# Patient Record
Sex: Female | Born: 1935 | Race: White | Hispanic: No | Marital: Married | Smoking: Former smoker
Health system: Southern US, Community
[De-identification: ages and names within clinical notes are randomized; demographics above are authoritative.]

## PROBLEM LIST (undated history)

## (undated) DIAGNOSIS — J189 Pneumonia, unspecified organism: Secondary | ICD-10-CM

## (undated) DIAGNOSIS — R062 Wheezing: Secondary | ICD-10-CM

## (undated) DIAGNOSIS — R609 Edema, unspecified: Secondary | ICD-10-CM

## (undated) DIAGNOSIS — N189 Chronic kidney disease, unspecified: Secondary | ICD-10-CM

## (undated) DIAGNOSIS — I499 Cardiac arrhythmia, unspecified: Secondary | ICD-10-CM

## (undated) DIAGNOSIS — R519 Headache, unspecified: Secondary | ICD-10-CM

## (undated) DIAGNOSIS — M1990 Unspecified osteoarthritis, unspecified site: Secondary | ICD-10-CM

## (undated) DIAGNOSIS — E119 Type 2 diabetes mellitus without complications: Secondary | ICD-10-CM

## (undated) DIAGNOSIS — IMO0001 Reserved for inherently not codable concepts without codable children: Secondary | ICD-10-CM

## (undated) DIAGNOSIS — I509 Heart failure, unspecified: Secondary | ICD-10-CM

## (undated) DIAGNOSIS — I10 Essential (primary) hypertension: Secondary | ICD-10-CM

## (undated) DIAGNOSIS — R51 Headache: Secondary | ICD-10-CM

## (undated) DIAGNOSIS — T4145XA Adverse effect of unspecified anesthetic, initial encounter: Secondary | ICD-10-CM

## (undated) DIAGNOSIS — E7800 Pure hypercholesterolemia, unspecified: Secondary | ICD-10-CM

## (undated) DIAGNOSIS — M539 Dorsopathy, unspecified: Secondary | ICD-10-CM

## (undated) DIAGNOSIS — J449 Chronic obstructive pulmonary disease, unspecified: Secondary | ICD-10-CM

## (undated) DIAGNOSIS — I4891 Unspecified atrial fibrillation: Secondary | ICD-10-CM

## (undated) DIAGNOSIS — T8859XA Other complications of anesthesia, initial encounter: Secondary | ICD-10-CM

## (undated) DIAGNOSIS — J45909 Unspecified asthma, uncomplicated: Secondary | ICD-10-CM

## (undated) HISTORY — PX: TOTAL HIP ARTHROPLASTY: SHX124

## (undated) HISTORY — PX: ABDOMINAL HYSTERECTOMY: SHX81

## (undated) HISTORY — PX: SHOULDER FUSION SURGERY: SHX775

## (undated) HISTORY — DX: Chronic obstructive pulmonary disease, unspecified: J44.9

## (undated) HISTORY — PX: CATARACT EXTRACTION: SUR2

## (undated) HISTORY — PX: BACK SURGERY: SHX140

## (undated) HISTORY — PX: TOTAL KNEE ARTHROPLASTY: SHX125

## (undated) HISTORY — PX: FRACTURE SURGERY: SHX138

## (undated) HISTORY — DX: Unspecified atrial fibrillation: I48.91

## (undated) HISTORY — DX: Type 2 diabetes mellitus without complications: E11.9

## (undated) HISTORY — PX: COLONOSCOPY: SHX174

## (undated) HISTORY — PX: HERNIA REPAIR: SHX51

## (undated) HISTORY — PX: JOINT REPLACEMENT: SHX530

---

## 2004-07-22 ENCOUNTER — Ambulatory Visit: Payer: Self-pay | Admitting: Internal Medicine

## 2005-02-07 ENCOUNTER — Ambulatory Visit: Payer: Self-pay | Admitting: Physician Assistant

## 2005-03-16 ENCOUNTER — Ambulatory Visit: Payer: Self-pay | Admitting: Anesthesiology

## 2005-03-17 ENCOUNTER — Ambulatory Visit: Payer: Self-pay | Admitting: Anesthesiology

## 2005-04-07 ENCOUNTER — Ambulatory Visit: Payer: Self-pay | Admitting: Anesthesiology

## 2005-05-03 ENCOUNTER — Ambulatory Visit: Payer: Self-pay | Admitting: Anesthesiology

## 2005-06-14 ENCOUNTER — Ambulatory Visit: Payer: Self-pay | Admitting: Anesthesiology

## 2005-08-24 ENCOUNTER — Ambulatory Visit: Payer: Self-pay | Admitting: Anesthesiology

## 2005-09-08 ENCOUNTER — Ambulatory Visit: Payer: Self-pay | Admitting: Internal Medicine

## 2006-02-21 ENCOUNTER — Ambulatory Visit: Payer: Self-pay | Admitting: Anesthesiology

## 2006-06-26 ENCOUNTER — Ambulatory Visit: Payer: Self-pay | Admitting: Anesthesiology

## 2006-07-10 ENCOUNTER — Ambulatory Visit: Payer: Self-pay | Admitting: Anesthesiology

## 2006-09-11 ENCOUNTER — Ambulatory Visit: Payer: Self-pay | Admitting: Internal Medicine

## 2006-12-21 ENCOUNTER — Ambulatory Visit: Payer: Self-pay | Admitting: Anesthesiology

## 2007-01-17 ENCOUNTER — Ambulatory Visit: Payer: Self-pay | Admitting: Anesthesiology

## 2007-03-06 ENCOUNTER — Ambulatory Visit: Payer: Self-pay | Admitting: Anesthesiology

## 2007-07-03 ENCOUNTER — Ambulatory Visit: Payer: Self-pay | Admitting: Internal Medicine

## 2007-08-13 ENCOUNTER — Ambulatory Visit: Payer: Self-pay | Admitting: Internal Medicine

## 2007-09-17 ENCOUNTER — Ambulatory Visit: Payer: Self-pay | Admitting: Internal Medicine

## 2007-10-03 ENCOUNTER — Ambulatory Visit: Payer: Self-pay | Admitting: Anesthesiology

## 2008-01-14 ENCOUNTER — Ambulatory Visit: Payer: Self-pay | Admitting: Anesthesiology

## 2008-04-01 ENCOUNTER — Ambulatory Visit: Payer: Self-pay | Admitting: Anesthesiology

## 2008-06-12 ENCOUNTER — Ambulatory Visit: Payer: Self-pay | Admitting: Anesthesiology

## 2008-08-25 ENCOUNTER — Ambulatory Visit: Payer: Self-pay | Admitting: Anesthesiology

## 2008-09-18 ENCOUNTER — Ambulatory Visit: Payer: Self-pay | Admitting: Internal Medicine

## 2008-10-22 ENCOUNTER — Ambulatory Visit: Payer: Self-pay | Admitting: Anesthesiology

## 2009-01-02 ENCOUNTER — Ambulatory Visit: Payer: Self-pay | Admitting: Anesthesiology

## 2009-04-04 ENCOUNTER — Emergency Department: Payer: Self-pay | Admitting: Emergency Medicine

## 2009-05-12 ENCOUNTER — Ambulatory Visit: Payer: Self-pay | Admitting: Anesthesiology

## 2009-07-17 ENCOUNTER — Ambulatory Visit: Payer: Self-pay | Admitting: Anesthesiology

## 2009-10-02 ENCOUNTER — Ambulatory Visit: Payer: Self-pay | Admitting: Anesthesiology

## 2009-10-03 ENCOUNTER — Ambulatory Visit: Payer: Self-pay | Admitting: Internal Medicine

## 2009-10-28 ENCOUNTER — Ambulatory Visit: Payer: Self-pay | Admitting: Internal Medicine

## 2009-11-05 ENCOUNTER — Inpatient Hospital Stay (HOSPITAL_COMMUNITY): Admission: RE | Admit: 2009-11-05 | Discharge: 2009-11-13 | Payer: Self-pay | Admitting: Neurosurgery

## 2009-11-10 ENCOUNTER — Ambulatory Visit: Payer: Self-pay | Admitting: Physical Medicine & Rehabilitation

## 2009-11-13 ENCOUNTER — Inpatient Hospital Stay (HOSPITAL_COMMUNITY)
Admission: RE | Admit: 2009-11-13 | Discharge: 2009-11-24 | Payer: Self-pay | Admitting: Physical Medicine & Rehabilitation

## 2009-11-21 ENCOUNTER — Ambulatory Visit: Payer: Self-pay | Admitting: Physical Medicine & Rehabilitation

## 2010-11-03 LAB — BASIC METABOLIC PANEL
BUN: 13 mg/dL (ref 6–23)
BUN: 25 mg/dL — ABNORMAL HIGH (ref 6–23)
Chloride: 101 mEq/L (ref 96–112)
Chloride: 99 mEq/L (ref 96–112)
Creatinine, Ser: 0.88 mg/dL (ref 0.4–1.2)
GFR calc non Af Amer: >60 mL/min (ref >60)
Glucose, Bld: 93 mg/dL (ref 70–99)

## 2010-11-03 LAB — URINALYSIS, ROUTINE W REFLEX MICROSCOPIC
Bilirubin Urine: NEGATIVE
Glucose, UA: NEGATIVE mg/dL
Hgb urine dipstick: NEGATIVE
Nitrite: NEGATIVE
Specific Gravity, Urine: 1.011 (ref 1.005–1.030)
Urobilinogen, UA: 1 mg/dL (ref 0.0–1.0)

## 2010-11-03 LAB — DIFFERENTIAL
Basophils Absolute: 0 10*3/uL (ref 0.0–0.1)
Basophils Relative: 1 % (ref 0–1)
Eosinophils Absolute: 0.3 10*3/uL (ref 0.0–0.7)
Eosinophils Relative: 5 % (ref 0–5)
Lymphocytes Relative: 20 % (ref 12–46)
Lymphs Abs: 1.4 10*3/uL (ref 0.7–4.0)
Monocytes Absolute: 0.5 10*3/uL (ref 0.1–1.0)
Monocytes Relative: 7 % (ref 3–12)

## 2010-11-03 LAB — URINE CULTURE

## 2010-11-03 LAB — COMPREHENSIVE METABOLIC PANEL
Alkaline Phosphatase: 99 U/L (ref 39–117)
CO2: 25 mEq/L (ref 19–32)
Chloride: 100 mEq/L (ref 96–112)
GFR calc Af Amer: >60 mL/min (ref >60)
GFR calc non Af Amer: >60 mL/min (ref >60)

## 2010-11-03 LAB — CBC: Platelets: 367 10*3/uL (ref 150–400)

## 2010-11-08 LAB — BASIC METABOLIC PANEL
BUN: 14 mg/dL (ref 6–23)
BUN: 14 mg/dL (ref 6–23)
BUN: 15 mg/dL (ref 6–23)
BUN: 28 mg/dL — ABNORMAL HIGH (ref 6–23)
CO2: 25 mEq/L (ref 19–32)
CO2: 26 mEq/L (ref 19–32)
CO2: 26 mEq/L (ref 19–32)
CO2: 28 mEq/L (ref 19–32)
Calcium: 10.1 mg/dL (ref 8.4–10.5)
Calcium: 7.8 mg/dL — ABNORMAL LOW (ref 8.4–10.5)
Calcium: 8.5 mg/dL (ref 8.4–10.5)
Calcium: 8.7 mg/dL (ref 8.4–10.5)
Chloride: 102 mEq/L (ref 96–112)
Chloride: 103 mEq/L (ref 96–112)
Chloride: 104 mEq/L (ref 96–112)
Chloride: 97 mEq/L (ref 96–112)
Creatinine, Ser: 0.74 mg/dL (ref 0.4–1.2)
Creatinine, Ser: 0.8 mg/dL (ref 0.4–1.2)
Creatinine, Ser: 0.89 mg/dL (ref 0.4–1.2)
Creatinine, Ser: 1.1 mg/dL (ref 0.4–1.2)
GFR calc Af Amer: 59 mL/min — ABNORMAL LOW (ref >60)
GFR calc Af Amer: >60 mL/min (ref >60)
GFR calc Af Amer: >60 mL/min (ref >60)
GFR calc Af Amer: >60 mL/min (ref >60)
GFR calc non Af Amer: 49 mL/min — ABNORMAL LOW (ref >60)
GFR calc non Af Amer: >60 mL/min (ref >60)
GFR calc non Af Amer: >60 mL/min (ref >60)
GFR calc non Af Amer: >60 mL/min (ref >60)
Glucose, Bld: 101 mg/dL — ABNORMAL HIGH (ref 70–99)
Glucose, Bld: 108 mg/dL — ABNORMAL HIGH (ref 70–99)
Glucose, Bld: 127 mg/dL — ABNORMAL HIGH (ref 70–99)
Glucose, Bld: 81 mg/dL (ref 70–99)
Potassium: 3.3 mEq/L — ABNORMAL LOW (ref 3.5–5.1)
Potassium: 3.6 mEq/L (ref 3.5–5.1)
Potassium: 4.1 mEq/L (ref 3.5–5.1)
Potassium: 4.3 mEq/L (ref 3.5–5.1)
Sodium: 131 mEq/L — ABNORMAL LOW (ref 135–145)
Sodium: 135 mEq/L (ref 135–145)
Sodium: 135 mEq/L (ref 135–145)
Sodium: 136 mEq/L (ref 135–145)

## 2010-11-08 LAB — URINE CULTURE
Colony Count: NO GROWTH
Culture: NO GROWTH

## 2010-11-08 LAB — CBC
HCT: 21.7 % — ABNORMAL LOW (ref 36.0–46.0)
HCT: 26 % — ABNORMAL LOW (ref 36.0–46.0)
HCT: 26.9 % — ABNORMAL LOW (ref 36.0–46.0)
HCT: 27.8 % — ABNORMAL LOW (ref 36.0–46.0)
HCT: 30.3 % — ABNORMAL LOW (ref 36.0–46.0)
HCT: 36.8 % (ref 36.0–46.0)
Hemoglobin: 10.5 g/dL — ABNORMAL LOW (ref 12.0–15.0)
Hemoglobin: 13 g/dL (ref 12.0–15.0)
Hemoglobin: 9.5 g/dL — ABNORMAL LOW (ref 12.0–15.0)
Hemoglobin: 9.7 g/dL — ABNORMAL LOW (ref 12.0–15.0)
MCHC: 34.7 g/dL (ref 30.0–36.0)
MCHC: 35 g/dL (ref 30.0–36.0)
MCHC: 35.2 g/dL (ref 30.0–36.0)
MCHC: 35.2 g/dL (ref 30.0–36.0)
MCHC: 35.2 g/dL (ref 30.0–36.0)
MCHC: 35.4 g/dL (ref 30.0–36.0)
MCV: 95.3 fL (ref 78.0–100.0)
MCV: 95.5 fL (ref 78.0–100.0)
MCV: 96.6 fL (ref 78.0–100.0)
MCV: 98 fL (ref 78.0–100.0)
MCV: 98.9 fL (ref 78.0–100.0)
MCV: 99.2 fL (ref 78.0–100.0)
Platelets: 162 10*3/uL (ref 150–400)
Platelets: 182 10*3/uL (ref 150–400)
Platelets: 186 10*3/uL (ref 150–400)
Platelets: 208 10*3/uL (ref 150–400)
Platelets: 259 10*3/uL (ref 150–400)
Platelets: 271 10*3/uL (ref 150–400)
RBC: 2.11 MIL/uL — ABNORMAL LOW (ref 3.87–5.11)
RBC: 2.73 MIL/uL — ABNORMAL LOW (ref 3.87–5.11)
RBC: 2.92 MIL/uL — ABNORMAL LOW (ref 3.87–5.11)
RBC: 3.13 MIL/uL — ABNORMAL LOW (ref 3.87–5.11)
RBC: 3.72 MIL/uL — ABNORMAL LOW (ref 3.87–5.11)
RDW: 13.1 % (ref 11.5–15.5)
RDW: 13.4 % (ref 11.5–15.5)
RDW: 13.6 % (ref 11.5–15.5)
RDW: 13.7 % (ref 11.5–15.5)
RDW: 14.7 % (ref 11.5–15.5)
RDW: 14.9 % (ref 11.5–15.5)
RDW: 15.2 % (ref 11.5–15.5)
WBC: 6.3 10*3/uL (ref 4.0–10.5)
WBC: 6.4 10*3/uL (ref 4.0–10.5)
WBC: 6.6 10*3/uL (ref 4.0–10.5)
WBC: 7.9 10*3/uL (ref 4.0–10.5)
WBC: 9.3 10*3/uL (ref 4.0–10.5)
WBC: 9.5 10*3/uL (ref 4.0–10.5)

## 2010-11-08 LAB — DIFFERENTIAL
Basophils Absolute: 0 10*3/uL (ref 0.0–0.1)
Basophils Absolute: 0 10*3/uL (ref 0.0–0.1)
Basophils Absolute: 0 10*3/uL (ref 0.0–0.1)
Basophils Absolute: 0.1 10*3/uL (ref 0.0–0.1)
Basophils Relative: 0 % (ref 0–1)
Basophils Relative: 0 % (ref 0–1)
Basophils Relative: 0 % (ref 0–1)
Basophils Relative: 1 % (ref 0–1)
Basophils Relative: 1 % (ref 0–1)
Eosinophils Absolute: 0.1 10*3/uL (ref 0.0–0.7)
Eosinophils Absolute: 0.2 10*3/uL (ref 0.0–0.7)
Eosinophils Absolute: 0.2 10*3/uL (ref 0.0–0.7)
Eosinophils Absolute: 0.3 10*3/uL (ref 0.0–0.7)
Eosinophils Relative: 2 % (ref 0–5)
Eosinophils Relative: 2 % (ref 0–5)
Eosinophils Relative: 4 % (ref 0–5)
Eosinophils Relative: 5 % (ref 0–5)
Lymphocytes Relative: 14 % (ref 12–46)
Lymphocytes Relative: 9 % — ABNORMAL LOW (ref 12–46)
Lymphocytes Relative: 9 % — ABNORMAL LOW (ref 12–46)
Lymphs Abs: 0.6 10*3/uL — ABNORMAL LOW (ref 0.7–4.0)
Lymphs Abs: 0.6 10*3/uL — ABNORMAL LOW (ref 0.7–4.0)
Lymphs Abs: 0.8 10*3/uL (ref 0.7–4.0)
Lymphs Abs: 0.9 10*3/uL (ref 0.7–4.0)
Monocytes Absolute: 0.4 10*3/uL (ref 0.1–1.0)
Monocytes Absolute: 0.4 10*3/uL (ref 0.1–1.0)
Monocytes Absolute: 0.5 10*3/uL (ref 0.1–1.0)
Monocytes Relative: 6 % (ref 3–12)
Monocytes Relative: 6 % (ref 3–12)
Monocytes Relative: 7 % (ref 3–12)
Neutro Abs: 4.7 10*3/uL (ref 1.7–7.7)
Neutro Abs: 5.4 10*3/uL (ref 1.7–7.7)
Neutro Abs: 7.1 10*3/uL (ref 1.7–7.7)
Neutro Abs: 7.8 10*3/uL — ABNORMAL HIGH (ref 1.7–7.7)
Neutrophils Relative %: 73 % (ref 43–77)
Neutrophils Relative %: 81 % — ABNORMAL HIGH (ref 43–77)
Neutrophils Relative %: 84 % — ABNORMAL HIGH (ref 43–77)
Neutrophils Relative %: 87 % — ABNORMAL HIGH (ref 43–77)
Neutrophils Relative %: 88 % — ABNORMAL HIGH (ref 43–77)

## 2010-11-08 LAB — URINALYSIS, ROUTINE W REFLEX MICROSCOPIC
Bilirubin Urine: NEGATIVE
Glucose, UA: NEGATIVE mg/dL
Ketones, ur: NEGATIVE mg/dL
Nitrite: NEGATIVE
Protein, ur: NEGATIVE mg/dL
Specific Gravity, Urine: 1.01 (ref 1.005–1.030)
Urobilinogen, UA: 0.2 mg/dL (ref 0.0–1.0)
pH: 6 (ref 5.0–8.0)

## 2010-11-08 LAB — URINE MICROSCOPIC-ADD ON

## 2010-11-08 LAB — BRAIN NATRIURETIC PEPTIDE: Pro B Natriuretic peptide (BNP): 223 pg/mL — ABNORMAL HIGH (ref 0.0–100.0)

## 2010-11-08 LAB — MRSA PCR SCREENING: MRSA by PCR: NEGATIVE

## 2010-11-08 LAB — CROSSMATCH: Antibody Screen: NEGATIVE

## 2010-11-08 LAB — GLUCOSE, CAPILLARY
Glucose-Capillary: 110 mg/dL — ABNORMAL HIGH (ref 70–99)
Glucose-Capillary: 515 mg/dL — ABNORMAL HIGH (ref 70–99)

## 2010-11-08 LAB — ABO/RH: ABO/RH(D): A POS

## 2010-11-08 LAB — TYPE AND SCREEN
ABO/RH(D): A POS
Antibody Screen: NEGATIVE

## 2010-11-08 LAB — CULTURE, BLOOD (ROUTINE X 2): Culture: NO GROWTH

## 2010-11-08 LAB — SURGICAL PCR SCREEN
MRSA, PCR: NEGATIVE
Staphylococcus aureus: NEGATIVE

## 2011-07-28 ENCOUNTER — Ambulatory Visit: Payer: Self-pay | Admitting: Internal Medicine

## 2011-12-02 ENCOUNTER — Ambulatory Visit: Payer: Self-pay | Admitting: Gastroenterology

## 2011-12-06 LAB — PATHOLOGY REPORT

## 2011-12-08 ENCOUNTER — Emergency Department: Payer: Self-pay | Admitting: Emergency Medicine

## 2013-02-13 ENCOUNTER — Inpatient Hospital Stay: Payer: Self-pay | Admitting: Internal Medicine

## 2013-02-13 LAB — COMPREHENSIVE METABOLIC PANEL
Albumin: 4.2 g/dL (ref 3.4–5.0)
Alkaline Phosphatase: 86 U/L (ref 50–136)
BUN: 21 mg/dL — ABNORMAL HIGH (ref 7–18)
Creatinine: 1.07 mg/dL (ref 0.60–1.30)
EGFR (Non-African Amer.): 50 — ABNORMAL LOW
Glucose: 133 mg/dL — ABNORMAL HIGH (ref 65–99)
SGOT(AST): 30 U/L (ref 15–37)
SGPT (ALT): 31 U/L (ref 12–78)

## 2013-02-13 LAB — URINALYSIS, COMPLETE
Bacteria: NONE SEEN
Glucose,UR: NEGATIVE mg/dL (ref 0–75)
Ketone: NEGATIVE
Nitrite: NEGATIVE
Specific Gravity: 1.02 (ref 1.003–1.030)
Squamous Epithelial: NONE SEEN
WBC UR: 3 /HPF (ref 0–5)

## 2013-02-13 LAB — CBC
HGB: 16.6 g/dL — ABNORMAL HIGH (ref 12.0–16.0)
MCH: 33.2 pg (ref 26.0–34.0)
MCV: 95 fL (ref 80–100)

## 2013-02-13 LAB — LIPASE, BLOOD: Lipase: 125 U/L (ref 73–393)

## 2013-02-14 LAB — CBC WITH DIFFERENTIAL/PLATELET
Basophil %: 0.2 %
Eosinophil #: 0 10*3/uL (ref 0.0–0.7)
Lymphocyte #: 1 10*3/uL (ref 1.0–3.6)
Lymphocyte %: 11.9 %
Monocyte #: 0.5 x10 3/mm (ref 0.2–0.9)
Neutrophil #: 6.6 10*3/uL — ABNORMAL HIGH (ref 1.4–6.5)

## 2013-02-14 LAB — BASIC METABOLIC PANEL
BUN: 18 mg/dL (ref 7–18)
Chloride: 97 mmol/L — ABNORMAL LOW (ref 98–107)
Co2: 33 mmol/L — ABNORMAL HIGH (ref 21–32)
EGFR (Non-African Amer.): 57 — ABNORMAL LOW
Glucose: 120 mg/dL — ABNORMAL HIGH (ref 65–99)
Osmolality: 277 (ref 275–301)
Potassium: 3.2 mmol/L — ABNORMAL LOW (ref 3.5–5.1)

## 2013-02-15 LAB — BASIC METABOLIC PANEL
BUN: 15 mg/dL (ref 7–18)
EGFR (African American): >60
Glucose: 109 mg/dL — ABNORMAL HIGH (ref 65–99)
Osmolality: 268 (ref 275–301)
Potassium: 3.4 mmol/L — ABNORMAL LOW (ref 3.5–5.1)
Sodium: 133 mmol/L — ABNORMAL LOW (ref 136–145)

## 2013-02-15 LAB — CBC WITH DIFFERENTIAL/PLATELET
Eosinophil #: 0.1 10*3/uL (ref 0.0–0.7)
Eosinophil %: 0.8 %
HCT: 37.6 % (ref 35.0–47.0)
HGB: 13.3 g/dL (ref 12.0–16.0)
Lymphocyte #: 0.8 10*3/uL — ABNORMAL LOW (ref 1.0–3.6)
MCH: 33.9 pg (ref 26.0–34.0)
MCHC: 35.4 g/dL (ref 32.0–36.0)
MCV: 96 fL (ref 80–100)
Monocyte %: 6.4 %
Neutrophil #: 5.5 10*3/uL (ref 1.4–6.5)
Neutrophil %: 80.4 %
RBC: 3.92 10*6/uL (ref 3.80–5.20)
RDW: 13.3 % (ref 11.5–14.5)

## 2013-02-16 LAB — BASIC METABOLIC PANEL
Anion Gap: 6 — ABNORMAL LOW (ref 7–16)
BUN: 14 mg/dL (ref 7–18)
Calcium, Total: 8.2 mg/dL — ABNORMAL LOW (ref 8.5–10.1)
Chloride: 101 mmol/L (ref 98–107)
EGFR (African American): >60
EGFR (Non-African Amer.): >60
Glucose: 118 mg/dL — ABNORMAL HIGH (ref 65–99)
Potassium: 4.7 mmol/L (ref 3.5–5.1)

## 2013-02-16 LAB — CBC WITH DIFFERENTIAL/PLATELET
Basophil #: 0 10*3/uL (ref 0.0–0.1)
HGB: 12.4 g/dL (ref 12.0–16.0)
Lymphocyte %: 6.7 %
Monocyte #: 0.5 x10 3/mm (ref 0.2–0.9)
Monocyte %: 7.4 %
Neutrophil %: 85.7 %
RBC: 3.66 10*6/uL — ABNORMAL LOW (ref 3.80–5.20)
RDW: 13.3 % (ref 11.5–14.5)
WBC: 7.3 10*3/uL (ref 3.6–11.0)

## 2013-02-17 LAB — CBC WITH DIFFERENTIAL/PLATELET
Basophil #: 0 10*3/uL (ref 0.0–0.1)
Basophil %: 0.4 %
HGB: 12.2 g/dL (ref 12.0–16.0)
Lymphocyte #: 0.7 10*3/uL — ABNORMAL LOW (ref 1.0–3.6)
Lymphocyte %: 12.9 %
MCH: 33.5 pg (ref 26.0–34.0)
MCHC: 35.5 g/dL (ref 32.0–36.0)
Monocyte #: 0.5 x10 3/mm (ref 0.2–0.9)
Platelet: 160 10*3/uL (ref 150–440)
RDW: 13.3 % (ref 11.5–14.5)

## 2013-02-18 LAB — CBC WITH DIFFERENTIAL/PLATELET
Basophil #: 0 10*3/uL (ref 0.0–0.1)
Basophil %: 0.3 %
Eosinophil %: 4.7 %
HCT: 32.9 % — ABNORMAL LOW (ref 35.0–47.0)
Lymphocyte #: 0.6 10*3/uL — ABNORMAL LOW (ref 1.0–3.6)
Lymphocyte %: 10.9 %
MCH: 34.2 pg — ABNORMAL HIGH (ref 26.0–34.0)
Monocyte #: 0.5 x10 3/mm (ref 0.2–0.9)
Neutrophil %: 75.6 %
RDW: 13.2 % (ref 11.5–14.5)
WBC: 5.5 10*3/uL (ref 3.6–11.0)

## 2013-02-18 LAB — BASIC METABOLIC PANEL
Calcium, Total: 8.2 mg/dL — ABNORMAL LOW (ref 8.5–10.1)
Creatinine: 0.83 mg/dL (ref 0.60–1.30)
EGFR (Non-African Amer.): >60
Glucose: 92 mg/dL (ref 65–99)
Osmolality: 264 (ref 275–301)
Sodium: 133 mmol/L — ABNORMAL LOW (ref 136–145)

## 2013-09-17 ENCOUNTER — Ambulatory Visit: Payer: Self-pay | Admitting: Cardiology

## 2013-12-31 ENCOUNTER — Ambulatory Visit: Payer: Self-pay | Admitting: Ophthalmology

## 2014-01-17 ENCOUNTER — Ambulatory Visit: Payer: Self-pay | Admitting: Nephrology

## 2014-02-10 ENCOUNTER — Inpatient Hospital Stay: Payer: Self-pay | Admitting: Internal Medicine

## 2014-02-10 LAB — COMPREHENSIVE METABOLIC PANEL
ALK PHOS: 79 U/L
Albumin: 2.8 g/dL — ABNORMAL LOW (ref 3.4–5.0)
Anion Gap: 8 (ref 7–16)
BILIRUBIN TOTAL: 0.7 mg/dL (ref 0.2–1.0)
BUN: 30 mg/dL — ABNORMAL HIGH (ref 7–18)
CALCIUM: 8.4 mg/dL — AB (ref 8.5–10.1)
Chloride: 108 mmol/L — ABNORMAL HIGH (ref 98–107)
Co2: 25 mmol/L (ref 21–32)
Creatinine: 1.32 mg/dL — ABNORMAL HIGH (ref 0.60–1.30)
EGFR (African American): 45 — ABNORMAL LOW
EGFR (Non-African Amer.): 39 — ABNORMAL LOW
GLUCOSE: 223 mg/dL — AB (ref 65–99)
Osmolality: 294 (ref 275–301)
POTASSIUM: 3.6 mmol/L (ref 3.5–5.1)
SGOT(AST): 49 U/L — ABNORMAL HIGH (ref 15–37)
SGPT (ALT): 79 U/L — ABNORMAL HIGH (ref 12–78)
SODIUM: 141 mmol/L (ref 136–145)
Total Protein: 6.3 g/dL — ABNORMAL LOW (ref 6.4–8.2)

## 2014-02-10 LAB — PROTIME-INR
INR: 2.3
PROTHROMBIN TIME: 24.4 s — AB (ref 11.5–14.7)

## 2014-02-10 LAB — TROPONIN I
Troponin-I: 0.03 ng/mL
Troponin-I: 0.04 ng/mL
Troponin-I: 0.02 ng/mL

## 2014-02-10 LAB — CK TOTAL AND CKMB (NOT AT ARMC)
CK, Total: 115 U/L
CK, Total: 99 U/L
CK, Total: 96 U/L
CK-MB: 3.6 ng/mL (ref 0.5–3.6)
CK-MB: 3.8 ng/mL — ABNORMAL HIGH (ref 0.5–3.6)
CK-MB: 3.3 ng/mL (ref 0.5–3.6)

## 2014-02-10 LAB — APTT: Activated PTT: 47.2 secs — ABNORMAL HIGH (ref 23.6–35.9)

## 2014-02-10 LAB — CBC
HCT: 43.7 % (ref 35.0–47.0)
HGB: 14.3 g/dL (ref 12.0–16.0)
MCH: 32.8 pg (ref 26.0–34.0)
MCHC: 32.6 g/dL (ref 32.0–36.0)
MCV: 101 fL — AB (ref 80–100)
Platelet: 187 10*3/uL (ref 150–440)
RBC: 4.35 10*6/uL (ref 3.80–5.20)
RDW: 15.1 % — ABNORMAL HIGH (ref 11.5–14.5)
WBC: 6.3 10*3/uL (ref 3.6–11.0)

## 2014-02-10 LAB — PRO B NATRIURETIC PEPTIDE: B-Type Natriuretic Peptide: 3020 pg/mL — ABNORMAL HIGH (ref 0–450)

## 2014-02-11 LAB — CBC WITH DIFFERENTIAL/PLATELET
Basophil #: 0.1 10*3/uL (ref 0.0–0.1)
Basophil %: 0.8 %
Eosinophil #: 0.2 10*3/uL (ref 0.0–0.7)
Eosinophil %: 2.7 %
HCT: 44.5 % (ref 35.0–47.0)
HGB: 14.6 g/dL (ref 12.0–16.0)
LYMPHS ABS: 1.2 10*3/uL (ref 1.0–3.6)
Lymphocyte %: 18.9 %
MCH: 32.9 pg (ref 26.0–34.0)
MCHC: 32.7 g/dL (ref 32.0–36.0)
MCV: 100 fL (ref 80–100)
Monocyte #: 0.4 x10 3/mm (ref 0.2–0.9)
Monocyte %: 5.9 %
Neutrophil #: 4.5 10*3/uL (ref 1.4–6.5)
Neutrophil %: 71.7 %
Platelet: 188 10*3/uL (ref 150–440)
RBC: 4.43 10*6/uL (ref 3.80–5.20)
RDW: 14.9 % — ABNORMAL HIGH (ref 11.5–14.5)
WBC: 6.3 10*3/uL (ref 3.6–11.0)

## 2014-02-11 LAB — HEPATIC FUNCTION PANEL A (ARMC)
ALT: 94 U/L — AB (ref 12–78)
AST: 55 U/L — AB (ref 15–37)
Albumin: 2.9 g/dL — ABNORMAL LOW (ref 3.4–5.0)
Alkaline Phosphatase: 74 U/L
BILIRUBIN DIRECT: 0.2 mg/dL (ref 0.00–0.20)
BILIRUBIN TOTAL: 0.9 mg/dL (ref 0.2–1.0)
Total Protein: 6.4 g/dL (ref 6.4–8.2)

## 2014-02-11 LAB — BASIC METABOLIC PANEL
Anion Gap: 7 (ref 7–16)
BUN: 35 mg/dL — AB (ref 7–18)
Calcium, Total: 8.8 mg/dL (ref 8.5–10.1)
Chloride: 107 mmol/L (ref 98–107)
Co2: 26 mmol/L (ref 21–32)
Creatinine: 1.23 mg/dL (ref 0.60–1.30)
EGFR (African American): 49 — ABNORMAL LOW
EGFR (Non-African Amer.): 42 — ABNORMAL LOW
Glucose: 193 mg/dL — ABNORMAL HIGH (ref 65–99)
Osmolality: 293 (ref 275–301)
Potassium: 3.5 mmol/L (ref 3.5–5.1)
Sodium: 140 mmol/L (ref 136–145)

## 2014-02-11 LAB — PROTIME-INR
INR: 1.5
Prothrombin Time: 18 secs — ABNORMAL HIGH (ref 11.5–14.7)

## 2014-12-05 NOTE — Consult Note (Signed)
PATIENT NAME:  Allison Shepard, Allison M MR#:  [id number] DATE OF BIRTH:  08-May-1934  DATE OF CONSULTATION:  02/14/2013  REFERRING PHYSICIAN:  Quentin Orealph L. Ely, III, MD  CONSULTING PHYSICIAN:  Eily Louvier S. Sherryll BurgerShah, MD PRIMARY CARE PHYSICIAN: Allison Dearavid N. Harrington Challengerhies, MD    REASON FOR CONSULTATION: Medical management.   HISTORY OF PRESENT ILLNESS: The patient is a 81 year old female with a known history of hypertension, hyperlipidemia, is admitted under the surgical service for partial small bowel obstruction. We are consulted for medical management. The patient has a known history of hypertension for which she is taking atenolol, hydrochlorothiazide and quinapril.  Her blood pressure seems fairly well controlled. She is currently n.p.o. and has an NG tube in for decompression. She denies any symptoms other than minimal pain around her stomach area. She has had vomiting x 1 this morning. She had multiple vomitus yesterday. Her pain seems to be a little better controlled than yesterday, and she was quite sleepy when I evaluated her.   PAST MEDICAL HISTORY: 1.  Hypertension.  2.  Hyperlipidemia.   ALLERGIES: PERCOCET, WHICH CAUSES HER TO BREAK OUT.   SOCIAL HISTORY: She is a former smoker, stopped in 1993. Occasional alcohol.   MEDICATIONS AT HOME: 1.  Aspirin 325 mg p.o. b.i.d.  2.  Atenolol 25 mg p.o. daily.  3.  Calcium carbonate with vitamin D 1 tablet p.o. daily.  4.  Detrol 1 mg p.o. at bedtime.  5.  Fish oil 500 mg 2 capsules p.o. daily.  6.  Hydrochlorothiazide 25 mg p.o. daily. 7.  Quinapril 0.4 mg p.o. daily.  8.  Vitamin D3 2000 international units once daily.  9.  Zocor 40 mg p.o. daily.   FAMILY HISTORY: Father had heart disease.  Mother had pernicious anemia.  REVIEW OF SYSTEMS: CONSTITUTIONAL: No fever. Positive fatigue and weakness.  EYES: No blurred or double vision.  ENT: No tinnitus or ear pain.  RESPIRATORY: No cough, wheezing, hemoptysis.  CARDIOVASCULAR: No chest pain, orthopnea, edema.  She does have history of murmur.  GASTROINTESTINAL: Positive for nausea, vomiting and constipation with now partial small bowel obstruction and abdominal pain.  GENITOURINARY: No dysuria or hematuria.  ENDOCRINE: No polyuria, nocturia, hematology. HEMATOLOGY: No anemia or easy bruising.  SKIN: No rash or lesion.  MUSCULOSKELETAL: No arthritis or muscle cramp.  NEUROLOGIC: Tingling, numbness, weakness.  PSYCHIATRIC: No history of anxiety or depression.   PHYSICAL EXAMINATION: VITAL SIGNS: Temperature 98.2, heart rate 72 per minute.  GENERAL: Respirations 20 per minute, blood pressure 125/68 mmHg. She is saturating 80 to 90% on room air.  GENERAL: The patient is a 81 year old female lying in the bed comfortably without any acute distress.  HEENT: Eyes: Pupils are equal, round, reactive to light and accommodation.  No scleral icterus. Extraocular muscles intact. HENT: Head atraumatic, normocephalic.  Oropharynx and nasopharynx clear. The patient has an NG tube in place on intermittent suction.  NECK: Supple. No jugular venous distention. No thyroid enlargement or tenderness.  LUNGS: Clear to auscultation bilaterally. No wheezing, rales, rhonchi or crepitation.  CARDIOVASCULAR: S1, S2 normal. She has a 2/6 systolic ejection murmur heard at the left sternal border.  ABDOMEN: Soft with mild generalized lower abdominal tenderness. No rebound or guarding. She does have hypoactive bowel sounds. No hernias or masses appreciated.   EXTREMITIES: No pedal edema, cyanosis, clubbing. Normal range of motion of all joints.  PSYCHIATRIC: The patient is alert and oriented x 3. SKIN:  She has minimal facial erythema with some skin changes  but no deformity.  No major ulcers or rash anywhere else on the body.   LABORATORY AND RADIOLOGICAL DATA:  Normal BMP, except potassium of 3.2. Normal magnesium. Normal CBC. Normal liver function tests. Negative urinalysis.  Abdominal flat and erect x-ray on 07/03 showed  unchanged mild small bowel distention, likely persistent small bowel junction.   IMPRESSION AND PLAN: 1.  Hypertension:  Her blood pressure seems to be well controlled at this time.  I do not recommend starting her blood pressure medication.  She is n.p.o., and we will watch her while she is n.p.o.  Can use IV blood pressure medication if required. We will closely monitor and follow throughout her hospital course.  2.  Hyperlipidemia:  We will hold Zocor while she is n.p.o.  3.  Hypokalemia:  She is being given potassium IV.  We will check magnesium.  The result  just came back and seems to be normal.  4.  Minimal hypoxia:  I would just watch her.  This is likely due to her not being able to take deep breath.  If required, we can put her on 1 to 2 liters oxygen.  5.  Partial small bowel obstruction:  Further management per Surgery, for the time being conservative management recommended along with pain management.   CODE STATUS:  FULL CODE.    TOTAL TIME TAKING CARE OF THIS PATIENT: 45 minutes.   ____________________________ Ellamae Sia. Sherryll Burger, MD vss:cb D: 02/14/2013 14:38:07 ET T: 02/14/2013 16:05:00 ET JOB#: [id number]  cc: Jannis Atkins S. Sherryll Burger, MD, <Dictator> Allison Dear. Harrington Challenger, MD Carmie End, MD Ellamae Sia [facility] MD ELECTRONICALLY SIGNED 02/18/2013 9:36

## 2014-12-05 NOTE — H&P (Signed)
PATIENT NAME:  Allison Shepard, Allison Shepard MR#:  [id number] DATE OF BIRTH:  Sep 21, 1933  DATE OF ADMISSION:  02/13/2013  PRIMARY CARE PHYSICIAN:  Dr. Mickey Farber.   CHIEF COMPLAINT:  Abdominal pain, nausea and vomiting.   HISTORY OF PRESENT ILLNESS:  Allison Shepard is a 81 year old woman seen in the Emergency Room on referral from her primary care physician, Dr. Mickey Farber.  She suffered sudden onset of abdominal pain last evening in the periumbilical, lower abdominal area.  The pain was intense and then accompanied by profound nausea and vomiting.  She vomited over the course of the evening, vomited this morning, vomited at the primary care physician's office.  Initial work-up with plain films there suggested a partial bowel obstruction.  The patient is referred to the Emergency Room for further evaluation.  CT scan performed in the Emergency Room demonstrated what appeared to be a high-grade partial small bowel obstruction.  The patient's surgeon Dr. Renda Rolls at [facility] was consulted and he recommended evaluation by the hospital-based surgical group.   She denies any previous similar symptoms.  However, she does have a history of long-standing constipation issues.  She moves her bowels approximately once a week after taking cathartics.  She denies history of hepatitis, yellow jaundice, pancreatitis, peptic ulcer disease, gallbladder disease or diverticulitis.  Only previous intra-abdominal surgery was hysterectomy.  She did have a ventral hernia repair, but the procedure was performed in the preperitoneal space.  There were no peritoneal placement of mesh during that procedure.  She has not had any previous similar symptoms.   She has multiple previous orthopedic procedures including a left shoulder surgery, spinal stenosis surgery, right forearm surgery, right hip replacement and right knee replacement.  She has a long-standing history of chronic back pain and is treated at the pain center for a  number of years.  She denies a history of cardiac disease, diabetes or thyroid disease.  She is hypertensive, currently on medication, followed by Dr. Harrington Challenger in [place] on a regular basis.   CURRENT MEDICATIONS:  Include aspirin 325 mg by mouth twice daily, atenolol 25 mg by mouth once a day, Calcar 600/400 once a day, Detrol 1 mg once a day, fish oil twice a day, hydrochlorothiazide 25 mg once a day, quinapril 40 mg once a day, Zocor 40 mg once a day.   ALLERGIES:  She is allergic to PERCOCET WHICH CAUSES HER TO BREAK OUT.   SOCIAL HABITS:  She is a reformed cigarette smoker and drinks alcohol regularly, but on a very minimal basis.   REVIEW OF SYSTEMS:  Otherwise unremarkable.  Her urinary symptoms that she has complained of for a number of years have been significantly improved with the Detrol.   PHYSICAL EXAMINATION: GENERAL:  She is alert, relatively comfortable woman in no significant distress.  VITAL SIGNS:  Blood pressure 114/50, heart rate 78.  Oxygen saturation is 96%.  She is afebrile.  HEENT:  Some facial erythematous changes with some skin lesions, but no facial deformity.  No pupillary abnormalities.  No scleral icterus.  NECK:  Supple, nontender with a trachea and no adenopathy.  LUNGS:  Clear bilaterally with no adventitious sounds.  She has normal pulmonary excursion.  CARDIAC:  Reveals a 2 over 6 systolic murmur heard best along the left sternal border and she seems to be in normal sinus rhythm.  ABDOMEN:  Soft with some generalized lower abdominal tenderness.  She has no rebound or guarding.  She has hypoactive bowel sounds.  No hernias are identified.  EXTREMITIES:  Reveals full range of motion.  No deformities.  Good distal pulses.  PSYCHIATRIC:  Reveals some moderate dementia as she keeps asking the same questions over and over again.  She otherwise appears well-oriented to the situation.   LABORATORY AND IMAGING STUDIES:  I have independently reviewed her CT scan.  She does  appear to have a significant sized mismatch in her small bowel suggestive of bowel obstruction.  She has a significant amount of stool in the colon consistent with her known constipation.  Laboratory values reveal a BUN of 21, creatinine 1.07, serum sodium 135, potassium 3.7, chloride of 96.  White blood cell count is elevated at 15,100, hemoglobin 16.6, platelet count is 237,000.    Her clinical presentation and x-ray pictures support the diagnosis of partial small bowel obstruction.  She was initially improved with nasogastric decompression.  We will admit her to the hospital for hydration, decompression and re-evaluation.  We will repeat her plain films tomorrow.  We will ask the internal medicine physicians to assist us in her management with her medications while she is nothing by mouth.  This plan has been discussed with the patient and her husband who was present for the interview.    ____________________________ Carmie Endalph L. Ely III, MD rle:ea D: 02/13/2013 18:37:14 ET T: 02/13/2013 19:03:45 ET JOB#: [id number]  cc: Carmie Endalph L. Ely III, MD, <Dictator> Neomia Dearavid N. Harrington Challengerhies, MD Quentin OreALPH L ELY MD ELECTRONICALLY SIGNED 02/15/2013 8:26

## 2014-12-05 NOTE — Consult Note (Signed)
Brief Consult Note: Diagnosis: medical management.   Patient was seen by consultant.   Consult note dictated.   Orders entered.   Comments: 1. hypertension: seems well controlled at this time, don't recommend any changes, close monitoring, if need we can use IV meds if she is NPO.  2. Hyperlipidemia: hold zocor while NPO,   3. hypokalemia: being repleted iv, check mg  4. Partial sbo: management per surgery.  Electronic Signatures: Patricia PesaShah, [employer] S (MD)  (Signed 03-Jul-14 14:23)  Authored: Brief Consult Note   Last Updated: 03-Jul-14 14:23 by Patricia PesaShah, Nurah Petrides S (MD)

## 2014-12-05 NOTE — Op Note (Signed)
PATIENT NAME:  Allison Shepard, [name] MR#:  [id number] DATE OF BIRTH:  [birth year]  DATE OF PROCEDURE:  02/15/2013  PREOPERATIVE DIAGNOSIS:  Small bowel obstruction.   POSTOPERATIVE DIAGNOSIS:  Small bowel obstruction.   OPERATION:  Laparotomy, lysis of adhesions, placement right internal jugular central venous access.   ANESTHESIA:  General.   SURGEON: Quentin Orealph L. Ely, III, MD.   OPERATIVE PROCEDURE:  With the patient in the supine position after induction of appropriate general anesthesia, the patient's abdomen was prepped with ChloraPrep and draped with sterile towels. A periumbilical incision made from just above the umbilicus to just below the umbilicus, carried down through the subcutaneous tissues with Bovie electrocautery. The midline fascia was identified and opened in line with the skin incision. There were several small adhesions to the anterior abdominal wall from the previous surgeries involving mostly the omentum. These adhesions were taken down without difficulty. There was significant distended proximal bowel and obviously markedly decompressed distal bowel. Following the bowel toward the pelvis, there was a closed loop obstruction with an adhesion from one loop of bowel to another. The bowel was dusky but appeared to be viable. The adhesion was lysed. The bowel began to pink up quickly. The bowel was run from ligament of Treitz to the ileocecal valve. No other abnormalities were identified. Some of the bowel contents were milked into the right colon. The nasogastric tube was barely into the stomach but could not be manipulated into the stomach despite multiple attempts. For that reason, it was withdrawn after decompressing the stomach of as much GI material as possible. The abdomen was then irrigated. The bowel contents were returned to their anatomic position. The midline fascia was closed with running #1 Prolene suture from each end, tied in the middle. The skin was clipped. A sterile  dressing was applied. The patient's neck was then appropriately interrogated with the ultrasound and reprepped and draped, and a right internal jugular central line placed under ultrasound guidance. The line was flushed, secured with silk and appropriately dressed. The patient was returned to the Recovery Room having tolerated the procedure well. Sponge, instrument and needle counts were correct x 2 in the Operating Room.    ____________________________ Carmie Endalph L. Ely III, MD rle:jm D: 02/15/2013 14:48:50 ET T: 02/15/2013 15:01:53 ET JOB#: [id number]  cc: Carmie Endalph L. Ely III, MD, <Dictator> Neomia Dearavid N. Harrington Challengerhies, MD Quentin OreALPH L ELY MD ELECTRONICALLY SIGNED 02/16/2013 16:43

## 2014-12-05 NOTE — Discharge Summary (Signed)
PATIENT NAME:  Allison Shepard, [name] MR#:  [id number] DATE OF BIRTH:  12-Apr-1934  DATE OF ADMISSION:  02/13/2013 DATE OF DISCHARGE:  02/20/2013  BRIEF HISTORY: Ms. Janace HoardLeingang is a 81 year old woman admitted through the Emergency Room on July 2, with what appeared to be a small bowel obstruction. She had had symptoms of abdominal pain, nausea, vomiting and decreased her bowel function over the last several days. The pain prior to admission was intense and CT scan was recommended by her primary care physician. CT scan demonstrated what appeared to be a high-grade partial bowel, small bowel obstruction. She was admitted to the hospital placed on nasogastric decompression. She is rehydrated. The internal medicine doctors assisted with her medical management. She did not improve over the next 48 hours. Plain films and clinical examination demonstrated no improvement. We repeated her CT, which did demonstrate no significant change. For that reason, we recommended surgical intervention. She was taken to surgery on the afternoon of 07/04/2014where she underwent  laparotomy and lysis of adhesions. She did have closed loop obstruction. She had very slow return of bowel function, but did improve over the next several days. She was discharged home on July 9 and followup in the office in 7 to 10 days' time. Bathing, activity, and driving instructions were given to the patient.   DISCHARGE MEDICATIONS:  Included: Atenolol 25 mg p.o. daily, hydrochlorothiazide 25 mg p.o. daily, Zocor 40 mg p.o. daily, calcium 600 mg once a day, vitamin D3, 2000 units once a day, Datril  1 mg once a day at bedtime, Percocet 5/325 every 6 hours p.r.n.   FINAL DISCHARGE DIAGNOSIS: Small bowel obstruction with lysis of adhesions.    ____________________________ Quentin Orealph L. Ely III, MD rle:cc D: 02/28/2013 20:31:22 ET T: 02/28/2013 22:25:57 ET JOB#: [id number]  cc: Carmie Endalph L. Ely III, MD, <Dictator> Neomia Dearavid N. Harrington Challengerhies, MD Quentin OreALPH L ELY  MD ELECTRONICALLY SIGNED 03/06/2013 10:14

## 2014-12-06 NOTE — Discharge Summary (Signed)
PATIENT NAME:  Allison Shepard, Avyonna M MR#:  [id number] DATE OF BIRTH:  14-Aug-1934  DATE OF ADMISSION:  02/10/2014 DATE OF DISCHARGE:  02/11/2014  ADMITTING DIAGNOSIS: Atrial fibrillation, rapid ventricular response.   DISCHARGE DIAGNOSES:  1.  Atrial fibrillation, rapid ventricular response.  2.  Acute systolic congestive heart failure.  3.  Dyspnea due to cardiomyopathy as well as congestive heart failure.  4.  Hypertension.  5.  Cardiomyopathy with ejection fraction of 30% to 35% with moderate to severe mitral regurgitation as well as moderate tricuspid regurgitation.  6.  History of chronic obstructive pulmonary disease.  7.  Hyperlipidemia.  8.  Anemia.  9.  Hypertension.  10.  Osteoporosis.  11.  Vitamin D deficiency.  12.  Proteinuria.    DISCHARGE CONDITION: Stable.   DISCHARGE MEDICATIONS: The patient is to resume her outpatient medications, which include 1.  Zocor 40 mg p.o. daily.  2.  Vitamin D3 2000 units daily.  3.  Fish oil 500 mg 2 capsules once daily.  4.  ProAir HFA 2 puffs 4 times daily as needed.  5.  Anoro Ellipta 62.5 mcg/25 mcg 1 puff once daily.  6.  Xarelto 20 mg p.o. daily.  7.  Metoprolol succinate (this is a new medication) 100 mg p.o. daily.  8.  Lisinopril 5 mg p.o. daily (this is a new dose).  9.  Diltiazem CD 180 mg p.o. daily.  10.  Furosemide 20 mg p.o. twice daily.  11.  Senna 1 tablet twice daily as needed.  12.  Docusate sodium 100 mg p.o. twice daily as needed.   The patient is not to take quinapril unless recommended by primary care physician or cardiologist.   HOME OXYGEN: None.   DIET: 2 grams salt, low fat, low cholesterol, regular consistency.   ACTIVITY LIMITATIONS: As tolerated.    FOLLOWUP APPOINTMENT: With Dr. Lady GaryFath in two days after discharge, Dr. Harrington Challengerhies in two days after discharge, and Dr. Wyn Quakerew in two days after discharge. The patient is also going to be scheduled for congestive heart failure clinic follow-up.   CONSULTANTS: Dr.  Gwen PoundsKowalski.   RADIOLOGIC STUDIES: Chest x-ray, portable single view on February 10, 2014, showed cardiomegaly and pulmonary venous hypertension. Echocardiogram done on February 10, 2014, showed a left ventricular ejection fraction by visual estimation of 30% to 35%, moderately to severely decreased global left ventricular systolic function, decreased left ventricular internal cavity size, severely dilated left atrium, moderately dilated right atrium, moderate to severe mitral valve regurgitation, moderate tricuspid regurgitation, mildly increased left ventricular posterior wall thickness.   HISTORY AND HOSPITAL COURSE: The patient is a 81 year old Caucasian female with history of chronic atrial fibrillation on chronic anticoagulation with Xarelto, who presents to the hospital with complaints of atrial fibrillation, rapid ventricular response. Please refer to Dr. Teena IraniElgergawy's admission note on February 10, 2014. Upon presentation, the patient's heart rate was in the 160s. She was given atenolol as well as metoprolol IV, with improvement of her heart rate to around 105 to 110s. She was admitted to the hospital for further evaluation.   On arrival to the hospital, temperature was 97.4, pulse was 106, as mentioned above. It decreased to 110s. Respiration rate was 20, blood pressure 145/70, saturation was 94% on 2 liters of oxygen through nasal cannula. Physical exam was remarkable for bilateral lower extremity edema to 1 to 2+. EKG showed atrial fibrillation with RVR at rate of 148 beats per minute. The patient's lab data done on arrival to the Emergency Room  showed beta-type natriuretic peptide of 3020, glucose 223, BUN and creatinine were 30 and 1.32; otherwise BMP was unremarkable. The patient's albumin level was 2.8, AST as well as ALT were 49 and 79, respectively. Cardiac enzymes x3 were within normal limits, with no troponin elevation. White blood cell count was normal at 6.3, hemoglobin was 14.8, platelet count 187, with  high MCV at 101. Coagulation panel showed pro time of 24.4, INR was 2.3. Activated PTT was 47.2. pH was 7.36, pCO2 was 45.   The patient was admitted to the hospital for further evaluation. Her cardiac enzymes were cycled x3, and she was initiated on high doses of metoprolol. The patient was also added Cardizem CD as time progressed. With this, her condition improved. Her heart rate improved to 80s to 90s. On ambulation, however, it would increase to 110s to 120s. It is recommended to advance the patient's metoprolol as well as Cardizem CD to levels so her heart rate is controlled whenever she is exerting herself. She is to follow up with a cardiologist for further recommendations. She is to continue Xarelto for her chronic atrial fibrillation.   In regard to her dyspnea, it was felt that the patient's dyspnea was likely due to cardiomyopathy as well as congestive heart failure. The patient was advised to continue a low dose of lisinopril, and diuretic Lasix was initiated upon discharge. She is to follow up with her cardiologist and make decisions about advancement of ACE inhibitor, depending on her ability to tolerate it. She is also to have her diuretics advanced or discontinued, depending on her needs. She is to follow up with congestive heart failure clinic as well for management of her dyspnea. She was ambulated on the day of discharge, and will her oxygen saturation was 96% on room air on exertion.   In regard to hypertension, the patient is to continue her outpatient medications with high doses of metoprolol as well as Cardizem and diuretic, furosemide, as well as low dose of lisinopril. Her medications should be advanced to control her heart rate as well as blood pressure, and this was communicated to the patient.   In regard to cardiomyopathy, as mentioned above, the patient is to follow up with congestive heart failure clinic as well as cardiologist for further recommendations. She is to continue  diuretics, ACE inhibitor, as well as metoprolol for heart rate control. On the day of discharge, the patient's vital signs were stable, with temperature of 97.5, pulse was ranging from 80 at rest as well as to 110s to 120s on exertion. Respiratory rate was 18, blood pressure 129/90, saturation was 95% on room air at rest as well as on exertion.   TIME SPENT: 40 minutes.   ____________________________ Katharina Caper, MD rv:cg D: 02/11/2014 16:15:29 ET T: 02/12/2014 00:03:53 ET JOB#: [id number]  cc: Katharina Caper, MD, <Dictator> Darlin Priestly. Lady Gary, MD Neomia Dear. Harrington Challenger, MD Annice Needy, MD RIMA Winona Legato MD ELECTRONICALLY SIGNED 03/05/2014 5:46

## 2014-12-06 NOTE — H&P (Signed)
PATIENT NAME:  YURITZA, PAULHUS MR#:  [id number] DATE OF BIRTH:  19-Jun-1934  DATE OF ADMISSION:  02/10/2014  REFERRING PHYSICIAN: Dr. Daryel November  PRIMARY CARE PHYSICIAN: Dr. Mickey Farber  PRIMARY CARDIOLOGY: Dr. Lady Gary.  CHIEF COMPLAINT: Found on a sleep study to have atrial fibrillation with RVR.   HISTORY OF PRESENT ILLNESS: This is a 81 year old female with known history for atrial fibrillation, hypertension, hyperlipidemia, COPD who presents with atrial fibrillation with RVR. The patient was having a sleep study being done as an outpatient where she was noticed by the technician at the sleep lab to have tachycardia, atrial fibrillation with RVR with heart rate in the 160s. Upon presentation, the patient has a pulse in the 160s. She was given atenolol and IV metoprolol x 2.  currently heart rate much improved around 105 to 110. The patient denies any palpitation, but she did report shortness of breath and worsening lower extremity edema. She denied any sense of palpitation, chest pain. The patient's chest x-ray does show cardiomegaly with vascular congestion picture. The patient's most recent echo was done last year which does show an EF of 50% with left ventricular hypertrophy, but she does not have official diagnosis of CHF. The patient's troponin was negative. Blood work did not show any significant abnormalities. Hospitalist service requested to admit the patient for further treatment of her atrial fibrillation and CHF.  PAST MEDICAL HISTORY:  1. COPD.  2. Hyperlipidemia.  3. Anemia.  4. Hypertension.  5. Osteoarthrosis.  6. Vitamin D deficiency.  7. Proteinuria.  8. Frequency.  9. Atrial fibrillation.  PAST SURGERY: Hip surgery, knee replacement, rotator cuff surgery, spinal surgery for back pain, hernia repair, hysterectomy, multiple skin cancer resections.   ALLERGIES: IBUPROFEN AND PERCOCET.   HOME MEDICATIONS:  1. Xarelto 20 mg oral daily.  2. Metoprolol 25 mg p.o.  b.i.d.  3. Quinapril 40 mg oral daily.  4. Zocor 40 mg daily.  5. Anoro Ellipta 62.5/25 mcg inhalational at 1 puff daily.  6. Albuterol as needed.  7. Fish oil 500 mg oral daily.  8. Vitamin D3 at 2000 international units daily.   SOCIAL HISTORY: The patient denies any alcohol, any smoking, has remote history of smoking.   FAMILY HISTORY: Significant for coronary artery disease.   REVIEW OF SYSTEMS:   GENERAL: Denies fever, chills, complaints of fatigue, weakness.  EYES: Denies blurry vision, double vision, inflammation. EARS, NOSE, AND THROAT: Denies double vision, hearing loss.  RESPIRATORY: Denies cough, wheezing, hemoptysis. Reports history of COPD. As well complains of shortness of breath.  CARDIOVASCULAR: Denies chest pain, palpitation, syncope. Reports history of atrial fibrillation.  GASTROINTESTINAL: Denies nausea, vomiting, diarrhea, abdominal pain, hematemesis.  GENITOURINARY: Denies dysuria, hematuria, renal colic.  ENDOCRINE: Denies polyuria, polydipsia, heat or cold intolerance.  HEMATOLOGY: Denies anemia, easy bruising, bleeding diathesis.  INTEGUMENT: Denies acne, rash, or skin lesion.  MUSCULOSKELETAL: Denies any gout. Reports history of arthritis.  NEUROLOGIC: Denies any focal deficits, any history of CVA, TIA. Any tremors.  PSYCHIATRIC: Denies anxiety, insomnia, or depression.   PHYSICAL EXAMINATION:  VITAL SIGNS: Temperature 97.4, pulse 106, respiratory rate 20, blood pressure 145/70, saturating 94% on 2 liters nasal cannula.  GENERAL: A well-nourished female, looks comfortable in bed, in no apparent distress.  HEENT: Head atraumatic, normocephalic. Pupils are equal and reactive to light. Pink conjunctivae. Anicteric sclerae. Moist oral mucosa.  NECK: Supple. No thyromegaly. No JVD.  CHEST: Good air entry bilaterally. No wheezing, has bibasilar crackles.  CARDIOVASCULAR: S1, S2 heard.  Irregular irregular tachycardic.  ABDOMEN: Soft, nontender, nondistended. Bowel  sounds present.  EXTREMITIES: +1 edema bilaterally. No clubbing. No cyanosis. Pedal and radial pulses felt bilaterally.  PSYCHIATRIC: Appropriate affect. Awake, alert x 3. Intact judgment and insight.  NEUROLOGIC: Cranial nerves grossly intact. Motor 5/5. No focal deficits.  MUSCULOSKELETAL: No joint effusion or erythema or tenderness to palpation.   PERTINENT LABORATORY DATA: Glucose 223, BNP 3020, BUN 30, creatinine 1.32, sodium 141, potassium 3.68, chloride 108, CO2 of 25. Troponin 0.03, ALT 79, AST 49, white blood cells 6.3, hemoglobin 14.3, hematocrit 43.7, platelets 187,000. INR 2.3.   EKG showing atrial fibrillation at 148 beats per minute.   ASSESSMENT AND PLAN:  1. Atrial fibrillation with rapid ventricular response. Currently, it is much better controlled. Will resume her back on her metoprolol and Xarelto for anticoagulation, and if needed we will manage with p.r.n. IV Cardizem pushes and if remains uncontrolled, we will increase her metoprolol 25 mg p.o. q. 6 hours.  2. Congestive heart failure, most recent echocardiogram last year showing hypertrophy and systolic ejection fraction of 69%50%. At this point it most likely diastolic in origin but we will repeat echocardiogram to evaluate the systolic function as well. We will start on IV Lasix. We will check echocardiogram. We will check daily weights and consult cardiology. Will cycle cardiac enzymes.  3. Hypertension. Blood pressure acceptable. Continue with home medication. We will hold benazepril temporarily due to slight bump in her creatinine.  4. Hyperlipidemia. Continue with fish oil and statin.  5. Weakness. The patient reports generalized weakness. We will check urinalysis. We will consult physical therapy.  6. Chronic obstructive pulmonary disease. The patient has no active wheezing. We will continue her on p.r.n. albuterol and on Anoro Ellipta. 7. Deep venous thrombosis prophylaxis. The patient will be on SCDs and TED hose. She is  already on Xarelto for atrial fibrillation.   CODE STATUS: Full code.   TOTAL TIME SPENT ON ADMISSION AND PATIENT CARE: 55 minutes.    ____________________________ Starleen Armsawood S. Elgergawy, MD dse:lt D: 02/10/2014 05:35:00 ET T: 02/10/2014 05:50:51 ET JOB#: [id number]  cc: Starleen Armsawood S. Elgergawy, MD, <Dictator> DAWOOD Teena IraniS ELGERGAWY MD ELECTRONICALLY SIGNED 02/21/2014 0:48

## 2014-12-06 NOTE — Consult Note (Signed)
PATIENT NAME:  Allison Shepard, Allison Shepard MR#:  [id number] DATE OF BIRTH:  Mar 17, 1934  DATE OF CONSULTATION:  02/10/2014  REFERRING PHYSICIAN:  Susa GriffinsPadmaja Vasireddy, MD CONSULTING PHYSICIAN:  Lamar BlinksBruce J. Kowalski, MD  REASON FOR CONSULTATION: Atrial fibrillation with rapid ventricular rate, hypertension and diastolic heart failure.   CHIEF COMPLAINT: "I have shortness of breath."   HISTORY OF PRESENT ILLNESS: This is a 81 year old female with severe acute shortness of breath over the last 4 to 5 weeks with significant shortness of breath with any physical activity, relieved by rest, and some mild chest pressure. The patient also has tachycardia with concerns of atrial fibrillation seen by sleep study with rapid ventricular rate and was admitted to the hospital with rapid ventricular rate and now slightly improved with a heart rate of 110 beats per minute on beta blocker. The patient has had hypertension, reasonably well-controlled, hyperlipidemia, on simvastatin, and COPD with no evidence of significant exacerbation at this time. Recent echocardiogram has shown normal LV systolic function with left ventricular hypertrophy and diastolic dysfunction and was placed on metoprolol and Xarelto for further risk reduction of cardiovascular event with atrial fibrillation. The patient now has relatively control. Troponin is normal without evidence of myocardial infarction.  REVIEW OF SYSTEMS: The remainder is negative for vision change, ringing in the ears, hearing loss, cough, congestion, heartburn, nausea, vomiting, diarrhea, bloody stools, stomach pain, extremity pain, leg weakness, cramping of the buttocks, known blood clots, headaches, blackouts, dizzy spells, nosebleeds, congestion, trouble swallowing, frequent urination, urination at night, muscle weakness, numbness, anxiety, depression, skin lesions or skin rashes.   PAST MEDICAL HISTORY: 1.  Atrial fibrillation.  2.  Hypertension.  3.  Hyperlipidemia.  4.   COPD. 5.  Diastolic dysfunction.   FAMILY HISTORY: No family members with early onset of cardiovascular disease or hypertension.   SOCIAL HISTORY: Currently denies alcohol or tobacco use.   ALLERGIES: As listed.   MEDICATIONS: As listed.   PHYSICAL EXAMINATION: VITAL SIGNS: Blood pressure is 122/68 bilaterally and heart rate is 110 and irregular.  GENERAL: She is a well-appearing female in no acute distress.  HEENT: No icterus, thyromegaly, ulcers, hemorrhage or xanthelasma.  CARDIOVASCULAR: Irregularly irregular. Normal S1 and S2. No apparent murmur, gallop, or rub. PMI is diffuse. Carotid upstroke is normal without bruit. Jugular venous pressure is normal.  LUNGS: Have diffuse wheezes.  ABDOMEN: Soft, nontender. Cannot assess hepatosplenomegaly or masses due to increased abdominal girth.  EXTREMITIES: 2+ radial, femoral, and dorsal pedal pulses with trace lower extremity edema. No cyanosis, clubbing or ulcers.  NEUROLOGIC: She is oriented to time, place, and person with normal mood and affect.   ASSESSMENT: A 81 year old female with atrial fibrillation with rapid ventricular rate, hypertension, hyperlipidemia, chronic obstructive pulmonary disease and diastolic dysfunction needing further treatment options.   RECOMMENDATIONS: 1.  Increase metoprolol to 50 mg twice per day for better heart rate control of atrial fibrillation and possible spontaneous conversion to normal rhythm.  2.  Anticoagulation with Xarelto for further risk reduction in stroke. The patient understands all risks and benefits of anticoagulation.  3.  No change in hypertension control with current medical regimen. 4.  Continue treatment of diastolic dysfunction and congestive heart failure with diuretics as needed, including Lasix.  5.  Begin ambulation and adjustments of medication management. ____________________________ Lamar BlinksBruce J. Kowalski, MD bjk:sb D: 02/10/2014 08:27:11 ET T: 02/10/2014 08:55:26  ET JOB#: [id number]  cc: Lamar BlinksBruce J. Kowalski, MD, <Dictator> Lamar BlinksBRUCE J KOWALSKI MD ELECTRONICALLY SIGNED 02/17/2014 13:21

## 2015-03-11 ENCOUNTER — Encounter: Payer: Self-pay | Admitting: Anesthesiology

## 2015-03-11 ENCOUNTER — Ambulatory Visit: Payer: Medicare Other | Attending: Anesthesiology | Admitting: Anesthesiology

## 2015-03-11 VITALS — BP 120/77 | HR 92 | Temp 97.6°F | Resp 20 | Ht >= 80 in | Wt 200.0 lb

## 2015-03-11 DIAGNOSIS — Z9889 Other specified postprocedural states: Secondary | ICD-10-CM

## 2015-03-11 DIAGNOSIS — M25551 Pain in right hip: Secondary | ICD-10-CM

## 2015-03-11 DIAGNOSIS — M7070 Other bursitis of hip, unspecified hip: Secondary | ICD-10-CM

## 2015-03-11 DIAGNOSIS — M25552 Pain in left hip: Principal | ICD-10-CM

## 2015-03-11 DIAGNOSIS — I482 Chronic atrial fibrillation, unspecified: Secondary | ICD-10-CM

## 2015-03-11 MED ORDER — MELOXICAM 7.5 MG PO TABS: 7.5000 mg | ORAL_TABLET | Freq: Two times a day (BID) | ORAL | Status: DC

## 2015-03-11 NOTE — Progress Notes (Signed)
Subjective:    Patient ID: Allison Shepard, female    DOB: 1934/01/27, 81 y.o.   MRN: [id number] This patient is a 81 year old lady who presents with bilateral hip. The  patient indicates that she had chronic low back pain for a long time and recently had an 8 hour spinal surgery with hardware implantation she indicates that her back pain is completely resolved but now she has alternating bilateral hip pain. She was also diagnosed as having bursitis of both hips Currently she has no pain but when the pain is present her subjective pain intensity rating is 100%  Pain medications She does not take any pain medications at this time  Other medications Other medications include diltiazem lisinopril metoprolol Lasix Xarelto simvastatin Lantus vitamin D and fish oil  Allergies She says that she is allergic to Percocet and ibuprofen but on describing her symptoms it appears to be a adverse reaction but not classic anaphylactic reaction  Past medical history As medical history is positive for hypertension and atrial fibrillation pulmonary atrial ectasis cardiomegaly and chest congestion. She also has been diagnosed with having diabetes mellitus and also has a long history of chronic low back pain.  Past surgical history Past surgical history is positive for 8 hours spinal surgery with hardware implantation. She's also had a total abdominal hysterectomy with bilateral salpingo-oophorectomy for uterine hemorrhage. She's also had right hip surgery right knee surgery and right shoulder surgery  Social and economic history She used to smoke half pack of cigarettes a day but she stopped smoking 35 years ago She does not use any illicit She uses alcohol socially and that she drinks a glass of wine with her meals  Family history  Her mother is deceased from a motor vehicular accident at 60 She did not know how far She has one brother who is alive at age 49 and has complications of osteoarthritis  of both knees and alcoholism She does not have any sisters She has been married for 15 years and has 2 daughters age 3 and 46.   Leg Pain  The incident occurred more than 1 week ago. Incident location: Bilateral hip pain occurred following  her spinal surgery. There was no injury mechanism. The pain is present in the right hip and left hip. The quality of the pain is described as stabbing and burning. The pain is at a severity of 10/10 (The patient indicates that she has no pain at the present time but when the pain occurs it is usually with her subjective pain intensity rating of 100%). The pain is severe. The pain has been intermittent since onset. Foreign body present: The patient had an 8 hour long spinal surgery with hardware implantation around the vertebral bodies and there could be cause effect relationship. The symptoms are aggravated by weight bearing and movement. She has tried nothing for the symptoms. The treatment provided no relief.      Review of Systems  Constitutional: Negative.  Negative for fever, chills, diaphoresis, activity change, appetite change, fatigue and unexpected weight change.       This pleasant delightful lady is a little bit overweight and that she weighs 200 pounds and is not tall lady. She appears much more overweight and she really  Eyes: Negative.   Respiratory: Positive for shortness of breath and wheezing. Negative for apnea, cough, choking, chest tightness and stridor.        She has exertional dyspnea associated with wheezing and she also has atrial  fibrillation associated with pulmonary atrial ectasis cardiomegaly and chest: Congestion  Cardiovascular:       She has a history of chronic atrial fibrillation and also has hypertension. She takes Xarelto for her atrial fibrillation  Endocrine: Negative for cold intolerance, heat intolerance, polydipsia, polyphagia and polyuria.       She's been recently diagnosed with type 2 diabetes mellitus   Genitourinary:       Genitourinary examination was deferred  Allergic/Immunologic: Negative.   Neurological: Negative.   Hematological: Negative.   Psychiatric/Behavioral: Negative.        Objective:   Physical Exam  Constitutional: She appears well-developed and well-nourished. No distress.  HENT:  Head: Normocephalic and atraumatic.  Right Ear: External ear normal.  Left Ear: External ear normal.  Nose: Nose normal.  Mouth/Throat: Oropharynx is clear and moist.  Eyes: Conjunctivae and EOM are normal. Pupils are equal, round, and reactive to light. Right eye exhibits no discharge. Left eye exhibits no discharge. No scleral icterus.  Neck: Normal range of motion. Neck supple. No JVD present. No tracheal deviation present. No thyromegaly present.  Cardiovascular: Normal rate, normal heart sounds and intact distal pulses.  Exam reveals no gallop and no friction rub.   No murmur heard. This patient has chronic atrial fibrillation  Pulmonary/Chest: Effort normal. She has wheezes.  Chest auscultation revealed multiple scattered rhonchi  Abdominal: Soft. Bowel sounds are normal. She exhibits no distension and no mass. There is no tenderness. There is no rebound and no guarding.  Genitourinary:  Genitourinary examination was deferred  Musculoskeletal: Normal range of motion. She exhibits no edema or tenderness.  Lymphadenopathy:    She has no cervical adenopathy.  Neurological: She is alert. She has normal reflexes. No cranial nerve deficit. Coordination normal.  Skin: Skin is warm. No rash noted. She is not diaphoretic. No erythema. No pallor.  Psychiatric: She has a normal mood and affect. Her behavior is normal. Judgment and thought content normal.   Vital signs were stable Blood pressure was 120/77 Her pulse was 92 bpm Respirations were 20 breaths per minute  Her SPO2 was 96% There was tenderness over the anterior superior iliac spine and in the tissue just lateral of that  structure Straight-leg raising test on both legs were normal There were no neurological signs following pinprick and touch examination There were no other positive findings         Assessment & Plan:  1 chronic bilateral hip pain 2 status post majors spinal surgery with hardware implantation 3 bursitis of both 4 mild obesity 5 atrial fibrillation with cardiomegaly  Plan of management  1 Will plan a caudal epidural steroid injection 2 Will plan to inject the sites of pain close to the anterior superior iliac spine into the caudal epidural steroid injection is not effective 3 Will begin her on Aloxi comes 7.5 mg twice a day after meals and give HER-2 weeks of with 2 refills 4 I will not stop the Xarelto since her atrial fibrillation since and the shortness of breath along with the wheezes suggests that there is significant cardio respiratory issues present. Since I'm not going close to the spinal canal it is preferable not to stop the Xarelto and proceed with the procedure well without discontinuing.  Lance Bosch MD

## 2015-03-11 NOTE — Patient Instructions (Signed)
A script for Meloxicam was sent to your pharmacy. Epidural Steroid Injection Patient Information  Description: The epidural space surrounds the nerves as they exit the spinal cord.  In some patients, the nerves can be compressed and inflamed by a bulging disc or a tight spinal canal (spinal stenosis).  By injecting steroids into the epidural space, we can bring irritated nerves into direct contact with a potentially helpful medication.  These steroids act directly on the irritated nerves and can reduce swelling and inflammation which often leads to decreased pain.  Epidural steroids may be injected anywhere along the spine and from the neck to the low back depending upon the location of your pain.   After numbing the skin with local anesthetic (like Novocaine), a small needle is passed into the epidural space slowly.  You may experience a sensation of pressure while this is being done.  The entire block usually last less than 10 minutes.  Conditions which may be treated by epidural steroids:   Low back and leg pain  Neck and arm pain  Spinal stenosis  Post-laminectomy syndrome  Herpes zoster (shingles) pain  Pain from compression fractures  Preparation for the injection:  1. Do not eat any solid food or dairy products within 6 hours of your appointment.  2. You may drink clear liquids up to 2 hours before appointment.  Clear liquids include water, black coffee, juice or soda.  No milk or cream please. 3. You may take your regular medication, including pain medications, with a sip of water before your appointment  Diabetics should hold regular insulin (if taken separately) and take 1/2 normal NPH dos the morning of the procedure.  Carry some sugar containing items with you to your appointment. 4. A driver must accompany you and be prepared to drive you home after your procedure.  5. Bring all your current medications with your. 6. An IV may be inserted and sedation may be given at the  discretion of the physician.   7. A blood pressure cuff, EKG and other monitors will often be applied during the procedure.  Some patients may need to have extra oxygen administered for a short period. 8. You will be asked to provide medical information, including your allergies, prior to the procedure.  We must know immediately if you are taking blood thinners (like Coumadin/Warfarin)  Or if you are allergic to IV iodine contrast (dye). We must know if you could possible be pregnant.  Possible side-effects:  Bleeding from needle site  Infection (rare, may require surgery)  Nerve injury (rare)  Numbness & tingling (temporary)  Difficulty urinating (rare, temporary)  Spinal headache ( a headache worse with upright posture)  Light -headedness (temporary)  Pain at injection site (several days)  Decreased blood pressure (temporary)  Weakness in arm/leg (temporary)  Pressure sensation in back/neck (temporary)  Call if you experience:  Fever/chills associated with headache or increased back/neck pain.  Headache worsened by an upright position.  New onset weakness or numbness of an extremity below the injection site  Hives or difficulty breathing (go to the emergency room)  Inflammation or drainage at the infection site  Severe back/neck pain  Any new symptoms which are concerning to you  Please note:  Although the local anesthetic injected can often make your back or neck feel good for several hours after the injection, the pain will likely return.  It takes 3-7 days for steroids to work in the epidural space.  You may not notice any   pain relief for at least that one week.  If effective, we will often do a series of three injections spaced 3-6 weeks apart to maximally decrease your pain.  After the initial series, we generally will wait several months before considering a repeat injection of the same type.  If you have any questions, please call [phone] [place]  [facility] 

## 2015-03-11 NOTE — Progress Notes (Signed)
Safety precautions to be maintained throughout the outpatient stay will include: orient to surroundings, keep bed in low position, maintain call bell within reach at all times, provide assistance with transfer out of bed and ambulation.  

## 2015-03-13 ENCOUNTER — Encounter: Payer: Self-pay | Admitting: Anesthesiology

## 2015-03-13 ENCOUNTER — Ambulatory Visit: Payer: Medicare Other | Admitting: Anesthesiology

## 2015-03-13 VITALS — BP 112/66 | HR 93 | Temp 98.3°F | Resp 20 | Wt 200.0 lb

## 2015-03-13 DIAGNOSIS — M7070 Other bursitis of hip, unspecified hip: Secondary | ICD-10-CM

## 2015-03-13 DIAGNOSIS — I4891 Unspecified atrial fibrillation: Secondary | ICD-10-CM | POA: Insufficient documentation

## 2015-03-13 DIAGNOSIS — Z79899 Other long term (current) drug therapy: Secondary | ICD-10-CM | POA: Insufficient documentation

## 2015-03-13 DIAGNOSIS — Z9889 Other specified postprocedural states: Secondary | ICD-10-CM

## 2015-03-13 DIAGNOSIS — M7071 Other bursitis of hip, right hip: Secondary | ICD-10-CM | POA: Insufficient documentation

## 2015-03-13 DIAGNOSIS — G8929 Other chronic pain: Secondary | ICD-10-CM | POA: Insufficient documentation

## 2015-03-13 DIAGNOSIS — M25552 Pain in left hip: Secondary | ICD-10-CM

## 2015-03-13 DIAGNOSIS — E669 Obesity, unspecified: Secondary | ICD-10-CM | POA: Insufficient documentation

## 2015-03-13 DIAGNOSIS — I482 Chronic atrial fibrillation, unspecified: Secondary | ICD-10-CM

## 2015-03-13 DIAGNOSIS — M7072 Other bursitis of hip, left hip: Secondary | ICD-10-CM | POA: Insufficient documentation

## 2015-03-13 DIAGNOSIS — Z96649 Presence of unspecified artificial hip joint: Principal | ICD-10-CM

## 2015-03-13 DIAGNOSIS — M25511 Pain in right shoulder: Secondary | ICD-10-CM

## 2015-03-13 DIAGNOSIS — J441 Chronic obstructive pulmonary disease with (acute) exacerbation: Secondary | ICD-10-CM | POA: Diagnosis not present

## 2015-03-13 DIAGNOSIS — M25551 Pain in right hip: Secondary | ICD-10-CM

## 2015-03-13 DIAGNOSIS — T8484XS Pain due to internal orthopedic prosthetic devices, implants and grafts, sequela: Secondary | ICD-10-CM

## 2015-03-13 MED ORDER — BUPIVACAINE HCL (PF) 0.25 % IJ SOLN
INTRAMUSCULAR | Status: AC
  Administered 2015-03-13: 09:00:00
  Filled 2015-03-13: qty 30

## 2015-03-13 MED ORDER — IOHEXOL 180 MG/ML  SOLN
INTRAMUSCULAR | Status: AC
  Administered 2015-03-13: 09:00:00
  Filled 2015-03-13: qty 20

## 2015-03-13 MED ORDER — TRIAMCINOLONE ACETONIDE 40 MG/ML IJ SUSP
INTRAMUSCULAR | Status: AC
  Administered 2015-03-13: 09:00:00
  Filled 2015-03-13: qty 2

## 2015-03-13 MED ORDER — LIDOCAINE HCL (PF) 1 % IJ SOLN
INTRAMUSCULAR | Status: AC
  Filled 2015-03-13: qty 5

## 2015-03-13 MED ORDER — FENTANYL CITRATE (PF) 100 MCG/2ML IJ SOLN
INTRAMUSCULAR | Status: AC
  Filled 2015-03-13: qty 2

## 2015-03-13 MED ORDER — MIDAZOLAM HCL 5 MG/5ML IJ SOLN
INTRAMUSCULAR | Status: AC
  Administered 2015-03-13: 2 mg via INTRAVENOUS
  Filled 2015-03-13: qty 5

## 2015-03-13 NOTE — Patient Instructions (Signed)

## 2015-03-13 NOTE — Progress Notes (Signed)
Dr. Starling Manns checked pt's injection site caudal area and replaced gauge .Ok for pt to gpohome. He was informed of pt's irregular heart rate. No new orders received. [email or web address] rn.

## 2015-03-13 NOTE — Progress Notes (Signed)
   Subjective:    PatienHerminio Heads Shepard, female    DOB: Sep 26, 1933, 81 y.o.   MRN: [id number]  HPI    Review of Systems     Objective:   Physical Exam  Procedure note Caudal epidural steroid injection  Date of procedure  03/13/2015  Preoperative diagnoses 1 chronic bilateral hip pain 2 status post spinal surgery with hardware implantation 3 bursitis of both hips 4 status post atrial fibrillation with the patient on Xarelto 5 obesity  Postoperative diagnosis Same  Surgeon Tod Persia M.D  Anesthesia Mac anesthesia administered under my direction  Informed consent was obtained and the patient appeared to accept and understand the benefits and risks of this procedure. Particular attention was paid to the fact that the patient was on Xarelto for atrial fibrillation. I decided that the potential risks of cardiac complications outweighed the potential risks of bleeding while doing the procedure with the the patient on Xarelto. Both she and her husband agreed with my decision. Timeout was taken in coordination with the nursing staff  Description of procedure The patient was taken to the operating room and intravenous sedation and Mac anesthesia was provided under my direction. After appropriate sedation the sacrococcygeal area was prepped with Betadine. After adequate drip and the area between the sacral cornua was palpated and infiltrated with 5 cc of 1% lidocaine  And AP fluoroscopic view of the sacrum was visualized and a 17-gauge Tuohy needle was inserted in the midline at an angle of 45 through the sacrococcygeal membrane. After making contact with the bone the needle was withdrawn and readvanced in a horizontal position into the caudal epidural space Aspirations were negative for blood and CSF and other body fluids  Epidurogram study 1 cc of Omnipaque 180 was injected through the needle and an epidurogram was visualized in both the lateral and AP views.  The distribution of contrast was satisfactory and the nerve roots at L4 and L5 were clearly visualized  Caudal epidural steroid injection Then 10 cc of 0.25% bupivacaine and 80 mg of Kenalog were injected into the caudal epidural space. The needle was removed and adequate hemostasis was obtained Pressure was applied at the point of the needle injection for about 10 minutes and adequate hemostasis was obtained and there was no evidence of any bleeding. Patient tolerated procedure quite well vital signs were stable there were no adverse effects and no complications  Patient was taken to the recovery room in satisfactory condition where she was observed for approximately 45 minutes. The sacrococcygeal were area was observed for any evidence of bleeding and there was none. She was advised to look for evidence of bleeding and she was discharged home in satisfactory condition and I will follow-up with her in 2 weeks.  Tod Persia M.D.     Assessment & Plan:

## 2015-03-13 NOTE — Progress Notes (Signed)
   Subjective:    Patient ID: Allison Shepard, female    DOB: 12-01-1933, 81 y.o.   MRN: [id number]  HPI    Review of Systems     Objective:   Physical Exam        Assessment & Plan:

## 2015-03-16 ENCOUNTER — Emergency Department: Payer: Medicare Other

## 2015-03-16 ENCOUNTER — Telehealth: Payer: Self-pay

## 2015-03-16 ENCOUNTER — Emergency Department
Admission: EM | Admit: 2015-03-16 | Discharge: 2015-03-16 | Disposition: A | Discharge status: Home / Self Care | Payer: Medicare Other | Source: Home / Self Care | Attending: Emergency Medicine | Admitting: Emergency Medicine

## 2015-03-16 ENCOUNTER — Other Ambulatory Visit: Payer: Self-pay

## 2015-03-16 ENCOUNTER — Encounter: Payer: Self-pay | Admitting: Emergency Medicine

## 2015-03-16 ENCOUNTER — Inpatient Hospital Stay
Admission: EM | Admit: 2015-03-16 | Discharge: 2015-03-20 | DRG: 190 | Disposition: A | Discharge status: Home / Self Care | Payer: Medicare Other | Attending: Internal Medicine | Admitting: Internal Medicine

## 2015-03-16 DIAGNOSIS — M1990 Unspecified osteoarthritis, unspecified site: Secondary | ICD-10-CM | POA: Diagnosis present

## 2015-03-16 DIAGNOSIS — I4891 Unspecified atrial fibrillation: Secondary | ICD-10-CM

## 2015-03-16 DIAGNOSIS — Z791 Long term (current) use of non-steroidal anti-inflammatories (NSAID): Secondary | ICD-10-CM | POA: Insufficient documentation

## 2015-03-16 DIAGNOSIS — E780 Pure hypercholesterolemia: Secondary | ICD-10-CM | POA: Diagnosis present

## 2015-03-16 DIAGNOSIS — N183 Chronic kidney disease, stage 3 (moderate): Secondary | ICD-10-CM | POA: Diagnosis present

## 2015-03-16 DIAGNOSIS — Z885 Allergy status to narcotic agent status: Secondary | ICD-10-CM

## 2015-03-16 DIAGNOSIS — J441 Chronic obstructive pulmonary disease with (acute) exacerbation: Principal | ICD-10-CM | POA: Diagnosis present

## 2015-03-16 DIAGNOSIS — I129 Hypertensive chronic kidney disease with stage 1 through stage 4 chronic kidney disease, or unspecified chronic kidney disease: Secondary | ICD-10-CM | POA: Diagnosis present

## 2015-03-16 DIAGNOSIS — I509 Heart failure, unspecified: Secondary | ICD-10-CM | POA: Diagnosis present

## 2015-03-16 DIAGNOSIS — I10 Essential (primary) hypertension: Secondary | ICD-10-CM

## 2015-03-16 DIAGNOSIS — Z7902 Long term (current) use of antithrombotics/antiplatelets: Secondary | ICD-10-CM

## 2015-03-16 DIAGNOSIS — G8929 Other chronic pain: Secondary | ICD-10-CM | POA: Diagnosis present

## 2015-03-16 DIAGNOSIS — Z79899 Other long term (current) drug therapy: Secondary | ICD-10-CM | POA: Insufficient documentation

## 2015-03-16 DIAGNOSIS — Z87891 Personal history of nicotine dependence: Secondary | ICD-10-CM

## 2015-03-16 DIAGNOSIS — Z7901 Long term (current) use of anticoagulants: Secondary | ICD-10-CM | POA: Insufficient documentation

## 2015-03-16 DIAGNOSIS — E669 Obesity, unspecified: Secondary | ICD-10-CM | POA: Diagnosis present

## 2015-03-16 DIAGNOSIS — N179 Acute kidney failure, unspecified: Secondary | ICD-10-CM | POA: Diagnosis present

## 2015-03-16 DIAGNOSIS — I482 Chronic atrial fibrillation: Secondary | ICD-10-CM | POA: Diagnosis present

## 2015-03-16 DIAGNOSIS — E119 Type 2 diabetes mellitus without complications: Secondary | ICD-10-CM

## 2015-03-16 DIAGNOSIS — Z886 Allergy status to analgesic agent status: Secondary | ICD-10-CM

## 2015-03-16 DIAGNOSIS — Z794 Long term (current) use of insulin: Secondary | ICD-10-CM | POA: Diagnosis not present

## 2015-03-16 DIAGNOSIS — R0603 Acute respiratory distress: Secondary | ICD-10-CM | POA: Diagnosis present

## 2015-03-16 DIAGNOSIS — R0600 Dyspnea, unspecified: Secondary | ICD-10-CM

## 2015-03-16 DIAGNOSIS — J9600 Acute respiratory failure, unspecified whether with hypoxia or hypercapnia: Secondary | ICD-10-CM | POA: Diagnosis present

## 2015-03-16 DIAGNOSIS — M545 Low back pain: Secondary | ICD-10-CM | POA: Diagnosis present

## 2015-03-16 DIAGNOSIS — E1121 Type 2 diabetes mellitus with diabetic nephropathy: Secondary | ICD-10-CM | POA: Diagnosis present

## 2015-03-16 DIAGNOSIS — Z6841 Body Mass Index (BMI) 40.0 and over, adult: Secondary | ICD-10-CM

## 2015-03-16 HISTORY — DX: Pure hypercholesterolemia, unspecified: E78.00

## 2015-03-16 HISTORY — DX: Essential (primary) hypertension: I10

## 2015-03-16 LAB — BASIC METABOLIC PANEL
Anion gap: 10 (ref 5–15)
BUN: 41 mg/dL — ABNORMAL HIGH (ref 6–20)
CALCIUM: 8.4 mg/dL — AB (ref 8.9–10.3)
CO2: 19 mmol/L — ABNORMAL LOW (ref 22–32)
Chloride: 105 mmol/L (ref 101–111)
Creatinine, Ser: 1.25 mg/dL — ABNORMAL HIGH (ref 0.44–1.00)
GFR calc Af Amer: 46 mL/min — ABNORMAL LOW (ref >60)
GFR, EST NON AFRICAN AMERICAN: 40 mL/min — AB (ref >60)
Glucose, Bld: 142 mg/dL — ABNORMAL HIGH (ref 65–99)
POTASSIUM: 4.8 mmol/L (ref 3.5–5.1)
SODIUM: 134 mmol/L — AB (ref 135–145)

## 2015-03-16 LAB — COMPREHENSIVE METABOLIC PANEL
ALT: 106 U/L — AB (ref 14–54)
ANION GAP: 8 (ref 5–15)
AST: 92 U/L — AB (ref 15–41)
Albumin: 3.5 g/dL (ref 3.5–5.0)
Alkaline Phosphatase: 72 U/L (ref 38–126)
BUN: 43 mg/dL — ABNORMAL HIGH (ref 6–20)
CALCIUM: 8.6 mg/dL — AB (ref 8.9–10.3)
CO2: 23 mmol/L (ref 22–32)
Chloride: 110 mmol/L (ref 101–111)
Creatinine, Ser: 1.42 mg/dL — ABNORMAL HIGH (ref 0.44–1.00)
GFR, EST AFRICAN AMERICAN: 40 mL/min — AB (ref >60)
GFR, EST NON AFRICAN AMERICAN: 34 mL/min — AB (ref >60)
Glucose, Bld: 142 mg/dL — ABNORMAL HIGH (ref 65–99)
Potassium: 5.1 mmol/L (ref 3.5–5.1)
Sodium: 141 mmol/L (ref 135–145)
Total Bilirubin: 0.6 mg/dL (ref 0.3–1.2)
Total Protein: 6.7 g/dL (ref 6.5–8.1)

## 2015-03-16 LAB — CBC
HCT: 40 % (ref 35.0–47.0)
Hemoglobin: 13.5 g/dL (ref 12.0–16.0)
MCH: 33.8 pg (ref 26.0–34.0)
MCHC: 33.7 g/dL (ref 32.0–36.0)
MCV: 100.3 fL — AB (ref 80.0–100.0)
Platelets: 181 10*3/uL (ref 150–440)
RBC: 3.99 MIL/uL (ref 3.80–5.20)
RDW: 14.5 % (ref 11.5–14.5)
WBC: 10.1 10*3/uL (ref 3.6–11.0)

## 2015-03-16 LAB — CBC WITH DIFFERENTIAL/PLATELET
Basophils Absolute: 0.1 10*3/uL (ref 0–0.1)
Basophils Relative: 1 %
Eosinophils Absolute: 0.1 10*3/uL (ref 0–0.7)
Eosinophils Relative: 1 %
HCT: 43.3 % (ref 35.0–47.0)
Hemoglobin: 14.5 g/dL (ref 12.0–16.0)
Lymphocytes Relative: 7 %
Lymphs Abs: 0.7 10*3/uL — ABNORMAL LOW (ref 1.0–3.6)
MCH: 33.8 pg (ref 26.0–34.0)
MCHC: 33.5 g/dL (ref 32.0–36.0)
MCV: 101 fL — ABNORMAL HIGH (ref 80.0–100.0)
MONO ABS: 0.7 10*3/uL (ref 0.2–0.9)
MONOS PCT: 7 %
Neutro Abs: 9.1 10*3/uL — ABNORMAL HIGH (ref 1.4–6.5)
Neutrophils Relative %: 84 %
PLATELETS: 195 10*3/uL (ref 150–440)
RBC: 4.29 MIL/uL (ref 3.80–5.20)
RDW: 14.6 % — AB (ref 11.5–14.5)
WBC: 10.7 10*3/uL (ref 3.6–11.0)

## 2015-03-16 LAB — TROPONIN I: Troponin I: <0.03 ng/mL (ref <0.031)

## 2015-03-16 LAB — PROTIME-INR
INR: 2.34
Prothrombin Time: 25.8 seconds — ABNORMAL HIGH (ref 11.4–15.0)

## 2015-03-16 LAB — GLUCOSE, CAPILLARY
GLUCOSE-CAPILLARY: 120 mg/dL — AB (ref 65–99)
Glucose-Capillary: 117 mg/dL — ABNORMAL HIGH (ref 65–99)

## 2015-03-16 MED ORDER — INSULIN ASPART 100 UNIT/ML ~~LOC~~ SOLN
0.0000 [IU] | Freq: Three times a day (TID) | SUBCUTANEOUS | Status: DC
  Administered 2015-03-17 (×2): 3 [IU] via SUBCUTANEOUS
  Administered 2015-03-17 – 2015-03-19 (×5): 2 [IU] via SUBCUTANEOUS
  Administered 2015-03-19: 1 [IU] via SUBCUTANEOUS
  Administered 2015-03-20: 2 [IU] via SUBCUTANEOUS
  Administered 2015-03-20: 1 [IU] via SUBCUTANEOUS
  Filled 2015-03-16: qty 1
  Filled 2015-03-16 (×3): qty 2
  Filled 2015-03-16: qty 1
  Filled 2015-03-16: qty 3
  Filled 2015-03-16 (×2): qty 2
  Filled 2015-03-16: qty 3
  Filled 2015-03-16: qty 2

## 2015-03-16 MED ORDER — ONDANSETRON HCL 4 MG/2ML IJ SOLN
4.0000 mg | Freq: Once | INTRAMUSCULAR | Status: AC
  Administered 2015-03-16: 4 mg via INTRAVENOUS
  Filled 2015-03-16: qty 2

## 2015-03-16 MED ORDER — LEVOFLOXACIN IN D5W 750 MG/150ML IV SOLN: 750.0000 mg | INTRAVENOUS | Status: DC

## 2015-03-16 MED ORDER — SODIUM CHLORIDE 0.9 % IV SOLN: 250.0000 mL | INTRAVENOUS | Status: DC | PRN

## 2015-03-16 MED ORDER — LORAZEPAM 2 MG/ML IJ SOLN
1.0000 mg | Freq: Once | INTRAMUSCULAR | Status: AC
  Administered 2015-03-16: 1 mg via INTRAVENOUS
  Filled 2015-03-16: qty 1

## 2015-03-16 MED ORDER — IPRATROPIUM-ALBUTEROL 0.5-2.5 (3) MG/3ML IN SOLN
3.0000 mL | Freq: Four times a day (QID) | RESPIRATORY_TRACT | Status: DC
  Administered 2015-03-17 – 2015-03-20 (×12): 3 mL via RESPIRATORY_TRACT
  Filled 2015-03-16 (×13): qty 3

## 2015-03-16 MED ORDER — INSULIN ASPART 100 UNIT/ML ~~LOC~~ SOLN
25.0000 [IU] | Freq: Three times a day (TID) | SUBCUTANEOUS | Status: DC
  Administered 2015-03-17 – 2015-03-20 (×11): 25 [IU] via SUBCUTANEOUS
  Filled 2015-03-16 (×11): qty 25

## 2015-03-16 MED ORDER — ACETAMINOPHEN 650 MG RE SUPP: 650.0000 mg | Freq: Four times a day (QID) | RECTAL | Status: DC | PRN

## 2015-03-16 MED ORDER — ONDANSETRON HCL 4 MG/2ML IJ SOLN: 4.0000 mg | Freq: Four times a day (QID) | INTRAMUSCULAR | Status: DC | PRN

## 2015-03-16 MED ORDER — MELOXICAM 7.5 MG PO TABS
7.5000 mg | ORAL_TABLET | Freq: Two times a day (BID) | ORAL | Status: DC
  Administered 2015-03-17 – 2015-03-20 (×7): 7.5 mg via ORAL
  Filled 2015-03-16 (×9): qty 1

## 2015-03-16 MED ORDER — LEVOFLOXACIN IN D5W 500 MG/100ML IV SOLN
500.0000 mg | Freq: Once | INTRAVENOUS | Status: AC
  Administered 2015-03-17: 500 mg via INTRAVENOUS
  Filled 2015-03-16: qty 100

## 2015-03-16 MED ORDER — LEVOFLOXACIN IN D5W 250 MG/50ML IV SOLN
250.0000 mg | INTRAVENOUS | Status: DC
Start: 2015-03-17 — End: 2015-03-20
  Administered 2015-03-17 – 2015-03-19 (×3): 250 mg via INTRAVENOUS
  Filled 2015-03-16 (×4): qty 50

## 2015-03-16 MED ORDER — METHYLPREDNISOLONE SODIUM SUCC 125 MG IJ SOLR
60.0000 mg | Freq: Four times a day (QID) | INTRAMUSCULAR | Status: DC
  Administered 2015-03-17 (×2): 60 mg via INTRAVENOUS
  Filled 2015-03-16 (×3): qty 2

## 2015-03-16 MED ORDER — ACETAMINOPHEN 325 MG PO TABS
650.0000 mg | ORAL_TABLET | Freq: Four times a day (QID) | ORAL | Status: DC | PRN
  Filled 2015-03-16: qty 2

## 2015-03-16 MED ORDER — METOPROLOL SUCCINATE ER 100 MG PO TB24
100.0000 mg | ORAL_TABLET | Freq: Every day | ORAL | Status: DC
Start: 2015-03-17 — End: 2015-03-20
  Administered 2015-03-17 – 2015-03-20 (×4): 100 mg via ORAL
  Filled 2015-03-16 (×4): qty 1

## 2015-03-16 MED ORDER — FUROSEMIDE 10 MG/ML IJ SOLN
40.0000 mg | Freq: Every day | INTRAMUSCULAR | Status: DC
  Administered 2015-03-17: 40 mg via INTRAVENOUS
  Filled 2015-03-16: qty 4

## 2015-03-16 MED ORDER — SENNOSIDES-DOCUSATE SODIUM 8.6-50 MG PO TABS: 1.0000 | ORAL_TABLET | Freq: Every evening | ORAL | Status: DC | PRN

## 2015-03-16 MED ORDER — SODIUM CHLORIDE 0.9 % IJ SOLN
3.0000 mL | Freq: Two times a day (BID) | INTRAMUSCULAR | Status: DC
  Administered 2015-03-17: 3 mL via INTRAVENOUS

## 2015-03-16 MED ORDER — ONDANSETRON HCL 4 MG PO TABS: 4.0000 mg | ORAL_TABLET | Freq: Four times a day (QID) | ORAL | Status: DC | PRN

## 2015-03-16 MED ORDER — SIMVASTATIN 10 MG PO TABS
10.0000 mg | ORAL_TABLET | Freq: Every day | ORAL | Status: DC
  Administered 2015-03-17 – 2015-03-20 (×4): 10 mg via ORAL
  Filled 2015-03-16 (×4): qty 1

## 2015-03-16 MED ORDER — SODIUM CHLORIDE 0.9 % IJ SOLN
3.0000 mL | INTRAMUSCULAR | Status: DC | PRN
Start: 2015-03-16 — End: 2015-03-20

## 2015-03-16 MED ORDER — MAGNESIUM SULFATE 2 GM/50ML IV SOLN
2.0000 g | Freq: Once | INTRAVENOUS | Status: AC
Start: 2015-03-16 — End: 2015-03-16
  Administered 2015-03-16: 2 g via INTRAVENOUS
  Filled 2015-03-16: qty 50

## 2015-03-16 MED ORDER — SODIUM CHLORIDE 0.9 % IJ SOLN
3.0000 mL | Freq: Two times a day (BID) | INTRAMUSCULAR | Status: DC
  Administered 2015-03-17 – 2015-03-20 (×8): 3 mL via INTRAVENOUS

## 2015-03-16 MED ORDER — IPRATROPIUM-ALBUTEROL 0.5-2.5 (3) MG/3ML IN SOLN
RESPIRATORY_TRACT | Status: AC
  Administered 2015-03-16: 20:00:00
  Filled 2015-03-16: qty 3

## 2015-03-16 MED ORDER — METHYLPREDNISOLONE SODIUM SUCC 125 MG IJ SOLR
125.0000 mg | Freq: Once | INTRAMUSCULAR | Status: AC
  Administered 2015-03-16: 125 mg via INTRAVENOUS
  Filled 2015-03-16: qty 2

## 2015-03-16 MED ORDER — ALBUTEROL SULFATE (2.5 MG/3ML) 0.083% IN NEBU: 2.5000 mg | INHALATION_SOLUTION | Freq: Four times a day (QID) | RESPIRATORY_TRACT | Status: DC | PRN

## 2015-03-16 MED ORDER — DILTIAZEM HCL ER 180 MG PO CP24
180.0000 mg | ORAL_CAPSULE | Freq: Every day | ORAL | Status: DC
  Administered 2015-03-17 – 2015-03-20 (×4): 180 mg via ORAL
  Filled 2015-03-16 (×8): qty 1

## 2015-03-16 MED ORDER — LISINOPRIL 5 MG PO TABS
5.0000 mg | ORAL_TABLET | Freq: Every day | ORAL | Status: DC
  Administered 2015-03-17 – 2015-03-20 (×4): 5 mg via ORAL
  Filled 2015-03-16 (×4): qty 1

## 2015-03-16 MED ORDER — IPRATROPIUM-ALBUTEROL 0.5-2.5 (3) MG/3ML IN SOLN
9.0000 mL | Freq: Once | RESPIRATORY_TRACT | Status: AC
  Administered 2015-03-16: 9 mL via RESPIRATORY_TRACT

## 2015-03-16 MED ORDER — ENOXAPARIN SODIUM 30 MG/0.3ML ~~LOC~~ SOLN: 30.0000 mg | SUBCUTANEOUS | Status: DC

## 2015-03-16 MED ORDER — RIVAROXABAN 20 MG PO TABS
20.0000 mg | ORAL_TABLET | Freq: Every day | ORAL | Status: DC
  Administered 2015-03-17 – 2015-03-20 (×4): 20 mg via ORAL
  Filled 2015-03-16 (×4): qty 1

## 2015-03-16 MED ORDER — FUROSEMIDE 10 MG/ML IJ SOLN
20.0000 mg | Freq: Once | INTRAMUSCULAR | Status: AC
  Administered 2015-03-16: 20 mg via INTRAVENOUS
  Filled 2015-03-16: qty 4

## 2015-03-16 MED ORDER — ALBUTEROL SULFATE (2.5 MG/3ML) 0.083% IN NEBU: 2.5000 mg | INHALATION_SOLUTION | RESPIRATORY_TRACT | Status: DC | PRN

## 2015-03-16 MED ORDER — IPRATROPIUM-ALBUTEROL 0.5-2.5 (3) MG/3ML IN SOLN
RESPIRATORY_TRACT | Status: AC
  Filled 2015-03-16: qty 6

## 2015-03-16 MED ORDER — INSULIN GLARGINE 100 UNIT/ML ~~LOC~~ SOLN
25.0000 [IU] | Freq: Two times a day (BID) | SUBCUTANEOUS | Status: DC
  Administered 2015-03-17 – 2015-03-20 (×8): 25 [IU] via SUBCUTANEOUS
  Filled 2015-03-16 (×11): qty 0.25

## 2015-03-16 NOTE — H&P (Signed)
[place] [facility] [place] at [facility]   PATIENT NAME: Allison Shepard    MR#:  [id number]  DATE OF BIRTH:  December 05, 1933  DATE OF ADMISSION:  03/16/2015  PRIMARY CARE PHYSICIAN: Mickey Farber, MD   REQUESTING/REFERRING PHYSICIAN: Pershing Proud, MD  CHIEF COMPLAINT:  Shortness of breath  HISTORY OF PRESENT ILLNESS:  Allison Shepard  is a 81 y.o. female with a known history of COPD and chronic shortness of breath is brought into the ED with worsening of shortness of breath and cough from this afternoon. Denies any fever. Patient was nauseous and vomited couple of times at home today. No history of congestive heart failure but recently seen by Dr.  Candelaria Stagers echocardiogram done last Wednesday, but family members do not know the results of it. Patient was short of breath, hypoxic and working hard in the ED, was given Solu-Medrol 125 mg IV and was placed on BiPAP. As patient was anxious to wear BiPAP mask she was given 1 dose of Ativan following which patient was very lethargic. I wasn't able to get any history from the patient as she was sedated from Ativan  PAST MEDICAL HISTORY:   Past Medical History  Diagnosis Date  . COPD (chronic obstructive pulmonary disease)   . Diabetes mellitus without complication   . Atrial fibrillation   . Hypertension   . Hypercholesteremia     PAST SURGICAL HISTOIRY:   Past Surgical History  Procedure Laterality Date  . Abdominal hysterectomy    . Shoulder fusion surgery Right   . Knee surgery Right   . Hip surgery Right   . Back surgery      SOCIAL HISTORY:   History  Substance Use Topics  . Smoking status: Never Smoker   . Smokeless tobacco: Not on file  . Alcohol Use: 0.6 oz/week    0 Standard drinks or equivalent, 1 Glasses of wine per week     Comment: daily    FAMILY HISTORY:   Diabetes mellitus and hypertension runs in her family DRUG ALLERGIES:   Allergies  Allergen Reactions  . Hydromorphone Hcl Nausea And  Vomiting  . Ibuprofen Other (See Comments)    Pt was told by her MD not to take this medication.    Marland Kitchen Percocet [Oxycodone-Acetaminophen] Hives    REVIEW OF SYSTEMS:  Review of systems is unobtainable as the patient is sedated  MEDICATIONS AT HOME:   Prior to Admission medications   Medication Sig Start Date End Date Taking? Authorizing Provider  albuterol (PROVENTIL HFA;VENTOLIN HFA) 108 (90 BASE) MCG/ACT inhaler Inhale 2 puffs into the lungs every 6 (six) hours as needed for wheezing or shortness of breath.   Yes Historical Provider, MD  albuterol (PROVENTIL) (2.5 MG/3ML) 0.083% nebulizer solution Take 2.5 mg by nebulization every 6 (six) hours as needed for wheezing or shortness of breath.   Yes Historical Provider, MD  Cholecalciferol (VITAMIN D-3) 1000 UNITS CAPS Take 1,000 Units by mouth daily.   Yes Historical Provider, MD  diltiazem (TIAZAC) 180 MG 24 hr capsule Take 180 mg by mouth daily.   Yes Historical Provider, MD  furosemide (LASIX) 40 MG tablet Take 40 mg by mouth daily.   Yes Historical Provider, MD  insulin aspart (NOVOLOG) 100 UNIT/ML injection Inject 25 Units into the skin 3 (three) times daily.    Yes Historical Provider, MD  insulin glargine (LANTUS) 100 UNIT/ML injection Inject 25 Units into the skin 2 (two) times daily.    Yes Historical Provider, MD  lisinopril (PRINIVIL,ZESTRIL) 5 MG tablet Take 5 mg by mouth daily.   Yes Historical Provider, MD  meloxicam (MOBIC) 7.5 MG tablet Take 1 tablet (7.5 mg total) by mouth 2 (two) times daily after a meal. 03/11/15  Yes Tod Persia, MD  metoprolol succinate (TOPROL-XL) 100 MG 24 hr tablet Take 100 mg by mouth daily. -   Yes Historical Provider, MD  Multiple Vitamins-Minerals (AIRBORNE PO) Take 1 tablet by mouth daily.    Yes Historical Provider, MD  rivaroxaban (XARELTO) 20 MG TABS tablet Take 20 mg by mouth daily.   Yes Historical Provider, MD  simvastatin (ZOCOR) 10 MG tablet Take 10 mg by mouth daily.   Yes Historical  Provider, MD  Umeclidinium-Vilanterol (ANORO ELLIPTA) 62.5-25 MCG/INH AEPB Inhale 1 puff into the lungs daily.    Yes Historical Provider, MD      VITAL SIGNS:  Blood pressure 125/78, pulse 90, temperature 97.8 F (36.6 C), temperature source Oral, resp. rate 20, height  (1.651 m), weight 108.863 kg (240 lb), SpO2 94 %.  PHYSICAL EXAMINATION:  GENERAL:  81 y.o.-year-old patient lying in the bed with no acute distress. On BiPAP EYES: Pupils equal, round, reactive to light and accommodation. No scleral icterus. HEENT: Head atraumatic, normocephalic. Oropharynx and nasopharynx clear.  NECK:  Supple, no jugular venous distention. No thyroid enlargement, no tenderness.  LUNGS: Coarse breath sounds with moderate air entry bilaterally, end expiratory  wheezing, few rales and,rhonchi at bases , no crepitation. Using accessory muscles of respiration.  CADIOVASCULAR: S1, S2 normal. No murmurs, rubs, or gallops.  ABDOMEN: Soft, nontender, nondistended. Bowel sounds present. No organomegaly or mass.  EXTREMITIES: No pedal edema, cyanosis, or clubbing.  NEUROLOGIC: Patient is lethargic. Gait not checked.  PSYCHIATRIC: Patient is lethargic SKIN: No obvious rash, lesion, or ulcer.   LABORATORY PANEL:   CBC  Recent Labs Lab 03/16/15 1913  WBC 10.7  HGB 14.5  HCT 43.3  PLT 195   ------------------------------------------------------------------------------------------------------------------  Chemistries   Recent Labs Lab 03/16/15 0759 03/16/15 1913  NA 141 134*  K 5.1 4.8  CL 110 105  CO2 23 19*  GLUCOSE 142* 142*  BUN 43* 41*  CREATININE 1.42* 1.25*  CALCIUM 8.6* 8.4*  AST 92*  --   ALT 106*  --   ALKPHOS 72  --   BILITOT 0.6  --    ------------------------------------------------------------------------------------------------------------------  Cardiac Enzymes  Recent Labs Lab 03/16/15 1913  TROPONINI <0.03    ------------------------------------------------------------------------------------------------------------------  RADIOLOGY:  Dg Chest 1 View  03/16/2015   CLINICAL DATA:  Subsequent encounter for worsening shortness of Breath after emergency room visit earlier today.  EXAM: CHEST  1 VIEW  COMPARISON:  Earlier the same day  FINDINGS: 1941 hours. Low lung volumes. Assessment a Lung bases limited by patient body habitus and portable technique. There is mild vascular congestion without overt airspace pulmonary edema. Probable atelectasis in the lung bases. The cardio pericardial silhouette is enlarged. Bones are diffusely demineralized. Telemetry leads overlie the chest.  IMPRESSION: Limited study secondary to body habitus and portable technique.  Low volume film with enlargement of the cardiopericardial silhouette and vascular congestion.   Electronically Signed   By: Kennith Center M.D.   On: 03/16/2015 19:58   Dg Chest 2 View  03/16/2015   CLINICAL DATA:  Progressive shortness of breath for the past month  EXAM: CHEST  2 VIEW  COMPARISON:  February 10, 2014  FINDINGS: Cardiomegaly is stable. Pulmonary vascularity is normal. There is patchy opacity in  the left lower lobe. The lungs elsewhere clear. No adenopathy. There is degenerative change in the thoracic spine.  IMPRESSION: Patchy opacity in the left lower lobe, likely atelectasis. Lungs elsewhere clear. Heart enlarged with pulmonary vascularity within normal limits.   Electronically Signed   By: Bretta Bang III M.D.   On: 03/16/2015 08:04    EKG:   Orders placed or performed in visit on 03/16/15  . EKG 12-Lead    IMPRESSION AND PLAN:   Allison Shepard  is a 81 y.o. female with a known history of COPD and chronic shortness of breath is brought into the ED with worsening of shortness of breath and cough from this afternoon. Denies any fever. Patient was nauseous and vomited couple of times at home today. No history of congestive heart  failure but recently seen by Dr.  Candelaria Stagers echocardiogram done last Wednesday, but family members do not know the results of it. Patient was short of breath, hypoxic and working hard in the ED, was given Solu-Medrol 125 mg IV and was placed on BiPAP. As patient was anxious to wear BiPAP mask she was given 1 dose of Ativan following which patient was very lethargic  1. Acute respiratory distress secondary to acute exacerbation of COPD and  vascular congestion  Solu-Medrol 125 mg IV was given in the ED. Will continue Solu-Medrol 60 mg IV every 6 hours We will get sputum culture and sensitivity DUO. nebs every 6 hours Albuterol nebulizer treatments every 4 hours as needed for shortness of breath Levofloxacin IV once daily Currently on BiPAP 10 over 5  2. Pulmonary vascular congestion with no history of congestive heart failure Will get echocardiogram report from Dr. America Brown office  In a.m. Continue Lasix 40 mg IV daily with close monitoring of the renal function  3. Chronic history of atrial fibrillation Currently rate controlled Continue Xarelto  4. Insulin requiring diabetes mellitus Continue home dose Lantus 25 units subcutaneously twice a day Sliding scale insulin Diabetic diet  5. Chronic history of hypertension Continue home medications  DVT prophylaxis with Xarelto  All the records are reviewed and case discussed with ED provider. Management plans discussed with the patient, family and they are in agreement.  CODE STATUS: Full code, husband is the healthcare power of attorney  TOTAL CRITICAL CARE TIME TAKING CARE OF THIS PATIENT: Reviewing medical records, history and physical, admission orders and coronation of care -50 minutes.    Ramonita Lab M.D on 03/16/2015 at 8:45 PM  Between 7am to 6pm - Pager - [phone]  After 6pm go to [email or web address] - password [email or web address] [facility]  [facility]  Office  [phone]  CC: Primary care physician; Mickey Farber,  MD

## 2015-03-16 NOTE — ED Provider Notes (Signed)
[facility] Emergency Department Provider Note  ____________________________________________  Time seen: Seen upon arrival to the emergency department.   I have reviewed the triage vital signs and the nursing notes.   HISTORY  Chief Complaint Shortness of Breath   HPI Allison Shepard is a 81 y.o. female with a history of COPD and A. fib who presents today with worsening shortness of breath. Per family, she has been having worsening shortness of breath with a cough over the past several months. The patient denies any pain. However, she says that she does feel nauseous and vomited at home today. She denies any history of congestive heart failure. Says that she used to smoke but no longer does. Denies any fever home. Was in the hospital this morning for age of fibrillation with rapid ventricular response which has resolved.   Past Medical History  Diagnosis Date  . COPD (chronic obstructive pulmonary disease)   . Diabetes mellitus without complication   . Atrial fibrillation   . Hypertension   . Hypercholesteremia     There are no active problems to display for this patient.   Past Surgical History  Procedure Laterality Date  . Abdominal hysterectomy    . Shoulder fusion surgery Right   . Knee surgery Right   . Hip surgery Right   . Back surgery      Current Outpatient Rx  Name  Route  Sig  Dispense  Refill  . albuterol (PROVENTIL HFA;VENTOLIN HFA) 108 (90 BASE) MCG/ACT inhaler   Inhalation   Inhale 2 puffs into the lungs every 6 (six) hours as needed for wheezing or shortness of breath.         Marland Kitchen albuterol (PROVENTIL) (2.5 MG/3ML) 0.083% nebulizer solution   Nebulization   Take 2.5 mg by nebulization every 6 (six) hours as needed for wheezing or shortness of breath.         . Cholecalciferol (VITAMIN D-3) 1000 UNITS CAPS   Oral   Take 1,000 Units by mouth daily.         Marland Kitchen diltiazem (TIAZAC) 180 MG 24 hr capsule   Oral   Take 180  mg by mouth daily.         . furosemide (LASIX) 40 MG tablet   Oral   Take 40 mg by mouth daily.         . insulin aspart (NOVOLOG) 100 UNIT/ML injection   Subcutaneous   Inject 25-30 Units into the skin 3 (three) times daily before meals. Mr. Pickron states that they have been giving 25 units before each meal, every day.         . insulin glargine (LANTUS) 100 UNIT/ML injection   Subcutaneous   Inject 30 Units into the skin 2 (two) times daily. Mr. Kunin states that they have been giving 25 units twice daily; in the morning and in the evening.         Marland Kitchen lisinopril (PRINIVIL,ZESTRIL) 5 MG tablet   Oral   Take 5 mg by mouth daily.         . meloxicam (MOBIC) 7.5 MG tablet   Oral   Take 1 tablet (7.5 mg total) by mouth 2 (two) times daily after a meal.   28 tablet   2   . metoprolol succinate (TOPROL-XL) 100 MG 24 hr tablet   Oral   Take 100 mg by mouth daily. Take with or immediately following a meal.         .  Multiple Vitamins-Minerals (AIRBORNE PO)   Oral   Take 1 tablet by mouth as needed.         . rivaroxaban (XARELTO) 20 MG TABS tablet   Oral   Take 20 mg by mouth daily.         . simvastatin (ZOCOR) 10 MG tablet   Oral   Take 10 mg by mouth daily.         Marland Kitchen Umeclidinium-Vilanterol (ANORO ELLIPTA) 62.5-25 MCG/INH AEPB   Inhalation   Inhale 1 Inhaler into the lungs daily.           Allergies Hydromorphone hcl; Ibuprofen; and Percocet  No family history on file.  Social History History  Substance Use Topics  . Smoking status: Never Smoker   . Smokeless tobacco: Not on file  . Alcohol Use: 0.6 oz/week    0 Standard drinks or equivalent, 1 Glasses of wine per week     Comment: daily    Review of Systems Constitutional: No fever/chills Eyes: No visual changes. ENT: No sore throat. Cardiovascular: Denies chest pain. Respiratory:As above Gastrointestinal: No abdominal pain.  No nausea, no vomiting.  No diarrhea.  No  constipation. Genitourinary: Negative for dysuria. Musculoskeletal: Negative for back pain. Skin: Negative for rash. Neurological: Negative for headaches, focal weakness or numbness.  10-point ROS otherwise negative.  ____________________________________________   PHYSICAL EXAM:  VITAL SIGNS: ED Triage Vitals  Enc Vitals Group     BP 03/16/15 1924 154/99 mmHg     Pulse Rate 03/16/15 1924 89     Resp --      Temp 03/16/15 1924 97.8 F (36.6 C)     Temp Source 03/16/15 1924 Oral     SpO2 03/16/15 1924 97 %     Weight 03/16/15 1924 240 lb (108.863 kg)     Height 03/16/15 1924 5\' 5"  (1.651 m)     Head Cir --      Peak Flow --      Pain Score --      Pain Loc --      Pain Edu? --      Excl. in GC? --     Constitutional: Alert and orienMild to moderate distress EYES:  Conjunctivae are normal. PERRL. EOMI. Head: Atraumatic. Nose: No congestion/rhinnorhea. Mouth/Throat: Mucous membranes are moist.  Oropharynx non-erythematous. Neck: No stridor.   Cardiovascular: Normal rate, irregularly irregular rhythm. Grossly normal heart sounds.  Good peripheral circulation. Respirincreased respiratory effort with use of accessory muscles. Patient is tachypnea. Decreased air movement throughout.  Gastrointestinal: Soft and nontender. No distention. No abdominal bruits. No CVA tenderness. Musculoskeletal: Mild lower extremity edema bilaterally. .  No joint effusions. Neurologic:  Normal speech and language. No gross focal neurologic deficits are appreciated. No gait instability. Skin:  Skin is warm, dry and intact. No rash noted. Psychiatric: Mood and affect are normal. Speech and behavior are normal.  ____________________________________________   LABS (all labs ordered are listed, but only abnormal results are displayed)  Labs Reviewed  CBC WITH DIFFERENTIAL/PLATELET - Abnormal; Notable for the following:    MCV 101.0 (*)    RDW 14.6 (*)    Neutro Abs 9.1 (*)    Lymphs Abs 0.7  (*)    All other components within normal limits  BASIC METABOLIC PANEL - Abnormal; Notable for the following:    Sodium 134 (*)    CO2 19 (*)    Glucose, Bld 142 (*)    BUN 41 (*)    Creatinine,  Ser 1.25 (*)    Calcium 8.4 (*)    GFR calc non Af Amer 40 (*)    GFR calc Af Amer 46 (*)    All other components within normal limits  TROPONIN I   ____________________________________________  EKG  ED ECG REPORT I, Elan Brainerd,  Teena Irani, the attending physician, personally viewed and interpreted this ECG.   Date: 03/16/2015  EKG Time: 1908  Rate: 108  Rhythm: atrial fibrillation, rate 108  Axis: Normal axis  Intervals:none  ST&T Change:  No ST elevations or depressions. No abnormal T-wave inversions.  ____________________________________________  RADILimited study secondary to body habitus. Low-volume film with large cardiac silhouette and vascular congestion.___________   PROCEDURES  CRITICAL CARE Performed by: Arelia Longest   Total critical care time: 35 minutes  Critical care time was exclusive of separately billable procedures and treating other patients.  Critical care was necessary to treat or prevent imminent or life-threatening deterioration.  Critical care was time spent personally by me on the following activities: development of treatment plan with patient and/or surrogate as well as nursing, discussions with consultants, evaluation of patient's response to treatment, examination of patient, obtaining history from patient or surrogate, ordering and performing treatments and interventions, ordering and review of laboratory studies, ordering and review of radiographic studies, pulse oximetry and re-evaluation of patient's condition.   ____________________________________________   INITIAL IMPRESSION / ASSESSMENT AND PLAN / ED COURSE  Pertinent labs & imaging results that were available during my care of the patient were reviewed by me and considered in my  medical decision making (see chart for details).  ----------------------------------------- 7:40 PM on 03/16/2015 -----------------------------------------  Patient given 1 mg of Ativan because of anxious and claustrophobic with BiPAP.   ----------------------------------------- 8:11 PM on 03/16/2015 -----------------------------------------  Patient tolerating BiPAP well after Ativan. We'll give 20 mg IV Lasix secondary to pulmonary vascular congestion.we'll admit. Signed out to Dr. Amado Coe.  ____________________________________________   FINAL CLINICAL IMPRESSION(S) / ED DIAGNOSES  Acute COPD exacerbation.   Myrna Blazer, MD 03/16/15 2012

## 2015-03-16 NOTE — Discharge Instructions (Signed)
You have been seen in the emergency department today for shortness of breath. As we discussed your workup today as shown largely normal results. If shortness of breath is likely due to her atrial fibrillation running at a high rate. Please follow-up with cardiology on 03/18/15 at 2:45 PM. Please call today or tomorrow to confirm your appointment time and location. Return to the emergency department for any difficulty breathing, chest pain, or any other symptom personally concerning to yourself. Please also follow-up with your primary care doctor soon as possible. Of note your INR is 2.3 today, which is fairly unchanged from your labs on 02/11/15, however you do not appear to be on any anticoagulation that should affect your INR level thus it is important to follow-up with your primary care doctor regarding this.     Atrial Fibrillation Atrial fibrillation is a type of irregular heart rhythm (arrhythmia). During atrial fibrillation, the upper chambers of the heart (atria) quiver continuously in a chaotic pattern. This causes an irregular and often rapid heart rate.  Atrial fibrillation is the result of the heart becoming overloaded with disorganized signals that tell it to beat. These signals are normally released one at a time by a part of the right atrium called the sinoatrial node. They then travel from the atria to the lower chambers of the heart (ventricles), causing the atria and ventricles to contract and pump blood as they pass. In atrial fibrillation, parts of the atria outside of the sinoatrial node also release these signals. This results in two problems. First, the atria receive so many signals that they do not have time to fully contract. Second, the ventricles, which can only receive one signal at a time, beat irregularly and out of rhythm with the atria.  There are three types of atrial fibrillation:   Paroxysmal. Paroxysmal atrial fibrillation starts suddenly and stops on its own within a  week.  Persistent. Persistent atrial fibrillation lasts for more than a week. It may stop on its own or with treatment.  Permanent. Permanent atrial fibrillation does not go away. Episodes of atrial fibrillation may lead to permanent atrial fibrillation. Atrial fibrillation can prevent your heart from pumping blood normally. It increases your risk of stroke and can lead to heart failure.  CAUSES   Heart conditions, including a heart attack, heart failure, coronary artery disease, and heart valve conditions.   Inflammation of the sac that surrounds the heart (pericarditis).  Blockage of an artery in the lungs (pulmonary embolism).  Pneumonia or other infections.  Chronic lung disease.  Thyroid problems, especially if the thyroid is overactive (hyperthyroidism).  Caffeine, excessive alcohol use, and use of some illegal drugs.   Use of some medicines, including certain decongestants and diet pills.  Heart surgery.   Birth defects.  Sometimes, no cause can be found. When this happens, the atrial fibrillation is called lone atrial fibrillation. The risk of complications from atrial fibrillation increases if you have lone atrial fibrillation and you are age 7 years or older. RISK FACTORS  Heart failure.  Coronary artery disease.  Diabetes mellitus.   High blood pressure (hypertension).   Obesity.   Other arrhythmias.   Increased age. SIGNS AND SYMPTOMS   A feeling that your heart is beating rapidly or irregularly.   A feeling of discomfort or pain in your chest.   Shortness of breath.   Sudden light-headedness or weakness.   Getting tired easily when exercising.   Urinating more often than normal (mainly when atrial fibrillation  first begins).  In paroxysmal atrial fibrillation, symptoms may start and suddenly stop. DIAGNOSIS  Your health care provider may be able to detect atrial fibrillation when taking your pulse. Your health care provider may  have you take a test called an ambulatory electrocardiogram (ECG). An ECG records your heartbeat patterns over a 24-hour period. You may also have other tests, such as:  Transthoracic echocardiogram (TTE). During echocardiography, sound waves are used to evaluate how blood flows through your heart.  Transesophageal echocardiogram (TEE).  Stress test. There is more than one type of stress test. If a stress test is needed, ask your health care provider about which type is best for you.  Chest X-ray exam.  Blood tests.  Computed tomography (CT). TREATMENT  Treatment may include:  Treating any underlying conditions. For example, if you have an overactive thyroid, treating the condition may correct atrial fibrillation.  Taking medicine. Medicines may be given to control a rapid heart rate or to prevent blood clots, heart failure, or a stroke.  Having a procedure to correct the rhythm of the heart:  Electrical cardioversion. During electrical cardioversion, a controlled, low-energy shock is delivered to the heart through your skin. If you have chest pain, very low blood pressure, or sudden heart failure, this procedure may need to be done as an emergency.  Catheter ablation. During this procedure, heart tissues that send the signals that cause atrial fibrillation are destroyed.  Surgical ablation. During this surgery, thin lines of heart tissue that carry the abnormal signals are destroyed. This procedure can either be an open-heart surgery or a minimally invasive surgery. With the minimally invasive surgery, small cuts are made to access the heart instead of a large opening.  Pulmonary venous isolation. During this surgery, tissue around the veins that carry blood from the lungs (pulmonary veins) is destroyed. This tissue is thought to carry the abnormal signals. HOME CARE INSTRUCTIONS   Take medicines only as directed by your health care provider. Some medicines can make atrial  fibrillation worse or recur.  If blood thinners were prescribed by your health care provider, take them exactly as directed. Too much blood-thinning medicine can cause bleeding. If you take too little, you will not have the needed protection against stroke and other problems.  Perform blood tests at home if directed by your health care provider. Perform blood tests exactly as directed.  Quit smoking if you smoke.  Do not drink alcohol.  Do not drink caffeinated beverages such as coffee, soda, and some teas. You may drink decaffeinated coffee, soda, or tea.   Maintain a healthy weight.Do not use diet pills unless your health care provider approves. They may make heart problems worse.   Follow diet instructions as directed by your health care provider.  Exercise regularly as directed by your health care provider.  Keep all follow-up visits as directed by your health care provider. This is important. PREVENTION  The following substances can cause atrial fibrillation to recur:   Caffeinated beverages.  Alcohol.  Certain medicines, especially those used for breathing problems.  Certain herbs and herbal medicines, such as those containing ephedra or ginseng.  Illegal drugs, such as cocaine and amphetamines. Sometimes medicines are given to prevent atrial fibrillation from recurring. Proper treatment of any underlying condition is also important in helping prevent recurrence.  SEEK MEDICAL CARE IF:  You notice a change in the rate, rhythm, or strength of your heartbeat.  You suddenly begin urinating more frequently.  You tire more easily  when exerting yourself or exercising. SEEK IMMEDIATE MEDICAL CARE IF:   You have chest pain, abdominal pain, sweating, or weakness.  You feel nauseous.  You have shortness of breath.  You suddenly have swollen feet and ankles.  You feel dizzy.  Your face or limbs feel numb or weak.  You have a change in your vision or speech. MAKE  SURE YOU:   Understand these instructions.  Will watch your condition.  Will get help right away if you are not doing well or get worse. Document Released: 08/01/2005 Document Revised: 12/16/2013 Document Reviewed: 09/11/2012 [facility] Patient Information 2015 [place], [place]. This information is not intended to replace advice given to you by your health care provider. Make sure you discuss any questions you have with your health care provider.  Shortness of Breath Shortness of breath means you have trouble breathing. Shortness of breath needs medical care right away. HOME CARE   Do not smoke.  Avoid being around chemicals or things (paint fumes, dust) that may bother your breathing.  Rest as needed. Slowly begin your normal activities.  Only take medicines as told by your doctor.  Keep all doctor visits as told. GET HELP RIGHT AWAY IF:   Your shortness of breath gets worse.  You feel lightheaded, pass out (faint), or have a cough that is not helped by medicine.  You cough up blood.  You have pain with breathing.  You have pain in your chest, arms, shoulders, or belly (abdomen).  You have a fever.  You cannot walk up stairs or exercise the way you normally do.  You do not get better in the time expected.  You have a hard time doing normal activities even with rest.  You have problems with your medicines.  You have any new symptoms. MAKE SURE YOU:  Understand these instructions.  Will watch your condition.  Will get help right away if you are not doing well or get worse. Document Released: 01/18/2008 Document Revised: 08/06/2013 Document Reviewed: 10/17/2011 [facility] Patient Information 2015 [place], [place]. This information is not intended to replace advice given to you by your health care provider. Make sure you discuss any questions you have with your health care provider.

## 2015-03-16 NOTE — Progress Notes (Signed)
ANTIBIOTIC CONSULT NOTE - INITIAL  Pharmacy Consult for Levaquin Indication: AECOPD  Allergies  Allergen Reactions  . Hydromorphone Hcl Nausea And Vomiting  . Ibuprofen Other (See Comments)    Pt was told by her MD not to take this medication.    Marland Kitchen Percocet [Oxycodone-Acetaminophen] Hives    Patient Measurements: Height:  (165.1 cm) Weight: 236 lb 8.9 oz (107.3 kg) IBW/kg (Calculated) : 57   Vital Signs: Temp: 99.3 F (37.4 C) (08/01 2341) Temp Source: Oral (08/01 2341) BP: 136/95 mmHg (08/01 2341) Pulse Rate: 117 (08/01 2341) Intake/Output from previous day:   Intake/Output from this shift:    Labs:  Recent Labs  03/16/15 0759 03/16/15 1913  WBC 10.1 10.7  HGB 13.5 14.5  PLT 181 195  CREATININE 1.42* 1.25*   Estimated Creatinine Clearance: 45.1 mL/min (by C-G formula based on Cr of 1.25). No results for input(s): VANCOTROUGH, VANCOPEAK, VANCORANDOM, GENTTROUGH, GENTPEAK, GENTRANDOM, TOBRATROUGH, TOBRAPEAK, TOBRARND, AMIKACINPEAK, AMIKACINTROU, AMIKACIN in the last 72 hours.   Microbiology: No results found for this or any previous visit (from the past 720 hour(s)).  Medical History: Past Medical History  Diagnosis Date  . COPD (chronic obstructive pulmonary disease)   . Diabetes mellitus without complication   . Atrial fibrillation   . Hypertension   . Hypercholesteremia     Medications:  Scheduled:  . [START ON 03/17/2015] diltiazem  180 mg Oral Daily  . [START ON 03/17/2015] furosemide  40 mg Intravenous Daily  . [START ON 03/17/2015] insulin aspart  0-9 Units Subcutaneous TID WC  . [START ON 03/17/2015] insulin aspart  25 Units Subcutaneous TID WC  . insulin glargine  25 Units Subcutaneous BID  . [START ON 03/17/2015] ipratropium-albuterol  3 mL Nebulization Q6H  . [START ON 03/17/2015] levofloxacin (LEVAQUIN) IV  250 mg Intravenous Q24H  . [START ON 03/17/2015] levofloxacin (LEVAQUIN) IV  500 mg Intravenous Once  . [START ON 03/17/2015] lisinopril  5 mg  Oral Daily  . [START ON 03/17/2015] meloxicam  7.5 mg Oral BID PC  . methylPREDNISolone (SOLU-MEDROL) injection  60 mg Intravenous Q6H  . [START ON 03/17/2015] metoprolol succinate  100 mg Oral Daily  . [START ON 03/17/2015] rivaroxaban  20 mg Oral Daily  . [START ON 03/17/2015] simvastatin  10 mg Oral Daily  . sodium chloride  3 mL Intravenous Q12H  . sodium chloride  3 mL Intravenous Q12H   Infusions:   PRN: sodium chloride, acetaminophen **OR** acetaminophen, albuterol, ondansetron **OR** ondansetron (ZOFRAN) IV, senna-docusate, sodium chloride  Assessment: 81 y/o F admitted with AECOPD.   Plan:  Levaquin 500 mg iv once then 250 mg iv q 24 h. Will f/u culture results and renal function.   [name] D 03/16/2015,11:57 PM

## 2015-03-16 NOTE — Telephone Encounter (Signed)
Patient stated that she was all right with no problems.

## 2015-03-16 NOTE — ED Notes (Signed)
Patient presents to Emergency Department via EMS with complaints of difficulty breathing since last night.  Hx of the same,  EMS reports afib with RVR 170's rate, after  cardizem Pt to 70-90 HR, insulin dependent DM DID NOT take insulin this am.

## 2015-03-16 NOTE — ED Notes (Signed)
Pt to triage this afternoon c/o increasing SOB, inability to walk or due anything since discharged this am

## 2015-03-16 NOTE — Addendum Note (Signed)
Addended by: Valerie Salts on: 03/16/2015 12:05 PM   Modules accepted: Level of Service

## 2015-03-16 NOTE — ED Provider Notes (Signed)
[facility] Emergency Department Provider Note  Time seen: 7:38 AM  I have reviewed the triage vital signs and the nursing notes.   HISTORY  Chief Complaint Respiratory Distress    HPI Allison Shepard is a 81 y.o. female with a past medical history of COPD, diabetes, hypertension, hyperlipidemia, atrial fibrillation on Xarelto and diltiazem who presents the emergency department for shortness of breath. According to the patient she was healing very short of breath this morning, so they called EMS. EMS arrived stating the patient was in atrial fibrillation with a heart rate of 140-170. She was given diltiazem the IV, upon arrival to the hospital the patient's heart rates currently 90-100 bpm in atrial fibrillation. Patient has a long history of atrial fibrillation. She states for the past 2-3 months she has been having intermittent shortness of breath similar to this morning, but this morning was more severe. Patient denies any chest pain. Denies any leg pain or increased swelling. Patient sees Dr. Lady Gary, and had an echocardiogram done last week. Patient states currently she feels well, with no complaints at this time.     Past Medical History  Diagnosis Date  . COPD (chronic obstructive pulmonary disease)   . Diabetes mellitus without complication   . Atrial fibrillation   . Hypertension   . Hypercholesteremia     There are no active problems to display for this patient.   Past Surgical History  Procedure Laterality Date  . Abdominal hysterectomy    . Shoulder fusion surgery Right   . Knee surgery Right   . Hip surgery Right   . Back surgery      Current Outpatient Rx  Name  Route  Sig  Dispense  Refill  . albuterol (PROVENTIL HFA;VENTOLIN HFA) 108 (90 BASE) MCG/ACT inhaler   Inhalation   Inhale 2 puffs into the lungs every 6 (six) hours as needed for wheezing or shortness of breath.         Marland Kitchen albuterol (PROVENTIL) (2.5 MG/3ML) 0.083%  nebulizer solution   Nebulization   Take 2.5 mg by nebulization every 6 (six) hours as needed for wheezing or shortness of breath.         . Cholecalciferol (VITAMIN D-3) 1000 UNITS CAPS   Oral   Take 1,000 Units by mouth daily.         Marland Kitchen diltiazem (TIAZAC) 180 MG 24 hr capsule   Oral   Take 180 mg by mouth daily.         . furosemide (LASIX) 40 MG tablet   Oral   Take 40 mg by mouth daily.         Marland Kitchen lisinopril (PRINIVIL,ZESTRIL) 5 MG tablet   Oral   Take 5 mg by mouth daily.         . meloxicam (MOBIC) 7.5 MG tablet   Oral   Take 1 tablet (7.5 mg total) by mouth 2 (two) times daily after a meal.   28 tablet   2   . metoprolol succinate (TOPROL-XL) 100 MG 24 hr tablet   Oral   Take 100 mg by mouth daily. Take with or immediately following a meal.         . Multiple Vitamins-Minerals (AIRBORNE PO)   Oral   Take 1 tablet by mouth as needed.         . rivaroxaban (XARELTO) 20 MG TABS tablet   Oral   Take 20 mg by mouth daily.         Marland Kitchen  simvastatin (ZOCOR) 10 MG tablet   Oral   Take 10 mg by mouth daily.         Marland Kitchen Umeclidinium-Vilanterol (ANORO ELLIPTA) 62.5-25 MCG/INH AEPB   Inhalation   Inhale 1 Inhaler into the lungs daily.         . insulin glargine (LANTUS) 100 UNIT/ML injection   Subcutaneous   Inject 40 Units into the skin at bedtime.           Allergies Hydromorphone hcl; Ibuprofen; and Percocet  History reviewed. No pertinent family history.  Social History History  Substance Use Topics  . Smoking status: Never Smoker   . Smokeless tobacco: Not on file  . Alcohol Use: 0.6 oz/week    0 Standard drinks or equivalent, 1 Glasses of wine per week     Comment: daily    Review of Systems Constitutional: Negative for fever. Cardiovascular: Chest tightness, now resolved. Respiratory: Shortness of breath, now resolved. Gastrointestinal: Negative for abdominal pain Neurological: Negative for headache 10-point ROS otherwise  negative.  ____________________________________________   PHYSICAL EXAM:  VITAL SIGNS: ED Triage Vitals  Enc Vitals Group     BP 03/16/15 0658 141/91 mmHg     Pulse --      Resp 03/16/15 0658 34     Temp 03/16/15 0658 97.7 F (36.5 C)     Temp Source 03/16/15 0658 Oral     SpO2 03/16/15 0647 94 %     Weight 03/16/15 0658 200 lb (90.719 kg)     Height 03/16/15 0658  (1.651 m)     Head Cir --      Peak Flow --      Pain Score 03/16/15 0700 10     Pain Loc --      Pain Edu? --      Excl. in GC? --     Constitutional: Alert and oriented. Well appearing and in no distress. Eyes: Normal exam ENT   Mouth/Throat: Mucous membranes are moist. Cardiovascular: Irregular rhythm, around 100 bpm. No murmur. Respiratory: Normal respiratory effort without tachypnea nor retractions. Breath sounds are clear and equal bilaterally. No wheezes/rales/rhonchi. Gastrointestinal: Soft and nontender. No distention.   Musculoskeletal: Nontender with normal range of motion in all extremities. No lower extremity tenderness  Neurologic:  Normal speech and language. No gross focal neurologic deficits Skin:  Skin is warm, dry and intact.  Psychiatric: Mood and affect are normal. Speech and behavior are normal.  ____________________________________________    EKG  EKG reviewed and interpreted by myself shows atrial fibrillation with rapid ventricular response around 103 bpm, narrow QRS, normal axis, normal QTC, nonspecific but no concerning ST changes noted.  ____________________________________________    RADIOLOGY  Chest x-ray shows likely atelectasis  ____________________________________________   INITIAL IMPRESSION / ASSESSMENT AND PLAN / ED COURSE  Pertinent labs & imaging results that were available during my care of the patient were reviewed by me and considered in my medical decision making (see chart for details).  Patient with shortness of breath this morning, in atrial  fibrillation with rapid ventricular response, now resolved after diltiazem by EMS. Patient now denies any symptoms. Her dyspnea was likely result from her atrial fibrillation with rapid ventricular response. We will check labs, chest x-ray, closely monitor in the emergency department.  Likely atelectasis on chest x-ray, labs are within normal limits. I discussed with the patient the need to follow-up with her primary care doctor. I discussed the patient with Dr. Juliann Pares who is on-call for  Dr. Lady Gary.  We have arranged for the patient had a follow-up appointment with cardiology on 03/18/15 at 2:45 PM. Patient remains symptom free in the emergency department. Her heart rate remains controlled around 95-100 bpm. The patient has not yet taken her diltiazem. We will dose her normal daily diltiazem, and discharge the patient home with cardiology follow-up in 2 days and primary care follow-up as soon as possible. Patient is agreeable to plan. ____________________________________________   FINAL CLINICAL IMPRESSION(S) / ED DIAGNOSES  Dyspnea Atrial fibrillation with rapid ventricular response   Minna Antis, MD 03/16/15 [phone]

## 2015-03-17 ENCOUNTER — Inpatient Hospital Stay: Admit: 2015-03-17 | Payer: Medicare Other

## 2015-03-17 LAB — BASIC METABOLIC PANEL
ANION GAP: 9 (ref 5–15)
BUN: 44 mg/dL — ABNORMAL HIGH (ref 6–20)
CO2: 21 mmol/L — AB (ref 22–32)
Calcium: 8.3 mg/dL — ABNORMAL LOW (ref 8.9–10.3)
Chloride: 109 mmol/L (ref 101–111)
Creatinine, Ser: 1.29 mg/dL — ABNORMAL HIGH (ref 0.44–1.00)
GFR calc Af Amer: 45 mL/min — ABNORMAL LOW (ref >60)
GFR, EST NON AFRICAN AMERICAN: 39 mL/min — AB (ref >60)
GLUCOSE: 198 mg/dL — AB (ref 65–99)
POTASSIUM: 5.4 mmol/L — AB (ref 3.5–5.1)
SODIUM: 139 mmol/L (ref 135–145)

## 2015-03-17 LAB — HEMOGLOBIN A1C: HEMOGLOBIN A1C: 7.2 % — AB (ref 4.0–6.0)

## 2015-03-17 LAB — MRSA PCR SCREENING: MRSA by PCR: NEGATIVE

## 2015-03-17 LAB — GLUCOSE, CAPILLARY
GLUCOSE-CAPILLARY: 243 mg/dL — AB (ref 65–99)
Glucose-Capillary: 176 mg/dL — ABNORMAL HIGH (ref 65–99)
Glucose-Capillary: 217 mg/dL — ABNORMAL HIGH (ref 65–99)
Glucose-Capillary: 212 mg/dL — ABNORMAL HIGH (ref 65–99)

## 2015-03-17 LAB — CBC
HCT: 39.1 % (ref 35.0–47.0)
HEMOGLOBIN: 13.2 g/dL (ref 12.0–16.0)
MCH: 34.2 pg — ABNORMAL HIGH (ref 26.0–34.0)
MCHC: 33.7 g/dL (ref 32.0–36.0)
MCV: 101.5 fL — ABNORMAL HIGH (ref 80.0–100.0)
Platelets: 162 10*3/uL (ref 150–440)
RBC: 3.85 MIL/uL (ref 3.80–5.20)
RDW: 14.5 % (ref 11.5–14.5)
WBC: 6.9 10*3/uL (ref 3.6–11.0)

## 2015-03-17 MED ORDER — DIPHENHYDRAMINE HCL 25 MG PO CAPS
25.0000 mg | ORAL_CAPSULE | Freq: Four times a day (QID) | ORAL | Status: DC | PRN
  Administered 2015-03-17 – 2015-03-19 (×3): 25 mg via ORAL
  Filled 2015-03-17 (×3): qty 1

## 2015-03-17 MED ORDER — ALPRAZOLAM 0.5 MG PO TABS
0.5000 mg | ORAL_TABLET | Freq: Once | ORAL | Status: AC
  Administered 2015-03-17: 0.5 mg via ORAL
  Filled 2015-03-17: qty 1

## 2015-03-17 MED ORDER — FLUTICASONE PROPIONATE 50 MCG/ACT NA SUSP
1.0000 | Freq: Every day | NASAL | Status: DC
  Administered 2015-03-17 – 2015-03-20 (×4): 1 via NASAL
  Filled 2015-03-17: qty 16

## 2015-03-17 MED ORDER — GUAIFENESIN ER 600 MG PO TB12
600.0000 mg | ORAL_TABLET | Freq: Two times a day (BID) | ORAL | Status: DC
  Administered 2015-03-17 – 2015-03-20 (×4): 600 mg via ORAL
  Filled 2015-03-17 (×5): qty 1

## 2015-03-17 MED ORDER — CHLORHEXIDINE GLUCONATE 0.12 % MT SOLN
15.0000 mL | Freq: Two times a day (BID) | OROMUCOSAL | Status: DC
  Administered 2015-03-17: 15 mL via OROMUCOSAL

## 2015-03-17 MED ORDER — METHYLPREDNISOLONE SODIUM SUCC 125 MG IJ SOLR
60.0000 mg | INTRAMUSCULAR | Status: DC
  Administered 2015-03-17 – 2015-03-20 (×4): 60 mg via INTRAVENOUS
  Filled 2015-03-17 (×3): qty 2

## 2015-03-17 MED ORDER — CETYLPYRIDINIUM CHLORIDE 0.05 % MT LIQD
7.0000 mL | Freq: Two times a day (BID) | OROMUCOSAL | Status: DC
Start: 2015-03-17 — End: 2015-03-17

## 2015-03-17 MED ORDER — FUROSEMIDE 10 MG/ML IJ SOLN
40.0000 mg | Freq: Two times a day (BID) | INTRAMUSCULAR | Status: DC
  Administered 2015-03-17 – 2015-03-18 (×2): 40 mg via INTRAVENOUS
  Filled 2015-03-17 (×2): qty 4

## 2015-03-17 MED ORDER — CETYLPYRIDINIUM CHLORIDE 0.05 % MT LIQD
7.0000 mL | Freq: Two times a day (BID) | OROMUCOSAL | Status: DC
  Administered 2015-03-17 – 2015-03-20 (×4): 7 mL via OROMUCOSAL

## 2015-03-17 NOTE — Progress Notes (Signed)
Pt states that her congestion is worse & that it's draining into her throat & she can't breathe, MD paged, MD to put in orders to help her congestion. Will continue to monitor. Shirley Friar, RN

## 2015-03-17 NOTE — Progress Notes (Addendum)
Patient transferred from CCU, oriented to room, fall, and safety contract reviewed. Focused assessment as charted. No complaints at this time. Skin verified with [name], RN. Telemetry box verified. Allison Shepard

## 2015-03-17 NOTE — Progress Notes (Signed)
Pt requesting something for sleep & something for congestion, MD paged, Dr. Betti Cruz, orders put in for xanax & mucinex. Will continue to monitor. Shirley Friar, RN

## 2015-03-17 NOTE — Progress Notes (Signed)
Echo was ordered for pt. Pt recently had appt with Dr. Lady Gary of [facility] on 7/27, echo was preformed at that time. Called [place] [facility] to get results of echo faxed over but the echo has not been read yet. Dr should read this afternoon or in the morning. [facility] will fax results once complete.

## 2015-03-17 NOTE — Progress Notes (Signed)
RN took patient off BIPAP and placed on 3 lpm nasal cannula. Patient doing well at this time with no distress.

## 2015-03-17 NOTE — Progress Notes (Addendum)
[facility] - [place] at [facility]                                                                                                                                                                                            Patient Demographics   Allison Shepard, is a 81 y.o. female, DOB - [birth year], ZOX:[id number]  Admit date - 03/16/2015   Admitting Physician Ramonita Lab, MD  Outpatient Primary MD for the patient is THIES, DAVID, MD   LOS - 1  Subjective: Patient continues to be short of breath, heart rate is labile but is much improved. Denies any chest pains     Review of Systems:   CONSTITUTIONAL: No documented fever. No fatigue, weakness. No weight gain, no weight loss.  EYES: No blurry or double vision.  ENT: No tinnitus. No postnasal drip. No redness of the oropharynx.  RESPIRATORY: No cough, no wheeze, no hemoptysis. Positive dyspnea.  CARDIOVASCULAR: No chest pain. No orthopnea. No palpitations. No syncope.  GASTROINTESTINAL: No nausea, no vomiting or diarrhea. No abdominal pain. No melena or hematochezia.  GENITOURINARY: No dysuria or hematuria.  ENDOCRINE: No polyuria or nocturia. No heat or cold intolerance.  HEMATOLOGY: No anemia. No bruising. No bleeding.  INTEGUMENTARY: No rashes. No lesions.  MUSCULOSKELETAL: No arthritis. No swelling. No gout.  NEUROLOGIC: No numbness, tingling, or ataxia. No seizure-type activity.  PSYCHIATRIC: No anxiety. No insomnia. No ADD.    Vitals:   Filed Vitals:   03/17/15 0945 03/17/15 1000 03/17/15 1053 03/17/15 1100  BP: 128/93 128/88 128/88 125/82  Pulse:  106  111  Temp:      TempSrc:      Resp:  13  18  Height:      Weight:      SpO2:  89%  94%    Wt Readings from Last 3 Encounters:  03/16/15 107.3 kg (236 lb 8.9 oz)  03/16/15 90.719 kg (200 lb)  03/13/15 90.719 kg (200 lb)     Intake/Output Summary (Last 24 hours) at 03/17/15 1309 Last data filed at 03/17/15 1000  Gross per 24 hour   Intake    589 ml  Output    450 ml  Net    139 ml    Physical Exam:   GENERAL: Pleasant-appearing in no apparent distress.  HEAD, EYES, EARS, NOSE AND THROAT: Atraumatic, normocephalic. Extraocular muscles are intact. Pupils equal and reactive to light. Sclerae anicteric. No conjunctival injection. No oro-pharyngeal erythema.  NECK: Supple. There is no jugular venous distention. No bruits, no lymphadenopathy, no thyromegaly.  HEART: Irregularly irregular rate and rhythm, tachycardic. No murmurs, no  rubs, no clicks.  LUNGS: Bilateral wheezing and crackles.  ABDOMEN: Soft, flat, nontender, nondistended. Has good bowel sounds. No hepatosplenomegaly appreciated.  EXTREMITIES: No evidence of any cyanosis, clubbing, or peripheral edema.  +2 pedal and radial pulses bilaterally.  NEUROLOGIC: The patient is alert, awake, and oriented x3 with no focal motor or sensory deficits appreciated bilaterally.  SKIN: Moist and warm with no rashes appreciated.  Psych: Not anxious, depressed LN: No inguinal LN enlargement    Antibiotics   Anti-infectives    Start     Dose/Rate Route Frequency Ordered Stop   03/17/15 1800  Levofloxacin (LEVAQUIN) IVPB 250 mg     250 mg 50 mL/hr over 60 Minutes Intravenous Every 24 hours 03/16/15 2356     03/17/15 0000  levofloxacin (LEVAQUIN) IVPB 500 mg     500 mg 100 mL/hr over 60 Minutes Intravenous  Once 03/16/15 2356 03/17/15 0150   03/16/15 2345  levofloxacin (LEVAQUIN) IVPB 750 mg  Status:  Discontinued     750 mg 100 mL/hr over 90 Minutes Intravenous Every 24 hours 03/16/15 2341 03/16/15 2356      Medications   Scheduled Meds: . antiseptic oral rinse  7 mL Mouth Rinse BID  . diltiazem  180 mg Oral Daily  . furosemide  40 mg Intravenous Q12H  . insulin aspart  0-9 Units Subcutaneous TID WC  . insulin aspart  25 Units Subcutaneous TID WC  . insulin glargine  25 Units Subcutaneous BID  . ipratropium-albuterol  3 mL Nebulization Q6H  . levofloxacin  (LEVAQUIN) IV  250 mg Intravenous Q24H  . lisinopril  5 mg Oral Daily  . meloxicam  7.5 mg Oral BID PC  . methylPREDNISolone (SOLU-MEDROL) injection  60 mg Intravenous Q24H  . metoprolol succinate  100 mg Oral Daily  . rivaroxaban  20 mg Oral Daily  . simvastatin  10 mg Oral Daily  . sodium chloride  3 mL Intravenous Q12H   Continuous Infusions:  PRN Meds:.sodium chloride, acetaminophen **OR** acetaminophen, albuterol, ondansetron **OR** ondansetron (ZOFRAN) IV, senna-docusate, sodium chloride   Data Review:   Micro Results Recent Results (from the past 240 hour(s))  MRSA PCR Screening     Status: None   Collection Time: 03/16/15 11:46 PM  Result Value Ref Range Status   MRSA by PCR NEGATIVE NEGATIVE Final    Comment:        The GeneXpert MRSA Assay (FDA approved for NASAL specimens only), is one component of a comprehensive MRSA colonization surveillance program. It is not intended to diagnose MRSA infection nor to guide or monitor treatment for MRSA infections.     Radiology Reports Dg Chest 1 View  03/16/2015   CLINICAL DATA:  Subsequent encounter for worsening shortness of Breath after emergency room visit earlier today.  EXAM: CHEST  1 VIEW  COMPARISON:  Earlier the same day  FINDINGS: 1941 hours. Low lung volumes. Assessment a Lung bases limited by patient body habitus and portable technique. There is mild vascular congestion without overt airspace pulmonary edema. Probable atelectasis in the lung bases. The cardio pericardial silhouette is enlarged. Bones are diffusely demineralized. Telemetry leads overlie the chest.  IMPRESSION: Limited study secondary to body habitus and portable technique.  Low volume film with enlargement of the cardiopericardial silhouette and vascular congestion.   Electronically Signed   By: Kennith Center M.D.   On: 03/16/2015 19:58   Dg Chest 2 View  03/16/2015   CLINICAL DATA:  Progressive shortness of breath for the past  month  EXAM: CHEST  2  VIEW  COMPARISON:  February 10, 2014  FINDINGS: Cardiomegaly is stable. Pulmonary vascularity is normal. There is patchy opacity in the left lower lobe. The lungs elsewhere clear. No adenopathy. There is degenerative change in the thoracic spine.  IMPRESSION: Patchy opacity in the left lower lobe, likely atelectasis. Lungs elsewhere clear. Heart enlarged with pulmonary vascularity within normal limits.   Electronically Signed   By: Bretta Bang III M.D.   On: 03/16/2015 08:04     CBC  Recent Labs Lab 03/16/15 0759 03/16/15 1913 03/17/15 0530  WBC 10.1 10.7 6.9  HGB 13.5 14.5 13.2  HCT 40.0 43.3 39.1  PLT 181 195 162  MCV 100.3* 101.0* 101.5*  MCH 33.8 33.8 34.2*  MCHC 33.7 33.5 33.7  RDW 14.5 14.6* 14.5  LYMPHSABS  --  0.7*  --   MONOABS  --  0.7  --   EOSABS  --  0.1  --   BASOSABS  --  0.1  --     Chemistries   Recent Labs Lab 03/16/15 0759 03/16/15 1913 03/17/15 0530  NA 141 134* 139  K 5.1 4.8 5.4*  CL 110 105 109  CO2 23 19* 21*  GLUCOSE 142* 142* 198*  BUN 43* 41* 44*  CREATININE 1.42* 1.25* 1.29*  CALCIUM 8.6* 8.4* 8.3*  AST 92*  --   --   ALT 106*  --   --   ALKPHOS 72  --   --   BILITOT 0.6  --   --    ------------------------------------------------------------------------------------------------------------------ estimated creatinine clearance is 43.7 mL/min (by C-G formula based on Cr of 1.29). ------------------------------------------------------------------------------------------------------------------ No results for input(s): HGBA1C in the last 72 hours. ------------------------------------------------------------------------------------------------------------------ No results for input(s): CHOL, HDL, LDLCALC, TRIG, CHOLHDL, LDLDIRECT in the last 72 hours. ------------------------------------------------------------------------------------------------------------------ No results for input(s): TSH, T4TOTAL, T3FREE, THYROIDAB in the last 72  hours.  Invalid input(s): FREET3 ------------------------------------------------------------------------------------------------------------------ No results for input(s): VITAMINB12, FOLATE, FERRITIN, TIBC, IRON, RETICCTPCT in the last 72 hours.  Coagulation profile  Recent Labs Lab 03/16/15 0759  INR 2.34    No results for input(s): DDIMER in the last 72 hours.  Cardiac Enzymes  Recent Labs Lab 03/16/15 0759 03/16/15 1913  TROPONINI <0.03 <0.03   ------------------------------------------------------------------------------------------------------------------ Invalid input(s): POCBNP    Assessment & Plan  Allyse Fregeau is a 81 y.o. female with a known history of COPD and chronic shortness of breath is brought into the ED with worsening of shortness of breath and cough   1. Acute respiratory distress secondary to acute exacerbation of COPD and vascular congestion Continue Solu-Medrol IV daily  Albuterol nebulizer treatments every 4 hours as needed for shortness of breath Levofloxacin IV once daily   2. Acute CHF type unknown, echo pending, change Lasix to IV twice a day  3. Diabetes continue NovoLog pre-meal and Lantus continue on sliding scale insulin   4. Chronic Atrial fibrillation with RVR add Cardizem heart rate still poorly controlled continue Xarelto   5. Essential hypertension continue lisinopril and Toprol   6. Miscellaneous continue Xarelto      Code Status Orders        Start     Ordered   03/16/15 2341  Full code   Continuous     03/16/15 2341           Consultcardiology  DVT Prophylaxis  Xarelto  Lab Results  Component Value Date   PLT 162 03/17/2015      Time Spent in  minutes    35 minutes   Auburn Bilberry M.D on 03/17/2015 at 1:09 PM  Between 7am to 6pm - Pager - [phone]  After 6pm go to [email or web address] - password [email or web address] [facility]  [facility] [place] [facility]   Office  [phone]

## 2015-03-17 NOTE — Care Management Note (Addendum)
Case Management Note  Patient Details  Name: Allison Shepard MRN: [id number] Date of Birth: 03/15/1934  Subjective/Objective:                  Met with patient and her husband to discuss discharge planning. Patient is new to O2 and must qualify for home O2 including a diagnosis of chronic lung disease or CHF. Patient states she is usually independent with ambulation. She has a rollator, front-wheeled walker, and bedside commode in the home. She states she has never needed home health and declines at this time. She is chronically on Xarelto which she states costs around ~$30. She uses [facility] for Rx.    Action/Plan: RNCM will continue to follow.   Expected Discharge Date:                  Expected Discharge Plan:     In-House Referral:     Discharge planning Services  CM Consult  Post Acute Care Choice:  Durable Medical Equipment, Home Health Choice offered to:  Patient  DME Arranged:    DME Agency:     HH Arranged:    [place] Agency:     Status of Service:  In process, will continue to follow  Medicare Important Message Given:    Date Medicare IM Given:    Medicare IM give by:    Date Additional Medicare IM Given:    Additional Medicare Important Message give by:     If discussed at [place] of Stay Meetings, dates discussed:    Additional Comments:  Marshell Garfinkel, RN 03/17/2015, 11:24 AM

## 2015-03-17 NOTE — Progress Notes (Signed)
ANTIBIOTIC CONSULT NOTE - INITIAL  Pharmacy Consult for Levaquin Indication: AECOPD  Allergies  Allergen Reactions  . Hydromorphone Hcl Nausea And Vomiting  . Ibuprofen Other (See Comments)    Pt was told by her MD not to take this medication.    Marland Kitchen Percocet [Oxycodone-Acetaminophen] Hives    Patient Measurements: Height:  (165.1 cm) Weight: 236 lb 8.9 oz (107.3 kg) IBW/kg (Calculated) : 57   Vital Signs: Temp: 97.7 F (36.5 C) (08/02 0730) Temp Source: Oral (08/02 0730) BP: 125/82 mmHg (08/02 1100) Pulse Rate: 111 (08/02 1100) Intake/Output from previous day: 08/01 0701 - 08/02 0700 In: 106 [I.V.:6; IV Piggyback:100] Out: 450 [Urine:450] Intake/Output from this shift: Total I/O In: 483 [P.O.:480; I.V.:3] Out: -   Labs:  Recent Labs  03/16/15 0759 03/16/15 1913 03/17/15 0530  WBC 10.1 10.7 6.9  HGB 13.5 14.5 13.2  PLT 181 195 162  CREATININE 1.42* 1.25* 1.29*   Estimated Creatinine Clearance: 43.7 mL/min (by C-G formula based on Cr of 1.29). No results for input(s): VANCOTROUGH, VANCOPEAK, VANCORANDOM, GENTTROUGH, GENTPEAK, GENTRANDOM, TOBRATROUGH, TOBRAPEAK, TOBRARND, AMIKACINPEAK, AMIKACINTROU, AMIKACIN in the last 72 hours.   Microbiology: Recent Results (from the past 720 hour(s))  MRSA PCR Screening     Status: None   Collection Time: 03/16/15 11:46 PM  Result Value Ref Range Status   MRSA by PCR NEGATIVE NEGATIVE Final    Comment:        The GeneXpert MRSA Assay (FDA approved for NASAL specimens only), is one component of a comprehensive MRSA colonization surveillance program. It is not intended to diagnose MRSA infection nor to guide or monitor treatment for MRSA infections.     Medical History: Past Medical History  Diagnosis Date  . COPD (chronic obstructive pulmonary disease)   . Diabetes mellitus without complication   . Atrial fibrillation   . Hypertension   . Hypercholesteremia     Medications:  Scheduled:  . antiseptic  oral rinse  7 mL Mouth Rinse BID  . diltiazem  180 mg Oral Daily  . furosemide  40 mg Intravenous Daily  . insulin aspart  0-9 Units Subcutaneous TID WC  . insulin aspart  25 Units Subcutaneous TID WC  . insulin glargine  25 Units Subcutaneous BID  . ipratropium-albuterol  3 mL Nebulization Q6H  . levofloxacin (LEVAQUIN) IV  250 mg Intravenous Q24H  . lisinopril  5 mg Oral Daily  . meloxicam  7.5 mg Oral BID PC  . methylPREDNISolone (SOLU-MEDROL) injection  60 mg Intravenous Q6H  . metoprolol succinate  100 mg Oral Daily  . rivaroxaban  20 mg Oral Daily  . simvastatin  10 mg Oral Daily  . sodium chloride  3 mL Intravenous Q12H   Infusions:   PRN: sodium chloride, acetaminophen **OR** acetaminophen, albuterol, ondansetron **OR** ondansetron (ZOFRAN) IV, senna-docusate, sodium chloride  Assessment: 81 y/o F admitted with AECOPD.   Plan:  Continue Levaquin 250 mg iv q 24 h. Will f/u culture results and renal function.   Neddie Steedman G 03/17/2015,11:45 AM

## 2015-03-17 NOTE — Progress Notes (Signed)
   03/17/15 0900  Clinical Encounter Type  Visited With Patient;Health care provider  Visit Type Initial  Referral From Nurse  Consult/Referral To Chaplain  Spiritual Encounters  Spiritual Needs Literature;Brochure  Stress Factors  Patient Stress Factors None identified  Chaplain rounded in unit and offered a compassionate presence and an information packet for a HCPOA/LW. The patient seemed to be interested as well as potentially her husband. I will follow up after her breakfast. Chaplain Zailee Vallely A. Caldwell Kronenberger Ext. [phone]

## 2015-03-18 LAB — CBC
HEMATOCRIT: 39.7 % (ref 35.0–47.0)
Hemoglobin: 13.3 g/dL (ref 12.0–16.0)
MCH: 34.2 pg — AB (ref 26.0–34.0)
MCHC: 33.6 g/dL (ref 32.0–36.0)
MCV: 101.5 fL — ABNORMAL HIGH (ref 80.0–100.0)
Platelets: 171 10*3/uL (ref 150–440)
RBC: 3.91 MIL/uL (ref 3.80–5.20)
RDW: 14.5 % (ref 11.5–14.5)
WBC: 10.4 10*3/uL (ref 3.6–11.0)

## 2015-03-18 LAB — BASIC METABOLIC PANEL
Anion gap: 10 (ref 5–15)
BUN: 64 mg/dL — ABNORMAL HIGH (ref 6–20)
CO2: 23 mmol/L (ref 22–32)
Calcium: 8.6 mg/dL — ABNORMAL LOW (ref 8.9–10.3)
Chloride: 103 mmol/L (ref 101–111)
Creatinine, Ser: 1.76 mg/dL — ABNORMAL HIGH (ref 0.44–1.00)
GFR calc Af Amer: 31 mL/min — ABNORMAL LOW (ref >60)
GFR, EST NON AFRICAN AMERICAN: 27 mL/min — AB (ref >60)
Glucose, Bld: 182 mg/dL — ABNORMAL HIGH (ref 65–99)
Potassium: 5 mmol/L (ref 3.5–5.1)
Sodium: 136 mmol/L (ref 135–145)

## 2015-03-18 LAB — GLUCOSE, CAPILLARY
Glucose-Capillary: 185 mg/dL — ABNORMAL HIGH (ref 65–99)
Glucose-Capillary: 189 mg/dL — ABNORMAL HIGH (ref 65–99)
Glucose-Capillary: 118 mg/dL — ABNORMAL HIGH (ref 65–99)
Glucose-Capillary: 206 mg/dL — ABNORMAL HIGH (ref 65–99)

## 2015-03-18 MED ORDER — SENNA 8.6 MG PO TABS
1.0000 | ORAL_TABLET | Freq: Every day | ORAL | Status: DC
  Administered 2015-03-18 – 2015-03-20 (×3): 8.6 mg via ORAL
  Filled 2015-03-18 (×3): qty 1

## 2015-03-18 MED ORDER — TRAZODONE HCL 50 MG PO TABS
25.0000 mg | ORAL_TABLET | Freq: Every evening | ORAL | Status: DC | PRN
  Administered 2015-03-19 (×2): 25 mg via ORAL
  Filled 2015-03-18 (×2): qty 1

## 2015-03-18 MED ORDER — ACETYLCYSTEINE 20 % IN SOLN
4.0000 mL | Freq: Two times a day (BID) | RESPIRATORY_TRACT | Status: DC
  Administered 2015-03-18 – 2015-03-19 (×2): 4 mL via RESPIRATORY_TRACT
  Filled 2015-03-18 (×2): qty 4

## 2015-03-18 MED ORDER — DOCUSATE SODIUM 100 MG PO CAPS
200.0000 mg | ORAL_CAPSULE | Freq: Two times a day (BID) | ORAL | Status: DC
  Administered 2015-03-18 – 2015-03-20 (×4): 200 mg via ORAL
  Filled 2015-03-18 (×4): qty 2

## 2015-03-18 NOTE — Progress Notes (Signed)
Alert and oriented. Afib on telemetry. Rested quietly most of the day. No complaints. Family at bedside part of today. Full assessment as charted. Possible discharge tomorrow.  Allison Shepard

## 2015-03-18 NOTE — Care Management Important Message (Signed)
Important Message  Patient Details  Name: IASIA FORCIER MRN: [id number] Date of Birth: 09/15/1933   Medicare Important Message Given:  Yes-second notification given    [name] 03/18/2015, 10:35 AM

## 2015-03-18 NOTE — Progress Notes (Signed)
Dr. America Brown office notified by Dois Davenport (unit clerk) to request outpatient Echo results per Dr. Eliane Decree order. Dr. America Brown office will send the results after he reads the Echo.  Allison Shepard

## 2015-03-18 NOTE — Clinical Documentation Improvement (Signed)
Would you please help clarify medical condition related to clinical findings?   Acute kidney failure with cortical necrosis  Acute kidney failure with medullary (papillary) necrosis  Acute kidney failure with tubular necrosis  Or acute on chronic CKD and would need stage?  _______CKD Stage I - GFR > OR = 90 _______CKD Stage II - GFR 60-80 _______CKD Stage III - GFR 30-59 _______CKD Stage IV - GFR 15-29 _______CKD Stage V - GFR < 15 _______ESRD (End Stage Renal Disease) _______Other condition_____________ _______Cannot Clinically determine   Clinical information:  Pt admitted with BUN of 43, 41, 44,  and today = 64.  Creatinine - 1.42, 1.25, 1.29, and today at 64.  GFR = started at 40 and now is 31.   Thank You, Harrie Jeans ,RN Clinical Documentation Specialist:    [facility]- Health Information Management [phone] Cell - [phone]

## 2015-03-18 NOTE — Progress Notes (Signed)
Pt rang out to request "another sleeping pill"  Dr. Anne Hahn on call made aware.

## 2015-03-18 NOTE — Progress Notes (Signed)
[facility] - [place] at [facility]                                                                                                                                                                                            Patient Demographics   Allison Shepard, is a 81 y.o. female, DOB - 12-17-1933, ZOX:[id number]  Admit date - 03/16/2015   Admitting Physician Ramonita Lab, MD  Outpatient Primary MD for the patient is THIES, DAVID, MD   LOS - 2  Subjective: Still short of breath states that unable to break up or cough. Her creatinine has increased.     Review of Systems:   CONSTITUTIONAL: No documented fever. No fatigue, weakness. No weight gain, no weight loss.  EYES: No blurry or double vision.  ENT: No tinnitus. No postnasal drip. No redness of the oropharynx.  RESPIRATORY: Positive cough, positive wheeze, no hemoptysis. Positive dyspnea.  CARDIOVASCULAR: No chest pain. No orthopnea. No palpitations. No syncope.  GASTROINTESTINAL: No nausea, no vomiting or diarrhea. No abdominal pain. No melena or hematochezia.  GENITOURINARY: No dysuria or hematuria.  ENDOCRINE: No polyuria or nocturia. No heat or cold intolerance.  HEMATOLOGY: No anemia. No bruising. No bleeding.  INTEGUMENTARY: No rashes. No lesions.  MUSCULOSKELETAL: No arthritis. No swelling. No gout.  NEUROLOGIC: No numbness, tingling, or ataxia. No seizure-type activity.  PSYCHIATRIC: No anxiety. No insomnia. No ADD.    Vitals:   Filed Vitals:   03/18/15 0432 03/18/15 0823 03/18/15 0943 03/18/15 1134  BP: 155/91 115/83 100/66 121/85  Pulse: 86 89 74 86  Temp: 97.9 F (36.6 C)   97.7 F (36.5 C)  TempSrc: Oral   Oral  Resp: 25   18  Height:      Weight:      SpO2: 95%   97%    Wt Readings from Last 3 Encounters:  03/17/15 113.172 kg (249 lb 8 oz)  03/16/15 90.719 kg (200 lb)  03/13/15 90.719 kg (200 lb)     Intake/Output Summary (Last 24 hours) at 03/18/15 1311 Last data  filed at 03/17/15 1741  Gross per 24 hour  Intake     50 ml  Output      0 ml  Net     50 ml    Physical Exam:   GENERAL: Pleasant-appearing in no apparent distress.  HEAD, EYES, EARS, NOSE AND THROAT: Atraumatic, normocephalic. Extraocular muscles are intact. Pupils equal and reactive to light. Sclerae anicteric. No conjunctival injection. No oro-pharyngeal erythema.  NECK: Supple. There is no jugular venous distention. No bruits, no lymphadenopathy, no thyromegaly.  HEART: Irregularly irregular  rate and rhythm, tachycardic. No murmurs, no rubs, no clicks.  LUNGS: Bilateral wheezing and crackles.  ABDOMEN: Soft, flat, nontender, nondistended. Has good bowel sounds. No hepatosplenomegaly appreciated.  EXTREMITIES: No evidence of any cyanosis, clubbing, or peripheral edema.  +2 pedal and radial pulses bilaterally.  NEUROLOGIC: The patient is alert, awake, and oriented x3 with no focal motor or sensory deficits appreciated bilaterally.  SKIN: Moist and warm with no rashes appreciated.  Psych: Not anxious, depressed LN: No inguinal LN enlargement    Antibiotics   Anti-infectives    Start     Dose/Rate Route Frequency Ordered Stop   03/17/15 1800  Levofloxacin (LEVAQUIN) IVPB 250 mg     250 mg 50 mL/hr over 60 Minutes Intravenous Every 24 hours 03/16/15 2356     03/17/15 0000  levofloxacin (LEVAQUIN) IVPB 500 mg     500 mg 100 mL/hr over 60 Minutes Intravenous  Once 03/16/15 2356 03/17/15 0150   03/16/15 2345  levofloxacin (LEVAQUIN) IVPB 750 mg  Status:  Discontinued     750 mg 100 mL/hr over 90 Minutes Intravenous Every 24 hours 03/16/15 2341 03/16/15 2356      Medications   Scheduled Meds: . acetylcysteine  4 mL Nebulization BID  . antiseptic oral rinse  7 mL Mouth Rinse BID  . diltiazem  180 mg Oral Daily  . docusate sodium  200 mg Oral BID  . fluticasone  1 spray Each Nare Daily  . guaiFENesin  600 mg Oral BID  . insulin aspart  0-9 Units Subcutaneous TID WC  .  insulin aspart  25 Units Subcutaneous TID WC  . insulin glargine  25 Units Subcutaneous BID  . ipratropium-albuterol  3 mL Nebulization Q6H  . levofloxacin (LEVAQUIN) IV  250 mg Intravenous Q24H  . lisinopril  5 mg Oral Daily  . meloxicam  7.5 mg Oral BID PC  . methylPREDNISolone (SOLU-MEDROL) injection  60 mg Intravenous Q24H  . metoprolol succinate  100 mg Oral Daily  . rivaroxaban  20 mg Oral Daily  . senna  1 tablet Oral Daily  . simvastatin  10 mg Oral Daily  . sodium chloride  3 mL Intravenous Q12H   Continuous Infusions:  PRN Meds:.sodium chloride, acetaminophen **OR** acetaminophen, albuterol, diphenhydrAMINE, ondansetron **OR** ondansetron (ZOFRAN) IV, senna-docusate, sodium chloride   Data Review:   Micro Results Recent Results (from the past 240 hour(s))  MRSA PCR Screening     Status: None   Collection Time: 03/16/15 11:46 PM  Result Value Ref Range Status   MRSA by PCR NEGATIVE NEGATIVE Final    Comment:        The GeneXpert MRSA Assay (FDA approved for NASAL specimens only), is one component of a comprehensive MRSA colonization surveillance program. It is not intended to diagnose MRSA infection nor to guide or monitor treatment for MRSA infections.     Radiology Reports Dg Chest 1 View  03/16/2015   CLINICAL DATA:  Subsequent encounter for worsening shortness of Breath after emergency room visit earlier today.  EXAM: CHEST  1 VIEW  COMPARISON:  Earlier the same day  FINDINGS: 1941 hours. Low lung volumes. Assessment a Lung bases limited by patient body habitus and portable technique. There is mild vascular congestion without overt airspace pulmonary edema. Probable atelectasis in the lung bases. The cardio pericardial silhouette is enlarged. Bones are diffusely demineralized. Telemetry leads overlie the chest.  IMPRESSION: Limited study secondary to body habitus and portable technique.  Low volume film with enlargement of  the cardiopericardial silhouette and  vascular congestion.   Electronically Signed   By: Kennith Center M.D.   On: 03/16/2015 19:58   Dg Chest 2 View  03/16/2015   CLINICAL DATA:  Progressive shortness of breath for the past month  EXAM: CHEST  2 VIEW  COMPARISON:  February 10, 2014  FINDINGS: Cardiomegaly is stable. Pulmonary vascularity is normal. There is patchy opacity in the left lower lobe. The lungs elsewhere clear. No adenopathy. There is degenerative change in the thoracic spine.  IMPRESSION: Patchy opacity in the left lower lobe, likely atelectasis. Lungs elsewhere clear. Heart enlarged with pulmonary vascularity within normal limits.   Electronically Signed   By: Bretta Bang III M.D.   On: 03/16/2015 08:04     CBC  Recent Labs Lab 03/16/15 0759 03/16/15 1913 03/17/15 0530 03/18/15 0443  WBC 10.1 10.7 6.9 10.4  HGB 13.5 14.5 13.2 13.3  HCT 40.0 43.3 39.1 39.7  PLT 181 195 162 171  MCV 100.3* 101.0* 101.5* 101.5*  MCH 33.8 33.8 34.2* 34.2*  MCHC 33.7 33.5 33.7 33.6  RDW 14.5 14.6* 14.5 14.5  LYMPHSABS  --  0.7*  --   --   MONOABS  --  0.7  --   --   EOSABS  --  0.1  --   --   BASOSABS  --  0.1  --   --     Chemistries   Recent Labs Lab 03/16/15 0759 03/16/15 1913 03/17/15 0530 03/18/15 0443  NA 141 134* 139 136  K 5.1 4.8 5.4* 5.0  CL 110 105 109 103  CO2 23 19* 21* 23  GLUCOSE 142* 142* 198* 182*  BUN 43* 41* 44* 64*  CREATININE 1.42* 1.25* 1.29* 1.76*  CALCIUM 8.6* 8.4* 8.3* 8.6*  AST 92*  --   --   --   ALT 106*  --   --   --   ALKPHOS 72  --   --   --   BILITOT 0.6  --   --   --    ------------------------------------------------------------------------------------------------------------------ estimated creatinine clearance is 33.1 mL/min (by C-G formula based on Cr of 1.76). ------------------------------------------------------------------------------------------------------------------  Recent Labs  03/17/15 0530  HGBA1C 7.2*    ------------------------------------------------------------------------------------------------------------------ No results for input(s): CHOL, HDL, LDLCALC, TRIG, CHOLHDL, LDLDIRECT in the last 72 hours. ------------------------------------------------------------------------------------------------------------------ No results for input(s): TSH, T4TOTAL, T3FREE, THYROIDAB in the last 72 hours.  Invalid input(s): FREET3 ------------------------------------------------------------------------------------------------------------------ No results for input(s): VITAMINB12, FOLATE, FERRITIN, TIBC, IRON, RETICCTPCT in the last 72 hours.  Coagulation profile  Recent Labs Lab 03/16/15 0759  INR 2.34    No results for input(s): DDIMER in the last 72 hours.  Cardiac Enzymes  Recent Labs Lab 03/16/15 0759 03/16/15 1913  TROPONINI <0.03 <0.03   ------------------------------------------------------------------------------------------------------------------ Invalid input(s): POCBNP    Assessment & Plan  Allison Shepard is a 81 y.o. female with a known history of COPD and chronic shortness of breath is brought into the ED with worsening of shortness of breath and cough   1. Acute respiratory distress secondary to acute exacerbation of COPD and vascular congestion Continue Solu-Medrol IV daily  Albuterol nebulizer treatments every 4 hours as needed for shortness of breath Levofloxacin IV once daily   2. Acute CHF type unknown, echo done and can oral clinic 1 week ago but has not been read yet we'll try to obtain the results, due to creatinine increasing we'll stop the IV Lasix  3. Diabetes continue NovoLog pre-meal and Lantus continue on  sliding scale insulin   4. Chronic Atrial fibrillation with RVR continue Xarelto and Cardizem heart rate stable   5. Essential hypertension continue lisinopril and Toprol   6. Miscellaneous continue Xarelto for DVT prophylaxis       Code Status Orders        Start     Ordered   03/16/15 2341  Full code   Continuous     03/16/15 2341           Consultcardiology  DVT Prophylaxis  Xarelto  Lab Results  Component Value Date   PLT 171 03/18/2015      Time Spent in minutes      Auburn Bilberry M.D on 03/18/2015 at 1:11 PM  Between 7am to 6pm - Pager - [phone]  After 6pm go to [email or web address] - password [email or web address] [facility]  [facility]   Office  [phone]

## 2015-03-18 NOTE — Clinical Documentation Improvement (Signed)
Would you please specify the atrial fib. Noted on EKG?  Please clarify the type of atrial fibrillation: ? Chronic ? Paroxysmal ? Permanent ? Persistent ? Other (please specify type)________________________ ? Unable to determine ? Unknown  ?    Thank You, Harrie Jeans ,RN Clinical Documentation Specialist:    [facility]- Health Information Management [phone] Cell - [phone]

## 2015-03-18 NOTE — Progress Notes (Signed)
RN made aware by tele clerk that patient had 2 second pause earlier today. Prime MD paged.

## 2015-03-18 NOTE — Progress Notes (Signed)
Dr. Elisabeth Pigeon made aware of pause on telemetry. No interventions at this time. Continue to monitor.

## 2015-03-19 LAB — BASIC METABOLIC PANEL
Anion gap: 5 (ref 5–15)
BUN: 70 mg/dL — ABNORMAL HIGH (ref 6–20)
CO2: 24 mmol/L (ref 22–32)
Calcium: 8.4 mg/dL — ABNORMAL LOW (ref 8.9–10.3)
Chloride: 106 mmol/L (ref 101–111)
Creatinine, Ser: 1.63 mg/dL — ABNORMAL HIGH (ref 0.44–1.00)
GFR calc Af Amer: 34 mL/min — ABNORMAL LOW (ref >60)
GFR calc non Af Amer: 29 mL/min — ABNORMAL LOW (ref >60)
Glucose, Bld: 138 mg/dL — ABNORMAL HIGH (ref 65–99)
Potassium: 5 mmol/L (ref 3.5–5.1)
SODIUM: 135 mmol/L (ref 135–145)

## 2015-03-19 LAB — CBC
HEMATOCRIT: 38.8 % (ref 35.0–47.0)
HEMOGLOBIN: 13.4 g/dL (ref 12.0–16.0)
MCH: 34.9 pg — ABNORMAL HIGH (ref 26.0–34.0)
MCHC: 34.6 g/dL (ref 32.0–36.0)
MCV: 100.7 fL — ABNORMAL HIGH (ref 80.0–100.0)
Platelets: 177 10*3/uL (ref 150–440)
RBC: 3.85 MIL/uL (ref 3.80–5.20)
RDW: 14.9 % — ABNORMAL HIGH (ref 11.5–14.5)
WBC: 9.8 10*3/uL (ref 3.6–11.0)

## 2015-03-19 LAB — GLUCOSE, CAPILLARY
Glucose-Capillary: 133 mg/dL — ABNORMAL HIGH (ref 65–99)
Glucose-Capillary: 160 mg/dL — ABNORMAL HIGH (ref 65–99)
Glucose-Capillary: 190 mg/dL — ABNORMAL HIGH (ref 65–99)
Glucose-Capillary: 209 mg/dL — ABNORMAL HIGH (ref 65–99)

## 2015-03-19 NOTE — Evaluation (Signed)
Physical Therapy Evaluation Patient Details Name: Allison Shepard MRN: [id number] DOB: 11-Aug-1934 Today's Date: 03/19/2015   History of Present Illness  81 yo female with respriatory distress, from COPD and vascular congestion  Clinical Impression  SATURATION QUALIFICATIONS: (This note is used to comply with regulatory documentation for home oxygen)  Patient Saturations on Room Air at Rest = 91%  Patient Saturations on 2L O2 at  Rest = 97%  Patient Saturations on Room Air while Ambulating = 83%  Patient Saturations on 2L O2 while ambulating = 95%  Pulses on 2L O2 at rest = 97 Pulses  On room air after gait: 115-139  Please briefly explain why patient needs home oxygen:  Both pulses and O2 sat are uncontrolled and critically out of range off O2.    Follow Up Recommendations SNF    Equipment Recommendations  None recommended by PT    Recommendations for Other Services       Precautions / Restrictions Precautions Precautions: Fall Precaution Comments: pulses off O2 are 115 to 139 after walk Restrictions Weight Bearing Restrictions: No      Mobility  Bed Mobility Overal bed mobility: Needs Assistance Bed Mobility: Supine to Sit;Sit to Supine     Supine to sit: Min assist Sit to supine: Supervision   General bed mobility comments: HOB elevated to get out and helped with trunk control  Transfers Overall transfer level: Needs assistance Equipment used: 1 person hand held assist Transfers: Sit to/from Stand;Stand Pivot Transfers Sit to Stand: Min assist Stand pivot transfers: Min guard          Ambulation/Gait Ambulation/Gait assistance: Min guard Ambulation Distance (Feet): 125 Feet Assistive device: 1 person hand held assist (rolling cart with pulse ox to ck vitals with gait) Gait Pattern/deviations: Step-through pattern;Shuffle;Wide base of support;Trunk flexed Gait velocity: reduced Gait velocity interpretation: Below normal speed for age/gender     Stairs            Wheelchair Mobility    Modified Rankin (Stroke Patients Only)       Balance Overall balance assessment: Needs assistance Sitting-balance support: Feet supported Sitting balance-Leahy Scale: Good   Postural control: Posterior lean Standing balance support: Bilateral upper extremity supported Standing balance-Leahy Scale: Fair Standing balance comment: fair- dynamic balance                             Pertinent Vitals/Pain Pain Assessment: No/denies pain    Home Living Family/patient expects to be discharged to:: Private residence Living Arrangements: Spouse/significant other Available Help at Discharge: Family;Available 24 hours/day Type of Home: House Home Access: Stairs to enter Entrance Stairs-Rails: Left Entrance Stairs-Number of Steps: 2 Home Layout: One level Home Equipment: Walker - 2 wheels;Cane - single point;Bedside commode;Shower seat      Prior Function Level of Independence: Independent               Hand Dominance        Extremity/Trunk Assessment   Upper Extremity Assessment: Overall WFL for tasks assessed           Lower Extremity Assessment: Generalized weakness      Cervical / Trunk Assessment: Normal  Communication   Communication: No difficulties  Cognition Arousal/Alertness: Awake/alert Behavior During Therapy: WFL for tasks assessed/performed Overall Cognitive Status: Within Functional Limits for tasks assessed                      General  Comments General comments (skin integrity, edema, etc.): Pt is having some struggle with gait and managing her pulse and O2 sats without O2.  Had drop in sat to 83% with walk and pulses up to 115 with height at 139 at rest afterward    Exercises        Assessment/Plan    PT Assessment Patient needs continued PT services  PT Diagnosis Difficulty walking   PT Problem List Decreased strength;Decreased range of motion;Decreased activity  tolerance;Decreased balance;Decreased mobility;Decreased coordination;Decreased knowledge of use of DME;Cardiopulmonary status limiting activity;Decreased skin integrity  PT Treatment Interventions DME instruction;Gait training;Functional mobility training;Therapeutic activities;Therapeutic exercise;Balance training;Neuromuscular re-education;Patient/family education;Stair training   PT Goals (Current goals can be found in the Care Plan section) Acute Rehab PT Goals Patient Stated Goal: to go home for open house  with husband to assist her PT Goal Formulation: With patient Time For Goal Achievement: 04/02/15 Potential to Achieve Goals: Good    Frequency Min 2X/week   Barriers to discharge Inaccessible home environment      Co-evaluation               End of Session Equipment Utilized During Treatment: Gait belt Activity Tolerance: Patient limited by lethargy;Patient limited by fatigue;Treatment limited secondary to medical complications (Comment);Other (comment) (drops in sat and elevation of pulse wtih dizziness) Patient left: in bed;with call bell/phone within reach;with bed alarm set Nurse Communication: Mobility status;Other (comment) (vital fluctuations)         Time: 9604-5409 PT Time Calculation (min) (ACUTE ONLY): 24 min   Charges:   PT Evaluation $Initial PT Evaluation Tier I: 1 Procedure PT Treatments $Gait Training: 8-22 mins   PT G Codes:        Ivar Drape 2015-04-01, 2:10 PM   Samul Dada, PT MS Acute Rehab Dept. Number: [id number] and [facility] [phone]

## 2015-03-19 NOTE — Progress Notes (Signed)
[facility] - [place] at [facility]                                                                                                                                                                                            Patient Demographics   Allison Shepard, is a 81 y.o. female, DOB - [birth year], WUJ:[id number]  Admit date - 03/16/2015   Admitting Physician Ramonita Lab, MD  Outpatient Primary MD for the patient is THIES, DAVID, MD   LOS - 3  Subjective: Very anxious to go home. I ambulated the patient without oxygen sats did drop into the 70s. She denies any chest pain but does report that breathing is improved  Review of Systems:   CONSTITUTIONAL: No documented fever. No fatigue, weakness. No weight gain, no weight loss.  EYES: No blurry or double vision.  ENT: No tinnitus. No postnasal drip. No redness of the oropharynx.  RESPIRATORY: Positive cough, positive wheeze, no hemoptysis. Positive dyspnea.  CARDIOVASCULAR: No chest pain. No orthopnea. No palpitations. No syncope.  GASTROINTESTINAL: No nausea, no vomiting or diarrhea. No abdominal pain. No melena or hematochezia.  GENITOURINARY: No dysuria or hematuria.  ENDOCRINE: No polyuria or nocturia. No heat or cold intolerance.  HEMATOLOGY: No anemia. No bruising. No bleeding.  INTEGUMENTARY: No rashes. No lesions.  MUSCULOSKELETAL: No arthritis. No swelling. No gout.  NEUROLOGIC: No numbness, tingling, or ataxia. No seizure-type activity.  PSYCHIATRIC: No anxiety. No insomnia. No ADD.    Vitals:   Filed Vitals:   03/19/15 0213 03/19/15 0340 03/19/15 0519 03/19/15 1152  BP:  105/57  119/93  Pulse:  87 97   Temp:    97.8 F (36.6 C)  TempSrc:      Resp:  20  17  Height:      Weight:      SpO2: 96% 99% 94% 90%    Wt Readings from Last 3 Encounters:  03/17/15 113.172 kg (249 lb 8 oz)  03/16/15 90.719 kg (200 lb)  03/13/15 90.719 kg (200 lb)     Intake/Output Summary (Last 24 hours) at  03/19/15 1158 Last data filed at 03/19/15 0938  Gross per 24 hour  Intake    290 ml  Output    200 ml  Net     90 ml    Physical Exam:   GENERAL: Pleasant-appearing in no apparent distress.  HEAD, EYES, EARS, NOSE AND THROAT: Atraumatic, normocephalic. Extraocular muscles are intact. Pupils equal and reactive to light. Sclerae anicteric. No conjunctival injection. No oro-pharyngeal erythema.  NECK: Supple. There is no jugular venous distention. No bruits, no lymphadenopathy, no thyromegaly.  HEART: Irregularly irregular rate and rhythm, tachycardic. No murmurs, no rubs, no clicks.  LUNGS: Bilateral wheezing and crackles.  ABDOMEN: Soft, flat, nontender, nondistended. Has good bowel sounds. No hepatosplenomegaly appreciated.  EXTREMITIES: No evidence of any cyanosis, clubbing, or peripheral edema.  +2 pedal and radial pulses bilaterally.  NEUROLOGIC: The patient is alert, awake, and oriented x3 with no focal motor or sensory deficits appreciated bilaterally.  SKIN: Moist and warm with no rashes appreciated.  Psych: Not anxious, depressed LN: No inguinal LN enlargement    Antibiotics   Anti-infectives    Start     Dose/Rate Route Frequency Ordered Stop   03/17/15 1800  Levofloxacin (LEVAQUIN) IVPB 250 mg     250 mg 50 mL/hr over 60 Minutes Intravenous Every 24 hours 03/16/15 2356     03/17/15 0000  levofloxacin (LEVAQUIN) IVPB 500 mg     500 mg 100 mL/hr over 60 Minutes Intravenous  Once 03/16/15 2356 03/17/15 0150   03/16/15 2345  levofloxacin (LEVAQUIN) IVPB 750 mg  Status:  Discontinued     750 mg 100 mL/hr over 90 Minutes Intravenous Every 24 hours 03/16/15 2341 03/16/15 2356      Medications   Scheduled Meds: . acetylcysteine  4 mL Nebulization BID  . antiseptic oral rinse  7 mL Mouth Rinse BID  . diltiazem  180 mg Oral Daily  . docusate sodium  200 mg Oral BID  . fluticasone  1 spray Each Nare Daily  . guaiFENesin  600 mg Oral BID  . insulin aspart  0-9 Units  Subcutaneous TID WC  . insulin aspart  25 Units Subcutaneous TID WC  . insulin glargine  25 Units Subcutaneous BID  . ipratropium-albuterol  3 mL Nebulization Q6H  . levofloxacin (LEVAQUIN) IV  250 mg Intravenous Q24H  . lisinopril  5 mg Oral Daily  . meloxicam  7.5 mg Oral BID PC  . methylPREDNISolone (SOLU-MEDROL) injection  60 mg Intravenous Q24H  . metoprolol succinate  100 mg Oral Daily  . rivaroxaban  20 mg Oral Daily  . senna  1 tablet Oral Daily  . simvastatin  10 mg Oral Daily  . sodium chloride  3 mL Intravenous Q12H   Continuous Infusions:  PRN Meds:.sodium chloride, acetaminophen **OR** acetaminophen, albuterol, diphenhydrAMINE, ondansetron **OR** ondansetron (ZOFRAN) IV, senna-docusate, sodium chloride, traZODone   Data Review:   Micro Results Recent Results (from the past 240 hour(s))  MRSA PCR Screening     Status: None   Collection Time: 03/16/15 11:46 PM  Result Value Ref Range Status   MRSA by PCR NEGATIVE NEGATIVE Final    Comment:        The GeneXpert MRSA Assay (FDA approved for NASAL specimens only), is one component of a comprehensive MRSA colonization surveillance program. It is not intended to diagnose MRSA infection nor to guide or monitor treatment for MRSA infections.     Radiology Reports Dg Chest 1 View  03/16/2015   CLINICAL DATA:  Subsequent encounter for worsening shortness of Breath after emergency room visit earlier today.  EXAM: CHEST  1 VIEW  COMPARISON:  Earlier the same day  FINDINGS: 1941 hours. Low lung volumes. Assessment a Lung bases limited by patient body habitus and portable technique. There is mild vascular congestion without overt airspace pulmonary edema. Probable atelectasis in the lung bases. The cardio pericardial silhouette is enlarged. Bones are diffusely demineralized. Telemetry leads overlie the chest.  IMPRESSION: Limited study secondary to body habitus and portable technique.  Low volume  film with enlargement of the  cardiopericardial silhouette and vascular congestion.   Electronically Signed   By: Kennith Center M.D.   On: 03/16/2015 19:58   Dg Chest 2 View  03/16/2015   CLINICAL DATA:  Progressive shortness of breath for the past month  EXAM: CHEST  2 VIEW  COMPARISON:  February 10, 2014  FINDINGS: Cardiomegaly is stable. Pulmonary vascularity is normal. There is patchy opacity in the left lower lobe. The lungs elsewhere clear. No adenopathy. There is degenerative change in the thoracic spine.  IMPRESSION: Patchy opacity in the left lower lobe, likely atelectasis. Lungs elsewhere clear. Heart enlarged with pulmonary vascularity within normal limits.   Electronically Signed   By: Bretta Bang III M.D.   On: 03/16/2015 08:04     CBC  Recent Labs Lab 03/16/15 0759 03/16/15 1913 03/17/15 0530 03/18/15 0443 03/19/15 0447  WBC 10.1 10.7 6.9 10.4 9.8  HGB 13.5 14.5 13.2 13.3 13.4  HCT 40.0 43.3 39.1 39.7 38.8  PLT 181 195 162 171 177  MCV 100.3* 101.0* 101.5* 101.5* 100.7*  MCH 33.8 33.8 34.2* 34.2* 34.9*  MCHC 33.7 33.5 33.7 33.6 34.6  RDW 14.5 14.6* 14.5 14.5 14.9*  LYMPHSABS  --  0.7*  --   --   --   MONOABS  --  0.7  --   --   --   EOSABS  --  0.1  --   --   --   BASOSABS  --  0.1  --   --   --     Chemistries   Recent Labs Lab 03/16/15 0759 03/16/15 1913 03/17/15 0530 03/18/15 0443 03/19/15 0447  NA 141 134* 139 136 135  K 5.1 4.8 5.4* 5.0 5.0  CL 110 105 109 103 106  CO2 23 19* 21* 23 24  GLUCOSE 142* 142* 198* 182* 138*  BUN 43* 41* 44* 64* 70*  CREATININE 1.42* 1.25* 1.29* 1.76* 1.63*  CALCIUM 8.6* 8.4* 8.3* 8.6* 8.4*  AST 92*  --   --   --   --   ALT 106*  --   --   --   --   ALKPHOS 72  --   --   --   --   BILITOT 0.6  --   --   --   --    ------------------------------------------------------------------------------------------------------------------ estimated creatinine clearance is 35.7 mL/min (by C-G formula based on Cr of  1.63). ------------------------------------------------------------------------------------------------------------------  Recent Labs  03/17/15 0530  HGBA1C 7.2*   ------------------------------------------------------------------------------------------------------------------ No results for input(s): CHOL, HDL, LDLCALC, TRIG, CHOLHDL, LDLDIRECT in the last 72 hours. ------------------------------------------------------------------------------------------------------------------ No results for input(s): TSH, T4TOTAL, T3FREE, THYROIDAB in the last 72 hours.  Invalid input(s): FREET3 ------------------------------------------------------------------------------------------------------------------ No results for input(s): VITAMINB12, FOLATE, FERRITIN, TIBC, IRON, RETICCTPCT in the last 72 hours.  Coagulation profile  Recent Labs Lab 03/16/15 0759  INR 2.34    No results for input(s): DDIMER in the last 72 hours.  Cardiac Enzymes  Recent Labs Lab 03/16/15 0759 03/16/15 1913  TROPONINI <0.03 <0.03   ------------------------------------------------------------------------------------------------------------------ Invalid input(s): POCBNP    Assessment & Plan  Allison Shepard is a 81 y.o. female with a known history of COPD and chronic shortness of breath is brought into the ED with worsening of shortness of breath and cough   1. Acute respiratory distress secondary to acute exacerbation of COPD and vascular congestion Continue Solu-Medrol IV daily  Albuterol nebulizer treatments every 4 hours as needed for shortness of breath Levofloxacin IV once  daily Anticipated likely being discharged tomorrow   2. Acute CHF type unknown, echo done and  [facility] 1 week ago but has not been read yet we'll try to obtain the results,   Po lasix  3. Diabetes continue NovoLog pre-meal and Lantus continue on sliding scale insulin   4. Chronic Atrial fibrillation with RVR  continue Xarelto and Cardizem heart rate stable   5. Essential hypertension continue lisinopril and Toprol   6. Chronic renal failure monitor renal function      Code Status Orders        Start     Ordered   03/16/15 2341  Full code   Continuous     03/16/15 2341             DVT Prophylaxis  Xarelto  Lab Results  Component Value Date   PLT 177 03/19/2015      Time Spent in minutes      Auburn Bilberry M.D on 03/19/2015 at 11:58 AM  Between 7am to 6pm - Pager - [phone]  After 6pm go to [email or web address] - password [email or web address] [facility]  [facility]   Office  [phone]

## 2015-03-19 NOTE — Care Management (Signed)
Patient at present has qualified for home 02.  Physical therapy has recommended directly to patient that would benefit from skilled nursing.  Patient does not want to pursue this recommendation.  She wants to discharge home.

## 2015-03-19 NOTE — Clinical Documentation Improvement (Signed)
Would you please indicate stage of CKD?  Documented in record to have chronic CKD and to watch renal function.  _______CKD Stage I - GFR > OR = 90 _______CKD Stage II - GFR 60-80 _______CKD Stage III - GFR 30-59 _______CKD Stage IV - GFR 15-29 _______CKD Stage V - GFR < 15 _______ESRD (End Stage Renal Disease) _______Other condition_____________ _______Cannot Clinically determine    Thank You, Harrie Jeans ,RN Clinical Documentation Specialist:    [facility]- Health Information Management [phone] Cell - [phone]

## 2015-03-20 DIAGNOSIS — I482 Chronic atrial fibrillation, unspecified: Secondary | ICD-10-CM | POA: Insufficient documentation

## 2015-03-20 LAB — BASIC METABOLIC PANEL
ANION GAP: 6 (ref 5–15)
BUN: 67 mg/dL — AB (ref 6–20)
CHLORIDE: 108 mmol/L (ref 101–111)
CO2: 23 mmol/L (ref 22–32)
Calcium: 8.5 mg/dL — ABNORMAL LOW (ref 8.9–10.3)
Creatinine, Ser: 1.45 mg/dL — ABNORMAL HIGH (ref 0.44–1.00)
GFR calc Af Amer: 39 mL/min — ABNORMAL LOW (ref >60)
GFR, EST NON AFRICAN AMERICAN: 34 mL/min — AB (ref >60)
Glucose, Bld: 159 mg/dL — ABNORMAL HIGH (ref 65–99)
POTASSIUM: 5.2 mmol/L — AB (ref 3.5–5.1)
Sodium: 137 mmol/L (ref 135–145)

## 2015-03-20 LAB — GLUCOSE, CAPILLARY
GLUCOSE-CAPILLARY: 125 mg/dL — AB (ref 65–99)
Glucose-Capillary: 197 mg/dL — ABNORMAL HIGH (ref 65–99)

## 2015-03-20 MED ORDER — GUAIFENESIN ER 600 MG PO TB12: 600.0000 mg | ORAL_TABLET | Freq: Two times a day (BID) | ORAL | Status: DC

## 2015-03-20 MED ORDER — LEVOFLOXACIN 250 MG PO TABS: 250.0000 mg | ORAL_TABLET | Freq: Every day | ORAL | Status: AC

## 2015-03-20 MED ORDER — FLUTICASONE PROPIONATE 50 MCG/ACT NA SUSP: 1.0000 | Freq: Every day | NASAL | Status: DC

## 2015-03-20 MED ORDER — FUROSEMIDE 20 MG PO TABS: 20.0000 mg | ORAL_TABLET | Freq: Every day | ORAL | Status: DC

## 2015-03-20 MED ORDER — PREDNISONE 10 MG (21) PO TBPK: 10.0000 mg | ORAL_TABLET | Freq: Every day | ORAL | Status: DC

## 2015-03-20 MED ORDER — LEVOFLOXACIN 250 MG PO TABS: 250.0000 mg | ORAL_TABLET | Freq: Every day | ORAL | Status: DC

## 2015-03-20 NOTE — Care Management (Signed)
Patient is firmly insistent on discharging home today because she is selling her house and there is an Open House scheduled for Sunday.   She is agreeable to home health and agency choice is [facility].  Received order and face to face for  New home oxygen, SN PT OT  and Aide.  [facility] has delivered portable 02 to the room.  Asked agency to visit patient within 24 hours of discharge and agency will be able to meet this request.

## 2015-03-20 NOTE — Progress Notes (Signed)
Patient has a 2.66 sec. Pause DR Juliann Pares and DR Allena Katz was made aware no new order , will continue to monitor

## 2015-03-20 NOTE — Plan of Care (Signed)
Problem: Problem: Respiratory Progression Goal: ABLE TO WEAN TO ROOM AIR Outcome: Not Met (add Reason) Desats with activity on room air Goal: ABLE TO AMBULATE ON ROOM AIR Outcome: Not Met (add Reason) Desats with activity on room air

## 2015-03-20 NOTE — Progress Notes (Addendum)
Patient continue to have pauses last one 3.31 sec DR Allena Katz was informed and stated he will discontinue metoprolol tab and pt's can still go home, will continue to monitor

## 2015-03-20 NOTE — Care Management Important Message (Signed)
Important Message  Patient Details  Name: Allison Shepard MRN: [id number] Date of Birth: Oct 12, 1933   Medicare Important Message Given:  Yes-third notification given    [name] 03/20/2015, 9:50 AM

## 2015-03-20 NOTE — Progress Notes (Signed)
Patient is discharge in a fair condition, in no acute distress at time of discharge, summary and f/u care given to both pt and husband , verbalized understanding , left with husband

## 2015-03-20 NOTE — Discharge Summary (Signed)
Allison Shepard [place], 81 y.o., DOB 11/29/1933, MRN [id number]. Admission date: 03/16/2015 Discharge Date 03/20/2015 Primary MD Mickey Farber, MD Admitting Physician Ramonita Lab, MD  Admission Diagnosis  COPD exacerbation [J44.1]  Discharge Diagnosis    Acute respiratory failure Acute on chronic COPD exasperation Suspected CHF unclear type echo currently pending Sinus pauses patient's metoprolol discontinued asymptomatic Diabetes Chronic atrial fibrillation Essential hypertension Chronic renal failure         Hospital Course Allison Shepard is a 81 y.o. female with a known history of COPD and chronic shortness of breath is brought into the ED with worsening of shortness of breath and cough. Patient was noted to have acute on chronic COPD as well as possible congestive heart failure. She was admitted initially and placed on BiPAP. She was treated with nebulizers steroids anabiotic's. She was slow to improve. Currently she is on 2 L of oxygen. She is very anxious to go home. She also was treated with iV Lasix for possible congestive heart failure she had a recent echocardiogram of the heart done in [place] by her cardiologist however this has not been read and we asked him to read this. She was seen by Dr. Juliann Pares of cardiology who concurred with the current plan. Patient was recommended to go to skilled nursing facility but she refused. She also desats without oxygen I strongly recommended she go home on oxygen. She is agreeable to that. She is also arranged for home health PT. [facility] outpatient Follow-up with her cardiologist as well            Consults  cardiology  Significant Tests:  See full reports for all details    Dg Chest 1 View  03/16/2015   CLINICAL DATA:  Subsequent encounter for worsening shortness of Breath after emergency room visit earlier today.  EXAM: CHEST  1 VIEW  COMPARISON:  Earlier the same day  FINDINGS: 1941 hours. Low lung volumes. Assessment a  Lung bases limited by patient body habitus and portable technique. There is mild vascular congestion without overt airspace pulmonary edema. Probable atelectasis in the lung bases. The cardio pericardial silhouette is enlarged. Bones are diffusely demineralized. Telemetry leads overlie the chest.  IMPRESSION: Limited study secondary to body habitus and portable technique.  Low volume film with enlargement of the cardiopericardial silhouette and vascular congestion.   Electronically Signed   By: Kennith Center M.D.   On: 03/16/2015 19:58   Dg Chest 2 View  03/16/2015   CLINICAL DATA:  Progressive shortness of breath for the past month  EXAM: CHEST  2 VIEW  COMPARISON:  February 10, 2014  FINDINGS: Cardiomegaly is stable. Pulmonary vascularity is normal. There is patchy opacity in the left lower lobe. The lungs elsewhere clear. No adenopathy. There is degenerative change in the thoracic spine.  IMPRESSION: Patchy opacity in the left lower lobe, likely atelectasis. Lungs elsewhere clear. Heart enlarged with pulmonary vascularity within normal limits.   Electronically Signed   By: Bretta Bang III M.D.   On: 03/16/2015 08:04       Today   Subjective:   Allison Shepard feels better shortness of breath improved anxious to go home  Objective:   Blood pressure 131/102, pulse 56, temperature 97.9 F (36.6 C), temperature source Oral, resp. rate 18, height 5\' 5"  (1.651 m), weight 113.172 kg (249 lb 8 oz), SpO2 89 %.  .  Intake/Output Summary (Last 24 hours) at 03/20/15 1244 Last data filed at 03/20/15 0830  Gross per 24 hour  Intake    720 ml  Output      0 ml  Net    720 ml    Exam VITAL SIGNS: Blood pressure 131/102, pulse 56, temperature 97.9 F (36.6 C), temperature source Oral, resp. rate 18, height $Remo (1.651 m), weight 113.172 kg (249 lb 8 oz), SpO2 89 %.  GENERAL:  81 y.o.-year-old patient lying in the bed with no acute distress.  EYES: Pupils equal, round, reactive to light and  accommodation. No scleral icterus. Extraocular muscles intact.  HEENT: Head atraumatic, normocephalic. Oropharynx and nasopharynx clear.  NECK:  Supple, no jugular venous distention. No thyroid enlargement, no tenderness.  LUNGS: Normal breath sounds bilaterally, no wheezing, rales,rhonchi or crepitation. No use of accessory muscles of respiration.  CARDIOVASCULAR: S1, S2 normal. No murmurs, rubs, or gallops.  ABDOMEN: Soft, nontender, nondistended. Bowel sounds present. No organomegaly or mass.  EXTREMITIES: No pedal edema, cyanosis, or clubbing.  NEUROLOGIC: Cranial nerves II through XII are intact. Muscle strength 5/5 in all extremities. Sensation intact. Gait not checked.  PSYCHIATRIC: The patient is alert and oriented x 3.  SKIN: No obvious rash, lesion, or ulcer.   Data Review     CBC w Diff: Lab Results  Component Value Date   WBC 9.8 03/19/2015   WBC 6.3 02/11/2014   HGB 13.4 03/19/2015   HGB 14.6 02/11/2014   HCT 38.8 03/19/2015   HCT 44.5 02/11/2014   PLT 177 03/19/2015   PLT 188 02/11/2014   LYMPHOPCT 7 03/16/2015   LYMPHOPCT 18.9 02/11/2014   MONOPCT 7 03/16/2015   MONOPCT 5.9 02/11/2014   EOSPCT 1 03/16/2015   EOSPCT 2.7 02/11/2014   BASOPCT 1 03/16/2015   BASOPCT 0.8 02/11/2014   CMP: Lab Results  Component Value Date   NA 137 03/20/2015   NA 140 02/11/2014   K 5.2* 03/20/2015   K 3.5 02/11/2014   CL 108 03/20/2015   CL 107 02/11/2014   CO2 23 03/20/2015   CO2 26 02/11/2014   BUN 67* 03/20/2015   BUN 35* 02/11/2014   CREATININE 1.45* 03/20/2015   CREATININE 1.23 02/11/2014   PROT 6.7 03/16/2015   PROT 6.4 02/11/2014   ALBUMIN 3.5 03/16/2015   ALBUMIN 2.9* 02/11/2014   BILITOT 0.6 03/16/2015   BILITOT 0.9 02/11/2014   ALKPHOS 72 03/16/2015   ALKPHOS 74 02/11/2014   AST 92* 03/16/2015   AST 55* 02/11/2014   ALT 106* 03/16/2015   ALT 94* 02/11/2014  .  Micro Results Recent Results (from the past 240 hour(s))  MRSA PCR Screening     Status:  None   Collection Time: 03/16/15 11:46 PM  Result Value Ref Range Status   MRSA by PCR NEGATIVE NEGATIVE Final    Comment:        The GeneXpert MRSA Assay (FDA approved for NASAL specimens only), is one component of a comprehensive MRSA colonization surveillance program. It is not intended to diagnose MRSA infection nor to guide or monitor treatment for MRSA infections.         Code Status Orders        Start     Ordered   03/16/15 2341  Full code   Continuous     03/16/15 2341          Follow-up Information    Follow up with THIES, DAVID, MD In 7 days.   Specialty:  Internal Medicine   Why:  Friday, August 12th at 11am, [employer] information:  [address] [place] [address] [phone]       Follow up with Dalia Heading., MD In 7 days.   Specialty:  Cardiology   Why:  Tuesday, August 16th at 915am, [facility]   Contact information:   [address] [place] [address] [phone]       Follow up with Lamont Dowdy, MD In 3 weeks.   Specialty:  Internal Medicine   Why:  Tuesday, August 23rd at 220pm, [facility]   Contact information:   [address] [place] [address] [phone]       Discharge Medications     Medication List    STOP taking these medications        metoprolol succinate 100 MG 24 hr tablet  Commonly known as:  TOPROL-XL      TAKE these medications        AIRBORNE PO  Take 1 tablet by mouth daily.     albuterol (2.5 MG/3ML) 0.083% nebulizer solution  Commonly known as:  PROVENTIL  Take 2.5 mg by nebulization every 6 (six) hours as needed for wheezing or shortness of breath.     albuterol 108 (90 BASE) MCG/ACT inhaler  Commonly known as:  PROVENTIL HFA;VENTOLIN HFA  Inhale 2 puffs into the lungs every 6 (six) hours as needed for wheezing or shortness of breath.     ANORO ELLIPTA 62.5-25 MCG/INH Aepb  Generic drug:  Umeclidinium-Vilanterol  Inhale 1 puff into the lungs daily.      diltiazem 180 MG 24 hr capsule  Commonly known as:  TIAZAC  Take 180 mg by mouth daily.     fluticasone 50 MCG/ACT nasal spray  Commonly known as:  FLONASE  Place 1 spray into both nostrils daily.     furosemide 40 MG tablet  Commonly known as:  LASIX  Take 40 mg by mouth daily.     guaiFENesin 600 MG 12 hr tablet  Commonly known as:  MUCINEX  Take 1 tablet (600 mg total) by mouth 2 (two) times daily.     insulin aspart 100 UNIT/ML injection  Commonly known as:  novoLOG  Inject 25 Units into the skin 3 (three) times daily.     insulin glargine 100 UNIT/ML injection  Commonly known as:  LANTUS  Inject 25 Units into the skin 2 (two) times daily.     levofloxacin 250 MG tablet  Commonly known as:  LEVAQUIN  Take 1 tablet (250 mg total) by mouth daily.     lisinopril 5 MG tablet  Commonly known as:  PRINIVIL,ZESTRIL  Take 5 mg by mouth daily.     meloxicam 7.5 MG tablet  Commonly known as:  MOBIC  Take 1 tablet (7.5 mg total) by mouth 2 (two) times daily after a meal.     predniSONE 10 MG (21) Tbpk tablet  Commonly known as:  STERAPRED UNI-PAK 21 TAB  Take 1 tablet (10 mg total) by mouth daily. Label  & dispense according to the schedule below.  6 tablets day one, then 5 table day 2, then 4 tablets day 3, then 3 tablets day 4, 2 tablets day 5, then 1 tablet day 6, then stop     rivaroxaban 20 MG Tabs tablet  Commonly known as:  XARELTO  Take 20 mg by mouth daily.     simvastatin 10 MG tablet  Commonly known as:  ZOCOR  Take 10 mg by mouth daily.     Vitamin D-3 1000  UNITS Caps  Take 1,000 Units by mouth daily.           Total Time in preparing paper work, data evaluation and todays exam - 35 minutes  Auburn Bilberry M.D on 03/20/2015 at 12:44 PM  [facility]   Office  [phone]

## 2015-03-20 NOTE — Progress Notes (Signed)
ANTIBIOTIC CONSULT NOTE - FOLLOW UP   Pharmacy Consult for Levaquin Indication: AECOPD  Allergies  Allergen Reactions  . Hydromorphone Hcl Nausea And Vomiting  . Ibuprofen Other (See Comments)    Pt was told by her MD not to take this medication.    Marland Kitchen Percocet [Oxycodone-Acetaminophen] Hives    Patient Measurements: Height:  (165.1 cm) Weight: 249 lb 8 oz (113.172 kg) IBW/kg (Calculated) : 57   Vital Signs: Temp: 97.9 F (36.6 C) (08/05 0757) Temp Source: Oral (08/05 0757) BP: 129/75 mmHg (08/05 0757) Pulse Rate: 73 (08/05 0757) Intake/Output from previous day: 08/04 0701 - 08/05 0700 In: 720 [P.O.:720] Out: 200 [Urine:200] Intake/Output from this shift:    Labs:  Recent Labs  03/18/15 0443 03/19/15 0447 03/20/15 0351  WBC 10.4 9.8  --   HGB 13.3 13.4  --   PLT 171 177  --   CREATININE 1.76* 1.63* 1.45*   Estimated Creatinine Clearance: 40.1 mL/min (by C-G formula based on Cr of 1.45). No results for input(s): VANCOTROUGH, VANCOPEAK, VANCORANDOM, GENTTROUGH, GENTPEAK, GENTRANDOM, TOBRATROUGH, TOBRAPEAK, TOBRARND, AMIKACINPEAK, AMIKACINTROU, AMIKACIN in the last 72 hours.   Microbiology: Recent Results (from the past 720 hour(s))  MRSA PCR Screening     Status: None   Collection Time: 03/16/15 11:46 PM  Result Value Ref Range Status   MRSA by PCR NEGATIVE NEGATIVE Final    Comment:        The GeneXpert MRSA Assay (FDA approved for NASAL specimens only), is one component of a comprehensive MRSA colonization surveillance program. It is not intended to diagnose MRSA infection nor to guide or monitor treatment for MRSA infections.     Medical History: Past Medical History  Diagnosis Date  . COPD (chronic obstructive pulmonary disease)   . Diabetes mellitus without complication   . Atrial fibrillation   . Hypertension   . Hypercholesteremia     Medications:  Scheduled:  . acetylcysteine  4 mL Nebulization BID  . antiseptic oral rinse  7 mL  Mouth Rinse BID  . diltiazem  180 mg Oral Daily  . docusate sodium  200 mg Oral BID  . fluticasone  1 spray Each Nare Daily  . guaiFENesin  600 mg Oral BID  . insulin aspart  0-9 Units Subcutaneous TID WC  . insulin aspart  25 Units Subcutaneous TID WC  . insulin glargine  25 Units Subcutaneous BID  . ipratropium-albuterol  3 mL Nebulization Q6H  . levofloxacin  250 mg Oral Daily  . lisinopril  5 mg Oral Daily  . meloxicam  7.5 mg Oral BID PC  . methylPREDNISolone (SOLU-MEDROL) injection  60 mg Intravenous Q24H  . metoprolol succinate  100 mg Oral Daily  . rivaroxaban  20 mg Oral Daily  . senna  1 tablet Oral Daily  . simvastatin  10 mg Oral Daily  . sodium chloride  3 mL Intravenous Q12H   Infusions:   PRN: sodium chloride, acetaminophen **OR** acetaminophen, albuterol, diphenhydrAMINE, ondansetron **OR** ondansetron (ZOFRAN) IV, senna-docusate, sodium chloride, traZODone  Assessment: 81 y/o F admitted with AECOPD.   Plan:  Patient qualifies for IV to PO transition. Will change to PO levofloxacin 250 mg PO q24 hours. Will f/u culture results and renal function.   Shasha Buchbinder D 03/20/2015,10:11 AM

## 2015-03-20 NOTE — Consult Note (Signed)
Reason for Consult:Shortness of breath ,congestion ,atrial fibrillation Referring Physician:  [facility] hospitalist Cardiologists Dr. Einar Pheasant Allison Shepard is an 81 y.o. female.  HPI:  81 year old white female with a history of COPD atrial fibrillation diabetes complains of cough congestion dyspnea shortness of breath so she came to the emergency room for evaluation was found have rapid atrial fibrillation as well as COPD  Exacerbation. Patient has a previous history of smoking but quit several years ago. Patient was treated emergency room with inhalers steroids and seems without improvement she has been using the oxygen denies any chest pain no recent palpitations no leg edema feels much better today than when she presented. Patient denies fever chills or sweats no significant sputum production.  Past Medical History  Diagnosis Date  . COPD (chronic obstructive pulmonary disease)   . Diabetes mellitus without complication   . Atrial fibrillation   . Hypertension   . Hypercholesteremia     Past Surgical History  Procedure Laterality Date  . Abdominal hysterectomy    . Shoulder fusion surgery Right   . Knee surgery Right   . Hip surgery Right   . Back surgery      No family history on file.  Social History:  reports that she has never smoked. She does not have any smokeless tobacco history on file. She reports that she drinks about 0.6 oz of alcohol per week. She reports that she does not use illicit drugs.  Allergies:  Allergies  Allergen Reactions  . Hydromorphone Hcl Nausea And Vomiting  . Ibuprofen Other (See Comments)    Pt was told by her MD not to take this medication.    Marland Kitchen Percocet [Oxycodone-Acetaminophen] Hives    Medications: I have reviewed the patient's current medications.  Results for orders placed or performed during the hospital encounter of 03/16/15 (from the past 48 hour(s))  Glucose, capillary     Status: Abnormal   Collection Time: 03/18/15 11:42 AM   Result Value Ref Range   Glucose-Capillary 189 (H) 65 - 99 mg/dL   Comment 1 Notify RN   Glucose, capillary     Status: Abnormal   Collection Time: 03/18/15  5:01 PM  Result Value Ref Range   Glucose-Capillary 118 (H) 65 - 99 mg/dL   Comment 1 Notify RN   Glucose, capillary     Status: Abnormal   Collection Time: 03/18/15  8:49 PM  Result Value Ref Range   Glucose-Capillary 206 (H) 65 - 99 mg/dL  Basic metabolic panel     Status: Abnormal   Collection Time: 03/19/15  4:47 AM  Result Value Ref Range   Sodium 135 135 - 145 mmol/L   Potassium 5.0 3.5 - 5.1 mmol/L   Chloride 106 101 - 111 mmol/L   CO2 24 22 - 32 mmol/L   Glucose, Bld 138 (H) 65 - 99 mg/dL   BUN 70 (H) 6 - 20 mg/dL   Creatinine, Ser 1.63 (H) 0.44 - 1.00 mg/dL   Calcium 8.4 (L) 8.9 - 10.3 mg/dL   GFR calc non Af Amer 29 (L) >60 mL/min   GFR calc Af Amer 34 (L) >60 mL/min    Comment: (NOTE) The eGFR has been calculated using the CKD EPI equation. This calculation has not been validated in all clinical situations. eGFR's persistently <60 mL/min signify possible Chronic Kidney Disease.    Anion gap 5 5 - 15  CBC     Status: Abnormal   Collection Time: 03/19/15  4:47  AM  Result Value Ref Range   WBC 9.8 3.6 - 11.0 K/uL   RBC 3.85 3.80 - 5.20 MIL/uL   Hemoglobin 13.4 12.0 - 16.0 g/dL   HCT 38.8 35.0 - 47.0 %   MCV 100.7 (H) 80.0 - 100.0 fL   MCH 34.9 (H) 26.0 - 34.0 pg   MCHC 34.6 32.0 - 36.0 g/dL   RDW 14.9 (H) 11.5 - 14.5 %   Platelets 177 150 - 440 K/uL  Glucose, capillary     Status: Abnormal   Collection Time: 03/19/15  7:31 AM  Result Value Ref Range   Glucose-Capillary 133 (H) 65 - 99 mg/dL   Comment 1 Notify RN   Glucose, capillary     Status: Abnormal   Collection Time: 03/19/15 11:47 AM  Result Value Ref Range   Glucose-Capillary 160 (H) 65 - 99 mg/dL   Comment 1 Notify RN   Glucose, capillary     Status: Abnormal   Collection Time: 03/19/15  4:40 PM  Result Value Ref Range    Glucose-Capillary 190 (H) 65 - 99 mg/dL   Comment 1 Notify RN   Glucose, capillary     Status: Abnormal   Collection Time: 03/19/15  7:55 PM  Result Value Ref Range   Glucose-Capillary 209 (H) 65 - 99 mg/dL  Basic metabolic panel     Status: Abnormal   Collection Time: 03/20/15  3:51 AM  Result Value Ref Range   Sodium 137 135 - 145 mmol/L   Potassium 5.2 (H) 3.5 - 5.1 mmol/L   Chloride 108 101 - 111 mmol/L   CO2 23 22 - 32 mmol/L   Glucose, Bld 159 (H) 65 - 99 mg/dL   BUN 67 (H) 6 - 20 mg/dL   Creatinine, Ser 1.45 (H) 0.44 - 1.00 mg/dL   Calcium 8.5 (L) 8.9 - 10.3 mg/dL   GFR calc non Af Amer 34 (L) >60 mL/min   GFR calc Af Amer 39 (L) >60 mL/min    Comment: (NOTE) The eGFR has been calculated using the CKD EPI equation. This calculation has not been validated in all clinical situations. eGFR's persistently <60 mL/min signify possible Chronic Kidney Disease.    Anion gap 6 5 - 15  Glucose, capillary     Status: Abnormal   Collection Time: 03/20/15  7:31 AM  Result Value Ref Range   Glucose-Capillary 125 (H) 65 - 99 mg/dL   Comment 1 Notify RN     No results found.  Review of Systems  HENT: Positive for congestion.   Eyes: Negative.   Respiratory: Positive for cough, shortness of breath and wheezing.   Cardiovascular: Positive for orthopnea.  Gastrointestinal: Negative.   Genitourinary: Negative.   Musculoskeletal: Negative.   Skin: Negative.   Neurological: Positive for weakness.  Endo/Heme/Allergies: Negative.   Psychiatric/Behavioral: Negative.    Blood pressure 129/75, pulse 73, temperature 97.9 F (36.6 C), temperature source Oral, resp. rate 20, height _0  (1.651 m), weight 113.172 kg (249 lb 8 oz), SpO2 97 %. Physical Exam  Nursing note and vitals reviewed. Constitutional: She appears well-developed and well-nourished.  HENT:  Head: Normocephalic and atraumatic.  Right Ear: External ear normal.  Eyes: Conjunctivae and EOM are normal. Pupils are equal,  round, and reactive to light.  Neck: Normal range of motion. Neck supple.  Cardiovascular: S1 normal, S2 normal, normal heart sounds and intact distal pulses.  An irregularly irregular rhythm present.    Assessment/Plan:  COPD exacerbation  shortness of  breath  congestion  atrial fibrillation  hypertension  diabetes type 2  hyperlipidemia  obesity  chronic lower back pain with DJD . PLAN  agree with telemetry for atrial fibrillation monitoring  inhalers as necessary for COPD  agree with antibiotic therapy  agree with steroid taper  continue diabetes management  maintain diltiazem metoprolol for rate control for AFIB  continue Xarelto as anticoagulation for atrial fibrillation  recommend weight loss exercise portion control  agree with simvastatin therapy for lipid management  have the patient follow-up with pulmonary as an outpatient for COPD monitoring and therapy  follow-up with pain clinic for chronic pain management  have the patient follow-up with Cardiology as an outpatient 1-2 weeks  the patient appears to be stable enough for discharge as an outpatient  CALLWOOD,DWAYNE D. 03/20/2015, 8:59 AM

## 2015-03-20 NOTE — Clinical Social Work Note (Signed)
CSW spoke to pt.  She was sitting up in her chair alert and Ox4.  She is refusing SNF placement at this time.  SHe staeted that she would prefer to go home with her husband.  CSW signing off unless further needs arise.

## 2015-03-25 ENCOUNTER — Ambulatory Visit: Payer: Medicare Other | Attending: Anesthesiology | Admitting: Anesthesiology

## 2015-03-25 ENCOUNTER — Encounter: Payer: Self-pay | Admitting: Anesthesiology

## 2015-03-25 ENCOUNTER — Ambulatory Visit: Payer: Medicare Other | Admitting: Anesthesiology

## 2015-03-25 VITALS — BP 127/54 | HR 113 | Temp 98.2°F | Resp 22 | Ht 65.0 in | Wt 200.0 lb

## 2015-03-25 DIAGNOSIS — M1612 Unilateral primary osteoarthritis, left hip: Secondary | ICD-10-CM

## 2015-03-25 DIAGNOSIS — M1611 Unilateral primary osteoarthritis, right hip: Secondary | ICD-10-CM

## 2015-03-25 DIAGNOSIS — M25551 Pain in right hip: Secondary | ICD-10-CM | POA: Diagnosis not present

## 2015-03-25 DIAGNOSIS — Z9889 Other specified postprocedural states: Secondary | ICD-10-CM | POA: Insufficient documentation

## 2015-03-25 DIAGNOSIS — M549 Dorsalgia, unspecified: Secondary | ICD-10-CM | POA: Diagnosis present

## 2015-03-25 DIAGNOSIS — M25552 Pain in left hip: Secondary | ICD-10-CM | POA: Diagnosis not present

## 2015-03-25 DIAGNOSIS — I517 Cardiomegaly: Secondary | ICD-10-CM | POA: Insufficient documentation

## 2015-03-25 DIAGNOSIS — I4891 Unspecified atrial fibrillation: Secondary | ICD-10-CM | POA: Insufficient documentation

## 2015-03-25 DIAGNOSIS — M7072 Other bursitis of hip, left hip: Secondary | ICD-10-CM

## 2015-03-25 DIAGNOSIS — I482 Chronic atrial fibrillation, unspecified: Secondary | ICD-10-CM

## 2015-03-25 DIAGNOSIS — E669 Obesity, unspecified: Secondary | ICD-10-CM

## 2015-03-25 DIAGNOSIS — G8929 Other chronic pain: Secondary | ICD-10-CM | POA: Insufficient documentation

## 2015-03-25 DIAGNOSIS — M7071 Other bursitis of hip, right hip: Secondary | ICD-10-CM

## 2015-03-25 NOTE — Progress Notes (Signed)
Safety precautions to be maintained throughout the outpatient stay will include: orient to surroundings, keep bed in low position, maintain call bell within reach at all times, provide assistance with transfer out of bed and ambulation.  

## 2015-03-25 NOTE — Progress Notes (Signed)
   Subjective:    Patient ID: Allison Shepard, female    DOB: [birth year], 81 y.o.   MRN: [id number] This 81 year old lady has returned to the clinic today following a caudal epidural steroid injection which she had last week. She indicates that her back pain is completely resolved and that her subjective pain intensity rating is 0% She indicates she is able to ambulate without difficulty and is feeling wonderful She was seen in the emergency room a few days ago for her cardiac problems and her atrial fibrillation. HPI    Review of Systems  Constitutional: Negative.   HENT: Negative.   Eyes: Negative.   Respiratory: Negative.   Cardiovascular: Negative for chest pain, palpitations and leg swelling.       Her blood pressure was 127/54 Her pulse was 113 beats per minutes and was a regular She has a history of atrial fibrillation Respirations were 22 breaths per minute SPO2 was 94% She was not in heart failure and had no other cardiorespiratory problems  Gastrointestinal: Negative.   Endocrine: Negative.   Genitourinary: Negative.   Allergic/Immunologic: Negative.   Neurological: Negative.   Hematological: Negative.   Psychiatric/Behavioral: Negative.        Objective:   Physical Exam  Constitutional: She is oriented to person, place, and time. No distress.  HENT:  Head: Normocephalic and atraumatic.  Right Ear: External ear normal.  Left Ear: External ear normal.  Nose: Nose normal.  Mouth/Throat: Oropharynx is clear and moist. No oropharyngeal exudate.  Eyes: Conjunctivae and EOM are normal. Pupils are equal, round, and reactive to light. Right eye exhibits no discharge. Left eye exhibits no discharge. No scleral icterus.  Neck: Normal range of motion. Neck supple. No JVD present. No tracheal deviation present. No thyromegaly present.  Cardiovascular: Normal rate, regular rhythm, normal heart sounds and intact distal pulses.   Pulmonary/Chest: Effort normal. No  respiratory distress. She has no wheezes. She has no rales. She exhibits no tenderness.  Genitourinary:  Genitourinary examination deferred  Musculoskeletal: Normal range of motion. She exhibits no edema or tenderness.  Lymphadenopathy:    She has no cervical adenopathy.  Neurological: She is alert and oriented to person, place, and time. She has normal reflexes. She displays normal reflexes. No cranial nerve deficit. She exhibits normal muscle tone. Coordination normal.  Skin: Skin is warm and dry. No rash noted. She is not diaphoretic. No erythema. No pallor.  Psychiatric: She has a normal mood and affect. Her behavior is normal. Judgment and thought content normal.  Nursing note reviewed.  In summary, this patient has done remarkably well after her caudal epidural steroid injection and has absolutely no pain. There are no new neurological or musculoskeletal issues to address at this time       Assessment & Plan:    1 Chronic bilateral hip pain 2 status post majors spinal surgery with hardware implantation 3 bursitis of both 4 mild obesity 5 atrial fibrillation with cardiomegaly  Plan of management: This patient is doing very well and in fact she has no pain. She indicates that her hip pain in her back pain have completely resolved and that she feels fine. There is no need for any intervention at this time I will plan to follow up with her in the next 3 months   Established patient: level 2   Tod Persia M.D.

## 2015-04-06 ENCOUNTER — Emergency Department: Payer: Medicare Other

## 2015-04-06 ENCOUNTER — Inpatient Hospital Stay
Admission: EM | Admit: 2015-04-06 | Discharge: 2015-04-14 | DRG: 871 | Disposition: A | Discharge status: Skilled Nursing Facility | Payer: Medicare Other | Attending: Internal Medicine | Admitting: Internal Medicine

## 2015-04-06 ENCOUNTER — Encounter: Payer: Self-pay | Admitting: Emergency Medicine

## 2015-04-06 DIAGNOSIS — Z7951 Long term (current) use of inhaled steroids: Secondary | ICD-10-CM | POA: Diagnosis not present

## 2015-04-06 DIAGNOSIS — Z7901 Long term (current) use of anticoagulants: Secondary | ICD-10-CM | POA: Diagnosis not present

## 2015-04-06 DIAGNOSIS — E780 Pure hypercholesterolemia: Secondary | ICD-10-CM | POA: Diagnosis present

## 2015-04-06 DIAGNOSIS — Z79899 Other long term (current) drug therapy: Secondary | ICD-10-CM | POA: Diagnosis not present

## 2015-04-06 DIAGNOSIS — Z9889 Other specified postprocedural states: Secondary | ICD-10-CM | POA: Diagnosis not present

## 2015-04-06 DIAGNOSIS — A408 Other streptococcal sepsis: Principal | ICD-10-CM | POA: Diagnosis present

## 2015-04-06 DIAGNOSIS — Z6841 Body Mass Index (BMI) 40.0 and over, adult: Secondary | ICD-10-CM | POA: Diagnosis not present

## 2015-04-06 DIAGNOSIS — F419 Anxiety disorder, unspecified: Secondary | ICD-10-CM | POA: Diagnosis present

## 2015-04-06 DIAGNOSIS — F0390 Unspecified dementia without behavioral disturbance: Secondary | ICD-10-CM | POA: Diagnosis present

## 2015-04-06 DIAGNOSIS — I4891 Unspecified atrial fibrillation: Secondary | ICD-10-CM

## 2015-04-06 DIAGNOSIS — Z885 Allergy status to narcotic agent status: Secondary | ICD-10-CM | POA: Diagnosis not present

## 2015-04-06 DIAGNOSIS — I481 Persistent atrial fibrillation: Secondary | ICD-10-CM | POA: Diagnosis present

## 2015-04-06 DIAGNOSIS — R5381 Other malaise: Secondary | ICD-10-CM | POA: Diagnosis not present

## 2015-04-06 DIAGNOSIS — R6521 Severe sepsis with septic shock: Secondary | ICD-10-CM | POA: Diagnosis present

## 2015-04-06 DIAGNOSIS — R0603 Acute respiratory distress: Secondary | ICD-10-CM | POA: Diagnosis present

## 2015-04-06 DIAGNOSIS — E119 Type 2 diabetes mellitus without complications: Secondary | ICD-10-CM | POA: Diagnosis present

## 2015-04-06 DIAGNOSIS — G4700 Insomnia, unspecified: Secondary | ICD-10-CM | POA: Diagnosis present

## 2015-04-06 DIAGNOSIS — Z888 Allergy status to other drugs, medicaments and biological substances status: Secondary | ICD-10-CM

## 2015-04-06 DIAGNOSIS — K449 Diaphragmatic hernia without obstruction or gangrene: Secondary | ICD-10-CM | POA: Diagnosis present

## 2015-04-06 DIAGNOSIS — A419 Sepsis, unspecified organism: Secondary | ICD-10-CM | POA: Diagnosis present

## 2015-04-06 DIAGNOSIS — Z794 Long term (current) use of insulin: Secondary | ICD-10-CM

## 2015-04-06 DIAGNOSIS — J189 Pneumonia, unspecified organism: Secondary | ICD-10-CM | POA: Diagnosis present

## 2015-04-06 DIAGNOSIS — J438 Other emphysema: Secondary | ICD-10-CM | POA: Diagnosis not present

## 2015-04-06 DIAGNOSIS — Z823 Family history of stroke: Secondary | ICD-10-CM | POA: Diagnosis not present

## 2015-04-06 DIAGNOSIS — J441 Chronic obstructive pulmonary disease with (acute) exacerbation: Secondary | ICD-10-CM | POA: Diagnosis present

## 2015-04-06 DIAGNOSIS — J80 Acute respiratory distress syndrome: Secondary | ICD-10-CM | POA: Diagnosis present

## 2015-04-06 DIAGNOSIS — Z8249 Family history of ischemic heart disease and other diseases of the circulatory system: Secondary | ICD-10-CM | POA: Diagnosis not present

## 2015-04-06 DIAGNOSIS — Z90710 Acquired absence of both cervix and uterus: Secondary | ICD-10-CM | POA: Diagnosis not present

## 2015-04-06 DIAGNOSIS — L03116 Cellulitis of left lower limb: Secondary | ICD-10-CM | POA: Diagnosis present

## 2015-04-06 DIAGNOSIS — I10 Essential (primary) hypertension: Secondary | ICD-10-CM | POA: Diagnosis present

## 2015-04-06 DIAGNOSIS — R609 Edema, unspecified: Secondary | ICD-10-CM

## 2015-04-06 DIAGNOSIS — I482 Chronic atrial fibrillation: Secondary | ICD-10-CM | POA: Diagnosis present

## 2015-04-06 DIAGNOSIS — E669 Obesity, unspecified: Secondary | ICD-10-CM

## 2015-04-06 DIAGNOSIS — R0902 Hypoxemia: Secondary | ICD-10-CM | POA: Diagnosis present

## 2015-04-06 DIAGNOSIS — J984 Other disorders of lung: Secondary | ICD-10-CM | POA: Diagnosis present

## 2015-04-06 LAB — COMPREHENSIVE METABOLIC PANEL
ALBUMIN: 3.5 g/dL (ref 3.5–5.0)
ALT: 26 U/L (ref 14–54)
AST: 36 U/L (ref 15–41)
Alkaline Phosphatase: 88 U/L (ref 38–126)
Anion gap: 12 (ref 5–15)
BUN: 36 mg/dL — ABNORMAL HIGH (ref 6–20)
CHLORIDE: 105 mmol/L (ref 101–111)
CO2: 22 mmol/L (ref 22–32)
Calcium: 9 mg/dL (ref 8.9–10.3)
Creatinine, Ser: 1.45 mg/dL — ABNORMAL HIGH (ref 0.44–1.00)
GFR calc Af Amer: 39 mL/min — ABNORMAL LOW (ref >60)
GFR, EST NON AFRICAN AMERICAN: 34 mL/min — AB (ref >60)
GLUCOSE: 114 mg/dL — AB (ref 65–99)
POTASSIUM: 5 mmol/L (ref 3.5–5.1)
Sodium: 139 mmol/L (ref 135–145)
Total Bilirubin: 0.9 mg/dL (ref 0.3–1.2)
Total Protein: 6.9 g/dL (ref 6.5–8.1)

## 2015-04-06 LAB — URINALYSIS COMPLETE WITH MICROSCOPIC (ARMC ONLY)
BILIRUBIN URINE: NEGATIVE
Glucose, UA: NEGATIVE mg/dL
HGB URINE DIPSTICK: NEGATIVE
Ketones, ur: NEGATIVE mg/dL
LEUKOCYTES UA: NEGATIVE
Nitrite: NEGATIVE
PH: 5 (ref 5.0–8.0)
PROTEIN: 100 mg/dL — AB
SQUAMOUS EPITHELIAL / LPF: NONE SEEN
Specific Gravity, Urine: 1.02 (ref 1.005–1.030)

## 2015-04-06 LAB — GLUCOSE, CAPILLARY
GLUCOSE-CAPILLARY: 122 mg/dL — AB (ref 65–99)
GLUCOSE-CAPILLARY: 95 mg/dL (ref 65–99)
Glucose-Capillary: 108 mg/dL — ABNORMAL HIGH (ref 65–99)

## 2015-04-06 LAB — CBC WITH DIFFERENTIAL/PLATELET
BASOS ABS: 0 10*3/uL (ref 0–0.1)
Basophils Relative: 0 %
EOS ABS: 0.1 10*3/uL (ref 0–0.7)
EOS PCT: 1 %
HCT: 42.3 % (ref 35.0–47.0)
Hemoglobin: 14.1 g/dL (ref 12.0–16.0)
LYMPHS ABS: 0.3 10*3/uL — AB (ref 1.0–3.6)
Lymphocytes Relative: 2 %
MCH: 33.7 pg (ref 26.0–34.0)
MCHC: 33.4 g/dL (ref 32.0–36.0)
MCV: 100.9 fL — ABNORMAL HIGH (ref 80.0–100.0)
MONO ABS: 0.2 10*3/uL (ref 0.2–0.9)
Monocytes Relative: 2 %
Neutro Abs: 11.5 10*3/uL — ABNORMAL HIGH (ref 1.4–6.5)
Neutrophils Relative %: 95 %
PLATELETS: 222 10*3/uL (ref 150–440)
RBC: 4.19 MIL/uL (ref 3.80–5.20)
RDW: 15.1 % — AB (ref 11.5–14.5)
WBC: 12.1 10*3/uL — AB (ref 3.6–11.0)

## 2015-04-06 LAB — LACTIC ACID, PLASMA
LACTIC ACID, VENOUS: 3 mmol/L — AB (ref 0.5–2.0)
LACTIC ACID, VENOUS: 2.3 mmol/L — AB (ref 0.5–2.0)
Lactic Acid, Venous: 2.9 mmol/L (ref 0.5–2.0)

## 2015-04-06 LAB — MRSA PCR SCREENING: MRSA by PCR: NEGATIVE

## 2015-04-06 LAB — EXPECTORATED SPUTUM ASSESSMENT W GRAM STAIN, RFLX TO RESP C

## 2015-04-06 LAB — C DIFFICILE QUICK SCREEN W PCR REFLEX
C Diff antigen: NEGATIVE
C Diff interpretation: NEGATIVE
C Diff toxin: NEGATIVE

## 2015-04-06 LAB — PROTIME-INR
INR: 1.64
Prothrombin Time: 19.6 seconds — ABNORMAL HIGH (ref 11.4–15.0)

## 2015-04-06 LAB — EXPECTORATED SPUTUM ASSESSMENT W REFEX TO RESP CULTURE

## 2015-04-06 LAB — LIPASE, BLOOD: LIPASE: 49 U/L (ref 22–51)

## 2015-04-06 LAB — TROPONIN I: Troponin I: <0.03 ng/mL (ref <0.031)

## 2015-04-06 LAB — TSH: TSH: 3.762 u[IU]/mL (ref 0.350–4.500)

## 2015-04-06 LAB — APTT: APTT: 28 s (ref 24–36)

## 2015-04-06 MED ORDER — ALBUTEROL SULFATE (2.5 MG/3ML) 0.083% IN NEBU
2.5000 mg | INHALATION_SOLUTION | Freq: Four times a day (QID) | RESPIRATORY_TRACT | Status: DC | PRN
  Administered 2015-04-10: 2.5 mg via RESPIRATORY_TRACT
  Filled 2015-04-06 (×2): qty 3

## 2015-04-06 MED ORDER — DEXTROSE 5 % IV SOLN: 0.0000 ug/min | INTRAVENOUS | Status: DC

## 2015-04-06 MED ORDER — AZITHROMYCIN 250 MG PO TABS
500.0000 mg | ORAL_TABLET | Freq: Every day | ORAL | Status: AC
  Administered 2015-04-07: 500 mg via ORAL
  Filled 2015-04-06: qty 2

## 2015-04-06 MED ORDER — ENOXAPARIN SODIUM 40 MG/0.4ML ~~LOC~~ SOLN: 40.0000 mg | SUBCUTANEOUS | Status: DC

## 2015-04-06 MED ORDER — MELOXICAM 7.5 MG PO TABS
7.5000 mg | ORAL_TABLET | Freq: Two times a day (BID) | ORAL | Status: DC
  Filled 2015-04-06 (×7): qty 1

## 2015-04-06 MED ORDER — PIPERACILLIN-TAZOBACTAM 3.375 G IVPB 30 MIN
3.3750 g | Freq: Once | INTRAVENOUS | Status: AC
  Administered 2015-04-06: 3.375 g via INTRAVENOUS
  Filled 2015-04-06: qty 50

## 2015-04-06 MED ORDER — DEXTROSE 5 % IV SOLN
5.0000 mg/h | INTRAVENOUS | Status: DC
  Administered 2015-04-06 – 2015-04-07 (×2): 10 mg/h via INTRAVENOUS
  Filled 2015-04-06 (×2): qty 100

## 2015-04-06 MED ORDER — UMECLIDINIUM-VILANTEROL 62.5-25 MCG/INH IN AEPB: 1.0000 | INHALATION_SPRAY | Freq: Every day | RESPIRATORY_TRACT | Status: DC

## 2015-04-06 MED ORDER — PIPERACILLIN-TAZOBACTAM 3.375 G IVPB
3.3750 g | Freq: Three times a day (TID) | INTRAVENOUS | Status: DC
  Administered 2015-04-06 – 2015-04-07 (×4): 3.375 g via INTRAVENOUS
  Filled 2015-04-06 (×7): qty 50

## 2015-04-06 MED ORDER — INSULIN ASPART 100 UNIT/ML ~~LOC~~ SOLN
0.0000 [IU] | Freq: Three times a day (TID) | SUBCUTANEOUS | Status: DC
  Administered 2015-04-07: 2 [IU] via SUBCUTANEOUS
  Administered 2015-04-07: 1 [IU] via SUBCUTANEOUS
  Filled 2015-04-06: qty 1
  Filled 2015-04-06: qty 2

## 2015-04-06 MED ORDER — INSULIN ASPART 100 UNIT/ML ~~LOC~~ SOLN: 0.0000 [IU] | Freq: Every day | SUBCUTANEOUS | Status: DC

## 2015-04-06 MED ORDER — AZITHROMYCIN 250 MG PO TABS: 250.0000 mg | ORAL_TABLET | Freq: Every day | ORAL | Status: DC

## 2015-04-06 MED ORDER — GUAIFENESIN ER 600 MG PO TB12
600.0000 mg | ORAL_TABLET | Freq: Two times a day (BID) | ORAL | Status: DC
  Administered 2015-04-06: 600 mg via ORAL
  Filled 2015-04-06: qty 1

## 2015-04-06 MED ORDER — MOMETASONE FURO-FORMOTEROL FUM 100-5 MCG/ACT IN AERO
2.0000 | INHALATION_SPRAY | Freq: Two times a day (BID) | RESPIRATORY_TRACT | Status: DC
  Administered 2015-04-06 – 2015-04-14 (×16): 2 via RESPIRATORY_TRACT
  Filled 2015-04-06 (×3): qty 8.8

## 2015-04-06 MED ORDER — DILTIAZEM HCL 100 MG IV SOLR
5.0000 mg/h | Freq: Once | INTRAVENOUS | Status: AC
  Administered 2015-04-06: 10 mg/h via INTRAVENOUS
  Filled 2015-04-06: qty 100

## 2015-04-06 MED ORDER — DILTIAZEM HCL 25 MG/5ML IV SOLN
15.0000 mg | Freq: Once | INTRAVENOUS | Status: AC
  Administered 2015-04-06: 15 mg via INTRAVENOUS

## 2015-04-06 MED ORDER — ALUM & MAG HYDROXIDE-SIMETH 200-200-20 MG/5ML PO SUSP: 30.0000 mL | Freq: Four times a day (QID) | ORAL | Status: DC | PRN

## 2015-04-06 MED ORDER — VANCOMYCIN HCL 10 G IV SOLR
1250.0000 mg | INTRAVENOUS | Status: DC
  Administered 2015-04-06 – 2015-04-07 (×2): 1250 mg via INTRAVENOUS
  Filled 2015-04-06 (×4): qty 1250

## 2015-04-06 MED ORDER — SODIUM CHLORIDE 0.9 % IV SOLN: INTRAVENOUS | Status: DC

## 2015-04-06 MED ORDER — RIVAROXABAN 15 MG PO TABS
15.0000 mg | ORAL_TABLET | Freq: Every day | ORAL | Status: DC
  Administered 2015-04-06 – 2015-04-07 (×2): 15 mg via ORAL
  Filled 2015-04-06 (×2): qty 1

## 2015-04-06 MED ORDER — SODIUM CHLORIDE 0.9 % IV BOLUS (SEPSIS)
1000.0000 mL | INTRAVENOUS | Status: AC
  Administered 2015-04-06: 1000 mL via INTRAVENOUS

## 2015-04-06 MED ORDER — INSULIN GLARGINE 100 UNIT/ML ~~LOC~~ SOLN
25.0000 [IU] | Freq: Every day | SUBCUTANEOUS | Status: DC
  Administered 2015-04-06 – 2015-04-13 (×7): 25 [IU] via SUBCUTANEOUS
  Filled 2015-04-06 (×9): qty 0.25

## 2015-04-06 MED ORDER — ONDANSETRON HCL 4 MG/2ML IJ SOLN
4.0000 mg | Freq: Four times a day (QID) | INTRAMUSCULAR | Status: DC | PRN
  Administered 2015-04-09: 4 mg via INTRAVENOUS
  Filled 2015-04-06: qty 2

## 2015-04-06 MED ORDER — VANCOMYCIN HCL IN DEXTROSE 1-5 GM/200ML-% IV SOLN: 1000.0000 mg | Freq: Once | INTRAVENOUS | Status: DC

## 2015-04-06 MED ORDER — INSULIN ASPART 100 UNIT/ML ~~LOC~~ SOLN: 25.0000 [IU] | Freq: Three times a day (TID) | SUBCUTANEOUS | Status: DC

## 2015-04-06 MED ORDER — NOREPINEPHRINE 4 MG/250ML-% IV SOLN
0.0000 ug/min | INTRAVENOUS | Status: DC
Start: 2015-04-06 — End: 2015-04-09

## 2015-04-06 MED ORDER — FLUTICASONE PROPIONATE 50 MCG/ACT NA SUSP
1.0000 | Freq: Every day | NASAL | Status: DC
  Administered 2015-04-06 – 2015-04-14 (×9): 1 via NASAL
  Filled 2015-04-06: qty 16

## 2015-04-06 MED ORDER — DILTIAZEM HCL ER BEADS 180 MG PO CP24
180.0000 mg | ORAL_CAPSULE | Freq: Every day | ORAL | Status: DC
  Administered 2015-04-07 – 2015-04-09 (×3): 180 mg via ORAL
  Filled 2015-04-06 (×7): qty 1

## 2015-04-06 MED ORDER — ONDANSETRON HCL 4 MG PO TABS: 4.0000 mg | ORAL_TABLET | Freq: Four times a day (QID) | ORAL | Status: DC | PRN

## 2015-04-06 MED ORDER — PIPERACILLIN-TAZOBACTAM 3.375 G IVPB 30 MIN
3.3750 g | Freq: Once | INTRAVENOUS | Status: DC
  Filled 2015-04-06: qty 50

## 2015-04-06 MED ORDER — ACETAMINOPHEN 650 MG RE SUPP
RECTAL | Status: AC
  Filled 2015-04-06: qty 2

## 2015-04-06 MED ORDER — DIGOXIN 0.25 MG/ML IJ SOLN
0.2500 mg | Freq: Once | INTRAMUSCULAR | Status: AC
  Administered 2015-04-06: 0.25 mg via INTRAVENOUS
  Filled 2015-04-06: qty 2

## 2015-04-06 MED ORDER — VANCOMYCIN HCL IN DEXTROSE 1-5 GM/200ML-% IV SOLN
1000.0000 mg | Freq: Once | INTRAVENOUS | Status: AC
  Administered 2015-04-06: 1000 mg via INTRAVENOUS
  Filled 2015-04-06: qty 200

## 2015-04-06 MED ORDER — SODIUM CHLORIDE 0.9 % IV SOLN
INTRAVENOUS | Status: DC
  Administered 2015-04-06 – 2015-04-11 (×7): via INTRAVENOUS

## 2015-04-06 MED ORDER — ACETAMINOPHEN 650 MG RE SUPP
975.0000 mg | Freq: Once | RECTAL | Status: AC
  Administered 2015-04-06: 975 mg via RECTAL

## 2015-04-06 MED ORDER — DILTIAZEM HCL 25 MG/5ML IV SOLN
10.0000 mg | Freq: Once | INTRAVENOUS | Status: AC
  Administered 2015-04-06: 10 mg via INTRAVENOUS

## 2015-04-06 MED ORDER — SODIUM CHLORIDE 0.9 % IV BOLUS (SEPSIS)
721.0000 mL | Freq: Once | INTRAVENOUS | Status: AC
  Administered 2015-04-06: 721 mL via INTRAVENOUS

## 2015-04-06 MED ORDER — SIMVASTATIN 10 MG PO TABS
10.0000 mg | ORAL_TABLET | Freq: Every day | ORAL | Status: DC
  Administered 2015-04-06 – 2015-04-10 (×5): 10 mg via ORAL
  Filled 2015-04-06 (×5): qty 1

## 2015-04-06 MED ORDER — ALBUTEROL SULFATE HFA 108 (90 BASE) MCG/ACT IN AERS: 2.0000 | INHALATION_SPRAY | Freq: Four times a day (QID) | RESPIRATORY_TRACT | Status: DC | PRN

## 2015-04-06 MED ORDER — SODIUM CHLORIDE 0.9 % IV BOLUS (SEPSIS)
1000.0000 mL | Freq: Once | INTRAVENOUS | Status: AC
  Administered 2015-04-06: 1000 mL via INTRAVENOUS

## 2015-04-06 MED ORDER — TIOTROPIUM BROMIDE MONOHYDRATE 18 MCG IN CAPS
18.0000 ug | ORAL_CAPSULE | Freq: Every day | RESPIRATORY_TRACT | Status: DC
  Administered 2015-04-06 – 2015-04-11 (×6): 18 ug via RESPIRATORY_TRACT
  Filled 2015-04-06 (×2): qty 5

## 2015-04-06 MED ORDER — DILTIAZEM HCL 25 MG/5ML IV SOLN
INTRAVENOUS | Status: AC
  Administered 2015-04-06: 10 mg via INTRAVENOUS
  Filled 2015-04-06: qty 5

## 2015-04-06 MED ORDER — VITAMIN D 1000 UNITS PO TABS
1000.0000 [IU] | ORAL_TABLET | Freq: Every day | ORAL | Status: DC
  Administered 2015-04-06 – 2015-04-14 (×9): 1000 [IU] via ORAL
  Filled 2015-04-06 (×16): qty 1

## 2015-04-06 MED ORDER — INSULIN GLARGINE 100 UNIT/ML ~~LOC~~ SOLN
25.0000 [IU] | Freq: Every day | SUBCUTANEOUS | Status: DC
  Filled 2015-04-06: qty 0.25

## 2015-04-06 NOTE — ED Notes (Signed)
Pt uprite in bed, shivering, only answers with "yes" and "no" responses; denies pain; difficulty following commands, pulling away from nurse with interventions

## 2015-04-06 NOTE — ED Notes (Signed)
Pt appears calmer, answering "yes" and "no" appropriately and following commands when instructed

## 2015-04-06 NOTE — Consult Note (Signed)
[facility]     Reason for Consult:Fevers  Referring Physician: Max Sane Date of Admission:  04/06/2015  Active Problems:   Sepsis  HPI: Allison Shepard is a 81 y.o. female with COPD, DM admitted with fevers, chills, and A fib with RVR. Her history is rather vague as she cannot provide great detail. However upon admit she was found to have a fever to 104 and was started on abx.  She did hav ea recent admit earlier this month for COPD exacerbation. She also had a steroid spine injection at the beginning of August. She has not had exacerbation of her chronic back pain but she does report increasing weakness. She does have a chronic cough. She tells me she is being set up for Home O2.   Past Medical History  Diagnosis Date  . COPD (chronic obstructive pulmonary disease)   . Diabetes mellitus without complication   . Atrial fibrillation   . Hypertension   . Hypercholesteremia    Past Surgical History  Procedure Laterality Date  . Abdominal hysterectomy    . Shoulder fusion surgery Right   . Knee surgery Right   . Hip surgery Right   . Back surgery     Social History  Substance Use Topics  . Smoking status: Never Smoker   . Smokeless tobacco: None  . Alcohol Use: 0.6 oz/week    0 Standard drinks or equivalent, 1 Glasses of wine per week     Comment: daily   No family history on file.  Allergies:  Allergies  Allergen Reactions  . Hydromorphone Hcl Nausea And Vomiting  . Ibuprofen Other (See Comments)    Pt was told by her MD not to take this medication.    . Mucinex [Guaifenesin Er] Other (See Comments)    Red all over. Felt like i was on fire  . Percocet [Oxycodone-Acetaminophen] Hives    Current antibiotics: Antibiotics Given (last 72 hours)    Date/Time Action Medication Dose Rate   04/06/15 1230 Given   piperacillin-tazobactam (ZOSYN) IVPB 3.375 g 3.375 g 12.5 mL/hr   04/06/15 1331 Given   vancomycin (VANCOCIN) 1,250 mg in sodium chloride  0.9 % 250 mL IVPB 1,250 mg 166.7 mL/hr      MEDICATIONS: . [START ON 04/07/2015] diltiazem  180 mg Oral Daily  . fluticasone  1 spray Each Nare Daily  . insulin aspart  0-9 Units Subcutaneous TID WC  . insulin glargine  25 Units Subcutaneous QHS  . meloxicam  7.5 mg Oral BID PC  . mometasone-formoterol  2 puff Inhalation BID  . piperacillin-tazobactam (ZOSYN)  IV  3.375 g Intravenous 3 times per day  . piperacillin-tazobactam  3.375 g Intravenous Once  . Rivaroxaban  15 mg Oral Daily  . simvastatin  10 mg Oral Daily  . tiotropium  18 mcg Inhalation Daily  . vancomycin  1,250 mg Intravenous Q24H  . Vitamin D-3  1,000 Units Oral Daily    Review of Systems - 11 systems reviewed and negative per HPI   OBJECTIVE: Temp:  [99.9 F (37.7 C)-104.5 F (40.3 C)] 100.8 F (38.2 C) (08/22 1500) Pulse Rate:  [28-151] 83 (08/22 1900) Resp:  [20-29] 25 (08/22 1900) BP: (79-164)/(23-119) 108/86 mmHg (08/22 1900) SpO2:  [79 %-98 %] 91 % (08/22 1900) Physical Exam  Constitutional:  Obese, ill appearing confused HENT: [place]/AT, PERRLA, no scleral icterus Mouth/Throat: Oropharynx is clear and dry . No oropharyngeal exudate.  Cardiovascular: Tachy irr  Pulmonary/Chest: poor air  movement, rhonchi Neck = supple, no nuchal rigidity Abdominal: Soft. Bowel sounds are normal.  exhibits no distension. There is no tenderness.  Lymphadenopathy: no cervical adenopathy. No axillary adenopathy Neurological: confused, slow metation Skin: Skin is warm and dry. No rash noted. No erythema.  Psychiatric: a normal mood and affect.    LABS: Results for orders placed or performed during the hospital encounter of 04/06/15 (from the past 48 hour(s))  TSH     Status: None   Collection Time: 04/05/15  5:58 AM  Result Value Ref Range   TSH 3.762 0.350 - 4.500 uIU/mL  Lactic acid, plasma     Status: Abnormal   Collection Time: 04/06/15  3:49 AM  Result Value Ref Range   Lactic Acid, Venous 3.0 (HH) 0.5 - 2.0  mmol/L    Comment: CRITICAL RESULT CALLED TO, READ BACK BY AND VERIFIED WITH LISA THOMPSON AT [phone] 04/06/15.PMH  Comprehensive metabolic panel     Status: Abnormal   Collection Time: 04/06/15  3:49 AM  Result Value Ref Range   Sodium 139 135 - 145 mmol/L   Potassium 5.0 3.5 - 5.1 mmol/L   Chloride 105 101 - 111 mmol/L   CO2 22 22 - 32 mmol/L   Glucose, Bld 114 (H) 65 - 99 mg/dL   BUN 36 (H) 6 - 20 mg/dL   Creatinine, Ser 1.45 (H) 0.44 - 1.00 mg/dL   Calcium 9.0 8.9 - 10.3 mg/dL   Total Protein 6.9 6.5 - 8.1 g/dL   Albumin 3.5 3.5 - 5.0 g/dL   AST 36 15 - 41 U/L   ALT 26 14 - 54 U/L   Alkaline Phosphatase 88 38 - 126 U/L   Total Bilirubin 0.9 0.3 - 1.2 mg/dL   GFR calc non Af Amer 34 (L) >60 mL/min   GFR calc Af Amer 39 (L) >60 mL/min    Comment: (NOTE) The eGFR has been calculated using the CKD EPI equation. This calculation has not been validated in all clinical situations. eGFR's persistently <60 mL/min signify possible Chronic Kidney Disease.    Anion gap 12 5 - 15  Lipase, blood     Status: None   Collection Time: 04/06/15  3:49 AM  Result Value Ref Range   Lipase 49 22 - 51 U/L  Troponin I     Status: None   Collection Time: 04/06/15  3:49 AM  Result Value Ref Range   Troponin I <0.03 <0.031 ng/mL    Comment:        NO INDICATION OF MYOCARDIAL INJURY.   CBC WITH DIFFERENTIAL     Status: Abnormal   Collection Time: 04/06/15  3:49 AM  Result Value Ref Range   WBC 12.1 (H) 3.6 - 11.0 K/uL   RBC 4.19 3.80 - 5.20 MIL/uL   Hemoglobin 14.1 12.0 - 16.0 g/dL   HCT 42.3 35.0 - 47.0 %   MCV 100.9 (H) 80.0 - 100.0 fL   MCH 33.7 26.0 - 34.0 pg   MCHC 33.4 32.0 - 36.0 g/dL   RDW 15.1 (H) 11.5 - 14.5 %   Platelets 222 150 - 440 K/uL   Neutrophils Relative % 95 %   Neutro Abs 11.5 (H) 1.4 - 6.5 K/uL   Lymphocytes Relative 2 %   Lymphs Abs 0.3 (L) 1.0 - 3.6 K/uL   Monocytes Relative 2 %   Monocytes Absolute 0.2 0.2 - 0.9 K/uL   Eosinophils Relative 1 %   Eosinophils  Absolute 0.1 0 - 0.7  K/uL   Basophils Relative 0 %   Basophils Absolute 0.0 0 - 0.1 K/uL  APTT     Status: None   Collection Time: 04/06/15  3:49 AM  Result Value Ref Range   aPTT 28 24 - 36 seconds  Protime-INR     Status: Abnormal   Collection Time: 04/06/15  3:49 AM  Result Value Ref Range   Prothrombin Time 19.6 (H) 11.4 - 15.0 seconds   INR 1.64   Blood Culture (routine x 2)     Status: None (Preliminary result)   Collection Time: 04/06/15  3:49 AM  Result Value Ref Range   Specimen Description BLOOD RIGHT WRIST    Special Requests BOTTLES DRAWN AEROBIC AND ANAEROBIC 4CC    Culture  Setup Time      GRAM POSITIVE COCCI IN BOTH AEROBIC AND ANAEROBIC BOTTLES CRITICAL RESULT CALLED TO, READ BACK BY AND VERIFIED WITH: PAM MYERS ON 04/06/15 AT 1730 BY TB CONFIRMED BY TB/CAF    Culture      GRAM POSITIVE COCCI IN BOTH AEROBIC AND ANAEROBIC BOTTLES IDENTIFICATION TO FOLLOW    Report Status PENDING   Blood Culture (routine x 2)     Status: None (Preliminary result)   Collection Time: 04/06/15  3:49 AM  Result Value Ref Range   Specimen Description BLOOD RIGHT ASSIST CONTROL    Special Requests BOTTLES DRAWN AEROBIC AND ANAEROBIC 5CC    Culture NO GROWTH < 12 HOURS    Report Status PENDING   Urinalysis complete, with microscopic ([facility] only)     Status: Abnormal   Collection Time: 04/06/15  3:49 AM  Result Value Ref Range   Color, Urine YELLOW (A) YELLOW   APPearance CLEAR (A) CLEAR   Glucose, UA NEGATIVE NEGATIVE mg/dL   Bilirubin Urine NEGATIVE NEGATIVE   Ketones, ur NEGATIVE NEGATIVE mg/dL   Specific Gravity, Urine 1.020 1.005 - 1.030   Hgb urine dipstick NEGATIVE NEGATIVE   pH 5.0 5.0 - 8.0   Protein, ur 100 (A) NEGATIVE mg/dL   Nitrite NEGATIVE NEGATIVE   Leukocytes, UA NEGATIVE NEGATIVE   RBC / HPF 0-5 0 - 5 RBC/hpf   WBC, UA 0-5 0 - 5 WBC/hpf   Bacteria, UA FEW (A) NONE SEEN   Squamous Epithelial / LPF NONE SEEN NONE SEEN   Mucous PRESENT   Lactic acid, plasma      Status: Abnormal   Collection Time: 04/06/15  6:59 AM  Result Value Ref Range   Lactic Acid, Venous 2.3 (HH) 0.5 - 2.0 mmol/L    Comment: CRITICAL RESULT CALLED TO, READ BACK BY AND VERIFIED WITH JILL COTRONE AT 0759 BY DAS   Glucose, capillary     Status: Abnormal   Collection Time: 04/06/15 12:08 PM  Result Value Ref Range   Glucose-Capillary 108 (H) 65 - 99 mg/dL  Lactic acid, plasma     Status: Abnormal   Collection Time: 04/06/15  1:30 PM  Result Value Ref Range   Lactic Acid, Venous 2.9 (HH) 0.5 - 2.0 mmol/L    Comment: CRITICAL RESULT CALLED TO, READ BACK BY AND VERIFIED WITH BRITTANY TRAVEIS AT 1419 04/06/15   Culture, sputum-assessment     Status: None   Collection Time: 04/06/15  3:45 PM  Result Value Ref Range   Specimen Description SPUTUM    Special Requests NONE    Sputum evaluation      Sputum specimen not acceptable for testing.  Please recollect.     Report Status 04/06/2015  FINAL   Glucose, capillary     Status: Abnormal   Collection Time: 04/06/15  4:18 PM  Result Value Ref Range   Glucose-Capillary 122 (H) 65 - 99 mg/dL   No components found for: ESR, C REACTIVE PROTEIN MICRO: Recent Results (from the past 720 hour(s))  MRSA PCR Screening     Status: None   Collection Time: 03/16/15 11:46 PM  Result Value Ref Range Status   MRSA by PCR NEGATIVE NEGATIVE Final    Comment:        The GeneXpert MRSA Assay (FDA approved for NASAL specimens only), is one component of a comprehensive MRSA colonization surveillance program. It is not intended to diagnose MRSA infection nor to guide or monitor treatment for MRSA infections.   Blood Culture (routine x 2)     Status: None (Preliminary result)   Collection Time: 04/06/15  3:49 AM  Result Value Ref Range Status   Specimen Description BLOOD RIGHT WRIST  Final   Special Requests BOTTLES DRAWN AEROBIC AND ANAEROBIC 4CC  Final   Culture  Setup Time   Final    GRAM POSITIVE COCCI IN BOTH AEROBIC AND  ANAEROBIC BOTTLES CRITICAL RESULT CALLED TO, READ BACK BY AND VERIFIED WITH: PAM MYERS ON 04/06/15 AT 1730 BY TB CONFIRMED BY TB/CAF    Culture   Final    GRAM POSITIVE COCCI IN BOTH AEROBIC AND ANAEROBIC BOTTLES IDENTIFICATION TO FOLLOW    Report Status PENDING  Incomplete  Blood Culture (routine x 2)     Status: None (Preliminary result)   Collection Time: 04/06/15  3:49 AM  Result Value Ref Range Status   Specimen Description BLOOD RIGHT ASSIST CONTROL  Final   Special Requests BOTTLES DRAWN AEROBIC AND ANAEROBIC 5CC  Final   Culture NO GROWTH < 12 HOURS  Final   Report Status PENDING  Incomplete  Culture, sputum-assessment     Status: None   Collection Time: 04/06/15  3:45 PM  Result Value Ref Range Status   Specimen Description SPUTUM  Final   Special Requests NONE  Final   Sputum evaluation   Final    Sputum specimen not acceptable for testing.  Please recollect.     Report Status 04/06/2015 FINAL  Final    IMAGING: Ct Abdomen Pelvis Wo Contrast  04/06/2015   CLINICAL DATA:  Sepsis and altered mental status. Fever with hypoxia.  EXAM: CT CHEST, ABDOMEN AND PELVIS WITHOUT CONTRAST  TECHNIQUE: Multidetector CT imaging of the chest, abdomen and pelvis was performed following the standard protocol without IV contrast.  COMPARISON:  Chest radiograph earlier this day. CT abdomen 02/15/2013  FINDINGS: CT CHEST FINDINGS  Breathing motion artifact partially limits evaluation. Dependent atelectasis at the lung bases. Ground-glass opacities in the right and left lower lobes and left upper lobe, suggesting pulmonary edema. The heart is enlarged. There are coronary artery calcifications. The thoracic aorta is normal in caliber with mild atherosclerosis. There is no pleural or pericardial effusion. No pathologically enlarged mediastinal adenopathy. Limited assessment for hilar adenopathy given lack of contrast and motion artifact. There are no acute or suspicious osseous abnormalities.  CT  ABDOMEN AND PELVIS FINDINGS  Lack of contrast limits assessment. Allowing for this, no evidence of focal hepatic lesion. The unenhanced gallbladder, spleen, adrenal glands, and pancreas are normal. No hydronephrosis. Bilateral renal cysts, incompletely characterized without contrast. Largest is in the upper left kidney.  The stomach is decompressed. There are no dilated or thickened bowel loops. Distal colonic diverticulosis without  diverticulitis. The appendix is normal. No free air, free fluid, or intra-abdominal fluid collection.  No retroperitoneal adenopathy. Abdominal aorta is normal in caliber. Moderate atherosclerosis of the abdominal aorta and its branches. There is laxity of the anterior abdominal wall with fat containing ventral abdominal wall hernias.  Within the pelvis the urinary bladder is physiologically distended. The uterus is surgically absent. No gross adnexal mass, detailed pelvic evaluation partially obscured by right hip prosthesis.  Postsurgical change in the lumbar spine. Right hip prosthesis in place. There are no acute or suspicious osseous abnormalities.  IMPRESSION: 1. Dependent atelectasis within both lungs. Multifocal ground-glass opacities, suggestive of pulmonary edema. Breathing motion artifact partially limits evaluation. 2. No acute abnormality in the abdomen/pelvis. 3. Atherosclerosis.  Coronary artery calcifications.   Electronically Signed   By: Jeb Levering M.D.   On: 04/06/2015 05:17   Dg Chest 1 View  03/16/2015   CLINICAL DATA:  Subsequent encounter for worsening shortness of Breath after emergency room visit earlier today.  EXAM: CHEST  1 VIEW  COMPARISON:  Earlier the same day  FINDINGS: 1941 hours. Low lung volumes. Assessment a Lung bases limited by patient body habitus and portable technique. There is mild vascular congestion without overt airspace pulmonary edema. Probable atelectasis in the lung bases. The cardio pericardial silhouette is enlarged. Bones are  diffusely demineralized. Telemetry leads overlie the chest.  IMPRESSION: Limited study secondary to body habitus and portable technique.  Low volume film with enlargement of the cardiopericardial silhouette and vascular congestion.   Electronically Signed   By: Misty Stanley M.D.   On: 03/16/2015 19:58   Dg Chest 2 View  03/16/2015   CLINICAL DATA:  Progressive shortness of breath for the past month  EXAM: CHEST  2 VIEW  COMPARISON:  February 10, 2014  FINDINGS: Cardiomegaly is stable. Pulmonary vascularity is normal. There is patchy opacity in the left lower lobe. The lungs elsewhere clear. No adenopathy. There is degenerative change in the thoracic spine.  IMPRESSION: Patchy opacity in the left lower lobe, likely atelectasis. Lungs elsewhere clear. Heart enlarged with pulmonary vascularity within normal limits.   Electronically Signed   By: Lowella Grip III M.D.   On: 03/16/2015 08:04   Ct Head Wo Contrast  04/06/2015   CLINICAL DATA:  Sepsis and altered mental status.  EXAM: CT HEAD WITHOUT CONTRAST  TECHNIQUE: Contiguous axial images were obtained from the base of the skull through the vertex without intravenous contrast.  COMPARISON:  12/08/2011  FINDINGS: No intracranial hemorrhage, mass effect, or midline shift. CSF density space in the left middle cranial fossa likely an arachnoid cyst, unchanged. No associated mass effect. There is periventricular and deep white matter hypodensity, nonspecific, likely related to chronic small vessel ischemia. No hydrocephalus. The basilar cisterns are patent. No evidence of territorial infarct. No intracranial fluid collection. Calvarium is intact. Included paranasal sinuses are well aerated. The previous mastoid air cell opacification has improved with minimal residual opacification of lower left mastoid air cells.  IMPRESSION: No acute intracranial abnormality.   Electronically Signed   By: Jeb Levering M.D.   On: 04/06/2015 05:21   Ct Chest Wo  Contrast  04/06/2015   CLINICAL DATA:  Sepsis and altered mental status. Fever with hypoxia.  EXAM: CT CHEST, ABDOMEN AND PELVIS WITHOUT CONTRAST  TECHNIQUE: Multidetector CT imaging of the chest, abdomen and pelvis was performed following the standard protocol without IV contrast.  COMPARISON:  Chest radiograph earlier this day. CT abdomen 02/15/2013  FINDINGS:  CT CHEST FINDINGS  Breathing motion artifact partially limits evaluation. Dependent atelectasis at the lung bases. Ground-glass opacities in the right and left lower lobes and left upper lobe, suggesting pulmonary edema. The heart is enlarged. There are coronary artery calcifications. The thoracic aorta is normal in caliber with mild atherosclerosis. There is no pleural or pericardial effusion. No pathologically enlarged mediastinal adenopathy. Limited assessment for hilar adenopathy given lack of contrast and motion artifact. There are no acute or suspicious osseous abnormalities.  CT ABDOMEN AND PELVIS FINDINGS  Lack of contrast limits assessment. Allowing for this, no evidence of focal hepatic lesion. The unenhanced gallbladder, spleen, adrenal glands, and pancreas are normal. No hydronephrosis. Bilateral renal cysts, incompletely characterized without contrast. Largest is in the upper left kidney.  The stomach is decompressed. There are no dilated or thickened bowel loops. Distal colonic diverticulosis without diverticulitis. The appendix is normal. No free air, free fluid, or intra-abdominal fluid collection.  No retroperitoneal adenopathy. Abdominal aorta is normal in caliber. Moderate atherosclerosis of the abdominal aorta and its branches. There is laxity of the anterior abdominal wall with fat containing ventral abdominal wall hernias.  Within the pelvis the urinary bladder is physiologically distended. The uterus is surgically absent. No gross adnexal mass, detailed pelvic evaluation partially obscured by right hip prosthesis.  Postsurgical change  in the lumbar spine. Right hip prosthesis in place. There are no acute or suspicious osseous abnormalities.  IMPRESSION: 1. Dependent atelectasis within both lungs. Multifocal ground-glass opacities, suggestive of pulmonary edema. Breathing motion artifact partially limits evaluation. 2. No acute abnormality in the abdomen/pelvis. 3. Atherosclerosis.  Coronary artery calcifications.   Electronically Signed   By: Jeb Levering M.D.   On: 04/06/2015 05:17   Dg Chest Port 1 View  04/06/2015   CLINICAL DATA:  Altered mental status, tachycardia.  EXAM: PORTABLE CHEST - 1 VIEW  COMPARISON:  03/27/2015  FINDINGS: Lung volumes remain low. Cardiomediastinal contours are unchanged with cardiomegaly. Vascular congestion is again seen. There is new perivascular haziness suggestive of pulmonary edema. Limited lung base assessment due to soft tissue attenuation from body habitus. No pneumothorax. Degenerative change seen in both shoulders.  IMPRESSION: Development perivascular haziness suggesting pulmonary edema. Cardiomegaly and vascular congestion are again seen.   Electronically Signed   By: Jeb Levering M.D.   On: 04/06/2015 04:22    Assessment:   Allison Shepard is a 81 y.o. female with COPD, DM admitted with A fib with RVR and fever to 104.  CT chest shows possible ground glass opacities vs pulmonary edema. CT abd pelvis neg. BCx + GPC. C diff negative. UA unimpressive I am concerned given her recent admission and also recent steroid injection in spine that she may have a nosocomial infection.   Recommendations Continue vanco and zosyn.  Add azithromycin for atypical coverage Await culture. Repeat bcx if staph aureus.   Thank you very much for allowing me to participate in the care of this patient. Please call with questions.   Cheral Marker. Ola Spurr, MD

## 2015-04-06 NOTE — Progress Notes (Signed)
PT Cancellation Note  Patient Details Name: CYTHNIA OSMUN MRN: [id number] DOB: [birth year]   Cancelled Treatment:    Reason Eval/Treat Not Completed: Medical issues which prohibited therapy (Consult received and chart reviewed. Per primary RN, recommends hold on PT evaluation until next date.  Will  re-attempt next date as medically appropriate)   Diera Wirkkala H. Manson Passey, PT, DPT, NCS 04/06/2015, 1:14 PM [phone]

## 2015-04-06 NOTE — Consult Note (Addendum)
[facility] CARDIOLOGY [facility]  CARDIOLOGY CONSULT NOTE  Patient ID: TAHLIYAH ANAGNOS MRN: [id number] DOB/AGE: Jul 12, 1934 81 y.o.  Admit date: 04/06/2015 Referring Physician shah Primary Physician   Primary Cardiologist Makenzie Weisner Reason for Consultation Atrial fibrillation with a rapid ventricular response  HPI:  Patient is a 81 year old female with history of atrial fibrillation chronically treated with anticoagulation with Xarelto rate controlled with metoprolol and diltiazem.  She developed  Weakness and fatigue as well as shortness of breath.  She presented to the emergency room  Where she was noted be in atrial fibrillation with a rapid ventricular response.  She also had a cough and a fever of greater than 101. She was felt to have septic symptoms.  She was given IV diltiazem with a drop in her blood pressure below 90 systolic.  Her heart rate and proved after she was given IV digoxin.  She currently is in atrial fibrillation at a rate of 90-100.  She is hemodynamically relatively stable with a systolic blood pressure off of pressors at around 90. She is alert and oriented.  Her shortness of breath has improved.  He etiology of her sepsis is unclear.  ROS Review of Systems - History obtained from chart review and the patient General ROS: positive for  - chills, fatigue and fever Cardiovascular ROS: positive for - dyspnea on exertion, irregular heartbeat, rapid heart rate and shortness of breath Gastrointestinal ROS: no abdominal pain, change in bowel habits, or black or bloody stools Musculoskeletal ROS: negative Neurological ROS: no TIA or stroke symptoms     Past Medical History  Diagnosis Date  . COPD (chronic obstructive pulmonary disease)   . Diabetes mellitus without complication   . Atrial fibrillation   . Hypertension   . Hypercholesteremia     No family history on file.  Social History   Social History  . Marital Status: Married    Spouse Name: N/A   . Number of Children: N/A  . Years of Education: N/A   Occupational History  . Not on file.   Social History Main Topics  . Smoking status: Never Smoker   . Smokeless tobacco: Not on file  . Alcohol Use: 0.6 oz/week    0 Standard drinks or equivalent, 1 Glasses of wine per week     Comment: daily  . Drug Use: No  . Sexual Activity: Not on file   Other Topics Concern  . Not on file   Social History Narrative    Past Surgical History  Procedure Laterality Date  . Abdominal hysterectomy    . Shoulder fusion surgery Right   . Knee surgery Right   . Hip surgery Right   . Back surgery       Prescriptions prior to admission  Medication Sig Dispense Refill Last Dose  . albuterol (PROVENTIL HFA;VENTOLIN HFA) 108 (90 BASE) MCG/ACT inhaler Inhale 2 puffs into the lungs every 6 (six) hours as needed for wheezing or shortness of breath.   04/05/2015 at Unknown time  . albuterol (PROVENTIL) (2.5 MG/3ML) 0.083% nebulizer solution Take 2.5 mg by nebulization every 6 (six) hours as needed for wheezing or shortness of breath.   04/06/2015 at Unknown time  . Cholecalciferol (VITAMIN D-3) 1000 UNITS CAPS Take 1,000 Units by mouth daily.   04/06/2015 at Unknown time  . diltiazem (TIAZAC) 180 MG 24 hr capsule Take 180 mg by mouth daily.   04/06/2015 at Unknown time  . donepezil (ARICEPT) 5 MG tablet Take 5  mg by mouth at bedtime.   04/05/2015 at Unknown time  . furosemide (LASIX) 40 MG tablet Take 40 mg by mouth daily.   04/06/2015 at Unknown time  . insulin aspart (NOVOLOG) 100 UNIT/ML injection Inject 25-30 Units into the skin 3 (three) times daily with meals.    04/06/2015 at Unknown time  . insulin glargine (LANTUS) 100 UNIT/ML injection Inject 25 Units into the skin 2 (two) times daily.    04/06/2015 at Unknown time  . lisinopril (PRINIVIL,ZESTRIL) 5 MG tablet Take 5 mg by mouth daily.   04/06/2015 at Unknown time  . meloxicam (MOBIC) 7.5 MG tablet Take 1 tablet (7.5 mg total) by mouth 2 (two) times  daily after a meal. 28 tablet 2 04/06/2015 at Unknown time  . metoprolol succinate (TOPROL-XL) 100 MG 24 hr tablet Take 100 mg by mouth daily. Take with or immediately following a meal.   04/06/2015 at Unknown time  . Multiple Vitamins-Minerals (AIRBORNE PO) Take 1 tablet by mouth daily.    04/06/2015 at Unknown time  . predniSONE (STERAPRED UNI-PAK 21 TAB) 10 MG (21) TBPK tablet Take 1 tablet (10 mg total) by mouth daily. Label  & dispense according to the schedule below.  6 tablets day one, then 5 table day 2, then 4 tablets day 3, then 3 tablets day 4, 2 tablets day 5, then 1 tablet day 6, then stop 61 tablet 0 04/06/2015 at Unknown time  . rivaroxaban (XARELTO) 20 MG TABS tablet Take 20 mg by mouth daily.   04/05/2015 at Unknown time  . simvastatin (ZOCOR) 10 MG tablet Take 10 mg by mouth daily.   04/06/2015 at Unknown time  . Umeclidinium-Vilanterol (ANORO ELLIPTA) 62.5-25 MCG/INH AEPB Inhale 1 puff into the lungs daily.    04/06/2015 at Unknown time  . zolpidem (AMBIEN) 5 MG tablet Take 5 mg by mouth at bedtime as needed for sleep.   prn at prn  . fluticasone (FLONASE) 50 MCG/ACT nasal spray Place 1 spray into both nostrils daily. 16 g 2 Taking  . guaiFENesin (MUCINEX) 600 MG 12 hr tablet Take 1 tablet (600 mg total) by mouth 2 (two) times daily. 20 tablet 0 Taking    Physical Exam: Blood pressure 94/75, pulse 48, temperature 100.4 F (38 C), temperature source Rectal, resp. rate 21, SpO2 93 %. .   General appearance: alert and cooperative Resp: diminished breath sounds bilaterally Cardio: irregularly irregular rhythm GI: soft, non-tender; bowel sounds normal; no masses,  no organomegaly Extremities: extremities normal, atraumatic, no cyanosis or edema Neurologic: Grossly normal Labs:   Lab Results  Component Value Date   WBC 12.1* 04/06/2015   HGB 14.1 04/06/2015   HCT 42.3 04/06/2015   MCV 100.9* 04/06/2015   PLT 222 04/06/2015    Recent Labs Lab 04/06/15 0349  NA 139  K 5.0  CL  105  CO2 22  BUN 36*  CREATININE 1.45*  CALCIUM 9.0  PROT 6.9  BILITOT 0.9  ALKPHOS 88  ALT 26  AST 36  GLUCOSE 114*   Lab Results  Component Value Date   TROPONINI <0.03 04/06/2015      Radiology:   Cardiomegaly and vascular congestion EKG:  Atrial fibrillation with rapid ventricular response  ASSESSMENT AND PLAN:   81 year old female with chronic atrial fibrillation now presenting with sepsis picture hypertension and atrial fibrillation rapid ventricular response.  Rate is not improved significantly with IV diltiazem.  She is on diltiazem and metoprolol on hold for rate control and Xarelto  for anticoagulation.  Will continue with p.r.n. Digoxin for rate control.  Would recommend aggressive treatment of her sepsis.  She currently is stable.  Will continue with IV Cardizem and converted back to p.o. If  She remains in good rate control and is hemodynamically stable. Signed: Dalia Heading MD, [facility] 04/06/2015, 5:08 PM

## 2015-04-06 NOTE — Progress Notes (Signed)
Pharmacy Note - rivaroxaban - renal dose adjustment   Creatinine clearance: 107ml/min (via crocroft-gault using actual body weight)   Patient with orders for rivaroxaban  PO daily for atrial fibrillation, continued from home meds. Will adjust for renal function to rivaroxaban  PO daily per protocol (for estCrCl 15-43ml/min).  Pharmacy to follow per anticoagulation policy.  Garlon Hatchet, PharmD Clinical Pharmacist  04/06/2015 12:16 PM

## 2015-04-06 NOTE — Care Management Note (Signed)
Case Management Note  Patient Details  Name: Allison Shepard MRN: [id number] Date of Birth:MAXWELL MARTORANOjective/Objective:  Previously at [facility] 08/01-08/05 for COPD exacerbation. It was recommended that patient be discharged to SNF and she refused. Was discharged with new home O2 and home health referral to [facility].  Per [facility]patient was never opened to home health or PT services because she repeatedly would not allow them to come out and open services. Admitted to CCU with sepsis. Will follow progression.  It should be noted that her husband is in CCU at [facility] as well.                  Action/Plan:   Expected Discharge Date:                  Expected Discharge Plan:    In-House Referral:     Discharge planning Services     Post Acute Care Choice:    Choice offered to:     DME Arranged:    DME Agency:     HH Arranged:    HH Agency:    Status of Service:  In process, will continue to follow  Medicare Important Message Given:    Date Medicare IM Given:    Medicare IM give by:    Date Additional Medicare IM Given:    Additional Medicare Important Message give by:     If discussed at Long Length of Stay Meetings, dates discussed:    Additional Comments:  Marily Memos, RN 04/06/2015, 2:57 PM

## 2015-04-06 NOTE — ED Notes (Addendum)
Pt presents via EMS. EMS called for AMS. Pt tachycardiac in 170's upon EMS arrival, SaO2 79% on room air. EMS started PIV R hand #22 ga, administered cardizem PIV. Pt vomiting bile after arrival. Pt is extremely axious, hyperventilating, agitated movements.

## 2015-04-06 NOTE — ED Notes (Signed)
Pt returned from CT; MD notified lactic acid 3.0 per lab

## 2015-04-06 NOTE — ED Provider Notes (Signed)
[facility] Emergency Department Provider Note  ____________________________________________  Time seen: 3:10 AM on arrival by EMS  I have reviewed the triage vital signs and the nursing notes.   HISTORY  Chief Complaint Altered Mental Status  history Limited by patient's altered mental status, additional history obtained from the patient's daughter.   HPI Allison Shepard is a 81 y.o. female is brought to the ED by EMS for altered mental status. They found that she is tachycardic with a heart rate of 160-180 with a room air saturation of 79%.The daughter notes that the patient has had history of sepsis from UTI. Denies any recent trauma.     Past Medical History  Diagnosis Date  . COPD (chronic obstructive pulmonary disease)   . Diabetes mellitus without complication   . Atrial fibrillation   . Hypertension   . Hypercholesteremia     Patient Active Problem List   Diagnosis Date Noted  . Chronic a-fib 03/20/2015  . Acute respiratory distress 03/16/2015    Past Surgical History  Procedure Laterality Date  . Abdominal hysterectomy    . Shoulder fusion surgery Right   . Knee surgery Right   . Hip surgery Right   . Back surgery      Current Outpatient Rx  Name  Route  Sig  Dispense  Refill  . albuterol (PROVENTIL HFA;VENTOLIN HFA) 108 (90 BASE) MCG/ACT inhaler   Inhalation   Inhale 2 puffs into the lungs every 6 (six) hours as needed for wheezing or shortness of breath.         Marland Kitchen albuterol (PROVENTIL) (2.5 MG/3ML) 0.083% nebulizer solution   Nebulization   Take 2.5 mg by nebulization every 6 (six) hours as needed for wheezing or shortness of breath.         . Cholecalciferol (VITAMIN D-3) 1000 UNITS CAPS   Oral   Take 1,000 Units by mouth daily.         Marland Kitchen diltiazem (TIAZAC) 180 MG 24 hr capsule   Oral   Take 180 mg by mouth daily.         . fluticasone (FLONASE) 50 MCG/ACT nasal spray   Each Nare   Place 1 spray into both  nostrils daily.   16 g   2   . furosemide (LASIX) 40 MG tablet   Oral   Take 40 mg by mouth daily.         Marland Kitchen guaiFENesin (MUCINEX) 600 MG 12 hr tablet   Oral   Take 1 tablet (600 mg total) by mouth 2 (two) times daily.   20 tablet   0   . insulin aspart (NOVOLOG) 100 UNIT/ML injection   Subcutaneous   Inject 25 Units into the skin 3 (three) times daily.          . insulin glargine (LANTUS) 100 UNIT/ML injection   Subcutaneous   Inject 25 Units into the skin 2 (two) times daily.          Marland Kitchen lisinopril (PRINIVIL,ZESTRIL) 5 MG tablet   Oral   Take 5 mg by mouth daily.         . meloxicam (MOBIC) 7.5 MG tablet   Oral   Take 1 tablet (7.5 mg total) by mouth 2 (two) times daily after a meal.   28 tablet   2   . Multiple Vitamins-Minerals (AIRBORNE PO)   Oral   Take 1 tablet by mouth daily.          Marland Kitchen  predniSONE (STERAPRED UNI-PAK 21 TAB) 10 MG (21) TBPK tablet   Oral   Take 1 tablet (10 mg total) by mouth daily. Label  & dispense according to the schedule below.  6 tablets day one, then 5 table day 2, then 4 tablets day 3, then 3 tablets day 4, 2 tablets day 5, then 1 tablet day 6, then stop   61 tablet   0   . rivaroxaban (XARELTO) 20 MG TABS tablet   Oral   Take 20 mg by mouth daily.         . simvastatin (ZOCOR) 10 MG tablet   Oral   Take 10 mg by mouth daily.         Marland Kitchen Umeclidinium-Vilanterol (ANORO ELLIPTA) 62.5-25 MCG/INH AEPB   Inhalation   Inhale 1 puff into the lungs daily.            Allergies Hydromorphone hcl; Ibuprofen; and Percocet  No family history on file.  Social History Social History  Substance Use Topics  . Smoking status: Never Smoker   . Smokeless tobacco: None  . Alcohol Use: 0.6 oz/week    0 Standard drinks or equivalent, 1 Glasses of wine per week     Comment: daily    Review of Systems  Constitutional: No fever or chills. No weight changes Eyes:No blurry vision or double vision.  ENT: No sore  throat. Cardiovascular: No chest pain. Respiratory: No dyspnea or cough. Gastrointestinal: Negative for abdominal pain, vomiting and diarrhea.  No BRBPR or melena. Genitourinary: Negative for dysuria, urinary retention, bloody urine, or difficulty urinating. Musculoskeletal: Negative for back pain. No joint swelling or pain. Skin: Negative for rash. Neurological: Confusion and altered mental status Psychiatric:No anxiety or depression.   Endocrine:No hot/cold intolerance, changes in energy, or sleep difficulty.  10-point ROS otherwise negative.  ____________________________________________   PHYSICAL EXAM:  VITAL SIGNS: ED Triage Vitals  Enc Vitals Group     BP 04/06/15 0400 164/119 mmHg     Pulse Rate 04/06/15 0330 126     Resp 04/06/15 0330 26     Temp 04/06/15 0345 103.9 F (39.9 C)     Temp Source 04/06/15 0345 Rectal     SpO2 04/06/15 0308 79 %     Weight --      Height --      Head Cir --      Peak Flow --      Pain Score --      Pain Loc --      Pain Edu? --      Excl. in GC? --      Constitutional: Confused, unable to answer questions appropriately. Eyes: No scleral icterus. No conjunctival pallor. PERRL. EOMI ENT   Head: Normocephalic and atraumatic.   Nose: No congestion/rhinnorhea. No septal hematoma   Mouth/Throat: Dry mucous membranes, no pharyngeal erythema. No peritonsillar mass. No uvula shift.   Neck: No stridor. No SubQ emphysema. No meningismus. Hematological/Lymphatic/Immunilogical: No cervical lymphadenopathy. Cardiovascular: Irregularly irregular rhythm with a heart rate of 140-170. Normal and symmetric distal pulses are present in all extremities. No murmurs, rubs, or gallops. Respiratory: Normal respiratory effort without tachypnea nor retractions. Breath sounds are clear and equal bilaterally. No wheezes/rales/rhonchi. Gastrointestinal: Soft with epigastric tenderness. No distention. There is no CVA tenderness.  No rebound,  rigidity, or guarding. Genitourinary: Normal external genitalia, foul-smelling urine odor on the underwear Musculoskeletal: Nontender with normal range of motion in all extremities. No joint effusions.  No lower extremity tenderness.  No edema. Neurologic:   GCS = [id number] = 14 CN 2-10 normal. Motor grossly intact.   Skin:  Skin is warm, dry and intact. No rash noted.  No petechiae, purpura, or bullae.   ____________________________________________    LABS (pertinent positives/negatives) (all labs ordered are listed, but only abnormal results are displayed) Labs Reviewed  LACTIC ACID, PLASMA - Abnormal; Notable for the following:    Lactic Acid, Venous 3.0 (*)    All other components within normal limits  COMPREHENSIVE METABOLIC PANEL - Abnormal; Notable for the following:    Glucose, Bld 114 (*)    BUN 36 (*)    Creatinine, Ser 1.45 (*)    GFR calc non Af Amer 34 (*)    GFR calc Af Amer 39 (*)    All other components within normal limits  CBC WITH DIFFERENTIAL/PLATELET - Abnormal; Notable for the following:    WBC 12.1 (*)    MCV 100.9 (*)    RDW 15.1 (*)    Neutro Abs 11.5 (*)    Lymphs Abs 0.3 (*)    All other components within normal limits  PROTIME-INR - Abnormal; Notable for the following:    Prothrombin Time 19.6 (*)    All other components within normal limits  URINALYSIS COMPLETEWITH MICROSCOPIC ([facility] ONLY) - Abnormal; Notable for the following:    Color, Urine YELLOW (*)    APPearance CLEAR (*)    Protein, ur 100 (*)    Bacteria, UA FEW (*)    All other components within normal limits  CULTURE, BLOOD (ROUTINE X 2)  CULTURE, BLOOD (ROUTINE X 2)  URINE CULTURE  CULTURE, EXPECTORATED SPUTUM-ASSESSMENT  LIPASE, BLOOD  TROPONIN I  APTT  TSH  LACTIC ACID, PLASMA   ____________________________________________   EKG  Interpreted by me Atrial fibrillation with a rate of 136, normal axis and intervals, normal QRS and ST segments and T  waves.  ____________________________________________    RADIOLOGY  Chest x-ray consistent with pulmonary edema. CT head chest abdomen pelvis unremarkable except for pulmonary edema.  ____________________________________________   PROCEDURES CRITICAL CARE Performed by: Scotty Court, Leeloo Silverthorne   Total critical care time: 35 minutes  Critical care time was exclusive of separately billable procedures and treating other patients.  Critical care was necessary to treat or prevent imminent or life-threatening deterioration.  Critical care was time spent personally by me on the following activities: development of treatment plan with patient and/or surrogate as well as nursing, discussions with consultants, evaluation of patient's response to treatment, examination of patient, obtaining history from patient or surrogate, ordering and performing treatments and interventions, ordering and review of laboratory studies, ordering and review of radiographic studies, pulse oximetry and re-evaluation of patient's condition.  ____________________________________________   INITIAL IMPRESSION / ASSESSMENT AND PLAN / ED COURSE  Pertinent labs & imaging results that were available during my care of the patient were reviewed by me and considered in my medical decision making (see chart for details).  Patient presents with A. fib with RVR and fever to 104 rectally consistent with systemic inflammatory response syndrome. Clinically and by history likely to be urinary tract infection causing sepsis. We'll give empiric antibiotics as well as IV fluid bolus resuscitation while awaiting labs. We'll also plan to CT head chest abdomen pelvis due to critical illness, clinical uncertainty due to lack of history, and finding of epigastric tenderness. Review of prior results shows that she has chronic renal insufficiency and with her acute critical illness I expect that her renal  function will not tolerate IV contrast and  she is unable to take oral contrast that this will be a noncontrast study.  ----------------------------------------- 6:07 AM on 04/06/2015 -----------------------------------------  Radiology her results reveal pulmonary edema but no other acute infectious source. Still waiting for labs. Lactate is 3. After 1 L IV fluids heart rate had not improved, so she was given diltiazem 10 mg IV push, which did not produce any effect so she is given an additional 50 mg IV push of diltiazem. Heart rate remains 130 to 150, so we will start a diltiazem drip. Fevers improving to 101 after IV fluids and Tylenol suppository. ----------------------------------------- 6:22 AM on 04/06/2015 -----------------------------------------  On reassessment, with fever improved to 101.1, patient is now alert and responsive, aware of current events including the fact that her husband is currently hospitalized, interacting appropriately. Denies any headache or neck pain at all. Moving her neck all around without difficulty. Despite the inconclusive pulmonary imaging and urinalysis, there is very low clinical suspicion for encephalitis or meningitis at this time. With her initial hypoxia along with tachycardia tachypnea and fever, it is still most likely that she has pneumonia. We will continue IV fluids and antibiotics. Still awaiting lab results. Nurse Misty Stanley and I have both called the lab on several different occasions to confirm that they have blood samples which they do. It is unclear why there is been such delay in obtaining results.  ----------------------------------------- 7:41 AM on 04/06/2015 ----------------------------------------- Labs resulted, no new significant findings. Discussed with hospitalist who will evaluate for admission.  ____________________________________________   FINAL CLINICAL IMPRESSION(S) / ED DIAGNOSES  Final diagnoses:  Sepsis, due to unspecified organism  Atrial fibrillation with rapid  ventricular response  Hypoxia   sepsis with delirium    Sharman Cheek, MD 04/06/15 [phone]

## 2015-04-06 NOTE — H&P (Signed)
[facility] - [place] at [facility]   PATIENT NAME: Allison Shepard    MR#:  [id number]  DATE OF BIRTH:  Jul 16, 1934  DATE OF ADMISSION:  04/06/2015  PRIMARY CARE PHYSICIAN: Mickey Farber, MD   REQUESTING/REFERRING PHYSICIAN: Sharman Cheek, MD  CHIEF COMPLAINT:   Chief Complaint  Patient presents with  . Altered Mental Status   fever  HISTORY OF PRESENT ILLNESS:  Allison Shepard  is a 81 y.o. female with a known history of COPD, diabetes is being admitted for sepsis.  Patient woke up in the middle of night around 2:00 in the morning looking for her husband as he was sick and was brought into the emergency department here.  As per her daughter who lives with her Patient was having shaking and chills.  Her daughter was worried about low blood sugar as patient was looking for Sprite.  Her sugar was 116.  As per daughter.  But her chills continued and her daughter also brought her to the emergency department via EMS.  As per EMS note and ED physician.  Patient was quite altered mentally.  She was also tachycardic with a heart rate of 160-180 with a room air saturation of 79%.  She had a temperature as high as up to 104.5.  When I evaluated the patient.  She is somewhat confused, hypotensive, tachycardic and febrile.  She just had a loose watery brown stool While in the ED. PAST MEDICAL HISTORY:   Past Medical History  Diagnosis Date  . COPD (chronic obstructive pulmonary disease)   . Diabetes mellitus without complication   . Atrial fibrillation   . Hypertension   . Hypercholesteremia    PAST SURGICAL HISTORY:   Past Surgical History  Procedure Laterality Date  . Abdominal hysterectomy    . Shoulder fusion surgery Right   . Knee surgery Right   . Hip surgery Right   . Back surgery     SOCIAL HISTORY:   Social History  Substance Use Topics  . Smoking status: Never Smoker   . Smokeless tobacco: Not on file  . Alcohol Use: 0.6 oz/week    0  Standard drinks or equivalent, 1 Glasses of wine per week     Comment: daily   FAMILY HISTORY:  . Hypertension Father  . Hyperlipidemia Father  . Stroke Father  DRUG ALLERGIES:   Allergies  Allergen Reactions  . Hydromorphone Hcl Nausea And Vomiting  . Ibuprofen Other (See Comments)    Pt was told by her MD not to take this medication.    Marland Kitchen Percocet [Oxycodone-Acetaminophen] Hives   REVIEW OF SYSTEMS:  Review of Systems  Unable to perform ROS: critical illness   MEDICATIONS AT HOME:   Prior to Admission medications   Medication Sig Start Date End Date Taking? Authorizing Provider  albuterol (PROVENTIL HFA;VENTOLIN HFA) 108 (90 BASE) MCG/ACT inhaler Inhale 2 puffs into the lungs every 6 (six) hours as needed for wheezing or shortness of breath.   Yes Historical Provider, MD  albuterol (PROVENTIL) (2.5 MG/3ML) 0.083% nebulizer solution Take 2.5 mg by nebulization every 6 (six) hours as needed for wheezing or shortness of breath.   Yes Historical Provider, MD  Cholecalciferol (VITAMIN D-3) 1000 UNITS CAPS Take 1,000 Units by mouth daily.   Yes Historical Provider, MD  diltiazem (TIAZAC) 180 MG 24 hr capsule Take 180 mg by mouth daily.   Yes Historical Provider, MD  donepezil (ARICEPT) 5 MG tablet Take 5 mg by mouth at bedtime.  Yes Historical Provider, MD  furosemide (LASIX) 40 MG tablet Take 40 mg by mouth daily.   Yes Historical Provider, MD  insulin aspart (NOVOLOG) 100 UNIT/ML injection Inject 25-30 Units into the skin 3 (three) times daily with meals.    Yes Historical Provider, MD  insulin glargine (LANTUS) 100 UNIT/ML injection Inject 25 Units into the skin 2 (two) times daily.    Yes Historical Provider, MD  lisinopril (PRINIVIL,ZESTRIL) 5 MG tablet Take 5 mg by mouth daily.   Yes Historical Provider, MD  meloxicam (MOBIC) 7.5 MG tablet Take 1 tablet (7.5 mg total) by mouth 2 (two) times daily after a meal. 03/11/15  Yes Tod Persia, MD  metoprolol succinate (TOPROL-XL) 100  MG 24 hr tablet Take 100 mg by mouth daily. Take with or immediately following a meal.   Yes Historical Provider, MD  Multiple Vitamins-Minerals (AIRBORNE PO) Take 1 tablet by mouth daily.    Yes Historical Provider, MD  predniSONE (STERAPRED UNI-PAK 21 TAB) 10 MG (21) TBPK tablet Take 1 tablet (10 mg total) by mouth daily. Label  & dispense according to the schedule below.  6 tablets day one, then 5 table day 2, then 4 tablets day 3, then 3 tablets day 4, 2 tablets day 5, then 1 tablet day 6, then stop 03/20/15  Yes Auburn Bilberry, MD  rivaroxaban (XARELTO) 20 MG TABS tablet Take 20 mg by mouth daily.   Yes Historical Provider, MD  simvastatin (ZOCOR) 10 MG tablet Take 10 mg by mouth daily.   Yes Historical Provider, MD  Umeclidinium-Vilanterol (ANORO ELLIPTA) 62.5-25 MCG/INH AEPB Inhale 1 puff into the lungs daily.    Yes Historical Provider, MD  zolpidem (AMBIEN) 5 MG tablet Take 5 mg by mouth at bedtime as needed for sleep.   Yes Historical Provider, MD  fluticasone (FLONASE) 50 MCG/ACT nasal spray Place 1 spray into both nostrils daily. 03/20/15   Auburn Bilberry, MD  guaiFENesin (MUCINEX) 600 MG 12 hr tablet Take 1 tablet (600 mg total) by mouth 2 (two) times daily. 03/20/15   Auburn Bilberry, MD      VITAL SIGNS:  Blood pressure 79/62, pulse 28, temperature 100.1 F (37.8 C), temperature source Rectal, resp. rate 25, SpO2 95 %. PHYSICAL EXAMINATION:  Physical Exam  Constitutional: She appears lethargic and dehydrated. She appears unhealthy. She appears toxic. She has a sickly appearance.  HENT:  Head: Normocephalic and atraumatic.  Eyes: Conjunctivae and EOM are normal. Pupils are equal, round, and reactive to light.  Neck: Normal range of motion. Neck supple. No tracheal deviation present. No thyromegaly present.  Cardiovascular: An irregularly irregular rhythm present. Tachycardia present.  Exam reveals no gallop and no friction rub.   Pulmonary/Chest: Accessory muscle usage present.  Tachypnea noted. She is in respiratory distress. She has no wheezes. She has rhonchi in the right lower field. She exhibits no tenderness.  Abdominal: Soft. Bowel sounds are normal. She exhibits no distension. There is no tenderness.  Musculoskeletal: Normal range of motion.  Neurological: She appears lethargic. She is disoriented. She displays weakness. No cranial nerve deficit.  Skin: Skin is warm. No rash noted. She is diaphoretic.  Psychiatric: Mood and affect normal.   LABORATORY PANEL:   CBC  Recent Labs Lab 04/06/15 0349  WBC 12.1*  HGB 14.1  HCT 42.3  PLT 222   ------------------------------------------------------------------------------------------------------------------  Chemistries   Recent Labs Lab 04/06/15 0349  NA 139  K 5.0  CL 105  CO2 22  GLUCOSE 114*  BUN  36*  CREATININE 1.45*  CALCIUM 9.0  AST 36  ALT 26  ALKPHOS 88  BILITOT 0.9   ------------------------------------------------------------------------------------------------------------------  Cardiac Enzymes  Recent Labs Lab 04/06/15 0349  TROPONINI <0.03   ------------------------------------------------------------------------------------------------------------------  RADIOLOGY:  Ct Abdomen Pelvis Wo Contrast  04/06/2015   CLINICAL DATA:  Sepsis and altered mental status. Fever with hypoxia.  EXAM: CT CHEST, ABDOMEN AND PELVIS WITHOUT CONTRAST  TECHNIQUE: Multidetector CT imaging of the chest, abdomen and pelvis was performed following the standard protocol without IV contrast.  COMPARISON:  Chest radiograph earlier this day. CT abdomen 02/15/2013  FINDINGS: CT CHEST FINDINGS  Breathing motion artifact partially limits evaluation. Dependent atelectasis at the lung bases. Ground-glass opacities in the right and left lower lobes and left upper lobe, suggesting pulmonary edema. The heart is enlarged. There are coronary artery calcifications. The thoracic aorta is normal in caliber with mild  atherosclerosis. There is no pleural or pericardial effusion. No pathologically enlarged mediastinal adenopathy. Limited assessment for hilar adenopathy given lack of contrast and motion artifact. There are no acute or suspicious osseous abnormalities.  CT ABDOMEN AND PELVIS FINDINGS  Lack of contrast limits assessment. Allowing for this, no evidence of focal hepatic lesion. The unenhanced gallbladder, spleen, adrenal glands, and pancreas are normal. No hydronephrosis. Bilateral renal cysts, incompletely characterized without contrast. Largest is in the upper left kidney.  The stomach is decompressed. There are no dilated or thickened bowel loops. Distal colonic diverticulosis without diverticulitis. The appendix is normal. No free air, free fluid, or intra-abdominal fluid collection.  No retroperitoneal adenopathy. Abdominal aorta is normal in caliber. Moderate atherosclerosis of the abdominal aorta and its branches. There is laxity of the anterior abdominal wall with fat containing ventral abdominal wall hernias.  Within the pelvis the urinary bladder is physiologically distended. The uterus is surgically absent. No gross adnexal mass, detailed pelvic evaluation partially obscured by right hip prosthesis.  Postsurgical change in the lumbar spine. Right hip prosthesis in place. There are no acute or suspicious osseous abnormalities.  IMPRESSION: 1. Dependent atelectasis within both lungs. Multifocal ground-glass opacities, suggestive of pulmonary edema. Breathing motion artifact partially limits evaluation. 2. No acute abnormality in the abdomen/pelvis. 3. Atherosclerosis.  Coronary artery calcifications.   Electronically Signed   By: Rubye Oaks M.D.   On: 04/06/2015 05:17   Ct Head Wo Contrast  04/06/2015   CLINICAL DATA:  Sepsis and altered mental status.  EXAM: CT HEAD WITHOUT CONTRAST  TECHNIQUE: Contiguous axial images were obtained from the base of the skull through the vertex without intravenous  contrast.  COMPARISON:  12/08/2011  FINDINGS: No intracranial hemorrhage, mass effect, or midline shift. CSF density space in the left middle cranial fossa likely an arachnoid cyst, unchanged. No associated mass effect. There is periventricular and deep white matter hypodensity, nonspecific, likely related to chronic small vessel ischemia. No hydrocephalus. The basilar cisterns are patent. No evidence of territorial infarct. No intracranial fluid collection. Calvarium is intact. Included paranasal sinuses are well aerated. The previous mastoid air cell opacification has improved with minimal residual opacification of lower left mastoid air cells.  IMPRESSION: No acute intracranial abnormality.   Electronically Signed   By: Rubye Oaks M.D.   On: 04/06/2015 05:21   Ct Chest Wo Contrast  04/06/2015   CLINICAL DATA:  Sepsis and altered mental status. Fever with hypoxia.  EXAM: CT CHEST, ABDOMEN AND PELVIS WITHOUT CONTRAST  TECHNIQUE: Multidetector CT imaging of the chest, abdomen and pelvis was performed following the standard protocol  without IV contrast.  COMPARISON:  Chest radiograph earlier this day. CT abdomen 02/15/2013  FINDINGS: CT CHEST FINDINGS  Breathing motion artifact partially limits evaluation. Dependent atelectasis at the lung bases. Ground-glass opacities in the right and left lower lobes and left upper lobe, suggesting pulmonary edema. The heart is enlarged. There are coronary artery calcifications. The thoracic aorta is normal in caliber with mild atherosclerosis. There is no pleural or pericardial effusion. No pathologically enlarged mediastinal adenopathy. Limited assessment for hilar adenopathy given lack of contrast and motion artifact. There are no acute or suspicious osseous abnormalities.  CT ABDOMEN AND PELVIS FINDINGS  Lack of contrast limits assessment. Allowing for this, no evidence of focal hepatic lesion. The unenhanced gallbladder, spleen, adrenal glands, and pancreas are  normal. No hydronephrosis. Bilateral renal cysts, incompletely characterized without contrast. Largest is in the upper left kidney.  The stomach is decompressed. There are no dilated or thickened bowel loops. Distal colonic diverticulosis without diverticulitis. The appendix is normal. No free air, free fluid, or intra-abdominal fluid collection.  No retroperitoneal adenopathy. Abdominal aorta is normal in caliber. Moderate atherosclerosis of the abdominal aorta and its branches. There is laxity of the anterior abdominal wall with fat containing ventral abdominal wall hernias.  Within the pelvis the urinary bladder is physiologically distended. The uterus is surgically absent. No gross adnexal mass, detailed pelvic evaluation partially obscured by right hip prosthesis.  Postsurgical change in the lumbar spine. Right hip prosthesis in place. There are no acute or suspicious osseous abnormalities.  IMPRESSION: 1. Dependent atelectasis within both lungs. Multifocal ground-glass opacities, suggestive of pulmonary edema. Breathing motion artifact partially limits evaluation. 2. No acute abnormality in the abdomen/pelvis. 3. Atherosclerosis.  Coronary artery calcifications.   Electronically Signed   By: Rubye Oaks M.D.   On: 04/06/2015 05:17   Dg Chest Port 1 View  04/06/2015   CLINICAL DATA:  Altered mental status, tachycardia.  EXAM: PORTABLE CHEST - 1 VIEW  COMPARISON:  03/27/2015  FINDINGS: Lung volumes remain low. Cardiomediastinal contours are unchanged with cardiomegaly. Vascular congestion is again seen. There is new perivascular haziness suggestive of pulmonary edema. Limited lung base assessment due to soft tissue attenuation from body habitus. No pneumothorax. Degenerative change seen in both shoulders.  IMPRESSION: Development perivascular haziness suggesting pulmonary edema. Cardiomegaly and vascular congestion are again seen.   Electronically Signed   By: Rubye Oaks M.D.   On: 04/06/2015 04:22    IMPRESSION AND PLAN:  * Septic shock: With fever, tachycardia, tachypnea, leukocytosis and hypoxia.  Could be due to underlying pneumonia although cannot rule out GI infection including C. Difficile.  For now we will cover with IV vancomycin and Zosyn.  Lactate was elevated to 3.0.  We will admitted to CCU with sepsis protocol.  Start Levophed as needed for MAP > 65  * Rapid atrial fibrillation: Likely due to sepsis, she was given IV Cardizem push and now has been started on Cardizem drip.  She is already on Xarelto for anticoagulation. has known Chronic A. fib -  followed by Dr. Lady Gary, discussed with him.  He will see her - may benefit from digoxin   * Diabetes mellitus: Check hemoglobin A1c, continue home regimen with decreased dose of Lantus and consult diabetic nurse coordinator will also start on sliding scale insulin   * COPD: Continue her home inhalers and monitor on nasal cannula oxygen consider BiPAP if needed.  We will consider pulmonary consult as needed while in CCU   All the  records are reviewed and case discussed with ED provider. Management plans discussed with the patient, family (Daughter on the phone who will come to discuss her CODE STATUS either later today/tomorrow) and they are in agreement.  CODE STATUS: full code  TOTAL TIME (critical care )TAKING CARE OF THIS PATIENT: 45 minutes.   She is critically ill and at very high risk for ACUTE CARDIORESPIRATORY FAILURE, multiorgan failure and death.  discussed with her daughter.   [facility], Hriday Stai M.D on 04/06/2015 at 10:44 AM  Between 7am to 6pm - Pager - [phone]  After 6pm go to [email or web address] - password [email or web address] [facility]  [facility]  Office  [phone]  CC: Primary care physician; Mickey Farber, MD

## 2015-04-06 NOTE — ED Notes (Signed)
Xray tech at bedside for portable.

## 2015-04-06 NOTE — ED Notes (Signed)
Daughter to bedside reports she was here earlier with her father (pt's husband) who was admitted; st while at home, she heard the pt calling out for her husband; upon entering her room, noted that she was shaking and thought she was having difficulty with her blood sugar so called EMS

## 2015-04-06 NOTE — Consult Note (Addendum)
Endocrinology Inpatient Consultation  Consult for: inpatient diabetes management  HPI: Allison Shepard is a 81 y.o. female with a known history of COPD and type 2 diabetes diagnosed 03/2014 who was admitted for septic shock thought to be secondary to pneumonia. Since admission her blood sugars have been controlled despite not having received any scheduled insulin here. Blood glucose has ranged 108-122. Endocrinology has been consulted for inpatient diabetes management by Dr. Sherryll Burger.  The patient has dementia and so much of the history was obtained from her husband who is also hospitalized. He reports her current regimen of insulin at home is Lantus 25 units bid and novolog 25 units tid with meals. She has not had an issue with hypoglycemia at home. She has reported symptoms concerning for hypoglycemia at times but when BG is checked it tends to be normal or a bit elevated. Her husband checks BG 3-4 times daily and keeps meticulous logs of the data.   ROS: She reports feeling poorly endorsing fatigue, palpitations, dypsnea on exertion, fevers, chills, and nausea. Remainder of the 10 pt ROS was negative.   Past Medical History  Diagnosis Date  . COPD (chronic obstructive pulmonary disease)   . Diabetes mellitus without complication   . Atrial fibrillation   . Hypertension   . Hypercholesteremia     Current facility-administered medications:  .  0.9 %  sodium chloride infusion, , Intravenous, Continuous, Vipul Shah, MD .  0.9 %  sodium chloride infusion, , Intravenous, Continuous, Delfino Lovett, MD, Last Rate: 125 mL/hr at 04/06/15 1150 .  albuterol (PROVENTIL) (2.5 MG/3ML) 0.083% nebulizer solution 2.5 mg, 2.5 mg, Nebulization, Q6H PRN, Delfino Lovett, MD .  alum & mag hydroxide-simeth (MAALOX/MYLANTA) 200-200-20 MG/5ML suspension 30 mL, 30 mL, Oral, Q6H PRN, Delfino Lovett, MD .  Melene Muller ON 04/07/2015] diltiazem (TIAZAC) 24 hr capsule 180 mg, 180 mg, Oral, Daily, Vipul Shah, MD .  fluticasone (FLONASE) 50  MCG/ACT nasal spray 1 spray, 1 spray, Each Nare, Daily, Delfino Lovett, MD, 1 spray at 04/06/15 1501 .  insulin aspart (novoLOG) injection 0-5 Units, 0-5 Units, Subcutaneous, QHS, Vipul Shah, MD .  insulin aspart (novoLOG) injection 0-9 Units, 0-9 Units, Subcutaneous, TID WC, Delfino Lovett, MD, 0 Units at 04/06/15 1336 .  insulin aspart (novoLOG) injection 25 Units, 25 Units, Subcutaneous, TID, Delfino Lovett, MD, 25 Units at 04/06/15 1225 .  meloxicam (MOBIC) tablet 7.5 mg, 7.5 mg, Oral, BID PC, Vipul Shah, MD .  mometasone-formoterol (DULERA) 100-5 MCG/ACT inhaler 2 puff, 2 puff, Inhalation, BID, Vipul Sherryll Burger, MD .  norepinephrine (LEVOPHED)  in D5W premix infusion, 0-40 mcg/min, Intravenous, Titrated, Vipul Shah, MD .  ondansetron (ZOFRAN) tablet 4 mg, 4 mg, Oral, Q6H PRN **OR** ondansetron (ZOFRAN) injection 4 mg, 4 mg, Intravenous, Q6H PRN, Delfino Lovett, MD .  piperacillin-tazobactam (ZOSYN) IVPB 3.375 g, 3.375 g, Intravenous, 3 times per day, Sharman Cheek, MD, 3.375 g at 04/06/15 1230 .  piperacillin-tazobactam (ZOSYN) IVPB 3.375 g, 3.375 g, Intravenous, Once, Delfino Lovett, MD, 3.375 g at 04/06/15 1233 .  Rivaroxaban (XARELTO) tablet 15 mg, 15 mg, Oral, Daily, Vipul Shah, MD, 15 mg at 04/06/15 1407 .  simvastatin (ZOCOR) tablet 10 mg, 10 mg, Oral, Daily, Vipul Shah, MD, 10 mg at 04/06/15 1409 .  tiotropium (SPIRIVA) inhalation capsule 18 mcg, 18 mcg, Inhalation, Daily, Delfino Lovett, MD, 18 mcg at 04/06/15 1619 .  vancomycin (VANCOCIN) 1,250 mg in sodium chloride 0.9 % 250 mL IVPB, 1,250 mg, Intravenous, Q24H, Sharman Cheek, MD, 1,250 mg at  04/06/15 1331 .  Vitamin D-3 CAPS 1,000 Units, 1,000 Units, Oral, Daily, Delfino Lovett, MD, 1,000 Units at 04/06/15 1500  Allergies  Allergen Reactions  . Hydromorphone Hcl Nausea And Vomiting  . Ibuprofen Other (See Comments)    Pt was told by her MD not to take this medication.    . Mucinex [Guaifenesin Er] Other (See Comments)    Red all over. Felt like i  was on fire  . Percocet [Oxycodone-Acetaminophen] Hives   Social history: She lives with her husband, who is her primary caretaker. Her daughter is also very involved Family history: negative for diabetes  Physical exam: Filed Vitals:   04/06/15 1300 04/06/15 1400 04/06/15 1534 04/06/15 1600  BP: 94/65 95/58 103/75 94/75  Pulse: 84 48    Temp: 100.2 F (37.9 C) 100.4 F (38 C)    TempSrc:      Resp: 25 24 20 21   SpO2: 92% 93%     Gen: obese elderly woman, no acute distress Neuro: AAOx3, able to answer most questions appropriately or defers to her husband HEENT: eyes anicteric, EOMI Neck: supple, no thyromegaly CVS: RRR, s1, s2 Pulm: clear to auscultation anteriorly Abdomen: obese, soft, non-tender to palpation Extremity: no edema, no ulcerations on either feet, normal sensation to light touch Skin: warm, dry Psych: normal affect, fair insight into her medical condition  Labs:  Component     Latest Ref Rng 03/17/2015  Hemoglobin A1C     4.0 - 6.0 % 7.2 (H)   CBC    Component Value Date/Time   WBC 12.1* 04/06/2015 0349   WBC 6.3 02/11/2014 0501   RBC 4.19 04/06/2015 0349   RBC 4.43 02/11/2014 0501   HGB 14.1 04/06/2015 0349   HGB 14.6 02/11/2014 0501   HCT 42.3 04/06/2015 0349   HCT 44.5 02/11/2014 0501   PLT 222 04/06/2015 0349   PLT 188 02/11/2014 0501   MCV 100.9* 04/06/2015 0349   MCV 100 02/11/2014 0501   MCH 33.7 04/06/2015 0349   MCH 32.9 02/11/2014 0501   MCHC 33.4 04/06/2015 0349   MCHC 32.7 02/11/2014 0501   RDW 15.1* 04/06/2015 0349   RDW 14.9* 02/11/2014 0501   LYMPHSABS 0.3* 04/06/2015 0349   LYMPHSABS 1.2 02/11/2014 0501   MONOABS 0.2 04/06/2015 0349   MONOABS 0.4 02/11/2014 0501   EOSABS 0.1 04/06/2015 0349   EOSABS 0.2 02/11/2014 0501   BASOSABS 0.0 04/06/2015 0349   BASOSABS 0.1 02/11/2014 0501   BMP Latest Ref Rng 04/06/2015 03/20/2015 03/19/2015  Glucose 65 - 99 mg/dL 161(W) 960(A) 540(J)  BUN 6 - 20 mg/dL 81(X) 91(Y) 78(G)  Creatinine  0.44 - 1.00 mg/dL 9.56(O) 1.30(Q) 6.57(Q)  Sodium 135 - 145 mmol/L 139 137 135  Potassium 3.5 - 5.1 mmol/L 5.0 5.2(H) 5.0  Chloride 101 - 111 mmol/L 105 108 106  CO2 22 - 32 mmol/L 22 23 24   Calcium 8.9 - 10.3 mg/dL 9.0 4.6(N) 6.2(X)   Assessment:  81 yo woman with controlled and uncomplicated type 2 DM admitted with septic shock thought to be secondary to pneumonia. Her insulin requirement is significantly lower likely due to sepsis and poor oral intake.   Recommendations:  Would start Lantus 25 units nightly starting at 8 pm tonight Continue sliding scale novolog for BG>200 mg/dL Check fingerstick BG AC/HS (4xdaily) Check fingerstick is hypoglycemia suspected and treat accordingly Will follow along and arrange appropriate follow up with me post-discharge Thank you for allowing me to participate in this patients care.  Doylene Canning, MD [facility]

## 2015-04-06 NOTE — Progress Notes (Signed)
Admitted with sepsis from ED.  Lactic acid 3.0.  Gram + cocci in both blood cx  bottles right arm.  On vanco and zosyn.  Temp 100.4 rectal.  Alert and oriented. Afib 90-118/min on cardizem gtt at /hr.   No levophed required. Seen by Dr Lady Gary, Dr Sampson Goon,  and seen by endocrinolgy this afternoon. Productive cough clear mucous.  Sputum cx and stool for c-diff sent.  Appetite poor.  No insulin gien toay.  Last CBG 122 before dinner. Incontinent of urine x 3

## 2015-04-06 NOTE — Progress Notes (Signed)
ANTIBIOTIC CONSULT NOTE - INITIAL  Pharmacy Consult for Zosyn/Vancomycin Indication: rule out sepsis  Allergies  Allergen Reactions  . Hydromorphone Hcl Nausea And Vomiting  . Ibuprofen Other (See Comments)    Pt was told by her MD not to take this medication.    Marland Kitchen Percocet [Oxycodone-Acetaminophen] Hives    Patient Measurements:   Adjusted Body Weight: 70.5 kg  Vital Signs:   Intake/Output from previous day:   Intake/Output from this shift:    Labs: No results for input(s): WBC, HGB, PLT, LABCREA, CREATININE in the last 72 hours. Estimated Creatinine Clearance: 35.6 mL/min (by C-G formula based on Cr of 1.45). No results for input(s): VANCOTROUGH, VANCOPEAK, VANCORANDOM, GENTTROUGH, GENTPEAK, GENTRANDOM, TOBRATROUGH, TOBRAPEAK, TOBRARND, AMIKACINPEAK, AMIKACINTROU, AMIKACIN in the last 72 hours.   Microbiology: Recent Results (from the past 720 hour(s))  MRSA PCR Screening     Status: None   Collection Time: 03/16/15 11:46 PM  Result Value Ref Range Status   MRSA by PCR NEGATIVE NEGATIVE Final    Comment:        The GeneXpert MRSA Assay (FDA approved for NASAL specimens only), is one component of a comprehensive MRSA colonization surveillance program. It is not intended to diagnose MRSA infection nor to guide or monitor treatment for MRSA infections.     Medical History: Past Medical History  Diagnosis Date  . COPD (chronic obstructive pulmonary disease)   . Diabetes mellitus without complication   . Atrial fibrillation   . Hypertension   . Hypercholesteremia     Medications:  Infusions:  . piperacillin-tazobactam    . sodium chloride 1,000 mL (04/06/15 0404)  . vancomycin 1,000 mg (04/06/15 0408)   Assessment: 80 yof tachycardic in 170s brought by EMS, consulted for vanc/Zosyn sepsis.  Vd 63.5 L, Ke 0.034 hr-1, T1/2 20.4 hr, predicted trough 15.9 mcg/mL  Goal of Therapy:  Vancomycin trough level 15-20 mcg/ml  Plan:  Expected duration 7 days  with resolution of temperature and/or normalization of WBC. Zosyn 3.375 gm IV Q8H EI, vancomycin 1.25 gm IV Q24H stacked dosing with second dose 9 hours after first. Will order trough level before fourth dose and adjust as needed to maintain trough 15 to 20 mcg/mL.  Carola Frost, Pharm.D. Clinical Pharmacist 04/06/2015,4:09 AM

## 2015-04-06 NOTE — ED Notes (Addendum)
Pt incontinent mod amount liquid brown stool; clensed, linen and attends changed; pt repos in bed for comfort; noted excoriated skin to abdominal folds & groin

## 2015-04-06 NOTE — ED Notes (Signed)
DR.Shaw notified of blood pressue 81/72, no new orders obtained

## 2015-04-07 ENCOUNTER — Inpatient Hospital Stay: Payer: Medicare Other

## 2015-04-07 DIAGNOSIS — J441 Chronic obstructive pulmonary disease with (acute) exacerbation: Secondary | ICD-10-CM

## 2015-04-07 DIAGNOSIS — J984 Other disorders of lung: Secondary | ICD-10-CM

## 2015-04-07 DIAGNOSIS — J189 Pneumonia, unspecified organism: Secondary | ICD-10-CM

## 2015-04-07 DIAGNOSIS — R5381 Other malaise: Secondary | ICD-10-CM

## 2015-04-07 DIAGNOSIS — K449 Diaphragmatic hernia without obstruction or gangrene: Secondary | ICD-10-CM

## 2015-04-07 DIAGNOSIS — E669 Obesity, unspecified: Secondary | ICD-10-CM

## 2015-04-07 DIAGNOSIS — J438 Other emphysema: Secondary | ICD-10-CM

## 2015-04-07 DIAGNOSIS — A419 Sepsis, unspecified organism: Secondary | ICD-10-CM

## 2015-04-07 LAB — CBC
HEMATOCRIT: 33.7 % — AB (ref 35.0–47.0)
Hemoglobin: 11.3 g/dL — ABNORMAL LOW (ref 12.0–16.0)
MCH: 34.1 pg — ABNORMAL HIGH (ref 26.0–34.0)
MCHC: 33.4 g/dL (ref 32.0–36.0)
MCV: 102 fL — AB (ref 80.0–100.0)
PLATELETS: 187 10*3/uL (ref 150–440)
RBC: 3.31 MIL/uL — AB (ref 3.80–5.20)
RDW: 15.9 % — AB (ref 11.5–14.5)
WBC: 11 10*3/uL (ref 3.6–11.0)

## 2015-04-07 LAB — COMPREHENSIVE METABOLIC PANEL
ALBUMIN: 2.7 g/dL — AB (ref 3.5–5.0)
ALT: 21 U/L (ref 14–54)
ANION GAP: 5 (ref 5–15)
AST: 22 U/L (ref 15–41)
Alkaline Phosphatase: 55 U/L (ref 38–126)
BUN: 26 mg/dL — ABNORMAL HIGH (ref 6–20)
CHLORIDE: 110 mmol/L (ref 101–111)
CO2: 24 mmol/L (ref 22–32)
Calcium: 7.9 mg/dL — ABNORMAL LOW (ref 8.9–10.3)
Creatinine, Ser: 1.34 mg/dL — ABNORMAL HIGH (ref 0.44–1.00)
GFR calc non Af Amer: 37 mL/min — ABNORMAL LOW (ref >60)
GFR, EST AFRICAN AMERICAN: 43 mL/min — AB (ref >60)
GLUCOSE: 104 mg/dL — AB (ref 65–99)
Potassium: 4.4 mmol/L (ref 3.5–5.1)
SODIUM: 139 mmol/L (ref 135–145)
Total Bilirubin: 0.9 mg/dL (ref 0.3–1.2)
Total Protein: 5.4 g/dL — ABNORMAL LOW (ref 6.5–8.1)

## 2015-04-07 LAB — GLUCOSE, CAPILLARY
GLUCOSE-CAPILLARY: 96 mg/dL (ref 65–99)
GLUCOSE-CAPILLARY: 200 mg/dL — AB (ref 65–99)
GLUCOSE-CAPILLARY: 168 mg/dL — AB (ref 65–99)
Glucose-Capillary: 126 mg/dL — ABNORMAL HIGH (ref 65–99)
Glucose-Capillary: 191 mg/dL — ABNORMAL HIGH (ref 65–99)

## 2015-04-07 LAB — PROTIME-INR
INR: 2.94
Prothrombin Time: 30.7 seconds — ABNORMAL HIGH (ref 11.4–15.0)

## 2015-04-07 MED ORDER — CETYLPYRIDINIUM CHLORIDE 0.05 % MT LIQD
7.0000 mL | Freq: Two times a day (BID) | OROMUCOSAL | Status: DC
  Administered 2015-04-07 – 2015-04-14 (×13): 7 mL via OROMUCOSAL

## 2015-04-07 MED ORDER — DEXTROSE 5 % IV SOLN
1.0000 g | INTRAVENOUS | Status: DC
  Administered 2015-04-08: 1 g via INTRAVENOUS
  Filled 2015-04-07 (×2): qty 10

## 2015-04-07 NOTE — Progress Notes (Signed)
Paged Dr.Shah earlier about patient's leg being swollen and red towards ankle and patient in afib. No pain in leg. MD to see patient soon and will assess leg in person. Currently RN has SCD off on that leg.

## 2015-04-07 NOTE — Progress Notes (Signed)
[facility] CLINIC INFECTIOUS DISEASE PROGRESS NOTE Date of Admission:  04/06/2015     ID: Allison Shepard is a 81 y.o. female with Group G Strep bacteremia  Active Problems:   Acute respiratory distress   Sepsis   Diaphragmatic hernia   Restrictive lung disease secondary to obesity   Pneumonia   COPD exacerbation   Other emphysema   Morbid obesity   Debility   Subjective: No further fevers, bcx with Grp G Strep   ROS  Eleven systems are reviewed and negative except per hpi  Medications:  Antibiotics Given (last 72 hours)    Date/Time Action Medication Dose Rate   04/06/15 1230 Given   piperacillin-tazobactam (ZOSYN) IVPB 3.375 g 3.375 g 12.5 mL/hr   04/06/15 1331 Given   vancomycin (VANCOCIN) 1,250 mg in sodium chloride 0.9 % 250 mL IVPB 1,250 mg 166.7 mL/hr   04/06/15 2123 Given   piperacillin-tazobactam (ZOSYN) IVPB 3.375 g 3.375 g 12.5 mL/hr   04/07/15 0342 Given   piperacillin-tazobactam (ZOSYN) IVPB 3.375 g 3.375 g 12.5 mL/hr   04/07/15 1101 Given   azithromycin (ZITHROMAX) tablet 500 mg 500 mg    04/07/15 1232 Given   piperacillin-tazobactam (ZOSYN) IVPB 3.375 g 3.375 g 12.5 mL/hr   04/07/15 1405 Given   vancomycin (VANCOCIN) 1,250 mg in sodium chloride 0.9 % 250 mL IVPB 1,250 mg 166.7 mL/hr     . [START ON 04/08/2015] azithromycin  250 mg Oral Daily  . diltiazem  180 mg Oral Daily  . fluticasone  1 spray Each Nare Daily  . insulin aspart  0-9 Units Subcutaneous TID WC  . insulin glargine  25 Units Subcutaneous QHS  . meloxicam  7.5 mg Oral BID PC  . mometasone-formoterol  2 puff Inhalation BID  . piperacillin-tazobactam (ZOSYN)  IV  3.375 g Intravenous 3 times per day  . piperacillin-tazobactam  3.375 g Intravenous Once  . Rivaroxaban  15 mg Oral Daily  . simvastatin  10 mg Oral Daily  . tiotropium  18 mcg Inhalation Daily  . vancomycin  1,250 mg Intravenous Q24H  . Vitamin D-3  1,000 Units Oral Daily    Objective: Vital signs in last 24 hours: Temp:   [97.8 F (36.6 C)-100.8 F (38.2 C)] 97.8 F (36.6 C) (08/23 1200) Pulse Rate:  [43-119] 59 (08/23 1200) Resp:  [20-32] 20 (08/23 1200) BP: (94-123)/(70-90) 106/76 mmHg (08/23 1200) SpO2:  [91 %-97 %] 94 % (08/23 1200) Weight:  [90.719 kg (200 lb)] 90.719 kg (200 lb) (08/22 1800) Constitutional: Obese, ill appearing confused HENT: [place]/AT, PERRLA, no scleral icterus Mouth/Throat: Oropharynx is clear and dry . No oropharyngeal exudate.  Cardiovascular: Tachy irr  Pulmonary/Chest: poor air movement, rhonchi Neck = supple, no nuchal rigidity Abdominal: Soft. Bowel sounds are normal. exhibits no distension. There is no tenderness.  Lymphadenopathy: no cervical adenopathy. No axillary adenopathy Neurological: confused, slow metation Skin: Skin is warm and dry. No rash noted. No erythema.  Psychiatric: a normal mood and affect.   Lab Results  Recent Labs  04/06/15 0349 04/07/15 0520  WBC 12.1* 11.0  HGB 14.1 11.3*  HCT 42.3 33.7*  NA 139 139  K 5.0 4.4  CL 105 110  CO2 22 24  BUN 36* 26*  CREATININE 1.45* 1.34*    Microbiology: @ Studies/Results: Ct Abdomen Pelvis Wo Contrast  04/06/2015   CLINICAL DATA:  Sepsis and altered mental status. Fever with hypoxia.  EXAM: CT CHEST, ABDOMEN AND PELVIS WITHOUT CONTRAST  TECHNIQUE: Multidetector CT imaging of  the chest, abdomen and pelvis was performed following the standard protocol without IV contrast.  COMPARISON:  Chest radiograph earlier this day. CT abdomen 02/15/2013  FINDINGS: CT CHEST FINDINGS  Breathing motion artifact partially limits evaluation. Dependent atelectasis at the lung bases. Ground-glass opacities in the right and left lower lobes and left upper lobe, suggesting pulmonary edema. The heart is enlarged. There are coronary artery calcifications. The thoracic aorta is normal in caliber with mild atherosclerosis. There is no pleural or pericardial effusion. No pathologically enlarged mediastinal adenopathy.  Limited assessment for hilar adenopathy given lack of contrast and motion artifact. There are no acute or suspicious osseous abnormalities.  CT ABDOMEN AND PELVIS FINDINGS  Lack of contrast limits assessment. Allowing for this, no evidence of focal hepatic lesion. The unenhanced gallbladder, spleen, adrenal glands, and pancreas are normal. No hydronephrosis. Bilateral renal cysts, incompletely characterized without contrast. Largest is in the upper left kidney.  The stomach is decompressed. There are no dilated or thickened bowel loops. Distal colonic diverticulosis without diverticulitis. The appendix is normal. No free air, free fluid, or intra-abdominal fluid collection.  No retroperitoneal adenopathy. Abdominal aorta is normal in caliber. Moderate atherosclerosis of the abdominal aorta and its branches. There is laxity of the anterior abdominal wall with fat containing ventral abdominal wall hernias.  Within the pelvis the urinary bladder is physiologically distended. The uterus is surgically absent. No gross adnexal mass, detailed pelvic evaluation partially obscured by right hip prosthesis.  Postsurgical change in the lumbar spine. Right hip prosthesis in place. There are no acute or suspicious osseous abnormalities.  IMPRESSION: 1. Dependent atelectasis within both lungs. Multifocal ground-glass opacities, suggestive of pulmonary edema. Breathing motion artifact partially limits evaluation. 2. No acute abnormality in the abdomen/pelvis. 3. Atherosclerosis.  Coronary artery calcifications.   Electronically Signed   By: Rubye Oaks M.D.   On: 04/06/2015 05:17   Ct Head Wo Contrast  04/06/2015   CLINICAL DATA:  Sepsis and altered mental status.  EXAM: CT HEAD WITHOUT CONTRAST  TECHNIQUE: Contiguous axial images were obtained from the base of the skull through the vertex without intravenous contrast.  COMPARISON:  12/08/2011  FINDINGS: No intracranial hemorrhage, mass effect, or midline shift. CSF density  space in the left middle cranial fossa likely an arachnoid cyst, unchanged. No associated mass effect. There is periventricular and deep white matter hypodensity, nonspecific, likely related to chronic small vessel ischemia. No hydrocephalus. The basilar cisterns are patent. No evidence of territorial infarct. No intracranial fluid collection. Calvarium is intact. Included paranasal sinuses are well aerated. The previous mastoid air cell opacification has improved with minimal residual opacification of lower left mastoid air cells.  IMPRESSION: No acute intracranial abnormality.   Electronically Signed   By: Rubye Oaks M.D.   On: 04/06/2015 05:21   Ct Chest Wo Contrast  04/06/2015   CLINICAL DATA:  Sepsis and altered mental status. Fever with hypoxia.  EXAM: CT CHEST, ABDOMEN AND PELVIS WITHOUT CONTRAST  TECHNIQUE: Multidetector CT imaging of the chest, abdomen and pelvis was performed following the standard protocol without IV contrast.  COMPARISON:  Chest radiograph earlier this day. CT abdomen 02/15/2013  FINDINGS: CT CHEST FINDINGS  Breathing motion artifact partially limits evaluation. Dependent atelectasis at the lung bases. Ground-glass opacities in the right and left lower lobes and left upper lobe, suggesting pulmonary edema. The heart is enlarged. There are coronary artery calcifications. The thoracic aorta is normal in caliber with mild atherosclerosis. There is no pleural or pericardial effusion. No  pathologically enlarged mediastinal adenopathy. Limited assessment for hilar adenopathy given lack of contrast and motion artifact. There are no acute or suspicious osseous abnormalities.  CT ABDOMEN AND PELVIS FINDINGS  Lack of contrast limits assessment. Allowing for this, no evidence of focal hepatic lesion. The unenhanced gallbladder, spleen, adrenal glands, and pancreas are normal. No hydronephrosis. Bilateral renal cysts, incompletely characterized without contrast. Largest is in the upper left  kidney.  The stomach is decompressed. There are no dilated or thickened bowel loops. Distal colonic diverticulosis without diverticulitis. The appendix is normal. No free air, free fluid, or intra-abdominal fluid collection.  No retroperitoneal adenopathy. Abdominal aorta is normal in caliber. Moderate atherosclerosis of the abdominal aorta and its branches. There is laxity of the anterior abdominal wall with fat containing ventral abdominal wall hernias.  Within the pelvis the urinary bladder is physiologically distended. The uterus is surgically absent. No gross adnexal mass, detailed pelvic evaluation partially obscured by right hip prosthesis.  Postsurgical change in the lumbar spine. Right hip prosthesis in place. There are no acute or suspicious osseous abnormalities.  IMPRESSION: 1. Dependent atelectasis within both lungs. Multifocal ground-glass opacities, suggestive of pulmonary edema. Breathing motion artifact partially limits evaluation. 2. No acute abnormality in the abdomen/pelvis. 3. Atherosclerosis.  Coronary artery calcifications.   Electronically Signed   By: Rubye Oaks M.D.   On: 04/06/2015 05:17   Dg Chest Port 1 View  04/07/2015   CLINICAL DATA:  Shortness of breath.  EXAM: PORTABLE CHEST - 1 VIEW  COMPARISON:  04/06/2015.  CT 04/06/2015.  FINDINGS: Mediastinum hilar structures normal. Cardiomegaly with normal pulmonary vascularity. Interim clearing of pulmonary venous congestion and interstitial prominence suggesting clear clearing congestive heart failure. Mild basilar atelectasis. Small left pleural effusion cannot be excluded.  IMPRESSION: 1. Interim clearing of pulmonary venous congestion interstitial prominence suggesting clearing congestive heart failure. Small left pleural effusion may be present. 2. Low lung volumes with mild bibasilar atelectasis.   Electronically Signed   By: Maisie Fus  Register   On: 04/07/2015 07:57   Dg Chest Port 1 View  04/06/2015   CLINICAL DATA:   Altered mental status, tachycardia.  EXAM: PORTABLE CHEST - 1 VIEW  COMPARISON:  03/27/2015  FINDINGS: Lung volumes remain low. Cardiomediastinal contours are unchanged with cardiomegaly. Vascular congestion is again seen. There is new perivascular haziness suggestive of pulmonary edema. Limited lung base assessment due to soft tissue attenuation from body habitus. No pneumothorax. Degenerative change seen in both shoulders.  IMPRESSION: Development perivascular haziness suggesting pulmonary edema. Cardiomegaly and vascular congestion are again seen.   Electronically Signed   By: Rubye Oaks M.D.   On: 04/06/2015 04:22    Assessment/Plan: RAYLIN DIGUGLIELMO is a 81 y.o. female with COPD, DM admitted with A fib with RVR and fever to 104. CT chest shows possible ground glass opacities vs pulmonary edema.  CT abd pelvis neg. BCx + Grp G strep.  C diff negative. UA unimpressive. FU CXR shows improvement Of note she did have recent admission and also recent steroid injection in spine . Currently much improved. Only cx + is for the Grp G strep Recommendations Change abx to just ceftriaxone. Adjust further based on sensitivities Thank you very much for the consult. Will follow with you.  Annaya Bangert   04/07/2015, 2:51 PM

## 2015-04-07 NOTE — Consult Note (Signed)
[facility] Consultation      Assessment and Plan: Acute respiratory distress --Likely due to pneumonia.  Restrictive lung disease secondary to obesity -Weight loss would be beneficial.  Pneumonia Currently on adequate antibiotic coverage, infectious disease services, following  COPD exacerbation Uncertain if this represents a true COPD exacerbation, the patient is not currently on steroids. She appears to be improved. --Will need outpatient pulmonary function testing and follow-up with Dr. Meredeth Ide.  Sepsis -Now appears to be doing better. Continue antibiotics.  Morbid obesity -Weight loss would be beneficial to her breathing.  Diaphragmatic hernia -Likely contributing to her dyspnea and restrictive lung disease.  Debility -With deconditioning. Would recommend increased physical activity. May benefit from physical therapy and/or pulmonary rehabilitation.    Okay to transfer to floor from respiratory standpoint.  Date: 04/07/2015  MRN# [id number] Allison Shepard 08-Jun-1934  Referring Physician: Dr. Tiburcio Pea Allison Shepard is a 81 y.o. old female seen in consultation for chief complaint of:    Chief Complaint  Patient presents with  . Altered Mental Status     HPI:   The patient is a 81 year old Caucasian female with history for COPD. She was brought into the hospital early Monday morning as her family found her with severe shaking chills and altered mental status. EMS was called and they noted thatthe patient was quite altered mentally. She was also tachycardic with a heart rate of 160-180. The patient was brought to the ER and was noted that she had a room air saturation of 79%. She had a temperature as high as up to 104.5. Subsequent she was admitted to the hospital. The patient notes that she did have a cough for the past few days with increasing shortness of breath The patient is awake and alert but she is still somewhat confused about  the circumstances of her admission. History is obtained from the patient. The chart as well as the patient's daughter at bedside. They tell me that the patient has a history of COPD she was seen a few years ago by Dr. Meredeth Ide at that time it was thought that she was stable. She was told that she could follow up on an as-needed basis. She tells me that she has been using an oral inhaler once daily and a rescue inhaler as needed. She has not required her rescue inhaler recently. She notes that her exertional dyspnea has become progressively worse over the last 1 year, she can drive a car and walk into a pharmacy be winded at that time. She has no other particular complaints at this time.  I reviewed the CT of the chest imaging and radiology report as well as the chest x-ray imaging and report from 04/07/2015; this shows significant lung restriction due to body habitus. There is a right-sided diaphragmatic hernia. There is bibasilar atelectasis and there are areas of possibly multifocal air disease versus atelectasis.  PMHX:   Past Medical History  Diagnosis Date  . COPD (chronic obstructive pulmonary disease)   . Diabetes mellitus without complication   . Atrial fibrillation   . Hypertension   . Hypercholesteremia    Surgical Hx:  Past Surgical History  Procedure Laterality Date  . Abdominal hysterectomy    . Shoulder fusion surgery Right   . Knee surgery Right   . Hip surgery Right   . Back surgery     Family Hx:  No family history on file. Social Hx:   Social History  Substance Use Topics  .  Smoking status: Never Smoker   . Smokeless tobacco: None  . Alcohol Use: 0.6 oz/week    0 Standard drinks or equivalent, 1 Glasses of wine per week     Comment: daily   Medication:   No current outpatient prescriptions on file.    Allergies:  Hydromorphone hcl; Ibuprofen; Mucinex; and Percocet  Review of Systems: Gen:  Denies  fever, sweats, chills HEENT: Denies blurred vision, double  vision. bleeds, sore throat Cvc:  No dizziness, chest pain. Resp:   Denies cough or sputum porduction, shortness of breath Gi: Denies swallowing difficulty, stomach pain. Gu:  Denies bladder incontinence, burning urine Ext:   No Joint pain, stiffness. Skin: No skin rash,  hives Endoc:  No polyuria, polydipsia. Psych: No depression, insomnia. Other:  All other systems were reviewed with the patient and were negative other that what is mentioned in the HPI.   Physical Examination:   VS: BP 108/86 mmHg  Pulse 116  Temp(Src) 97.8 F (36.6 C) (Oral)  Resp 20  Ht 5\' 5"  (1.651 m)  Wt 90.719 kg (200 lb)  BMI 33.28 kg/m2  SpO2 95%  General Appearance: No distress  Neuro:without focal findings,  speech normal,  HEENT: PERRLA, EOM intact.   Pulmonary: normal breath sounds, No wheezing.  CardiovascularNormal S1,S2.  No m/r/g.   Abdomen: Benign, Soft, non-tender. Renal:  No costovertebral tenderness  GU:  No performed at this time. Endoc: No evident thyromegaly, no signs of acromegaly. Skin:   warm, no rashes, no ecchymosis  Extremities: normal, no cyanosis, clubbing.  Other findings:    LABORATORY PANEL:   CBC  Recent Labs Lab 04/07/15 0520  WBC 11.0  HGB 11.3*  HCT 33.7*  PLT 187   ------------------------------------------------------------------------------------------------------------------  Chemistries   Recent Labs Lab 04/07/15 0520  NA 139  K 4.4  CL 110  CO2 24  GLUCOSE 104*  BUN 26*  CREATININE 1.34*  CALCIUM 7.9*  AST 22  ALT 21  ALKPHOS 55  BILITOT 0.9   ------------------------------------------------------------------------------------------------------------------  Cardiac Enzymes  Recent Labs Lab 04/06/15 0349  TROPONINI <0.03   ------------------------------------------------------------  RADIOLOGY:  Ct Abdomen Pelvis Wo Contrast  04/06/2015   CLINICAL DATA:  Sepsis and altered mental status. Fever with hypoxia.  EXAM: CT CHEST,  ABDOMEN AND PELVIS WITHOUT CONTRAST  TECHNIQUE: Multidetector CT imaging of the chest, abdomen and pelvis was performed following the standard protocol without IV contrast.  COMPARISON:  Chest radiograph earlier this day. CT abdomen 02/15/2013  FINDINGS: CT CHEST FINDINGS  Breathing motion artifact partially limits evaluation. Dependent atelectasis at the lung bases. Ground-glass opacities in the right and left lower lobes and left upper lobe, suggesting pulmonary edema. The heart is enlarged. There are coronary artery calcifications. The thoracic aorta is normal in caliber with mild atherosclerosis. There is no pleural or pericardial effusion. No pathologically enlarged mediastinal adenopathy. Limited assessment for hilar adenopathy given lack of contrast and motion artifact. There are no acute or suspicious osseous abnormalities.  CT ABDOMEN AND PELVIS FINDINGS  Lack of contrast limits assessment. Allowing for this, no evidence of focal hepatic lesion. The unenhanced gallbladder, spleen, adrenal glands, and pancreas are normal. No hydronephrosis. Bilateral renal cysts, incompletely characterized without contrast. Largest is in the upper left kidney.  The stomach is decompressed. There are no dilated or thickened bowel loops. Distal colonic diverticulosis without diverticulitis. The appendix is normal. No free air, free fluid, or intra-abdominal fluid collection.  No retroperitoneal adenopathy. Abdominal aorta is normal in caliber. Moderate atherosclerosis  of the abdominal aorta and its branches. There is laxity of the anterior abdominal wall with fat containing ventral abdominal wall hernias.  Within the pelvis the urinary bladder is physiologically distended. The uterus is surgically absent. No gross adnexal mass, detailed pelvic evaluation partially obscured by right hip prosthesis.  Postsurgical change in the lumbar spine. Right hip prosthesis in place. There are no acute or suspicious osseous abnormalities.   IMPRESSION: 1. Dependent atelectasis within both lungs. Multifocal ground-glass opacities, suggestive of pulmonary edema. Breathing motion artifact partially limits evaluation. 2. No acute abnormality in the abdomen/pelvis. 3. Atherosclerosis.  Coronary artery calcifications.   Electronically Signed   By: Rubye Oaks M.D.   On: 04/06/2015 05:17   Ct Head Wo Contrast  04/06/2015   CLINICAL DATA:  Sepsis and altered mental status.  EXAM: CT HEAD WITHOUT CONTRAST  TECHNIQUE: Contiguous axial images were obtained from the base of the skull through the vertex without intravenous contrast.  COMPARISON:  12/08/2011  FINDINGS: No intracranial hemorrhage, mass effect, or midline shift. CSF density space in the left middle cranial fossa likely an arachnoid cyst, unchanged. No associated mass effect. There is periventricular and deep white matter hypodensity, nonspecific, likely related to chronic small vessel ischemia. No hydrocephalus. The basilar cisterns are patent. No evidence of territorial infarct. No intracranial fluid collection. Calvarium is intact. Included paranasal sinuses are well aerated. The previous mastoid air cell opacification has improved with minimal residual opacification of lower left mastoid air cells.  IMPRESSION: No acute intracranial abnormality.   Electronically Signed   By: Rubye Oaks M.D.   On: 04/06/2015 05:21   Ct Chest Wo Contrast  04/06/2015   CLINICAL DATA:  Sepsis and altered mental status. Fever with hypoxia.  EXAM: CT CHEST, ABDOMEN AND PELVIS WITHOUT CONTRAST  TECHNIQUE: Multidetector CT imaging of the chest, abdomen and pelvis was performed following the standard protocol without IV contrast.  COMPARISON:  Chest radiograph earlier this day. CT abdomen 02/15/2013  FINDINGS: CT CHEST FINDINGS  Breathing motion artifact partially limits evaluation. Dependent atelectasis at the lung bases. Ground-glass opacities in the right and left lower lobes and left upper lobe,  suggesting pulmonary edema. The heart is enlarged. There are coronary artery calcifications. The thoracic aorta is normal in caliber with mild atherosclerosis. There is no pleural or pericardial effusion. No pathologically enlarged mediastinal adenopathy. Limited assessment for hilar adenopathy given lack of contrast and motion artifact. There are no acute or suspicious osseous abnormalities.  CT ABDOMEN AND PELVIS FINDINGS  Lack of contrast limits assessment. Allowing for this, no evidence of focal hepatic lesion. The unenhanced gallbladder, spleen, adrenal glands, and pancreas are normal. No hydronephrosis. Bilateral renal cysts, incompletely characterized without contrast. Largest is in the upper left kidney.  The stomach is decompressed. There are no dilated or thickened bowel loops. Distal colonic diverticulosis without diverticulitis. The appendix is normal. No free air, free fluid, or intra-abdominal fluid collection.  No retroperitoneal adenopathy. Abdominal aorta is normal in caliber. Moderate atherosclerosis of the abdominal aorta and its branches. There is laxity of the anterior abdominal wall with fat containing ventral abdominal wall hernias.  Within the pelvis the urinary bladder is physiologically distended. The uterus is surgically absent. No gross adnexal mass, detailed pelvic evaluation partially obscured by right hip prosthesis.  Postsurgical change in the lumbar spine. Right hip prosthesis in place. There are no acute or suspicious osseous abnormalities.  IMPRESSION: 1. Dependent atelectasis within both lungs. Multifocal ground-glass opacities, suggestive of pulmonary edema. Breathing  motion artifact partially limits evaluation. 2. No acute abnormality in the abdomen/pelvis. 3. Atherosclerosis.  Coronary artery calcifications.   Electronically Signed   By: Rubye Oaks M.D.   On: 04/06/2015 05:17   Dg Chest Port 1 View  04/07/2015   CLINICAL DATA:  Shortness of breath.  EXAM: PORTABLE  CHEST - 1 VIEW  COMPARISON:  04/06/2015.  CT 04/06/2015.  FINDINGS: Mediastinum hilar structures normal. Cardiomegaly with normal pulmonary vascularity. Interim clearing of pulmonary venous congestion and interstitial prominence suggesting clear clearing congestive heart failure. Mild basilar atelectasis. Small left pleural effusion cannot be excluded.  IMPRESSION: 1. Interim clearing of pulmonary venous congestion interstitial prominence suggesting clearing congestive heart failure. Small left pleural effusion may be present. 2. Low lung volumes with mild bibasilar atelectasis.   Electronically Signed   By: Maisie Fus  Register   On: 04/07/2015 07:57   Dg Chest Port 1 View  04/06/2015   CLINICAL DATA:  Altered mental status, tachycardia.  EXAM: PORTABLE CHEST - 1 VIEW  COMPARISON:  03/27/2015  FINDINGS: Lung volumes remain low. Cardiomediastinal contours are unchanged with cardiomegaly. Vascular congestion is again seen. There is new perivascular haziness suggestive of pulmonary edema. Limited lung base assessment due to soft tissue attenuation from body habitus. No pneumothorax. Degenerative change seen in both shoulders.  IMPRESSION: Development perivascular haziness suggesting pulmonary edema. Cardiomegaly and vascular congestion are again seen.   Electronically Signed   By: Rubye Oaks M.D.   On: 04/06/2015 04:22     Thank  you for the consultation and for allowing [facility] to assist in the care of your patient. Our recommendations are noted above.  Please contact us if we can be of further service.   Wells Guiles, MD.  Board Certified in Internal Medicine, Pulmonary Medicine, Critical Care Medicine, and Sleep Medicine.  [facility] Office Number: [phone]  Santiago Glad, M.D.  Stephanie Acre, M.D.  Carolyne Fiscal, M.D

## 2015-04-07 NOTE — Assessment & Plan Note (Signed)
Currently on adequate antibiotic coverage, infectious disease services, following

## 2015-04-07 NOTE — Assessment & Plan Note (Signed)
-  Weight loss would be beneficial to her breathing.

## 2015-04-07 NOTE — Progress Notes (Signed)
Endocrinology inpatient follow up  Consult for: inpatient diabetes management  HPI: Allison Shepard is a 81 y.o. female with a known history of COPD and type 2 diabetes diagnosed 03/2014 who was admitted for septic shock thought to be secondary to pneumonia.  Endocrinology has been consulted for inpatient diabetes management by Dr. Sherryll Burger.  The patient has dementia and so much of the history was obtained from her husband who is also hospitalized. He reports her current regimen of insulin at home is Lantus 25 units bid and novolog 25 units tid with meals. She has not had an issue with hypoglycemia at home. She has reported symptoms concerning for hypoglycemia at times but when BG is checked it tends to be normal or a bit elevated. Her husband checks BG 3-4 times daily and keeps meticulous logs of the data.   Interim history: Pt was seen early this am and reported feeling better at the time, cough improved along with shortness of breath. Also denied any fever or chills. Appetite was improving. Overnight, she had no hypoglycemia or hyperglycemia.    Current facility-administered medications:  .  0.9 %  sodium chloride infusion, , Intravenous, Continuous, Delfino Lovett, MD, Last Rate: 50 mL/hr at 04/07/15 1509 .  albuterol (PROVENTIL) (2.5 MG/3ML) 0.083% nebulizer solution 2.5 mg, 2.5 mg, Nebulization, Q6H PRN, Delfino Lovett, MD .  alum & mag hydroxide-simeth (MAALOX/MYLANTA) 200-200-20 MG/5ML suspension 30 mL, 30 mL, Oral, Q6H PRN, Delfino Lovett, MD .  Melene Muller ON 04/08/2015] cefTRIAXone (ROCEPHIN) 1 g in dextrose 5 % 50 mL IVPB, 1 g, Intravenous, Q24H, Clydie Braun, MD .  diltiazem [facility]) 24 hr capsule 180 mg, 180 mg, Oral, Daily, Delfino Lovett, MD, 180 mg at 04/07/15 1110 .  fluticasone (FLONASE) 50 MCG/ACT nasal spray 1 spray, 1 spray, Each Nare, Daily, Delfino Lovett, MD, 1 spray at 04/07/15 1102 .  insulin aspart (novoLOG) injection 0-9 Units, 0-9 Units, Subcutaneous, TID WC, Delfino Lovett, MD, 2 Units at  04/07/15 1232 .  insulin glargine (LANTUS) injection 25 Units, 25 Units, Subcutaneous, QHS, Tephanie Escorcia Lanetta Inch, MD, 25 Units at 04/06/15 2123 .  meloxicam (MOBIC) tablet 7.5 mg, 7.5 mg, Oral, BID PC, Delfino Lovett, MD, 7.5 mg at 04/06/15 2123 .  mometasone-formoterol (DULERA) 100-5 MCG/ACT inhaler 2 puff, 2 puff, Inhalation, BID, Delfino Lovett, MD, 2 puff at 04/07/15 [phone] .  norepinephrine (LEVOPHED) 4mg  in D5W premix infusion, 0-40 mcg/min, Intravenous, Titrated, Vipul Shah, MD .  ondansetron (ZOFRAN) tablet 4 mg, 4 mg, Oral, Q6H PRN **OR** ondansetron (ZOFRAN) injection 4 mg, 4 mg, Intravenous, Q6H PRN, Delfino Lovett, MD .  Rivaroxaban (XARELTO) tablet 15 mg, 15 mg, Oral, Daily, Vipul Shah, MD, 15 mg at 04/07/15 1101 .  simvastatin (ZOCOR) tablet 10 mg, 10 mg, Oral, Daily, Vipul Shah, MD, 10 mg at 04/07/15 1102 .  tiotropium (SPIRIVA) inhalation capsule 18 mcg, 18 mcg, Inhalation, Daily, Delfino Lovett, MD, 18 mcg at 04/07/15 9604 .  Vitamin D-3 CAPS 1,000 Units, 1,000 Units, Oral, Daily, Vipul Shah, MD, 1,000 Units at 04/07/15 1102  Filed Vitals:   04/07/15 1000 04/07/15 1059 04/07/15 1100 04/07/15 1200  BP: 98/70  121/74 106/76  Pulse: 111 116 104 59  Temp:    97.8 F (36.6 C)  TempSrc:    Oral  Resp: 20  21 20   Height:      Weight:      SpO2: 97% 95% 97% 94%   Gen: obese elderly woman, no acute distress Neuro: AAOx3, able to answer questions  appropriately HEENT: eyes anicteric, EOMI CVS: tachycardic, irregular, s1, s2 Pulm: clear to auscultation anteriorly Abdomen: obese, soft, non-tender to palpation Extremity: no edema, no ulcerations on either feet, normal sensation to light touch Skin: warm, dry Psych: normal affect, fair insight into her medical condition  Labs: Component     Latest Ref Rng 04/06/2015 04/06/2015 04/06/2015 04/07/2015 04/07/2015        12:08 PM  4:18 PM  9:51 PM  7:23 AM 10:48 AM  Glucose-Capillary     65 - 99 mg/dL 409 (H) 811 (H) 95 96 914 (H)   Component      Latest Ref Rng 04/07/2015 04/07/2015        12:22 PM  4:09 PM  Glucose-Capillary     65 - 99 mg/dL 782 (H) 956 (H)    Assessment:  81 yo woman with controlled and uncomplicated type 2 DM admitted with septic shock secondary to pneumonia.   Recommendations:  Continue Lantus 25 units nightly starting at 8 pm tonight Continue sliding scale novolog 2 units for ever 50 BG>200 mg/dL May schedule novolog starting tomorrow, 2 units with each meal if food intake improves Check fingerstick BG AC/HS (4xdaily) Please alert Gwynneth Macleod of anticipated discharge date at [phone] to schedule follow up with me. Thank you for allowing me to participate in this patients care.   Doylene Canning, MD [facility] Endocrinology

## 2015-04-07 NOTE — Assessment & Plan Note (Addendum)
--  Likely due to pneumonia.

## 2015-04-07 NOTE — Assessment & Plan Note (Signed)
Weight loss would be beneficial. 

## 2015-04-07 NOTE — Assessment & Plan Note (Signed)
-  Likely contributing to her dyspnea and restrictive lung disease.

## 2015-04-07 NOTE — Progress Notes (Signed)
ANTIBIOTIC CONSULT NOTE - INITIAL  Pharmacy Consult for Zosyn/Vancomycin Indication: rule out sepsis  Allergies  Allergen Reactions  . Hydromorphone Hcl Nausea And Vomiting  . Ibuprofen Other (See Comments)    Pt was told by her MD not to take this medication.    . Mucinex [Guaifenesin Er] Other (See Comments)    Red all over. Felt like i was on fire  . Percocet [Oxycodone-Acetaminophen] Hives    Patient Measurements: Height:  (165.1 cm) Weight: 200 lb (90.719 kg) IBW/kg (Calculated) : 57 Adjusted Body Weight: 70.5 kg  Vital Signs: Temp: 97.8 F (36.6 C) (08/23 1200) Temp Source: Oral (08/23 1200) BP: 106/76 mmHg (08/23 1200) Pulse Rate: 59 (08/23 1200) Intake/Output from previous day: 08/22 0701 - 08/23 0700 In: 3055.5 [P.O.:240; I.V.:2515.5; IV Piggyback:300] Out: -  Intake/Output from this shift: Total I/O In: 961.8 [I.V.:811.8; IV Piggyback:150] Out: -   Labs:  Recent Labs  04/06/15 0349 04/07/15 0520  WBC 12.1* 11.0  HGB 14.1 11.3*  PLT 222 187  CREATININE 1.45* 1.34*   Estimated Creatinine Clearance: 38.5 mL/min (by C-G formula based on Cr of 1.34). No results for input(s): VANCOTROUGH, VANCOPEAK, VANCORANDOM, GENTTROUGH, GENTPEAK, GENTRANDOM, TOBRATROUGH, TOBRAPEAK, TOBRARND, AMIKACINPEAK, AMIKACINTROU, AMIKACIN in the last 72 hours.   Microbiology: Recent Results (from the past 720 hour(s))  MRSA PCR Screening     Status: None   Collection Time: 03/16/15 11:46 PM  Result Value Ref Range Status   MRSA by PCR NEGATIVE NEGATIVE Final    Comment:        The GeneXpert MRSA Assay (FDA approved for NASAL specimens only), is one component of a comprehensive MRSA colonization surveillance program. It is not intended to diagnose MRSA infection nor to guide or monitor treatment for MRSA infections.   Blood Culture (routine x 2)     Status: None (Preliminary result)   Collection Time: 04/06/15  3:49 AM  Result Value Ref Range Status   Specimen  Description BLOOD RIGHT WRIST  Final   Special Requests BOTTLES DRAWN AEROBIC AND ANAEROBIC 4CC  Final   Culture  Setup Time   Final    GRAM POSITIVE COCCI IN BOTH AEROBIC AND ANAEROBIC BOTTLES CRITICAL RESULT CALLED TO, READ BACK BY AND VERIFIED WITH: PAM MYERS ON 04/06/15 AT 1730 BY TB CONFIRMED BY TB/CAF    Culture   Final    STREPTOCOCCUS GROUP G IN BOTH AEROBIC AND ANAEROBIC BOTTLES SUSCEPTIBILITIES TO FOLLOW    Report Status PENDING  Incomplete  Blood Culture (routine x 2)     Status: None (Preliminary result)   Collection Time: 04/06/15  3:49 AM  Result Value Ref Range Status   Specimen Description BLOOD RIGHT ASSIST CONTROL  Final   Special Requests BOTTLES DRAWN AEROBIC AND ANAEROBIC 5CC  Final   Culture  Setup Time   Final    GRAM POSITIVE COCCI IN BOTH AEROBIC AND ANAEROBIC BOTTLES CRITICAL VALUE NOTED.  VALUE IS CONSISTENT WITH PREVIOUSLY REPORTED AND CALLED VALUE.    Culture   Final    STREPTOCOCCUS GROUP G IN BOTH AEROBIC AND ANAEROBIC BOTTLES SUSCEPTIBILITIES TO FOLLOW    Report Status PENDING  Incomplete  Urine culture     Status: None (Preliminary result)   Collection Time: 04/06/15  3:49 AM  Result Value Ref Range Status   Specimen Description URINE, RANDOM  Final   Special Requests NONE  Final   Culture NO GROWTH < 24 HOURS  Final   Report Status PENDING  Incomplete  MRSA PCR Screening     Status: None   Collection Time: 04/06/15 12:15 PM  Result Value Ref Range Status   MRSA by PCR NEGATIVE NEGATIVE Final    Comment:        The GeneXpert MRSA Assay (FDA approved for NASAL specimens only), is one component of a comprehensive MRSA colonization surveillance program. It is not intended to diagnose MRSA infection nor to guide or monitor treatment for MRSA infections.   C difficile quick scan w PCR reflex     Status: None   Collection Time: 04/06/15  3:40 PM  Result Value Ref Range Status   C Diff antigen NEGATIVE NEGATIVE Final   C Diff toxin  NEGATIVE NEGATIVE Final   C Diff interpretation Negative for C. difficile  Final  Culture, sputum-assessment     Status: None   Collection Time: 04/06/15  3:45 PM  Result Value Ref Range Status   Specimen Description SPUTUM  Final   Special Requests NONE  Final   Sputum evaluation   Final    Sputum specimen not acceptable for testing.  Please recollect.     Report Status 04/06/2015 FINAL  Final    Medical History: Past Medical History  Diagnosis Date  . COPD (chronic obstructive pulmonary disease)   . Diabetes mellitus without complication   . Atrial fibrillation   . Hypertension   . Hypercholesteremia     Medications:  Infusions:  . sodium chloride    . sodium chloride 125 mL/hr at 04/07/15 0350  . diltiazem (CARDIZEM) infusion Stopped (04/07/15 1200)  . norepinephrine     Assessment: 80 yof tachycardic in 170s brought by EMS, consulted for vanc/Zosyn sepsis.  Vd 63.5 L, Ke 0.034 hr-1, T1/2 20.4 hr, predicted trough 15.9 mcg/mL.   Goal of Therapy:  Vancomycin trough level 15-20 mcg/ml  Plan:  Expected duration 7 days with resolution of temperature and/or normalization of WBC. Continue vancomycin and Zosyn as ordered. Trough scheduled  before fourth dose.   [name], PharmD

## 2015-04-07 NOTE — Assessment & Plan Note (Signed)
-  With deconditioning. Would recommend increased physical activity. May benefit from physical therapy and/or pulmonary rehabilitation.

## 2015-04-07 NOTE — Evaluation (Signed)
Physical Therapy Evaluation Patient Details Name: KAZUE CERRO MRN: [id number] DOB: 02/08/1934 Today's Date: 04/07/2015   History of Present Illness  Pt admitted by daughter with altered mental status, EMS found her to be tachycardic with HR between 160-180 and O2 sats as low as 79%, febrile. Pt was diagnosed with septic shock.      Clinical Impression  Pt was pleasant and compliant with PT session.  At resting, pt HR measured at 116 and increased to 130 supine therex/sitting transfer.  Pt was able to complete a standing transfer and then walk to the [facility] to transfer, requires min assist with RW, HR increase up to 143 so PT deemed it unsafe to complete any additional therex and the patient returned to her bed. Pt would continue to benefit from skilled PT in order to improve cardiorespiratory endurance, improve with transfers, and ambulate for further distances.          Follow Up Recommendations SNF    Equipment Recommendations  Rolling walker with 5" wheels    Recommendations for Other Services       Precautions / Restrictions Precautions Precautions: Fall Restrictions Weight Bearing Restrictions: No      Mobility  Bed Mobility Overal bed mobility: Needs Assistance Bed Mobility: Supine to Sit     Supine to sit: Min assist     General bed mobility comments: Pt was able to sit up with min assist/tactile facilitation to comensate for lack of trunk flexion, pt aslo used UE suppot on bedrails.   Transfers Overall transfer level: Needs assistance Equipment used: Rolling walker (2 wheeled) Transfers: Sit to/from Stand Sit to Stand: Min assist         General transfer comment: Pt was able to stand using a RW with no signs of difficulty.  However, PT deemed that standing too much wasn't safe secondary to increased HR (~130-140bpm) so we returned to the bed after the [facility] transfer.   Ambulation/Gait   Ambulation Distance (Feet): 5 Feet Assistive device: Rolling walker  (2 wheeled)    Min Assist Gait velocity interpretation: <1.8 ft/sec, indicative of risk for recurrent falls    Stairs            Wheelchair Mobility    Modified Rankin (Stroke Patients Only)       Balance Overall balance assessment: Needs assistance Sitting-balance support: Single extremity supported;No upper extremity supported Sitting balance-Leahy Scale: Good Sitting balance - Comments: Pt was able to sit unsupported at EOB with no difficulty and was able to sit with no UE support.    Standing balance support: Bilateral upper extremity supported;Single extremity supported Standing balance-Leahy Scale: Fair Standing balance comment: Pt was able to stand with RW and would intermittently using one hand for support while transferring to the [facility], requires min assist for safety purposes.                              Pertinent Vitals/Pain Pain Assessment: No/denies pain    Home Living Family/patient expects to be discharged to:: Private residence Living Arrangements: Spouse/significant other;Children Available Help at Discharge: Family;Available 24 hours/day Type of Home: House Home Access: Stairs to enter Entrance Stairs-Rails: Left Entrance Stairs-Number of Steps: 2 Home Layout: One level Home Equipment: Walker - 2 wheels;Cane - single point;Bedside commode;Shower seat      Prior Function Level of Independence: Independent with assistive device(s)         Comments: RW since the  previous visit to the hospital, but prior to that no device.      Hand Dominance        Extremity/Trunk Assessment   Upper Extremity Assessment: Overall WFL for tasks assessed (Pt was able to reach to bedrails and use UE for bed mobility. Pt was able to use bilat UE for comensation on RW with standing. )           Lower Extremity Assessment: Generalized weakness      Cervical / Trunk Assessment: Normal  Communication   Communication: No difficulties   Cognition Arousal/Alertness: Awake/alert Behavior During Therapy: WFL for tasks assessed/performed Overall Cognitive Status: Within Functional Limits for tasks assessed                      General Comments      Exercises Other Exercises Other Exercises: Transfer from EOB to [facility] with min assist using a RW.  Pt was able to stand with no difficulty but HR was monitored and PT deemed additional transfers were unsafe secondary to increased HR, returned to EOB. Supine bilat heel slides and ankle pumps, 1 x 20. (~20 min)       Assessment/Plan    PT Assessment Patient needs continued PT services  PT Diagnosis Difficulty walking   PT Problem List Decreased strength;Decreased range of motion;Decreased activity tolerance;Decreased balance;Decreased mobility;Decreased coordination;Decreased knowledge of use of DME;Cardiopulmonary status limiting activity;Decreased skin integrity  PT Treatment Interventions DME instruction;Gait training;Functional mobility training;Therapeutic activities;Therapeutic exercise;Balance training;Neuromuscular re-education;Patient/family education;Stair training   PT Goals (Current goals can be found in the Care Plan section) Acute Rehab PT Goals Patient Stated Goal: to return home PT Goal Formulation: With patient Time For Goal Achievement: 04/21/15 Potential to Achieve Goals: Good    Frequency Min 2X/week   Barriers to discharge Inaccessible home environment      Co-evaluation               End of Session Equipment Utilized During Treatment: Gait belt Activity Tolerance: Patient tolerated treatment well;Treatment limited secondary to medical complications (Comment) Patient left: in bed;with call bell/phone within reach;with bed alarm set;with nursing/sitter in room Nurse Communication: Mobility status         Time: 0981-1914 PT Time Calculation (min) (ACUTE ONLY): 43 min   Charges:         PT G Codes:        Angie Fava,  SPT 04/07/2015 11:15 AM

## 2015-04-07 NOTE — Assessment & Plan Note (Signed)
-  Now appears to be doing better. Continue antibiotics.

## 2015-04-07 NOTE — Progress Notes (Signed)
[facility] - [place] [facility]   PATIENT NAME: Allison Shepard    MR#:  [id number]  DATE OF BIRTH:  [birth year]  SUBJECTIVE:  CHIEF COMPLAINT:   Chief Complaint  Patient presents with  . Altered Mental Status  doing much better, daughter at bedside.  Much alert. REVIEW OF SYSTEMS:  Review of Systems  Constitutional: Negative for fever, weight loss, malaise/fatigue and diaphoresis.  HENT: Negative for ear discharge, ear pain, hearing loss, nosebleeds, sore throat and tinnitus.   Eyes: Negative for blurred vision and pain.  Respiratory: Negative for cough, hemoptysis, shortness of breath and wheezing.   Cardiovascular: Positive for leg swelling. Negative for chest pain, palpitations and orthopnea.  Gastrointestinal: Negative for heartburn, nausea, vomiting, abdominal pain, diarrhea, constipation and blood in stool.  Genitourinary: Negative for dysuria, urgency and frequency.  Musculoskeletal: Negative for myalgias and back pain.  Skin: Negative for itching and rash.  Neurological: Negative for dizziness, tingling, tremors, focal weakness, seizures, weakness and headaches.  Psychiatric/Behavioral: Negative for depression. The patient is not nervous/anxious.    DRUG ALLERGIES:   Allergies  Allergen Reactions  . Hydromorphone Hcl Nausea And Vomiting  . Ibuprofen Other (See Comments)    Pt was told by her MD not to take this medication.    . Mucinex [Guaifenesin Er] Other (See Comments)    Red all over. Felt like i was on fire  . Percocet [Oxycodone-Acetaminophen] Hives   VITALS:  Blood pressure 106/76, pulse 59, temperature 97.8 F (36.6 C), temperature source Oral, resp. rate 20, height  (1.651 m), weight 90.719 kg (200 lb), SpO2 94 %. PHYSICAL EXAMINATION:  Physical Exam  Constitutional: She is oriented to person, place, and time and well-developed, well-nourished, and in no distress.  HENT:  Head: Normocephalic and atraumatic.  Eyes:  Conjunctivae and EOM are normal. Pupils are equal, round, and reactive to light.  Neck: Normal range of motion. Neck supple. No tracheal deviation present. No thyromegaly present.  Cardiovascular: Normal rate, regular rhythm and normal heart sounds.   Pulmonary/Chest: Effort normal and breath sounds normal. No respiratory distress. She has no wheezes. She exhibits no tenderness.  Abdominal: Soft. Bowel sounds are normal. She exhibits no distension. There is no tenderness.  Musculoskeletal: Normal range of motion. She exhibits edema.  Neurological: She is alert and oriented to person, place, and time. No cranial nerve deficit.  Skin: Skin is warm and dry. No rash noted. There is erythema (left lower extremity with small open wound.  No signs of infection).  Psychiatric: Mood and affect normal.   LABORATORY PANEL:   CBC  Recent Labs Lab 04/07/15 0520  WBC 11.0  HGB 11.3*  HCT 33.7*  PLT 187   ------------------------------------------------------------------------------------------------------------------ Chemistries   Recent Labs Lab 04/07/15 0520  NA 139  K 4.4  CL 110  CO2 24  GLUCOSE 104*  BUN 26*  CREATININE 1.34*  CALCIUM 7.9*  AST 22  ALT 21  ALKPHOS 55  BILITOT 0.9   RADIOLOGY:  US Venous Img Lower Unilateral Left  04/07/2015   CLINICAL DATA:  Left leg swelling and pain  EXAM: Left LOWER EXTREMITY VENOUS DOPPLER ULTRASOUND  TECHNIQUE: Gray-scale sonography with graded compression, as well as color Doppler and duplex ultrasound were performed to evaluate the lower extremity deep venous systems from the level of the common femoral vein and including the common femoral, femoral, profunda femoral, popliteal and calf veins including the posterior tibial, peroneal and gastrocnemius veins when visible. The superficial  great saphenous vein was also interrogated. Spectral Doppler was utilized to evaluate flow at rest and with distal augmentation maneuvers in the common  femoral, femoral and popliteal veins.  COMPARISON:  None.  FINDINGS: Contralateral Common Femoral Vein: Respiratory phasicity is normal and symmetric with the symptomatic side. No evidence of thrombus. Normal compressibility.  Common Femoral Vein: No evidence of thrombus. Normal compressibility, respiratory phasicity and response to augmentation.  Saphenofemoral Junction: No evidence of thrombus. Normal compressibility and flow on color Doppler imaging.  Profunda Femoral Vein: No evidence of thrombus. Normal compressibility and flow on color Doppler imaging.  Femoral Vein: No evidence of thrombus. Normal compressibility, respiratory phasicity and response to augmentation.  Popliteal Vein: No evidence of thrombus. Normal compressibility, respiratory phasicity and response to augmentation.  Calf Veins: No evidence of thrombus. Normal compressibility and flow on color Doppler imaging.  Superficial Great Saphenous Vein: No evidence of thrombus. Normal compressibility and flow on color Doppler imaging.  Venous Reflux:  None.  Other Findings: There is a 5.4 cm fluid lesion in the popliteal fossa consistent with a popliteal cyst.  IMPRESSION: No evidence of deep venous thrombosis.  Left popliteal cyst.   Electronically Signed   By: Alcide Clever M.D.   On: 04/07/2015 15:16   Dg Chest Port 1 View  04/07/2015   CLINICAL DATA:  Shortness of breath.  EXAM: PORTABLE CHEST - 1 VIEW  COMPARISON:  04/06/2015.  CT 04/06/2015.  FINDINGS: Mediastinum hilar structures normal. Cardiomegaly with normal pulmonary vascularity. Interim clearing of pulmonary venous congestion and interstitial prominence suggesting clear clearing congestive heart failure. Mild basilar atelectasis. Small left pleural effusion cannot be excluded.  IMPRESSION: 1. Interim clearing of pulmonary venous congestion interstitial prominence suggesting clearing congestive heart failure. Small left pleural effusion may be present. 2. Low lung volumes with mild  bibasilar atelectasis.   Electronically Signed   By: Maisie Fus  Register   On: 04/07/2015 07:57   ASSESSMENT AND PLAN:  * Septic shock: Present on admission.  Good response to IV antibiotics and aggressive IV hydration.  Appreciate infectious disease consultation changed antibiotic to only IV Rocephin based on culture results.  Never required any pressors.  We will cut back on IV fluids as she started developing some dependent area fluid collection.  * Rapid atrial fibrillation: Likely due to sepsis, discontinue Cardizem drip and switch her over to long-acting oral Cardizem if her rate is controlled. She is already on Xarelto for anticoagulation. has known Chronic A. fib - appreciate Dr. America Brown input  * Diabetes mellitus: continue home regimen with decreased dose of Lantus and consult diabetic nurse coordinator will also on sliding scale insulin   * COPD: Continue her home inhalers and monitor on nasal cannula oxygen consider BiPAP if needed. Appreciate pulmonary input.  * Lower extremity swelling, left more than the right: Some erythema, no signs of obvious infection or cellulitis, lower extremity Doppler negative for DVT.   All the records are reviewed and case discussed with Care Management/Social Worker. Management plans discussed with the patient, family and they are in agreement.  CODE STATUS: Full code  TOTAL TIME (critical care) TAKING CARE OF THIS PATIENT: 35 minutes.   More than 50% of the time was spent in counseling/coordination of care: YES  POSSIBLE D/C IN 2-3 DAYS, DEPENDING ON CLINICAL CONDITION.  If her heart rate is better control, she can go to telemetry if she is not requiring any IV Cardizem drip.   [facility], Kalik Hoare M.D on 04/07/2015 at 3:31 PM  Between 7am to 6pm - Pager - [phone]  After 6pm go to [email or web address] - password [email or web address] [facility]  [place] [facility]  Office  [phone]  CC: Primary care physician; Mickey Farber, MD

## 2015-04-07 NOTE — Assessment & Plan Note (Addendum)
Uncertain if this represents a true COPD exacerbation, the patient is not currently on steroids. She appears to be improved.

## 2015-04-07 NOTE — Progress Notes (Signed)
RN spoke with Dr. Lady Gary about patient starting on PO cardizem with drip still running. MD ordered drip stopped after an hour of PO cardizem administration. MD will be by the see patient later.

## 2015-04-08 DIAGNOSIS — J438 Other emphysema: Secondary | ICD-10-CM

## 2015-04-08 DIAGNOSIS — E668 Other obesity: Secondary | ICD-10-CM

## 2015-04-08 DIAGNOSIS — J984 Other disorders of lung: Secondary | ICD-10-CM

## 2015-04-08 DIAGNOSIS — K449 Diaphragmatic hernia without obstruction or gangrene: Secondary | ICD-10-CM

## 2015-04-08 DIAGNOSIS — J80 Acute respiratory distress syndrome: Secondary | ICD-10-CM

## 2015-04-08 LAB — GLUCOSE, CAPILLARY
Glucose-Capillary: 108 mg/dL — ABNORMAL HIGH (ref 65–99)
Glucose-Capillary: 130 mg/dL — ABNORMAL HIGH (ref 65–99)
Glucose-Capillary: 152 mg/dL — ABNORMAL HIGH (ref 65–99)
Glucose-Capillary: 208 mg/dL — ABNORMAL HIGH (ref 65–99)

## 2015-04-08 LAB — CREATININE, SERUM
Creatinine, Ser: 1.18 mg/dL — ABNORMAL HIGH (ref 0.44–1.00)
GFR calc Af Amer: 50 mL/min — ABNORMAL LOW (ref >60)
GFR calc non Af Amer: 43 mL/min — ABNORMAL LOW (ref >60)

## 2015-04-08 LAB — CULTURE, BLOOD (ROUTINE X 2)

## 2015-04-08 LAB — HEMOGLOBIN: Hemoglobin: 10.4 g/dL — ABNORMAL LOW (ref 12.0–16.0)

## 2015-04-08 MED ORDER — DEXTROSE 5 % IV SOLN
12.0000 10*6.[IU] | Freq: Two times a day (BID) | INTRAVENOUS | Status: DC
  Administered 2015-04-08 – 2015-04-13 (×11): 12 10*6.[IU] via INTRAVENOUS
  Filled 2015-04-08 (×15): qty 12

## 2015-04-08 MED ORDER — RIVAROXABAN 20 MG PO TABS
20.0000 mg | ORAL_TABLET | Freq: Every day | ORAL | Status: DC
  Administered 2015-04-08 – 2015-04-13 (×6): 20 mg via ORAL
  Filled 2015-04-08 (×6): qty 1

## 2015-04-08 MED ORDER — INSULIN ASPART 100 UNIT/ML ~~LOC~~ SOLN
0.0000 [IU] | Freq: Three times a day (TID) | SUBCUTANEOUS | Status: DC
  Administered 2015-04-08 – 2015-04-13 (×2): 1 [IU] via SUBCUTANEOUS
  Administered 2015-04-13: 2 [IU] via SUBCUTANEOUS
  Filled 2015-04-08: qty 1
  Filled 2015-04-08: qty 2
  Filled 2015-04-08: qty 1

## 2015-04-08 MED ORDER — ACETAMINOPHEN 325 MG PO TABS
650.0000 mg | ORAL_TABLET | Freq: Four times a day (QID) | ORAL | Status: DC | PRN
  Administered 2015-04-08 – 2015-04-13 (×2): 650 mg via ORAL
  Filled 2015-04-08 (×2): qty 2

## 2015-04-08 NOTE — Care Management Note (Signed)
Case Management Note  Patient Details  Name: Allison Shepard MRN: [id number] Date of Birth: 04-06-1934  Subjective/Objective:    Per [facility],patient refused home O2 at last discharge. Will need qualifying O2 Sats  and a new order.                 Action/Plan:   Expected Discharge Date:  04/09/15               Expected Discharge Plan:  Home w Home Health Services  In-House Referral:     Discharge planning Services     Post Acute Care Choice:    Choice offered to:     DME Arranged:    DME Agency:     HH Arranged:    HH Agency:  [facility]  Status of Service:  In process, will continue to follow  Medicare Important Message Given:    Date Medicare IM Given:    Medicare IM give by:    Date Additional Medicare IM Given:    Additional Medicare Important Message give by:     If discussed at Long Length of Stay Meetings, dates discussed:    Additional Comments:  Marily Memos, RN 04/08/2015, 3:15 PM

## 2015-04-08 NOTE — Clinical Social Work Note (Signed)
In addition, patient states that her primary care physician office has been trying to get her oxygen at home but is having difficulty with her insurance. Will defer to RN CM to look into. York Spaniel MSW,LCSW

## 2015-04-08 NOTE — Progress Notes (Signed)
Patient alert and oriented no complaints of pain. Tolerating diet, having some stress incontinent. PICC line ordered, [facility] called.

## 2015-04-08 NOTE — Clinical Social Work Note (Signed)
Clinical Social Work Assessment  Patient Details  Name: Allison Shepard MRN: [id number] Date of Birth: 05-19-1934  Date of referral:  04/08/15               Reason for consult:  Facility Placement                Permission sought to share information with:    Permission granted to share information::     Name::        Agency::     Relationship::     Contact Information:     Housing/Transportation Living arrangements for the past 2 months:   (house) Source of Information:  Patient Patient Interpreter Needed:  None Criminal Activity/Legal Involvement Pertinent to Current Situation/Hospitalization:  No - Comment as needed Significant Relationships:    Lives with:  Spouse Do you feel safe going back to the place where you live?  Yes Need for family participation in patient care:  No (Coment)  Care giving concerns:  Patient lives with her husband who is currently in the same hospital receiving a pacemaker.   Social Worker assessment / plan:  CSW spoke with patient in her ICU room and informed her of the recommendation made by PT for rehab in a facility. Patient states that she will not go to rehab and will return home at discharge. She stated that her husband will be going home soon and that they have a daughter that will be assisting them locally and then another daughter who lives in [place] that will be coming into town on Friday.  Employment status:  Retired Health and safety inspector:  Medicare PT Recommendations:  Skilled Nursing Facility Information / Referral to community resources:     Patient/Family's Response to care: Patient is declining rehab recommendation currently.  Patient/Family's Understanding of and Emotional Response to Diagnosis, Current Treatment, and Prognosis:  Patient verbalizes appreciation for CSW assistance but declines recommendation for STR and wishes to return home.  Emotional Assessment Appearance:  Appears stated age Attitude/Demeanor/Rapport:    (pleasant and appropriate) Affect (typically observed):  Appropriate, Calm Orientation:  Oriented to Self, Oriented to Place, Oriented to  Time, Oriented to Situation Alcohol / Substance use:  Not Applicable Psych involvement (Current and /or in the community):  No (Comment)  Discharge Needs  Concerns to be addressed:  Care Coordination Readmission within the last 30 days:  Yes Current discharge risk:  None Barriers to Discharge:  No Barriers Identified   York Spaniel, LCSW 04/08/2015, 2:57 PM

## 2015-04-08 NOTE — Progress Notes (Signed)
MD Sherryll Burger was notified that pt is screaming out in pain and unable to get PICC placed. Cancel PICC. No further orders

## 2015-04-08 NOTE — Progress Notes (Signed)
[facility] CLINIC INFECTIOUS DISEASE PROGRESS NOTE Date of Admission:  04/06/2015     ID: KELICIA YOUTZ is a 81 y.o. female with Group G Strep bacteremia  Active Problems:   Acute respiratory distress   Sepsis   Diaphragmatic hernia   Restrictive lung disease secondary to obesity   Pneumonia   COPD exacerbation   Other emphysema   Morbid obesity   Debility   Subjective: No further fevers, clinically improving   ROS  Eleven systems are reviewed and negative except per hpi  Medications:  Antibiotics Given (last 72 hours)    Date/Time Action Medication Dose Rate   04/06/15 1230 Given   piperacillin-tazobactam (ZOSYN) IVPB 3.375 g 3.375 g 12.5 mL/hr   04/06/15 1331 Given   vancomycin (VANCOCIN) 1,250 mg in sodium chloride 0.9 % 250 mL IVPB 1,250 mg 166.7 mL/hr   04/06/15 2123 Given   piperacillin-tazobactam (ZOSYN) IVPB 3.375 g 3.375 g 12.5 mL/hr   04/07/15 0342 Given   piperacillin-tazobactam (ZOSYN) IVPB 3.375 g 3.375 g 12.5 mL/hr   04/07/15 1101 Given   azithromycin (ZITHROMAX) tablet 500 mg 500 mg    04/07/15 1232 Given   piperacillin-tazobactam (ZOSYN) IVPB 3.375 g 3.375 g 12.5 mL/hr   04/07/15 1405 Given   vancomycin (VANCOCIN) 1,250 mg in sodium chloride 0.9 % 250 mL IVPB 1,250 mg 166.7 mL/hr   04/08/15 1225 Given  [pharm delay]   cefTRIAXone (ROCEPHIN) 1 g in dextrose 5 % 50 mL IVPB 1 g 100 mL/hr     . antiseptic oral rinse  7 mL Mouth Rinse BID  . cefTRIAXone (ROCEPHIN)  IV  1 g Intravenous Q24H  . cholecalciferol  1,000 Units Oral Daily  . diltiazem  180 mg Oral Daily  . fluticasone  1 spray Each Nare Daily  . insulin aspart  0-10 Units Subcutaneous TID WC  . insulin glargine  25 Units Subcutaneous QHS  . meloxicam  7.5 mg Oral BID PC  . mometasone-formoterol  2 puff Inhalation BID  . rivaroxaban  20 mg Oral Q supper  . simvastatin  10 mg Oral Daily  . tiotropium  18 mcg Inhalation Daily    Objective: Vital signs in last 24 hours: Temp:  [98 F  (36.7 C)-98.3 F (36.8 C)] 98 F (36.7 C) (08/24 0700) Pulse Rate:  [25-132] 110 (08/24 0700) Resp:  [17-35] 21 (08/24 0700) BP: (111-145)/(59-110) 145/110 mmHg (08/24 1000) SpO2:  [89 %-100 %] 94 % (08/24 0700) Weight:  [115.1 kg (253 lb 12 oz)] 115.1 kg (253 lb 12 oz) (08/24 0545) Constitutional: Obese, ill appearing confused HENT: [place]/AT, PERRLA, no scleral icterus Mouth/Throat: Oropharynx is clear and dry . No oropharyngeal exudate.  Cardiovascular: Tachy irr  Pulmonary/Chest: poor air movement, rhonchi Neck = supple, no nuchal rigidity Abdominal: Soft. Bowel sounds are normal. exhibits no distension. There is no tenderness.  Lymphadenopathy: no cervical adenopathy. No axillary adenopathy Neurological: confused, slow metation Skin: Skin is warm and dry. No rash noted. No erythema.  Psychiatric: a normal mood and affect.   Lab Results  Recent Labs  04/06/15 0349 04/07/15 0520 04/08/15 0551  WBC 12.1* 11.0  --   HGB 14.1 11.3* 10.4*  HCT 42.3 33.7*  --   NA 139 139  --   K 5.0 4.4  --   CL 105 110  --   CO2 22 24  --   BUN 36* 26*  --   CREATININE 1.45* 1.34* 1.18*    Microbiology: Results for orders placed or  performed during the hospital encounter of 04/06/15  Blood Culture (routine x 2)     Status: None   Collection Time: 04/06/15  3:49 AM  Result Value Ref Range Status   Specimen Description BLOOD RIGHT WRIST  Final   Special Requests BOTTLES DRAWN AEROBIC AND ANAEROBIC 4CC  Final   Culture  Setup Time   Final    GRAM POSITIVE COCCI IN BOTH AEROBIC AND ANAEROBIC BOTTLES CRITICAL RESULT CALLED TO, READ BACK BY AND VERIFIED WITH: PAM MYERS ON 04/06/15 AT 1730 BY TB CONFIRMED BY TB/CAF    Culture   Final    STREPTOCOCCUS GROUP G IN BOTH AEROBIC AND ANAEROBIC BOTTLES There is no known Penicillin Resistant Beta Streptococcus in the [place] For patients that are Penicillin-allergic, Erythromycin is 85-94% susceptible, and Clindamycin is 80% susceptible.   Contact Microbiology within 7 days if sensitivity testing is  required.      Report Status 04/08/2015 FINAL  Final   Organism ID, Bacteria STREPTOCOCCUS GROUP G  Final      Susceptibility   Streptococcus group g - MIC*    CLINDAMYCIN SENSITIVE Sensitive     ERYTHROMYCIN SENSITIVE Sensitive     AMPICILLIN SENSITIVE Sensitive     * STREPTOCOCCUS GROUP G  Blood Culture (routine x 2)     Status: None   Collection Time: 04/06/15  3:49 AM  Result Value Ref Range Status   Specimen Description BLOOD RIGHT ASSIST CONTROL  Final   Special Requests BOTTLES DRAWN AEROBIC AND ANAEROBIC 5CC  Final   Culture  Setup Time   Final    GRAM POSITIVE COCCI IN BOTH AEROBIC AND ANAEROBIC BOTTLES CRITICAL VALUE NOTED.  VALUE IS CONSISTENT WITH PREVIOUSLY REPORTED AND CALLED VALUE.    Culture   Final    STREPTOCOCCUS GROUP G IN BOTH AEROBIC AND ANAEROBIC BOTTLES There is no known Penicillin Resistant Beta Streptococcus in the [place] For patients that are Penicillin-allergic, Erythromycin is 85-94% susceptible, and Clindamycin is 80% susceptible.  Contact Microbiology within 7 days if sensitivity testing is  required.      Report Status 04/08/2015 FINAL  Final   Organism ID, Bacteria STREPTOCOCCUS GROUP G  Final      Susceptibility   Streptococcus group g - MIC*    ERYTHROMYCIN SENSITIVE Sensitive     PENICILLIN SENSITIVE Sensitive     CLINDAMYCIN SENSITIVE Sensitive     * STREPTOCOCCUS GROUP G  Urine culture     Status: None (Preliminary result)   Collection Time: 04/06/15  3:49 AM  Result Value Ref Range Status   Specimen Description URINE, RANDOM  Final   Special Requests NONE  Final   Culture HOLDING FOR POSSIBLE PATHOGEN  Final   Report Status PENDING  Incomplete  MRSA PCR Screening     Status: None   Collection Time: 04/06/15 12:15 PM  Result Value Ref Range Status   MRSA by PCR NEGATIVE NEGATIVE Final    Comment:        The GeneXpert MRSA Assay (FDA approved for NASAL specimens only), is  one component of a comprehensive MRSA colonization surveillance program. It is not intended to diagnose MRSA infection nor to guide or monitor treatment for MRSA infections.   C difficile quick scan w PCR reflex     Status: None   Collection Time: 04/06/15  3:40 PM  Result Value Ref Range Status   C Diff antigen NEGATIVE NEGATIVE Final   C Diff toxin NEGATIVE NEGATIVE Final   C Diff  interpretation Negative for C. difficile  Final  Culture, sputum-assessment     Status: None   Collection Time: 04/06/15  3:45 PM  Result Value Ref Range Status   Specimen Description SPUTUM  Final   Special Requests NONE  Final   Sputum evaluation   Final    Sputum specimen not acceptable for testing.  Please recollect.     Report Status 04/06/2015 FINAL  Final     Studies/Results: US Venous Img Lower Unilateral Left  04/07/2015   CLINICAL DATA:  Left leg swelling and pain  EXAM: Left LOWER EXTREMITY VENOUS DOPPLER ULTRASOUND  TECHNIQUE: Gray-scale sonography with graded compression, as well as color Doppler and duplex ultrasound were performed to evaluate the lower extremity deep venous systems from the level of the common femoral vein and including the common femoral, femoral, profunda femoral, popliteal and calf veins including the posterior tibial, peroneal and gastrocnemius veins when visible. The superficial great saphenous vein was also interrogated. Spectral Doppler was utilized to evaluate flow at rest and with distal augmentation maneuvers in the common femoral, femoral and popliteal veins.  COMPARISON:  None.  FINDINGS: Contralateral Common Femoral Vein: Respiratory phasicity is normal and symmetric with the symptomatic side. No evidence of thrombus. Normal compressibility.  Common Femoral Vein: No evidence of thrombus. Normal compressibility, respiratory phasicity and response to augmentation.  Saphenofemoral Junction: No evidence of thrombus. Normal compressibility and flow on color Doppler  imaging.  Profunda Femoral Vein: No evidence of thrombus. Normal compressibility and flow on color Doppler imaging.  Femoral Vein: No evidence of thrombus. Normal compressibility, respiratory phasicity and response to augmentation.  Popliteal Vein: No evidence of thrombus. Normal compressibility, respiratory phasicity and response to augmentation.  Calf Veins: No evidence of thrombus. Normal compressibility and flow on color Doppler imaging.  Superficial Great Saphenous Vein: No evidence of thrombus. Normal compressibility and flow on color Doppler imaging.  Venous Reflux:  None.  Other Findings: There is a 5.4 cm fluid lesion in the popliteal fossa consistent with a popliteal cyst.  IMPRESSION: No evidence of deep venous thrombosis.  Left popliteal cyst.   Electronically Signed   By: Alcide Clever M.D.   On: 04/07/2015 15:16   Dg Chest Port 1 View  04/07/2015   CLINICAL DATA:  Shortness of breath.  EXAM: PORTABLE CHEST - 1 VIEW  COMPARISON:  04/06/2015.  CT 04/06/2015.  FINDINGS: Mediastinum hilar structures normal. Cardiomegaly with normal pulmonary vascularity. Interim clearing of pulmonary venous congestion and interstitial prominence suggesting clear clearing congestive heart failure. Mild basilar atelectasis. Small left pleural effusion cannot be excluded.  IMPRESSION: 1. Interim clearing of pulmonary venous congestion interstitial prominence suggesting clearing congestive heart failure. Small left pleural effusion may be present. 2. Low lung volumes with mild bibasilar atelectasis.   Electronically Signed   By: Maisie Fus  Register   On: 04/07/2015 07:57    Assessment/Plan: SHARIAH ASSAD is a 81 y.o. female with COPD, DM admitted with A fib with RVR and fever to 104. CT chest shows possible ground glass opacities vs pulmonary edema.  CT abd pelvis neg. BCx + Grp G strep.  C diff negative. UA unimpressive. FU CXR shows improvement Of note she did have recent admission and also recent steroid  injection in spine . Currently much improved. Only cx + is for the Grp G strep Recommendations Change to PCN IV 12 MU daily -   Will need 10 day course- Will place picc. Can be given continuous at home if [facility]  can arrange Discussed with pat and daughter at bedsde Thank you very much for the consult. Will follow with you.  Wendi Lastra   04/08/2015, 1:12 PM

## 2015-04-08 NOTE — Progress Notes (Signed)
Pt refused  ABG scheduled for this am. Pt was encouraged to follow MD orders but continued to refused.

## 2015-04-08 NOTE — Progress Notes (Signed)
[facility]  SUBJECTIVE: Rate in better control on current meds.    Filed Vitals:   04/08/15 1100 04/08/15 1200 04/08/15 1300 04/08/15 1535  BP: 123/75 138/86 120/106 125/71  Pulse: 59 63 133 89  Temp: 97.8 F (36.6 C)   98.3 F (36.8 C)  TempSrc:    Oral  Resp: 22 39 21 20  Height:      Weight:      SpO2: 94% 95% 91% 96%    Intake/Output Summary (Last 24 hours) at 04/08/15 1701 Last data filed at 04/08/15 1300  Gross per 24 hour  Intake    290 ml  Output    300 ml  Net    -10 ml    LABS: Basic Metabolic Panel:  Recent Labs  16/10/96 0349 04/07/15 0520 04/08/15 0551  NA 139 139  --   K 5.0 4.4  --   CL 105 110  --   CO2 22 24  --   GLUCOSE 114* 104*  --   BUN 36* 26*  --   CREATININE 1.45* 1.34* 1.18*  CALCIUM 9.0 7.9*  --    Liver Function Tests:  Recent Labs  04/06/15 0349 04/07/15 0520  AST 36 22  ALT 26 21  ALKPHOS 88 55  BILITOT 0.9 0.9  PROT 6.9 5.4*  ALBUMIN 3.5 2.7*    Recent Labs  04/06/15 0349  LIPASE 49   CBC:  Recent Labs  04/06/15 0349 04/07/15 0520 04/08/15 0551  WBC 12.1* 11.0  --   NEUTROABS 11.5*  --   --   HGB 14.1 11.3* 10.4*  HCT 42.3 33.7*  --   MCV 100.9* 102.0*  --   PLT 222 187  --    Cardiac Enzymes:  Recent Labs  04/06/15 0349  TROPONINI <0.03   BNP: Invalid input(s): POCBNP D-Dimer: No results for input(s): DDIMER in the last 72 hours. Hemoglobin A1C: No results for input(s): HGBA1C in the last 72 hours. Fasting Lipid Panel: No results for input(s): CHOL, HDL, LDLCALC, TRIG, CHOLHDL, LDLDIRECT in the last 72 hours. Thyroid Function Tests: No results for input(s): TSH, T4TOTAL, T3FREE, THYROIDAB in the last 72 hours.  Invalid input(s): FREET3 Anemia Panel: No results for input(s): VITAMINB12, FOLATE, FERRITIN, TIBC, IRON, RETICCTPCT in the last 72 hours.   Physical Exam: Blood pressure 125/71, pulse 89, temperature 98.3 F (36.8 C), temperature source  Oral, resp. rate 20, height  (1.651 m), weight 115.1 kg (253 lb 12 oz), SpO2 96 %.    General appearance: alert and cooperative Resp: clear to auscultation bilaterally Cardio: irregularly irregular rhythm GI: soft, non-tender; bowel sounds normal; no masses,  no organomegaly Extremities: extremities normal, atraumatic, no cyanosis or edema Neurologic: Grossly normal  TELEMETRY: Reviewed telemetry pt in afib with good rate control:  ASSESSMENT AND PLAN:  Active Problems:   Acute respiratory distress   Sepsis   Diaphragmatic hernia   Restrictive lung disease secondary to obesity   Pneumonia   COPD exacerbation   Other emphysema   Morbid obesity   Debility  Afib-continue with diltiazem 180 daily for rate control. COnntiue with xarelto at 20 mg daily for anticoagulation  Dalia Heading., MD, [facility] 04/08/2015 5:01 PM

## 2015-04-08 NOTE — Progress Notes (Signed)
[facility] [place] at [facility]   PATIENT NAME: Allison Shepard    MR#:  [id number]  DATE OF BIRTH:  [birth year]  SUBJECTIVE:  CHIEF COMPLAINT:   Chief Complaint  Patient presents with  . Altered Mental Status  doing much better, daughter at bedside.  Much alert in in good spirit REVIEW OF SYSTEMS:  Review of Systems  Constitutional: Negative for fever, weight loss, malaise/fatigue and diaphoresis.  HENT: Negative for ear discharge, ear pain, hearing loss, nosebleeds, sore throat and tinnitus.   Eyes: Negative for blurred vision and pain.  Respiratory: Negative for cough, hemoptysis, shortness of breath and wheezing.   Cardiovascular: Positive for leg swelling. Negative for chest pain, palpitations and orthopnea.  Gastrointestinal: Negative for heartburn, nausea, vomiting, abdominal pain, diarrhea, constipation and blood in stool.  Genitourinary: Negative for dysuria, urgency and frequency.  Musculoskeletal: Negative for myalgias and back pain.  Skin: Negative for itching and rash.  Neurological: Negative for dizziness, tingling, tremors, focal weakness, seizures, weakness and headaches.  Psychiatric/Behavioral: Negative for depression. The patient is not nervous/anxious.    DRUG ALLERGIES:   Allergies  Allergen Reactions  . Hydromorphone Hcl Nausea And Vomiting  . Ibuprofen Other (See Comments)    Pt was told by her MD not to take this medication.    . Mucinex [Guaifenesin Er] Other (See Comments)    Red all over. Felt like i was on fire  . Percocet [Oxycodone-Acetaminophen] Hives   VITALS:  Blood pressure 125/71, pulse 89, temperature 98.3 F (36.8 C), temperature source Oral, resp. rate 20, height  (1.651 m), weight 115.1 kg (253 lb 12 oz), SpO2 96 %. PHYSICAL EXAMINATION:  Physical Exam  Constitutional: She is oriented to person, place, and time and well-developed, well-nourished, and in no distress.  HENT:  Head: Normocephalic and  atraumatic.  Eyes: Conjunctivae and EOM are normal. Pupils are equal, round, and reactive to light.  Neck: Normal range of motion. Neck supple. No tracheal deviation present. No thyromegaly present.  Cardiovascular: Normal rate, regular rhythm and normal heart sounds.   Pulmonary/Chest: Effort normal and breath sounds normal. No respiratory distress. She has no wheezes. She exhibits no tenderness.  Abdominal: Soft. Bowel sounds are normal. She exhibits no distension. There is no tenderness.  Musculoskeletal: Normal range of motion. She exhibits edema.  Neurological: She is alert and oriented to person, place, and time. No cranial nerve deficit.  Skin: Skin is warm and dry. No rash noted. There is erythema (left lower extremity with small open wound.  No signs of infection).  Psychiatric: Mood and affect normal.   LABORATORY PANEL:   CBC  Recent Labs Lab 04/07/15 0520 04/08/15 0551  WBC 11.0  --   HGB 11.3* 10.4*  HCT 33.7*  --   PLT 187  --    ------------------------------------------------------------------------------------------------------------------ Chemistries   Recent Labs Lab 04/07/15 0520 04/08/15 0551  NA 139  --   K 4.4  --   CL 110  --   CO2 24  --   GLUCOSE 104*  --   BUN 26*  --   CREATININE 1.34* 1.18*  CALCIUM 7.9*  --   AST 22  --   ALT 21  --   ALKPHOS 55  --   BILITOT 0.9  --    RADIOLOGY:  No results found. ASSESSMENT AND PLAN:  * Septic shock: Present on admission.  Good response to IV antibiotics and aggressive IV hydration.  Appreciate infectious disease consultation changed  antibiotic to only IV Rocephin based on culture results.  Never required any pressors.  We will cut back on IV fluids as she started developing some dependent area fluid collection.  * Rapid atrial fibrillation: continue long-acting oral Cardizem if her rate is controlled.continue Xarelto for anticoagulation. has known Chronic A. fib - appreciate Dr. America Brown input  *  Diabetes mellitus: continue home regimen with decreased dose of Lantus and consult diabetic nurse coordinator will also on sliding scale insulin   * COPD: Continue her home inhalers and monitor on nasal cannula oxygen consider BiPAP if needed. Appreciate pulmonary input.  * Lower extremity swelling, left more than the right: Some erythema, no signs of obvious infection or cellulitis, lower extremity Doppler negative for DVT.   All the records are reviewed and case discussed with Care Management/Social Worker. Management plans discussed with the patient, family and they are in agreement.  CODE STATUS: Full code  TOTAL TIME (critical care) TAKING CARE OF THIS PATIENT: 35 minutes.   More than 50% of the time was spent in counseling/coordination of care: YES  POSSIBLE D/C IN 1-2 DAYS, DEPENDING ON CLINICAL CONDITION.  Transfer to Tele.   [facility], Taequan Stockhausen M.D on 04/08/2015 at 5:43 PM  Between 7am to 6pm - Pager - [phone]  After 6pm go to [email or web address] - password [email or web address] [facility]  [facility]  Office  [phone]  CC: Primary care physician; Mickey Farber, MD

## 2015-04-08 NOTE — Progress Notes (Signed)
[facility] Consultation      Assessment and Plan:81 yo female with PMHx of COPD, seen in follow up consultation for AMS/sepsis/?AECOPD, now with great clinical improvement.  Acute respiratory distress -improving --Likely due to pneumonia. --patient agreeable to ABG today, will follow up  Restrictive lung disease secondary to obesity -Weight loss would be beneficial.  Pneumonia Currently on adequate antibiotic coverage, infectious disease services, following  COPD exacerbation Uncertain if this represents a true COPD exacerbation, the patient is not currently on steroids, nor does she require it. . She appears to be improved. --Will need outpatient pulmonary function testing and follow-up with Dr. Meredeth Ide.  Sepsis -Now appears to be doing better. Continue antibiotics.  Morbid obesity -Weight loss would be beneficial to her breathing.  Diaphragmatic hernia -Likely contributing to her dyspnea and restrictive lung disease.  Debility -With deconditioning. Would recommend increased physical activity. May benefit from physical therapy and/or pulmonary rehabilitation.    Okay to transfer to floor from respiratory standpoint.  Date: 04/08/2015  MRN# [id number] Allison Shepard 23-Apr-1934  Referring Physician: Dr. Tiburcio Pea Allison Shepard is a 81 y.o. old female seen in consultation for chief complaint of:    Chief Complaint  Patient presents with  . Altered Mental Status   Subjective: Patient states that she is doing much better today, mild cough, but otherwise with great improvement. Initially refused ABG yesterday, but agreeable to it today.     HPI: as performed by Dr. Nicholos Johns  The patient is a 81 year old Caucasian female with history for COPD. She was brought into the hospital early Monday morning as her family found her with severe shaking chills and altered mental status. EMS was called and they noted thatthe patient was quite altered  mentally. She was also tachycardic with a heart rate of 160-180. The patient was brought to the ER and was noted that she had a room air saturation of 79%. She had a temperature as high as up to 104.5. Subsequent she was admitted to the hospital. The patient notes that she did have a cough for the past few days with increasing shortness of breath The patient is awake and alert but she is still somewhat confused about the circumstances of her admission. History is obtained from the patient. The chart as well as the patient's daughter at bedside. They tell me that the patient has a history of COPD she was seen a few years ago by Dr. Meredeth Ide at that time it was thought that she was stable. She was told that she could follow up on an as-needed basis. She tells me that she has been using an oral inhaler once daily and a rescue inhaler as needed. She has not required her rescue inhaler recently. She notes that her exertional dyspnea has become progressively worse over the last 1 year, she can drive a car and walk into a pharmacy be winded at that time. She has no other particular complaints at this time.  I reviewed the CT of the chest imaging and radiology report as well as the chest x-ray imaging and report from 04/07/2015; this shows significant lung restriction due to body habitus. There is a right-sided diaphragmatic hernia. There is bibasilar atelectasis and there are areas of possibly multifocal air disease versus atelectasis.  PMHX:   Past Medical History  Diagnosis Date  . COPD (chronic obstructive pulmonary disease)   . Diabetes mellitus without complication   . Atrial fibrillation   . Hypertension   .  Hypercholesteremia    Surgical Hx:  Past Surgical History  Procedure Laterality Date  . Abdominal hysterectomy    . Shoulder fusion surgery Right   . Knee surgery Right   . Hip surgery Right   . Back surgery     Family Hx:  No family history on file. Social Hx:   Social History    Substance Use Topics  . Smoking status: Never Smoker   . Smokeless tobacco: None  . Alcohol Use: 0.6 oz/week    0 Standard drinks or equivalent, 1 Glasses of wine per week     Comment: daily   Medication:   No current outpatient prescriptions on file.    Allergies:  Hydromorphone hcl; Ibuprofen; Mucinex; and Percocet  Review of Systems: Gen:  Denies  fever, sweats, chills HEENT: Denies blurred vision, double vision. bleeds, sore throat Cvc:  No dizziness, chest pain. Resp:   Mild cough, improved dyspnea Gi: Denies swallowing difficulty, stomach pain. Gu:  Denies bladder incontinence, burning urine Ext:   No Joint pain, stiffness. Skin: No skin rash,  hives Endoc:  No polyuria, polydipsia. Psych: No depression, insomnia. Other:  All other systems were reviewed with the patient and were negative other that what is mentioned in the HPI.   Physical Examination:   VS: BP 111/80 mmHg  Pulse 110  Temp(Src) 98 F (36.7 C) (Oral)  Resp 21  Ht  (1.651 m)  Wt 253 lb 12 oz (115.1 kg)  BMI 42.23 kg/m2  SpO2 94%  General Appearance: No distress  Neuro:without focal findings,  speech normal,  HEENT: PERRLA, EOM intact.   Pulmonary: normal breath sounds, No wheezing., good respiratory effort, mild dec BS at the bases.  CardiovascularNormal S1,S2.  No m/r/g.   Abdomen: Benign, Soft, non-tender. Renal:  No costovertebral tenderness  GU:  No performed at this time. Endoc: No evident thyromegaly, no signs of acromegaly. Skin:   warm, no rashes, no ecchymosis  Extremities: normal, no cyanosis, clubbing.  Other findings:    LABORATORY PANEL:   CBC  Recent Labs Lab 04/07/15 0520 04/08/15 0551  WBC 11.0  --   HGB 11.3* 10.4*  HCT 33.7*  --   PLT 187  --    ------------------------------------------------------------------------------------------------------------------  Chemistries   Recent Labs Lab 04/07/15 0520 04/08/15 0551  NA 139  --   K 4.4  --   CL  110  --   CO2 24  --   GLUCOSE 104*  --   BUN 26*  --   CREATININE 1.34* 1.18*  CALCIUM 7.9*  --   AST 22  --   ALT 21  --   ALKPHOS 55  --   BILITOT 0.9  --    ------------------------------------------------------------------------------------------------------------------  Cardiac Enzymes  Recent Labs Lab 04/06/15 0349  TROPONINI <0.03   ------------------------------------------------------------  RADIOLOGY:  US Venous Img Lower Unilateral Left  04/07/2015   CLINICAL DATA:  Left leg swelling and pain  EXAM: Left LOWER EXTREMITY VENOUS DOPPLER ULTRASOUND  TECHNIQUE: Gray-scale sonography with graded compression, as well as color Doppler and duplex ultrasound were performed to evaluate the lower extremity deep venous systems from the level of the common femoral vein and including the common femoral, femoral, profunda femoral, popliteal and calf veins including the posterior tibial, peroneal and gastrocnemius veins when visible. The superficial great saphenous vein was also interrogated. Spectral Doppler was utilized to evaluate flow at rest and with distal augmentation maneuvers in the common femoral, femoral and popliteal veins.  COMPARISON:  None.  FINDINGS: Contralateral Common Femoral Vein: Respiratory phasicity is normal and symmetric with the symptomatic side. No evidence of thrombus. Normal compressibility.  Common Femoral Vein: No evidence of thrombus. Normal compressibility, respiratory phasicity and response to augmentation.  Saphenofemoral Junction: No evidence of thrombus. Normal compressibility and flow on color Doppler imaging.  Profunda Femoral Vein: No evidence of thrombus. Normal compressibility and flow on color Doppler imaging.  Femoral Vein: No evidence of thrombus. Normal compressibility, respiratory phasicity and response to augmentation.  Popliteal Vein: No evidence of thrombus. Normal compressibility, respiratory phasicity and response to augmentation.  Calf Veins:  No evidence of thrombus. Normal compressibility and flow on color Doppler imaging.  Superficial Great Saphenous Vein: No evidence of thrombus. Normal compressibility and flow on color Doppler imaging.  Venous Reflux:  None.  Other Findings: There is a 5.4 cm fluid lesion in the popliteal fossa consistent with a popliteal cyst.  IMPRESSION: No evidence of deep venous thrombosis.  Left popliteal cyst.   Electronically Signed   By: Alcide Clever M.D.   On: 04/07/2015 15:16   Dg Chest Port 1 View  04/07/2015   CLINICAL DATA:  Shortness of breath.  EXAM: PORTABLE CHEST - 1 VIEW  COMPARISON:  04/06/2015.  CT 04/06/2015.  FINDINGS: Mediastinum hilar structures normal. Cardiomegaly with normal pulmonary vascularity. Interim clearing of pulmonary venous congestion and interstitial prominence suggesting clear clearing congestive heart failure. Mild basilar atelectasis. Small left pleural effusion cannot be excluded.  IMPRESSION: 1. Interim clearing of pulmonary venous congestion interstitial prominence suggesting clearing congestive heart failure. Small left pleural effusion may be present. 2. Low lung volumes with mild bibasilar atelectasis.   Electronically Signed   By: Maisie Fus  Register   On: 04/07/2015 07:57     Thank  you for the consultation and for allowing [facility] to assist in the care of your patient. Our recommendations are noted above.  Please contact us if we can be of further service.  Pulmonary consult time -  Stephanie Acre, MD [facility] Pager [phone] (please enter 7-digits) On Call Pager - [phone] (please enter 7-digits)

## 2015-04-08 NOTE — Progress Notes (Signed)
Pharmacy Note - rivaroxaban - renal dose adjustment   Creatinine clearance: 71 ml/min (via crocroft-gault using actual body weight)   Patient with orders for rivaroxaban  PO daily for atrial fibrillation previously adjusted from 20 mg daily due to renal insufficiency. CrCl now within acceptable limits to increase dosing back to 20 mg daily. Pharmacy to follow per anticoagulation policy.  [name], PharmD Clinical Pharmacist  04/08/2015 7:46 AM

## 2015-04-09 LAB — BLOOD GAS, ARTERIAL
ACID-BASE DEFICIT: 2.9 mmol/L — AB (ref 0.0–2.0)
Allens test (pass/fail): POSITIVE — AB
BICARBONATE: 22 meq/L (ref 21.0–28.0)
FIO2: 0.28
O2 Saturation: 95.4 %
PH ART: 7.37 (ref 7.350–7.450)
PO2 ART: 80 mmHg — AB (ref 83.0–108.0)
Patient temperature: 37
pCO2 arterial: 38 mmHg (ref 32.0–48.0)

## 2015-04-09 LAB — GLUCOSE, CAPILLARY
GLUCOSE-CAPILLARY: 116 mg/dL — AB (ref 65–99)
Glucose-Capillary: 136 mg/dL — ABNORMAL HIGH (ref 65–99)
Glucose-Capillary: 136 mg/dL — ABNORMAL HIGH (ref 65–99)
Glucose-Capillary: 168 mg/dL — ABNORMAL HIGH (ref 65–99)

## 2015-04-09 LAB — URINE CULTURE

## 2015-04-09 MED ORDER — LABETALOL HCL 5 MG/ML IV SOLN
10.0000 mg | INTRAVENOUS | Status: DC | PRN
  Administered 2015-04-09 (×2): 10 mg via INTRAVENOUS
  Filled 2015-04-09 (×2): qty 4

## 2015-04-09 NOTE — Progress Notes (Signed)
[facility] Medicine Consultation      Assessment and Plan:81 yo female with PMHx of COPD, seen in follow up consultation for AMS/sepsis/?AECOPD, now with great clinical improvement.  Acute respiratory distress -improving --Likely due to pneumonia. --ABG results reviewed and were normal.  Restrictive lung disease secondary to obesity -Weight loss would be beneficial.  Pneumonia Currently on adequate antibiotic coverage, infectious disease services, following  COPD exacerbation  She appears to be improved. --Will need outpatient pulmonary function testing and follow-up with Dr. Meredeth Ide.  Sepsis -Resolved  Morbid obesity -Weight loss would be beneficial to her breathing.  Diaphragmatic hernia -Likely contributing to her dyspnea and restrictive lung disease.  Debility -With deconditioning. Would recommend increased physical activity. May benefit from physical therapy and/or pulmonary rehabilitation.   Pulmonary service will sign off for now. Please call with any questions.    Date: 04/09/2015  MRN# [id number] Allison Shepard 1933/10/11  Referring Physician: Dr. Tiburcio Pea Allison Shepard is a 81 y.o. old female seen in consultation for chief complaint of:    Chief Complaint  Patient presents with  . Altered Mental Status   Subjective: Patient states that she is doing much better today, mild cough, but otherwise with great improvement. Initially refused ABG yesterday, but agreeable to it today.     HPI: as performed by Dr. Nicholos Johns  The patient is a 81 year old Caucasian female with history for COPD. She was brought into the hospital early Monday morning as her family found her with severe shaking chills and altered mental status. EMS was called and they noted thatthe patient was quite altered mentally. She was also tachycardic with a heart rate of 160-180. The patient was brought to the ER and was noted that she had a room air saturation of 79%.  She had a temperature as high as up to 104.5. Subsequent she was admitted to the hospital. The patient notes that she did have a cough for the past few days with increasing shortness of breath The patient is awake and alert but she is still somewhat confused about the circumstances of her admission. History is obtained from the patient. The chart as well as the patient's daughter at bedside. They tell me that the patient has a history of COPD she was seen a few years ago by Dr. Meredeth Ide at that time it was thought that she was stable. She was told that she could follow up on an as-needed basis. She tells me that she has been using an oral inhaler once daily and a rescue inhaler as needed. She has not required her rescue inhaler recently. She notes that her exertional dyspnea has become progressively worse over the last 1 year, she can drive a car and walk into a pharmacy be winded at that time. She has no other particular complaints at this time.  I reviewed the CT of the chest imaging and radiology report as well as the chest x-ray imaging and report from 04/07/2015; this shows significant lung restriction due to body habitus. There is a right-sided diaphragmatic hernia. There is bibasilar atelectasis and there are areas of possibly multifocal air disease versus atelectasis.  PMHX:   Past Medical History  Diagnosis Date  . COPD (chronic obstructive pulmonary disease)   . Diabetes mellitus without complication   . Atrial fibrillation   . Hypertension   . Hypercholesteremia    Surgical Hx:  Past Surgical History  Procedure Laterality Date  . Abdominal hysterectomy    . Shoulder fusion  surgery Right   . Knee surgery Right   . Hip surgery Right   . Back surgery     Family Hx:  No family history on file. Social Hx:   Social History  Substance Use Topics  . Smoking status: Never Smoker   . Smokeless tobacco: None  . Alcohol Use: 0.6 oz/week    0 Standard drinks or equivalent, 1 Glasses of  wine per week     Comment: daily   Medication:   No current outpatient prescriptions on file.    Allergies:  Hydromorphone hcl; Ibuprofen; Mucinex; and Percocet  Review of Systems: Gen:  Denies  fever, sweats, chills HEENT: Denies blurred vision, double vision. bleeds, sore throat Cvc:  No dizziness, chest pain. Resp:   Mild cough, improved dyspnea Gi: Denies swallowing difficulty, stomach pain. Gu:  Denies bladder incontinence, burning urine Ext:   No Joint pain, stiffness. Skin: No skin rash,  hives Endoc:  No polyuria, polydipsia. Psych: No depression, insomnia. Other:  All other systems were reviewed with the patient and were negative other that what is mentioned in the HPI.   Physical Examination:   VS: BP 146/97 mmHg  Pulse 95  Temp(Src) 97.8 F (36.6 C) (Oral)  Resp 20  Ht 5\' 5"  (1.651 m)  Wt 114.533 kg (252 lb 8 oz)  BMI 42.02 kg/m2  SpO2 95%  General Appearance: No distress  Neuro:without focal findings,  speech normal,  HEENT: PERRLA, EOM intact.   Pulmonary: normal breath sounds, No wheezing., good respiratory effort, mild dec BS at the bases.  CardiovascularNormal S1,S2.  No m/r/g.   Abdomen: Benign, Soft, non-tender. Renal:  No costovertebral tenderness  GU:  No performed at this time. Endoc: No evident thyromegaly, no signs of acromegaly. Skin:   warm, no rashes, no ecchymosis  Extremities: normal, no cyanosis, clubbing.  Other findings:    LABORATORY PANEL:   CBC  Recent Labs Lab 04/07/15 0520 04/08/15 0551  WBC 11.0  --   HGB 11.3* 10.4*  HCT 33.7*  --   PLT 187  --    ------------------------------------------------------------------------------------------------------------------  Chemistries   Recent Labs Lab 04/07/15 0520 04/08/15 0551  NA 139  --   K 4.4  --   CL 110  --   CO2 24  --   GLUCOSE 104*  --   BUN 26*  --   CREATININE 1.34* 1.18*  CALCIUM 7.9*  --   AST 22  --   ALT 21  --   ALKPHOS 55  --   BILITOT 0.9   --    ------------------------------------------------------------------------------------------------------------------  Cardiac Enzymes  Recent Labs Lab 04/06/15 0349  TROPONINI <0.03   ------------------------------------------------------------  RADIOLOGY:  No results found.   Thank  you for the consultation and for allowing [facility] to assist in the care of your patient. Our recommendations are noted above.  Please contact us if we can be of further service.    Wells Guiles, MD [facility]

## 2015-04-09 NOTE — Progress Notes (Deleted)
Informed Dr. Sheryle Hail that patient heart rate 130, no PRN meds to control rate. New order: labetalol  Q4hrs  >110 systolic

## 2015-04-09 NOTE — Progress Notes (Signed)
[facility]  SUBJECTIVE: I feel good today. Afib rate well controlled this am. Transient rapid vr with agitation   Filed Vitals:   04/09/15 0302 04/09/15 0500 04/09/15 0553 04/09/15 0800  BP: 176/100  159/91 151/103  Pulse: 138  104 100  Temp: 98.3 F (36.8 C)  97.7 F (36.5 C) 98 F (36.7 C)  TempSrc: Oral  Oral Oral  Resp:   20 20  Height:      Weight:  114.533 kg (252 lb 8 oz)    SpO2:   94% 92%    Intake/Output Summary (Last 24 hours) at 04/09/15 0841 Last data filed at 04/09/15 0346  Gross per 24 hour  Intake  727.5 ml  Output      1 ml  Net  726.5 ml    LABS: Basic Metabolic Panel:  Recent Labs  09/81/19 0520 04/08/15 0551  NA 139  --   K 4.4  --   CL 110  --   CO2 24  --   GLUCOSE 104*  --   BUN 26*  --   CREATININE 1.34* 1.18*  CALCIUM 7.9*  --    Liver Function Tests:  Recent Labs  04/07/15 0520  AST 22  ALT 21  ALKPHOS 55  BILITOT 0.9  PROT 5.4*  ALBUMIN 2.7*   No results for input(s): LIPASE, AMYLASE in the last 72 hours. CBC:  Recent Labs  04/07/15 0520 04/08/15 0551  WBC 11.0  --   HGB 11.3* 10.4*  HCT 33.7*  --   MCV 102.0*  --   PLT 187  --    Cardiac Enzymes: No results for input(s): CKTOTAL, CKMB, CKMBINDEX, TROPONINI in the last 72 hours. BNP: Invalid input(s): POCBNP D-Dimer: No results for input(s): DDIMER in the last 72 hours. Hemoglobin A1C: No results for input(s): HGBA1C in the last 72 hours. Fasting Lipid Panel: No results for input(s): CHOL, HDL, LDLCALC, TRIG, CHOLHDL, LDLDIRECT in the last 72 hours. Thyroid Function Tests: No results for input(s): TSH, T4TOTAL, T3FREE, THYROIDAB in the last 72 hours.  Invalid input(s): FREET3 Anemia Panel: No results for input(s): VITAMINB12, FOLATE, FERRITIN, TIBC, IRON, RETICCTPCT in the last 72 hours.   Physical Exam: Blood pressure 151/103, pulse 100, temperature 98 F (36.7 C), temperature source Oral, resp. rate 20, height 5'  5" (1.651 m), weight 114.533 kg (252 lb 8 oz), SpO2 92 %.    General appearance: alert and cooperative Resp: clear to auscultation bilaterally Cardio: irregularly irregular rhythm GI: soft, non-tender; bowel sounds normal; no masses,  no organomegaly Extremities: extremities normal, atraumatic, no cyanosis or edema Neurologic: Grossly normal  TELEMETRY: Reviewed telemetry pt in afib with controlled vr:  ASSESSMENT AND PLAN:  Active Problems:   Acute respiratory distress   Sepsis   Diaphragmatic hernia   Restrictive lung disease secondary to obesity   Pneumonia-on abx. Will continue   COPD exacerbation   Other emphysema   Morbid obesity   Debility  Afib-rate fairly well controlled at present. Will continue with curent meds orally including diltiazem at 180 mg daily, and anticoagulate with xarelto  Dalia Heading., MD, [facility] 04/09/2015 8:41 AM

## 2015-04-09 NOTE — Progress Notes (Signed)
Endocrinology inpatient follow up  Consult for: inpatient diabetes management  HPI: Allison Shepard is a 81 y.o. female with a known history of COPD and type 2 diabetes diagnosed 03/2014 who was admitted for septic shock thought to be secondary to pneumonia.  Endocrinology following for inpatient diabetes management. We have adjusted her home regimen to Lantus 25 units once nightly and novolog ISS.   Interim history:  She was transferred from ICU to telemetry yesterday. Clinically improving. Glycemic control has been good. She is eating 100% of meals. Denies any hypoglycemia events or symptoms. Plans are being made for discharge to rehab.   Current facility-administered medications:  .  0.9 %  sodium chloride infusion, , Intravenous, Continuous, Delfino Lovett, MD, Last Rate: 50 mL/hr at 04/08/15 2249 .  acetaminophen (TYLENOL) tablet 650 mg, 650 mg, Oral, Q6H PRN, Oralia Manis, MD, 650 mg at 04/08/15 0414 .  albuterol (PROVENTIL) (2.5 MG/3ML) 0.083% nebulizer solution 2.5 mg, 2.5 mg, Nebulization, Q6H PRN, Delfino Lovett, MD .  alum & mag hydroxide-simeth (MAALOX/MYLANTA) 200-200-20 MG/5ML suspension 30 mL, 30 mL, Oral, Q6H PRN, Delfino Lovett, MD .  antiseptic oral rinse (CPC / CETYLPYRIDINIUM CHLORIDE 0.05%) solution 7 mL, 7 mL, Mouth Rinse, BID, Vipul Shah, MD, 7 mL at 04/09/15 1010 .  cholecalciferol (VITAMIN D) tablet 1,000 Units, 1,000 Units, Oral, Daily, Delfino Lovett, MD, 1,000 Units at 04/09/15 1004 .  diltiazem (TIAZAC) 24 hr capsule 180 mg, 180 mg, Oral, Daily, Vipul Shah, MD, 180 mg at 04/08/15 1000 .  fluticasone (FLONASE) 50 MCG/ACT nasal spray 1 spray, 1 spray, Each Nare, Daily, Delfino Lovett, MD, 1 spray at 04/09/15 1004 .  insulin aspart (novoLOG) injection 0-10 Units, 0-10 Units, Subcutaneous, TID WC, Artina Minella Lanetta Inch, MD, 1 Units at 04/08/15 1225 .  insulin glargine (LANTUS) injection 25 Units, 25 Units, Subcutaneous, QHS, Mattison Golay Lanetta Inch, MD, 25 Units at 04/08/15 2215 .  labetalol  (NORMODYNE,TRANDATE) injection 10 mg, 10 mg, Intravenous, Q4H PRN, Arnaldo Natal, MD, 10 mg at 04/09/15 0313 .  mometasone-formoterol (DULERA) 100-5 MCG/ACT inhaler 2 puff, 2 puff, Inhalation, BID, Delfino Lovett, MD, 2 puff at 04/09/15 1005 .  norepinephrine (LEVOPHED)  in D5W premix infusion, 0-40 mcg/min, Intravenous, Titrated, Vipul Shah, MD .  ondansetron (ZOFRAN) tablet 4 mg, 4 mg, Oral, Q6H PRN **OR** ondansetron (ZOFRAN) injection 4 mg, 4 mg, Intravenous, Q6H PRN, Delfino Lovett, MD .  penicillin G potassium 12 Million Units in dextrose 5 % 500 mL continuous infusion, 12 Million Units, Intravenous, Q12H, Clydie Braun, MD, 12 Million Units at 04/08/15 2214 .  rivaroxaban (XARELTO) tablet 20 mg, 20 mg, Oral, Q supper, Delfino Lovett, MD, 20 mg at 04/08/15 1656 .  simvastatin (ZOCOR) tablet 10 mg, 10 mg, Oral, Daily, Vipul Shah, MD, 10 mg at 04/09/15 1004 .  tiotropium (SPIRIVA) inhalation capsule 18 mcg, 18 mcg, Inhalation, Daily, Delfino Lovett, MD, 18 mcg at 04/09/15 1007  Filed Vitals:   04/09/15 0302 04/09/15 0500 04/09/15 0553 04/09/15 0800  BP: 176/100  159/91 151/103  Pulse: 138  104 100  Temp: 98.3 F (36.8 C)  97.7 F (36.5 C) 98 F (36.7 C)  TempSrc: Oral  Oral Oral  Resp:   20 20  Height:      Weight:  114.533 kg (252 lb 8 oz)    SpO2:   94% 92%   Gen: obese elderly woman, no acute distress Neuro: AAOx3, able to answer questions appropriately HEENT: eyes anicteric, EOMI CVS:RRR, s1, s2 Pulm: end-expiratory wheezes  audible anteriorly, good air movement, no resp distress on supplemental O2  Abdomen: obese, soft, non-tender to palpation Extremity: wearing compression stockings with some drainage through on the left lower extremity Psych: normal affect, fair insight into her medical condition  Labs:   Component     Latest Ref Rng 04/08/2015 04/08/2015 04/08/2015 04/08/2015 04/09/2015        12:02 AM  7:30 AM 11:12 AM  8:32 PM   Glucose-Capillary     65 - 99 mg/dL 536  (H) 644 (H) 034 (H) 130 (H) 136 (H)    Assessment:  81 yo woman with controlled and uncomplicated type 2 DM admitted with septic shock secondary to pneumonia.   Recommendations:  Continue Lantus 25 units nightly   Continue correction scale novolog for BG>200 mg/dL Check fingerstick BG AC/HS (4xdaily) Would discharge on the above regimen  She will f/u in clinic with me within 2 weeks of dc Thank you for allowing me to participate in this patients care.   Doylene Canning, MD [facility] Endocr[facility]ology

## 2015-04-09 NOTE — Progress Notes (Signed)
[facility] - [place] at [facility]   PATIENT NAME: Allison Shepard    MR#:  [id number]  DATE OF BIRTH:  1934-07-28  SUBJECTIVE:  CHIEF COMPLAINT:   Chief Complaint  Patient presents with  . Altered Mental Status  Got really panic yesterday when tried PICC line yesterday. She is very clear not wanting to take the PICC line, she is also refusing Rehab REVIEW OF SYSTEMS:  Review of Systems  Constitutional: Negative for fever, weight loss, malaise/fatigue and diaphoresis.  HENT: Negative for ear discharge, ear pain, hearing loss, nosebleeds, sore throat and tinnitus.   Eyes: Negative for blurred vision and pain.  Respiratory: Negative for cough, hemoptysis, shortness of breath and wheezing.   Cardiovascular: Negative for chest pain, palpitations, orthopnea and leg swelling.  Gastrointestinal: Negative for heartburn, nausea, vomiting, abdominal pain, diarrhea, constipation and blood in stool.  Genitourinary: Negative for dysuria, urgency and frequency.  Musculoskeletal: Negative for myalgias and back pain.  Skin: Negative for itching and rash.  Neurological: Negative for dizziness, tingling, tremors, focal weakness, seizures, weakness and headaches.  Psychiatric/Behavioral: Negative for depression. The patient is not nervous/anxious.    DRUG ALLERGIES:   Allergies  Allergen Reactions  . Hydromorphone Hcl Nausea And Vomiting  . Ibuprofen Other (See Comments)    Pt was told by her MD not to take this medication.    . Mucinex [Guaifenesin Er] Other (See Comments)    Red all over. Felt like i was on fire  . Percocet [Oxycodone-Acetaminophen] Hives   VITALS:  Blood pressure 146/97, pulse 95, temperature 97.8 F (36.6 C), temperature source Oral, resp. rate 20, height  (1.651 m), weight 114.533 kg (252 lb 8 oz), SpO2 95 %. PHYSICAL EXAMINATION:  Physical Exam  Constitutional: She is oriented to person, place, and time and well-developed,  well-nourished, and in no distress.  HENT:  Head: Normocephalic and atraumatic.  Eyes: Conjunctivae and EOM are normal. Pupils are equal, round, and reactive to light.  Neck: Normal range of motion. Neck supple. No tracheal deviation present. No thyromegaly present.  Cardiovascular: Normal rate, regular rhythm and normal heart sounds.   Pulmonary/Chest: Effort normal and breath sounds normal. No respiratory distress. She has no wheezes. She exhibits no tenderness.  Abdominal: Soft. Bowel sounds are normal. She exhibits no distension. There is no tenderness.  Musculoskeletal: Normal range of motion. She exhibits edema.  Neurological: She is alert and oriented to person, place, and time. No cranial nerve deficit.  Skin: Skin is warm and dry. No rash noted. There is erythema (left lower extremity with small open wound.  No signs of infection).  Psychiatric: Mood and affect normal.   LABORATORY PANEL:   CBC  Recent Labs Lab 04/07/15 0520 04/08/15 0551  WBC 11.0  --   HGB 11.3* 10.4*  HCT 33.7*  --   PLT 187  --    ------------------------------------------------------------------------------------------------------------------ Chemistries   Recent Labs Lab 04/07/15 0520 04/08/15 0551  NA 139  --   K 4.4  --   CL 110  --   CO2 24  --   GLUCOSE 104*  --   BUN 26*  --   CREATININE 1.34* 1.18*  CALCIUM 7.9*  --   AST 22  --   ALT 21  --   ALKPHOS 55  --   BILITOT 0.9  --    RADIOLOGY:  No results found. ASSESSMENT AND PLAN:  * Septic shock: Present on admission.  Discussed with Dr. Sampson Goon as  she is not wanting PICC line and long-term IV antibiotics, we will keep her on IV antibiotics while in the hospital and switch her to oral amoxicillin for 3 weeks with close follow-up with Dr. Sampson Goon in the office.  * Rapid atrial fibrillation: continue long-acting oral Cardizem if her rate is controlled.continue Xarelto for anticoagulation. has known Chronic A. fib -  appreciate Dr. America Brown input  * Diabetes mellitus: continue current regimen.  Appreciate diabetic nurse coordinator input. on sliding scale insulin   * COPD: Continue her home inhalers and monitor on nasal cannula oxygen consider BiPAP if needed. Appreciate pulmonary input.  * Lower extremity swelling, left more than the right: Some erythema, no signs of obvious infection or cellulitis, lower extremity Doppler negative for DVT.   All the records are reviewed and case discussed with Care Management/Social Worker. Management plans discussed with the patient, family and they are in agreement.  Physical therapy recommends rehabilitation.  Patient has been refusing  CODE STATUS: Full code  TOTAL TIME TAKING CARE OF THIS PATIENT: 35 minutes.   More than 50% of the time was spent in counseling/coordination of care: YES (discussed with daughter at bedside)  POSSIBLE D/C IN AM, DEPENDING ON CLINICAL CONDITION.   [facility], Tyree Vandruff M.D on 04/09/2015 at 12:25 PM  Between 7am to 6pm - Pager - [phone]  After 6pm go to [email or web address] - [email or web address] [facility]  [facility]  Office  [phone]  CC: Primary care physician; Mickey Farber, MD

## 2015-04-09 NOTE — Progress Notes (Signed)
Physical Therapy Treatment Patient Details Name: Allison Shepard MRN: [id number] DOB: 1934-04-10 Today's Date: 04/09/2015    History of Present Illness Pt admitted by daughter with altered mental status, EMS found her to be tachycardic with HR between 160-180 and O2 sats as low as 79%, febrile. Pt was diagnosed with septic shock.      PT Comments    Pt demonstrates very poor tolerance for activity today due to elevated HR and dropping SaO2 with activity on supplemental O2. She fatigues very quickly with short ambulation and demonstrates poor awareness of exertion. Time spent with family educating about sleeping positions for breathing, possible need for hospital bed, and safety with mobility. Pt really needs to be at SNF and if she returns home is a high risk for readmission. Currently she is refusing SNF placement so will need HH PT and 24/7 supervision. Pt will benefit from skilled PT services to address deficits in strength, balance, and mobility in order to return to full function at home.    Follow Up Recommendations  SNF (Pt refusing currently, please arrange HH PT/OOB supervision)     Equipment Recommendations  Rolling walker with 5" wheels;Hospital bed    Recommendations for Other Services       Precautions / Restrictions Precautions Precautions: Fall Precaution Comments: Tachycardia Restrictions Weight Bearing Restrictions: No    Mobility  Bed Mobility Overal bed mobility: Needs Assistance Bed Mobility: Supine to Sit     Supine to sit: Modified independent (Device/Increase time)     General bed mobility comments: Pt demonstrates improved supine to sit today but requires HOB elevated and use of bed rails.  Transfers Overall transfer level: Needs assistance Equipment used: Rolling walker (2 wheeled) Transfers: Sit to/from Stand Sit to Stand: Min guard         General transfer comment: Pt demonstrates reasonable strength and stability with standing but she  requires cues for safe hand placement during transfer. Also performed chair to [facility] transfer with patient with cues for safe turning and reaching for seated surface  Ambulation/Gait Ambulation/Gait assistance: Min assist Ambulation Distance (Feet): 20 Feet Assistive device: Rolling walker (2 wheeled) Gait Pattern/deviations: Decreased step length - right;Decreased step length - left   Gait velocity interpretation: <1.8 ft/sec, indicative of risk for recurrent falls General Gait Details: Pt ambulates to door and back to recliner with therapist. Therapist monitors vitals continuously during ambulation with HR peaking around 145 bpm and SaO2 dropping to 84% on 2 L/min O2. Pt visibly fatigued and encouraged back to recliner. Pt is unable to make it back to the recliner but has to stop to sit on the bed. Pt provided cues for pursed lip breathing and supplemental O2 increased to 4L/min temporarily due to pt difficulty breathing. HR quickly returns to 105-115 range which is where she started at rest and with limited bed mobility. Within 60 seconds SaO2 rebounds to >90% and pt reports significant improvement in breathing   Stairs            Wheelchair Mobility    Modified Rankin (Stroke Patients Only)       Balance Overall balance assessment: Needs assistance Sitting-balance support: No upper extremity supported;Feet supported Sitting balance-Leahy Scale: Good     Standing balance support: Bilateral upper extremity supported Standing balance-Leahy Scale: Fair                      Cognition Arousal/Alertness: Awake/alert Behavior During Therapy: WFL for tasks assessed/performed Overall Cognitive Status:  Within Functional Limits for tasks assessed                      Exercises General Exercises - Lower Extremity Ankle Circles/Pumps: Strengthening;Both;10 reps;Supine Quad Sets: Strengthening;Both;10 reps;Supine Gluteal Sets: Strengthening;Both;10 reps;Supine Heel  Slides: Strengthening;Both;10 reps;Supine Hip ABduction/ADduction: Strengthening;Both;10 reps;Supine Straight Leg Raises: Strengthening;Both;10 reps;Supine    General Comments        Pertinent Vitals/Pain Pain Assessment: No/denies pain    Home Living                      Prior Function            PT Goals (current goals can now be found in the care plan section) Acute Rehab PT Goals Patient Stated Goal: "I want to get back home." PT Goal Formulation: With patient Time For Goal Achievement: 04/21/15 Potential to Achieve Goals: Good Progress towards PT goals: Progressing toward goals    Frequency  Min 2X/week    PT Plan Current plan remains appropriate    Co-evaluation             End of Session Equipment Utilized During Treatment: Gait belt Activity Tolerance: Treatment limited secondary to medical complications (Comment) (Decreased cardiopulmonary endurance) Patient left: with call bell/phone within reach;with nursing/sitter in room;in chair;with chair alarm set     Time: 0855-     Charges:  $Therapeutic Exercise: 23-37 mins $Therapeutic Activity: 8-22 mins                    G Codes:      Allison Shepard Allison Shepard PT, DPT   Allison Shepard 04/09/2015, 10:38 AM

## 2015-04-09 NOTE — Care Management Important Message (Signed)
Important Message  Patient Details  Name: Allison Shepard MRN: [id number] Date of Birth: 06-30-1934   Medicare Important Message Given:  Yes-second notification given    Allison Shepard 04/09/2015, 9:55 AM

## 2015-04-09 NOTE — Progress Notes (Signed)
   04/09/15 0900  Clinical Encounter Type  Visited With Patient not available;Family  Visit Type Initial  Referral From Social work  Consult/Referral To Orthoptist  Spiritual Encounters  Spiritual Needs Literature  Advance Directives (For Healthcare)  Does patient have an advance directive? No  Would patient like information on creating an advanced directive? Yes - Transport planner given  Visited with family member who will give Advance directive materials to patient.  Provided pastoral support.  [employer] Allison Shepard-pager [phone]

## 2015-04-09 NOTE — Progress Notes (Signed)
Informed Dr. Sheryle Hail that patient heart rate 130, no PRN meds to control rate. New order: labetalol  Q4hrs HR >110

## 2015-04-09 NOTE — Care Management (Signed)
Patient transferred to 2A from icu.  She was recently discharged from [facility]  03/20/2015.  At that time this CM set up home 02 and home health services through [facility].  Patient at that time had been strongly encouraged to considered skilled nursing.  She insisted on discharging before was really medically stable.  Found that patient ultimately refused the home 0xygen and never allowed home health agency to see her.  Spoke with patient and with permission in the presence of her daughter laura.  Discussing need for hospital bed and physical therapy recommendation again of skilled nursing.  Reviewed with patient and daughter the concerns of not following through with previous discharge plan. With patient's permission also spoke with her husband who is being discharge home today after pacemaker insertion.  Discussed recommendation of skilled nursing facility and thus further need for home health and most likely home 02 after transition back to the home.  Husband says "that guy told me it cost 600 dollar for that big piece of oxygen equipment and insurance was not going to pay for it."  Discussed that the financial liability had been discussed prior to dischrrge and delivery staff do not usually address financial matters.  Informed him that medicare would have covered 80% and secondary insurance would have covered 20%.  Verbalized understanding.  Patient is in need of IV antibiotics but she became so agitated and anxious during PICC line insertion process that procedure had to be aborted.  She is now agreeable for short term skilled nursing.  Facility will need to reassess for home 02, hospital bed, home health nurse, PT, Aide OT at time of discharge.  UPdated CSW

## 2015-04-10 LAB — GLUCOSE, CAPILLARY
GLUCOSE-CAPILLARY: 121 mg/dL — AB (ref 65–99)
GLUCOSE-CAPILLARY: 144 mg/dL — AB (ref 65–99)
Glucose-Capillary: 120 mg/dL — ABNORMAL HIGH (ref 65–99)
Glucose-Capillary: 147 mg/dL — ABNORMAL HIGH (ref 65–99)

## 2015-04-10 MED ORDER — ZOLPIDEM TARTRATE 5 MG PO TABS
5.0000 mg | ORAL_TABLET | Freq: Every day | ORAL | Status: DC
  Administered 2015-04-10 – 2015-04-13 (×4): 5 mg via ORAL
  Filled 2015-04-10 (×4): qty 1

## 2015-04-10 MED ORDER — ATORVASTATIN CALCIUM 20 MG PO TABS
40.0000 mg | ORAL_TABLET | Freq: Every day | ORAL | Status: DC
  Administered 2015-04-10 – 2015-04-13 (×4): 40 mg via ORAL
  Filled 2015-04-10 (×4): qty 2

## 2015-04-10 MED ORDER — DILTIAZEM HCL ER COATED BEADS 180 MG PO CP24
180.0000 mg | ORAL_CAPSULE | Freq: Every day | ORAL | Status: DC
  Administered 2015-04-10 – 2015-04-13 (×4): 180 mg via ORAL
  Filled 2015-04-10 (×5): qty 1

## 2015-04-10 MED ORDER — LISINOPRIL 5 MG PO TABS
5.0000 mg | ORAL_TABLET | Freq: Every day | ORAL | Status: DC
  Administered 2015-04-10 – 2015-04-14 (×5): 5 mg via ORAL
  Filled 2015-04-10 (×5): qty 1

## 2015-04-10 MED ORDER — INFLUENZA VAC SPLIT QUAD 0.5 ML IM SUSY
0.5000 mL | PREFILLED_SYRINGE | INTRAMUSCULAR | Status: AC
  Administered 2015-04-11: 0.5 mL via INTRAMUSCULAR
  Filled 2015-04-10: qty 0.5

## 2015-04-10 MED ORDER — ALPRAZOLAM 0.5 MG PO TABS
0.5000 mg | ORAL_TABLET | Freq: Three times a day (TID) | ORAL | Status: DC | PRN
  Administered 2015-04-11 – 2015-04-13 (×3): 0.5 mg via ORAL
  Filled 2015-04-10 (×5): qty 1

## 2015-04-10 NOTE — Progress Notes (Signed)
[facility] Physicians - [place] at [facility]   PATIENT NAME: Allison Shepard    MR#:  [id number]  DATE OF BIRTH:  08/14/1934  SUBJECTIVE:  CHIEF COMPLAINT:   Chief Complaint  Patient presents with  . Altered Mental Status  had an episode of rapid A. fib, heart rate as fast as of 147 this morning, also tachypneic and oxygen requirement went up to 4 L which was about 2 L y'day. REVIEW OF SYSTEMS:  Review of Systems  Constitutional: Negative for fever, weight loss, malaise/fatigue and diaphoresis.  HENT: Negative for ear discharge, ear pain, hearing loss, nosebleeds, sore throat and tinnitus.   Eyes: Negative for blurred vision and pain.  Respiratory: Positive for cough and shortness of breath. Negative for hemoptysis and wheezing.   Cardiovascular: Positive for palpitations. Negative for chest pain, orthopnea and leg swelling.  Gastrointestinal: Negative for heartburn, nausea, vomiting, abdominal pain, diarrhea, constipation and blood in stool.  Genitourinary: Negative for dysuria, urgency and frequency.  Musculoskeletal: Negative for myalgias and back pain.  Skin: Negative for itching and rash.  Neurological: Negative for dizziness, tingling, tremors, focal weakness, seizures, weakness and headaches.  Psychiatric/Behavioral: Negative for depression. The patient is not nervous/anxious.    DRUG ALLERGIES:   Allergies  Allergen Reactions  . Hydromorphone Hcl Nausea And Vomiting  . Ibuprofen Other (See Comments)    Pt was told by her MD not to take this medication.    . Mucinex [Guaifenesin Er] Other (See Comments)    Red all over. Felt like i was on fire  . Percocet [Oxycodone-Acetaminophen] Hives   VITALS:  Blood pressure 168/91, pulse 114, temperature 98.1 F (36.7 C), temperature source Oral, resp. rate 20, height 5\' 5"  (1.651 m), weight 111.993 kg (246 lb 14.4 oz), SpO2 92 %. PHYSICAL EXAMINATION:  Physical Exam  Constitutional: She is oriented to person,  place, and time and well-developed, well-nourished, and in no distress.  HENT:  Head: Normocephalic and atraumatic.  Eyes: Conjunctivae and EOM are normal. Pupils are equal, round, and reactive to light.  Neck: Normal range of motion. Neck supple. No tracheal deviation present. No thyromegaly present.  Cardiovascular: Normal rate, regular rhythm and normal heart sounds.   Pulmonary/Chest: Breath sounds normal. Tachypnea noted. She is in respiratory distress. She has no wheezes. She exhibits no tenderness.  Abdominal: Soft. Bowel sounds are normal. She exhibits no distension. There is no tenderness.  Musculoskeletal: Normal range of motion. She exhibits edema.  Neurological: She is alert and oriented to person, place, and time. No cranial nerve deficit.  Skin: Skin is warm and dry. No rash noted. No erythema.  Psychiatric: Affect normal. Her mood appears anxious.   LABORATORY PANEL:   CBC  Recent Labs Lab 04/07/15 0520 04/08/15 0551  WBC 11.0  --   HGB 11.3* 10.4*  HCT 33.7*  --   PLT 187  --    ------------------------------------------------------------------------------------------------------------------ Chemistries   Recent Labs Lab 04/07/15 0520 04/08/15 0551  NA 139  --   K 4.4  --   CL 110  --   CO2 24  --   GLUCOSE 104*  --   BUN 26*  --   CREATININE 1.34* 1.18*  CALCIUM 7.9*  --   AST 22  --   ALT 21  --   ALKPHOS 55  --   BILITOT 0.9  --    ASSESSMENT AND PLAN:  * Septic shock: Present on admission.  Blood cultures growing Streptococcus G - considering her  recent admission and also recent steroid injection in spine that she may have a nosocomial infection. Discussed with Dr. Sampson Goon as she is not wanting PICC line and long-term IV antibiotics, we will keep her on IV antibiotics while in the hospital and switch her to oral amoxicillin for 3 weeks with close follow-up with Dr. Sampson Goon in the office.  * Rapid atrial fibrillation: continue long-acting oral  Cardizem if her rate is controlled.continue Xarelto for anticoagulation. has known Chronic A. fib - appreciate Dr. America Brown input  * Diabetes mellitus: continue current regimen.  Appreciate diabetic nurse coordinator input. on sliding scale insulin   * COPD: Continue her home inhalers and monitor on nasal cannula oxygen consider BiPAP if needed. Appreciate pulmonary input.  * Lower extremity swelling, left more than the right: Some erythema, no signs of obvious infection or cellulitis, lower extremity Doppler negative for DVT.    * Anxiety and insomnia: Will try Xanax, also Ambien at bedtime   All the records are reviewed and case discussed with Care Management/Social Worker. Management plans discussed with the patient, family and they are in agreement.  Physical therapy recommends rehabilitation.  Patient has been refusing.  Finally, she is accepting it today.  Though she would like to [place].  CODE STATUS: Full code  TOTAL TIME TAKING CARE OF THIS PATIENT: 35 minutes.   More than 50% of the time was spent in counseling/coordination of care: YES (discussed with daughter at bedside)  POSSIBLE D/C EARLY NEXT WEEK/MONDAY, DEPENDING ON CLINICAL CONDITION.  Likely need rehabilitation   [facility], Brittish Bolinger M.D on 04/10/2015 at 12:55 PM  Between 7am to 6pm - Pager - [phone]  After 6pm go to [email or web address] - password [email or web address] [facility]  [place] [facility]  Office  [phone]  CC: Primary care physician; Mickey Farber, MD

## 2015-04-10 NOTE — Progress Notes (Signed)
Initial Nutrition Assessment   INTERVENTION:   Meals and Snacks: Cater to patient preferences; pt may benefit from heart healthy/carb modified diet order.   NUTRITION DIAGNOSIS:   No nutrition diagnosis at this time  GOAL:   Patient will meet greater than or equal to 90% of their needs  MONITOR:    (Energy Intake, glucose profile, Anthropometrics)  REASON FOR ASSESSMENT:   LOS    ASSESSMENT:   Pt admitted with AMS and septic shock, with afib.  Past Medical History  Diagnosis Date  . COPD (chronic obstructive pulmonary disease)   . Diabetes mellitus without complication   . Atrial fibrillation   . Hypertension   . Hypercholesteremia      Diet Order:  Diet Heart Room service appropriate?: Yes; Fluid consistency:: Thin    Current Nutrition: Recorded po intake 90-100% of meals since admission   Food/Nutrition-Related History: Per MST no decrease in appetite PTA   Medications: vitamin D, novolog, Lantus, NS at 37mL/hr  Electrolyte/Renal Profile and Glucose Profile:   Recent Labs Lab 04/06/15 0349 04/07/15 0520 04/08/15 0551  NA 139 139  --   K 5.0 4.4  --   CL 105 110  --   CO2 22 24  --   BUN 36* 26*  --   CREATININE 1.45* 1.34* 1.18*  CALCIUM 9.0 7.9*  --   GLUCOSE 114* 104*  --    Protein Profile:  Recent Labs Lab 04/06/15 0349 04/07/15 0520  ALBUMIN 3.5 2.7*    Gastrointestinal Profile: Last BM: 04/09/2015   Weight Change: RD notes stated weight of 200lbs on 04/06/2015. Current measured weight of 246lbs.  Height:   Ht Readings from Last 1 Encounters:  04/06/15  (1.651 m)    Weight:   Wt Readings from Last 1 Encounters:  04/10/15 246 lb 14.4 oz (111.993 kg)   Wt Readings from Last 10 Encounters:  04/10/15 246 lb 14.4 oz (111.993 kg)  03/25/15 200 lb (90.719 kg)  03/17/15 249 lb 8 oz (113.172 kg)  03/16/15 200 lb (90.719 kg)  03/13/15 200 lb (90.719 kg)  03/11/15 200 lb (90.719 kg)    BMI:  Body mass index is 41.09  kg/(m^2).  EDUCATION NEEDS:   No education needs identified at this time   LOW Care Level  Leda Quail, [place], LDN Pager [phone]

## 2015-04-10 NOTE — Progress Notes (Signed)
Patient with intermittent complaints of difficulty breathing and chest pain. Labetalol given during the night for increased heart rate and BP. Med was effective. Breathing treatment also given. O2 increased to 4L to keep SATS above 90/

## 2015-04-10 NOTE — Clinical Social Work Note (Signed)
CSW extended bed offers to pt and her daughter. CSW will f/u for families decision.

## 2015-04-10 NOTE — Progress Notes (Signed)
Patient alert and oriented x4, no complaints at this time. vss at this time. Patient afib on telemetry. Will continue to assess. Haron Beilke R [place]   

## 2015-04-10 NOTE — Progress Notes (Signed)
Physical Therapy Treatment Patient Details Name: Allison Shepard MRN: [id number] DOB: [birth year] Today's Date: 04/10/2015    History of Present Illness Pt admitted by daughter with altered mental status, EMS found her to be tachycardic with HR between 160-180 and O2 sats as low as 79%, febrile. Pt was diagnosed with septic shock.      PT Comments    Pt with tachycardia at rest so transfers and ambulation deferred at this time. Vitals continually monitored during bed exercises. HR ranges between 110-135 and SaO2 remains >90% on supplemental O2. Pt able to complete all exercises as instructed. Patient's husband is visiting and in room during session. He is reporting feeling dizzy and lightheaded today. He had pacemaker placed 2 days ago. Vitals taken and are WNL. Daughter advised to call cardiology which she does during therapy session. Cardiologist agrees to appointment at 2:00 this afternoon. Husband placed in recliner with arm rests and encouraged to notify RN if he feels worse. RN notified of situation.   Follow Up Recommendations  SNF     Equipment Recommendations  Rolling walker with 5" wheels;Hospital bed    Recommendations for Other Services       Precautions / Restrictions Precautions Precautions: Fall Precaution Comments: Tachycardia Restrictions Weight Bearing Restrictions: No    Mobility  Bed Mobility               General bed mobility comments: Deferred  Transfers                 General transfer comment: Deferred  Ambulation/Gait             General Gait Details: Deferred   Stairs            Wheelchair Mobility    Modified Rankin (Stroke Patients Only)       Balance                                    Cognition Arousal/Alertness: Awake/alert Behavior During Therapy: WFL for tasks assessed/performed Overall Cognitive Status: Within Functional Limits for tasks assessed                       Exercises General Exercises - Lower Extremity Ankle Circles/Pumps: Strengthening;Both;10 reps;Supine Quad Sets: Strengthening;Both;10 reps;Supine Gluteal Sets: Strengthening;Both;10 reps;Supine Short Arc Quad: Strengthening;Both;10 reps;Supine Heel Slides: Strengthening;Both;10 reps;Supine Hip ABduction/ADduction: Strengthening;Both;10 reps;Supine Straight Leg Raises: Strengthening;Both;10 reps;Supine    General Comments        Pertinent Vitals/Pain Pain Assessment: No/denies pain    Home Living                      Prior Function            PT Goals (current goals can now be found in the care plan section) Acute Rehab PT Goals Patient Stated Goal: "I want to get back home." PT Goal Formulation: With patient Time For Goal Achievement: 04/21/15 Potential to Achieve Goals: Good Progress towards PT goals: Progressing toward goals    Frequency  Min 2X/week    PT Plan Current plan remains appropriate    Co-evaluation             End of Session   Activity Tolerance: Treatment limited secondary to medical complications (Comment) Patient left: with call bell/phone within reach;in bed;with bed alarm set     Time: 1145-1200 PT Time Calculation (min) (ACUTE  ONLY): 15 min  Charges:  $Therapeutic Exercise: 8-22 mins                    G Codes:      Sharalyn Ink Normalee Sistare PT, DPT   Purvi Ruehl 04/10/2015, 12:15 PM

## 2015-04-11 ENCOUNTER — Inpatient Hospital Stay: Payer: Medicare Other

## 2015-04-11 DIAGNOSIS — J189 Pneumonia, unspecified organism: Secondary | ICD-10-CM

## 2015-04-11 DIAGNOSIS — A408 Other streptococcal sepsis: Secondary | ICD-10-CM | POA: Diagnosis not present

## 2015-04-11 LAB — GLUCOSE, CAPILLARY
GLUCOSE-CAPILLARY: 135 mg/dL — AB (ref 65–99)
Glucose-Capillary: 126 mg/dL — ABNORMAL HIGH (ref 65–99)
Glucose-Capillary: 173 mg/dL — ABNORMAL HIGH (ref 65–99)
Glucose-Capillary: 204 mg/dL — ABNORMAL HIGH (ref 65–99)

## 2015-04-11 MED ORDER — ALBUTEROL SULFATE HFA 108 (90 BASE) MCG/ACT IN AERS: 2.0000 | INHALATION_SPRAY | RESPIRATORY_TRACT | Status: DC | PRN

## 2015-04-11 MED ORDER — DIGOXIN 125 MCG PO TABS
0.1250 mg | ORAL_TABLET | Freq: Every day | ORAL | Status: DC
  Administered 2015-04-11 – 2015-04-14 (×4): 0.125 mg via ORAL
  Filled 2015-04-11 (×4): qty 1

## 2015-04-11 MED ORDER — FUROSEMIDE 10 MG/ML IJ SOLN
10.0000 mg/h | INTRAVENOUS | Status: AC
  Administered 2015-04-11: 10 mg/h via INTRAVENOUS
  Filled 2015-04-11: qty 25

## 2015-04-11 NOTE — Progress Notes (Signed)
No distress No new complaints Appears a little breathless with speech but she indicates that this is her norm  Filed Vitals:   04/10/15 2146 04/11/15 0522 04/11/15 0851 04/11/15 0955  BP: 156/83 145/96 147/91 144/96  Pulse: 80 113 111 99  Temp:  97.7 F (36.5 C)    TempSrc:  Oral    Resp:  18    Height:      Weight:  113.989 kg (251 lb 4.8 oz)    SpO2:  90%    4 lpm [place]  No overt dyspnea @ rest HEENT WNL JVP not well visualized Diminished BS, no wheezes heard IRIR, tachy, no M noted Obese, soft, NT No edema  BMP Latest Ref Rng 04/08/2015 04/07/2015 04/06/2015  Glucose 65 - 99 mg/dL - 161(W) 960(A)  BUN 6 - 20 mg/dL - 54(U) 98(J)  Creatinine 0.44 - 1.00 mg/dL 1.91(Y) 7.82(N) 5.62(Z)  Sodium 135 - 145 mmol/L - 139 139  Potassium 3.5 - 5.1 mmol/L - 4.4 5.0  Chloride 101 - 111 mmol/L - 110 105  CO2 22 - 32 mmol/L - 24 22  Calcium 8.9 - 10.3 mg/dL - 7.9(L) 9.0    CBC Latest Ref Rng 04/08/2015 04/07/2015 04/06/2015  WBC 3.6 - 11.0 K/uL - 11.0 12.1(H)  Hemoglobin 12.0 - 16.0 g/dL 10.4(L) 11.3(L) 14.1  Hematocrit 35.0 - 47.0 % - 33.7(L) 42.3  Platelets 150 - 440 K/uL - 187 222    No new CXR  IMPRESSION: Acute on chronic hypoxic respiratory failure Strep bacteremia - possible pulmonary source Obesity - contributor to chronic resp failure AFRVR - prohibiting discharge Suspect component of pulm edema  PLAN/REC: DC NS Recheck CXR - ordered If CXR reveals evidence of edema, consider diuresis Duration of abx therapy per ID service Mgmt of AFRVR per primary team and cardiology She will need long term O2 after discharge Discharge regimen should include Symbicort and PRN albuterol  Pulm medicine will sign off. Please call if we can be of further assistance  [name], MD PCCM service Mobile [phone] Pager [phone]

## 2015-04-11 NOTE — Progress Notes (Signed)
Could not safely ambulate patient this evening because of SOB. Ordered to get sats while ambulating. Night shift instructed to pass on to day shift to try in AM after rest. Allison Shepard

## 2015-04-11 NOTE — Progress Notes (Signed)
Patient alert and oriented x4, no complaints at this time. Patient afib on telemetry. Oxygen decreased to 3L, o2 sats dropped to 86%, returned to 4L. Patient unable to tolerate less than 4L, patient SOB at rest on 3L. Will continue to assess. Patient o2 sats stable at this time at 91% on 4L. Trudee Kuster

## 2015-04-11 NOTE — Progress Notes (Signed)
[facility]   PATIENT NAME: Allison Shepard    MR#:  [id number]  DATE OF BIRTH:  12-02-1933  SUBJECTIVE:  CHIEF COMPLAINT:   Chief Complaint  Patient presents with  . Altered Mental Status  had an episode of rapid A. fib, heart rate as fast as of 135-160  this morning, also tachypneic and oxygen requirement went up to 4 L . Patient is becoming hypoxic with 86% pulse ox on 3 L of oxygen and her pulse ox is at 90% on 4 L.. Patient is reporting significant A. fib with RVR recently with minimal exertion. Denies any chest pain REVIEW OF SYSTEMS:  Review of Systems  Constitutional: Negative for fever, weight loss, malaise/fatigue and diaphoresis.  HENT: Negative for ear discharge, ear pain, hearing loss, nosebleeds, sore throat and tinnitus.   Eyes: Negative for blurred vision and pain.  Respiratory: Positive for cough and shortness of breath. Negative for hemoptysis and wheezing.   Cardiovascular: Positive for palpitations. Negative for chest pain, orthopnea and leg swelling.  Gastrointestinal: Negative for heartburn, nausea, vomiting, abdominal pain, diarrhea, constipation and blood in stool.  Genitourinary: Negative for dysuria, urgency and frequency.  Musculoskeletal: Negative for myalgias and back pain.  Skin: Negative for itching and rash.  Neurological: Negative for dizziness, tingling, tremors, focal weakness, seizures, weakness and headaches.  Psychiatric/Behavioral: Negative for depression. The patient is not nervous/anxious.    DRUG ALLERGIES:   Allergies  Allergen Reactions  . Hydromorphone Hcl Nausea And Vomiting  . Ibuprofen Other (See Comments)    Pt was told by her MD not to take this medication.    . Mucinex [Guaifenesin Er] Other (See Comments)    Red all over. Felt like i was on fire  . Percocet [Oxycodone-Acetaminophen] Hives   VITALS:  Blood pressure 130/84, pulse 73, temperature 97.9 F (36.6 C), temperature  source Oral, resp. rate 17, height  (1.651 m), weight 113.989 kg (251 lb 4.8 oz), SpO2 93 %. PHYSICAL EXAMINATION:  Physical Exam  Constitutional: She is oriented to person, place, and time and well-developed, well-nourished, and in no distress.  HENT:  Head: Normocephalic and atraumatic.  Eyes: Conjunctivae and EOM are normal. Pupils are equal, round, and reactive to light.  Neck: Normal range of motion. Neck supple. No tracheal deviation present. No thyromegaly present.  Cardiovascular: Normal rate, regular rhythm and normal heart sounds.   Pulmonary/Chest: Breath sounds normal. Tachypnea noted. She is in respiratory distress. She has no wheezes. She exhibits no tenderness.  Abdominal: Soft. Bowel sounds are normal. She exhibits no distension. There is no tenderness.  Musculoskeletal: Normal range of motion. She exhibits edema.  Neurological: She is alert and oriented to person, place, and time. No cranial nerve deficit.  Skin: Skin is warm and dry. No rash noted. No erythema.  Psychiatric: Affect normal. Her mood appears anxious.   LABORATORY PANEL:   CBC  Recent Labs Lab 04/07/15 0520 04/08/15 0551  WBC 11.0  --   HGB 11.3* 10.4*  HCT 33.7*  --   PLT 187  --    ------------------------------------------------------------------------------------------------------------------ Chemistries   Recent Labs Lab 04/07/15 0520 04/08/15 0551  NA 139  --   K 4.4  --   CL 110  --   CO2 24  --   GLUCOSE 104*  --   BUN 26*  --   CREATININE 1.34* 1.18*  CALCIUM 7.9*  --   AST 22  --   ALT 21  --  ALKPHOS 55  --   BILITOT 0.9  --    ASSESSMENT AND PLAN:  * Septic shock: Present on admission.  Blood cultures growing Streptococcus G - considering her recent admission and also recent steroid injection in spine that she may have a nosocomial infection. Discussed with Dr. Sampson Goon as she is not wanting PICC line and long-term IV antibiotics, we will keep her on IV antibiotics  while in the hospital and switch her to oral amoxicillin thousand milligrams by mouth twice a day for 2 weeks until September 5 if she gets discharged with outpatient follow-up with Dr. Sampson Goon in the office.  * Rapid atrial fibrillation: continue long-acting oral Cardizem if her rate is controlled.continue Xarelto for anticoagulation. Adding digoxin for persistent A. fib with RVR with minimal exertion has known Chronic A. fib - will follow up with Dr. Lady Gary  * Diabetes mellitus: continue current regimen.  Appreciate diabetic nurse coordinator input. on sliding scale insulin   * COPD: Continue her home inhalers and monitor on nasal cannula oxygen consider BiPAP if needed. Appreciate pulmonary input. We will add Symbicort and albuterol inhaler as needed basis at the time of discharge Pulmonology signed off  * Lower extremity swelling, left more than the right: Some erythema, no signs of obvious infection or cellulitis, lower extremity Doppler negative for DVT.    * Anxiety and insomnia: Will try Xanax, also Ambien at bedtime  * Disposition-will follow up with case management regarding placement   All the records are reviewed and case discussed with Care Management/Social Worker. Management plans discussed with the patient, family and they are in agreement.  Physical therapy recommends rehabilitation.  Patient has been refusing.  Finally, she is accepting it today.  Though she would like to [place].  CODE STATUS: Full code  TOTAL TIME TAKING CARE OF THIS PATIENT: 35 minutes.   More than 50% of the time was spent in counseling/coordination of care: YES (discussed with daughter at bedside)  POSSIBLE D/C EARLY NEXT WEEK/MONDAY, DEPENDING ON CLINICAL CONDITION.  Likely need rehabilitation   Ramonita Lab M.D on 04/11/2015 at 3:51 PM  Between 7am to 6pm - Pager - [phone]  After 6pm go to [email or web address] - password [email or web address] [facility]  [facility]  Office   [phone]  CC: Primary care physician; Mickey Farber, MD

## 2015-04-11 NOTE — Progress Notes (Signed)
Notified Dr. Amado Coe of L lower extremity pain and swelling. [place] ordered to r/o DVT. Dr. Sampson Goon also notified of possible antibiotic change, ordered for extremity to be elevated and he will assess in AM. Dr. Amado Coe asked for blood cultures if patient spikes fever. Trudee Kuster

## 2015-04-11 NOTE — Progress Notes (Signed)
[facility] CLINIC INFECTIOUS DISEASE PROGRESS NOTE Date of Admission:  04/06/2015     ID: Allison Shepard is a 81 y.o. female with Group G Strep bacteremia  Active Problems:   Acute respiratory distress   Sepsis   Diaphragmatic hernia   Restrictive lung disease secondary to obesity   Pneumonia   COPD exacerbation   Other emphysema   Morbid obesity   Debility   Subjective: No further fevers, clinically improving Had refused picc line  ROS  Eleven systems are reviewed and negative except per hpi  Medications:  Antibiotics Given (last 72 hours)    Date/Time Action Medication Dose Rate   04/08/15 2214 Given   penicillin G potassium 12 Million Units in dextrose 5 % 500 mL continuous infusion 12 Million Units 41.7 mL/hr   04/09/15 1000 Given   penicillin G potassium 12 Million Units in dextrose 5 % 500 mL continuous infusion 12 Million Units 41.7 mL/hr   04/09/15 2307 Given   penicillin G potassium 12 Million Units in dextrose 5 % 500 mL continuous infusion 12 Million Units 41.7 mL/hr   04/10/15 1023 Given   penicillin G potassium 12 Million Units in dextrose 5 % 500 mL continuous infusion 12 Million Units 41.7 mL/hr   04/10/15 2139 Given   penicillin G potassium 12 Million Units in dextrose 5 % 500 mL continuous infusion 12 Million Units 41.7 mL/hr   04/11/15 0852 Given   penicillin G potassium 12 Million Units in dextrose 5 % 500 mL continuous infusion 12 Million Units 41.7 mL/hr     . antiseptic oral rinse  7 mL Mouth Rinse BID  . atorvastatin  40 mg Oral q1800  . cholecalciferol  1,000 Units Oral Daily  . diltiazem  180 mg Oral Daily  . fluticasone  1 spray Each Nare Daily  . insulin aspart  0-10 Units Subcutaneous TID WC  . insulin glargine  25 Units Subcutaneous QHS  . lisinopril  5 mg Oral Daily  . mometasone-formoterol  2 puff Inhalation BID  . penicillin g continuous IV infusion  12 Million Units Intravenous Q12H  . rivaroxaban  20 mg Oral Q supper  . zolpidem   5 mg Oral QHS    Objective: Vital signs in last 24 hours: Temp:  [97.7 F (36.5 C)-98 F (36.7 C)] 97.9 F (36.6 C) (08/27 1135) Pulse Rate:  [73-125] 73 (08/27 1135) Resp:  [17-20] 17 (08/27 1135) BP: (130-157)/(83-109) 130/84 mmHg (08/27 1135) SpO2:  [90 %-94 %] 93 % (08/27 1135) Weight:  [113.989 kg (251 lb 4.8 oz)] 113.989 kg (251 lb 4.8 oz) (08/27 0522) Constitutional: Obese, ill appearing confused HENT: /AT, PERRLA, no scleral icterus Mouth/Throat: Oropharynx is clear and dry . No oropharyngeal exudate.  Cardiovascular: Tachy irr  Pulmonary/Chest: poor air movement, rhonchi Neck = supple, no nuchal rigidity Abdominal: Soft. Bowel sounds are normal. exhibits no distension. There is no tenderness.  Lymphadenopathy: no cervical adenopathy. No axillary adenopathy Neurological: confused, slow metation Skin: Skin is warm and dry. No rash noted. No erythema.  Psychiatric: a normal mood and affect.   Lab Results No results for input(s): WBC, HGB, HCT, NA, K, CL, CO2, BUN, CREATININE, GLU in the last 72 hours.  Invalid input(s): PLATELETS  Microbiology: Results for orders placed or performed during the hospital encounter of 04/06/15  Blood Culture (routine x 2)     Status: None   Collection Time: 04/06/15  3:49 AM  Result Value Ref Range Status   Specimen Description BLOOD  RIGHT WRIST  Final   Special Requests BOTTLES DRAWN AEROBIC AND ANAEROBIC 4CC  Final   Culture  Setup Time   Final    GRAM POSITIVE COCCI IN BOTH AEROBIC AND ANAEROBIC BOTTLES CRITICAL RESULT CALLED TO, READ BACK BY AND VERIFIED WITH: PAM MYERS ON 04/06/15 AT 1730 BY TB CONFIRMED BY TB/CAF    Culture   Final    STREPTOCOCCUS GROUP G IN BOTH AEROBIC AND ANAEROBIC BOTTLES There is no known Penicillin Resistant Beta Streptococcus in the [place] For patients that are Penicillin-allergic, Erythromycin is 85-94% susceptible, and Clindamycin is 80% susceptible.  Contact Microbiology within 7 days if  sensitivity testing is  required.      Report Status 04/08/2015 FINAL  Final   Organism ID, Bacteria STREPTOCOCCUS GROUP G  Final      Susceptibility   Streptococcus group g - MIC*    CLINDAMYCIN SENSITIVE Sensitive     ERYTHROMYCIN SENSITIVE Sensitive     AMPICILLIN SENSITIVE Sensitive     * STREPTOCOCCUS GROUP G  Blood Culture (routine x 2)     Status: None   Collection Time: 04/06/15  3:49 AM  Result Value Ref Range Status   Specimen Description BLOOD RIGHT ASSIST CONTROL  Final   Special Requests BOTTLES DRAWN AEROBIC AND ANAEROBIC 5CC  Final   Culture  Setup Time   Final    GRAM POSITIVE COCCI IN BOTH AEROBIC AND ANAEROBIC BOTTLES CRITICAL VALUE NOTED.  VALUE IS CONSISTENT WITH PREVIOUSLY REPORTED AND CALLED VALUE.    Culture   Final    STREPTOCOCCUS GROUP G IN BOTH AEROBIC AND ANAEROBIC BOTTLES There is no known Penicillin Resistant Beta Streptococcus in the [place] For patients that are Penicillin-allergic, Erythromycin is 85-94% susceptible, and Clindamycin is 80% susceptible.  Contact Microbiology within 7 days if sensitivity testing is  required.      Report Status 04/08/2015 FINAL  Final   Organism ID, Bacteria STREPTOCOCCUS GROUP G  Final      Susceptibility   Streptococcus group g - MIC*    ERYTHROMYCIN SENSITIVE Sensitive     PENICILLIN SENSITIVE Sensitive     CLINDAMYCIN SENSITIVE Sensitive     * STREPTOCOCCUS GROUP G  Urine culture     Status: None   Collection Time: 04/06/15  3:49 AM  Result Value Ref Range Status   Specimen Description URINE, RANDOM  Final   Special Requests NONE  Final   Culture 30,000 COLONIES/mL AEROCOCCUS URINAE  Final   Report Status 04/09/2015 FINAL  Final  MRSA PCR Screening     Status: None   Collection Time: 04/06/15 12:15 PM  Result Value Ref Range Status   MRSA by PCR NEGATIVE NEGATIVE Final    Comment:        The GeneXpert MRSA Assay (FDA approved for NASAL specimens only), is one component of a comprehensive MRSA  colonization surveillance program. It is not intended to diagnose MRSA infection nor to guide or monitor treatment for MRSA infections.   C difficile quick scan w PCR reflex     Status: None   Collection Time: 04/06/15  3:40 PM  Result Value Ref Range Status   C Diff antigen NEGATIVE NEGATIVE Final   C Diff toxin NEGATIVE NEGATIVE Final   C Diff interpretation Negative for C. difficile  Final  Culture, sputum-assessment     Status: None   Collection Time: 04/06/15  3:45 PM  Result Value Ref Range Status   Specimen Description SPUTUM  Final  Special Requests NONE  Final   Sputum evaluation   Final    Sputum specimen not acceptable for testing.  Please recollect.     Report Status 04/06/2015 FINAL  Final     Studies/Results: Dg Chest 2 View  04/11/2015   CLINICAL DATA:  81 year old female with history of COPD, hypertension and diabetes.  EXAM: CHEST  2 VIEW  COMPARISON:  Chest x-ray 11/02/2014.  FINDINGS: There is cephalization of the pulmonary vasculature and slight indistinctness of the interstitial markings suggestive of mild pulmonary edema. Small bilateral pleural effusions. Mild cardiomegaly. The patient is rotated to the left on today's exam, resulting in distortion of the mediastinal contours and reduced diagnostic sensitivity and specificity for mediastinal pathology. Atherosclerosis in the thoracic aorta.  IMPRESSION: 1. The appearance of the chest suggests congestive heart failure, as above. 2. Atherosclerosis.   Electronically Signed   By: Trudie Reed M.D.   On: 04/11/2015 13:24    Assessment/Plan: Allison Shepard is a 81 y.o. female with COPD, DM admitted with A fib with RVR and fever to 104. CT chest shows possible ground glass opacities vs pulmonary edema.  CT abd pelvis neg. BCx + Grp G strep.  C diff negative. UA unimpressive. FU CXR shows improvement Of note she did have recent admission and also recent steroid injection in spine . Currently much improved.  Only cx + is for the Grp G strep I am concerned for possible metastatic spread of the infection with the bacteremia.  Recommendations Continue PCN IV 12 MU daily  Will need 10 day IV course- has been getting appropriate coverage since admission 8/21 - stop date will be 9/1 Since still in hospital can do without Picc but if gets discharged before IV course completed would dc on extended oral course with  amoxicillin 1000 bid for a total of 2 weeks - stop date 9/5 Thank you very much for the consult. Will follow with you.  FITZGERALD, DAVID   04/11/2015, 2:26 PM

## 2015-04-12 LAB — GLUCOSE, CAPILLARY
GLUCOSE-CAPILLARY: 114 mg/dL — AB (ref 65–99)
GLUCOSE-CAPILLARY: 194 mg/dL — AB (ref 65–99)
Glucose-Capillary: 169 mg/dL — ABNORMAL HIGH (ref 65–99)
Glucose-Capillary: 147 mg/dL — ABNORMAL HIGH (ref 65–99)

## 2015-04-12 NOTE — Progress Notes (Signed)
   04/12/15 0930  Clinical Encounter Type  Visited With Patient  Visit Type Follow-up  Consult/Referral To Chaplain  Spiritual Encounters  Spiritual Needs Prayer;Emotional  Stress Factors  Patient Stress Factors Exhausted;Health changes  Met w/patient to provide pastoral care & prayer. Chap. Shama Monfils G. Ebony

## 2015-04-12 NOTE — Clinical Social Work Note (Signed)
CSW spoke with patient this afternoon and she stated to CSW that she and her family are deciding between [facility], [facility], and [facility]. She stated she will let CSW know something later this afternoon. She is aware that the physician has stated that she will discharge tomorrow.  York Spaniel MSW,LCSW [phone]

## 2015-04-12 NOTE — Consult Note (Signed)
[facility] Cardiology Consultation Note  Patient ID: Allison Shepard, MRN: [id number], DOB/AGE: 1934/05/23 81 y.o. Admit date: 04/06/2015   Date of Consult: 04/12/2015 Primary Physician: Mickey Farber, MD Primary Cardiologist: F ATH  Chief Complaint:  Chief Complaint  Patient presents with  . Altered Mental Status   Reason for Consult: atrial fibrillation with rapid ventricular rate  HPI: 81 y.o. female with known nonvalvular chronic atrial fibrillation for which the patient is on appropriate medication management including beta blocker and calcium channel blocker as well as digoxin. The patient has had continued appropriate medication management for further risk reduction in stroke with is relative. There is been no evidence of significant bleeding complications recently. She also has has high lipid profile for which she is being high intensity cholesterol therapy. Recently she has had significant left lower extremity edema with cellulitis causing atrial fibrillation with rapid ventricular rate due to illness. Intravenous Lasix has helped a great deal to lower lower leg edema and she has improved. Antibiotics were helping as well. Currently has no evidence of myocardial infarction or has significant worsening congestive heart failure although there is possible diastolic dysfunction from rapid ventricular rate currently she has improvements of all parameters from the cardiovascular standpoint  Past Medical History  Diagnosis Date  . COPD (chronic obstructive pulmonary disease)   . Diabetes mellitus without complication   . Atrial fibrillation   . Hypertension   . Hypercholesteremia       Surgical History:  Past Surgical History  Procedure Laterality Date  . Abdominal hysterectomy    . Shoulder fusion surgery Right   . Knee surgery Right   . Hip surgery Right   . Back surgery       Home Meds: Prior to Admission medications   Medication Sig Start Date End Date Taking?  Authorizing Provider  albuterol (PROVENTIL HFA;VENTOLIN HFA) 108 (90 BASE) MCG/ACT inhaler Inhale 2 puffs into the lungs every 6 (six) hours as needed for wheezing or shortness of breath.   Yes Historical Provider, MD  albuterol (PROVENTIL) (2.5 MG/3ML) 0.083% nebulizer solution Take 2.5 mg by nebulization every 6 (six) hours as needed for wheezing or shortness of breath.   Yes Historical Provider, MD  Cholecalciferol (VITAMIN D-3) 1000 UNITS CAPS Take 1,000 Units by mouth daily.   Yes Historical Provider, MD  diltiazem (TIAZAC) 180 MG 24 hr capsule Take 180 mg by mouth daily.   Yes Historical Provider, MD  donepezil (ARICEPT) 5 MG tablet Take 5 mg by mouth at bedtime.   Yes Historical Provider, MD  furosemide (LASIX) 40 MG tablet Take 40 mg by mouth daily.   Yes Historical Provider, MD  insulin aspart (NOVOLOG) 100 UNIT/ML injection Inject 25-30 Units into the skin 3 (three) times daily with meals.    Yes Historical Provider, MD  insulin glargine (LANTUS) 100 UNIT/ML injection Inject 25 Units into the skin 2 (two) times daily.    Yes Historical Provider, MD  lisinopril (PRINIVIL,ZESTRIL) 5 MG tablet Take 5 mg by mouth daily.   Yes Historical Provider, MD  meloxicam (MOBIC) 7.5 MG tablet Take 1 tablet (7.5 mg total) by mouth 2 (two) times daily after a meal. 03/11/15  Yes Tod Persia, MD  metoprolol succinate (TOPROL-XL) 100 MG 24 hr tablet Take 100 mg by mouth daily. Take with or immediately following a meal.   Yes Historical Provider, MD  Multiple Vitamins-Minerals (AIRBORNE PO) Take 1 tablet by mouth daily.    Yes Historical Provider, MD  predniSONE (  STERAPRED UNI-PAK 21 TAB) 10 MG (21) TBPK tablet Take 1 tablet (10 mg total) by mouth daily. Label  & dispense according to the schedule below.  6 tablets day one, then 5 table day 2, then 4 tablets day 3, then 3 tablets day 4, 2 tablets day 5, then 1 tablet day 6, then stop 03/20/15  Yes Auburn Bilberry, MD  rivaroxaban (XARELTO) 20 MG TABS tablet Take  20 mg by mouth daily.   Yes Historical Provider, MD  simvastatin (ZOCOR) 10 MG tablet Take 10 mg by mouth daily.   Yes Historical Provider, MD  Umeclidinium-Vilanterol (ANORO ELLIPTA) 62.5-25 MCG/INH AEPB Inhale 1 puff into the lungs daily.    Yes Historical Provider, MD  zolpidem (AMBIEN) 5 MG tablet Take 5 mg by mouth at bedtime as needed for sleep.   Yes Historical Provider, MD  fluticasone (FLONASE) 50 MCG/ACT nasal spray Place 1 spray into both nostrils daily. 03/20/15   Auburn Bilberry, MD  guaiFENesin (MUCINEX) 600 MG 12 hr tablet Take 1 tablet (600 mg total) by mouth 2 (two) times daily. 03/20/15   Auburn Bilberry, MD    Inpatient Medications:  . antiseptic oral rinse  7 mL Mouth Rinse BID  . atorvastatin  40 mg Oral q1800  . cholecalciferol  1,000 Units Oral Daily  . digoxin  0.125 mg Oral Daily  . diltiazem  180 mg Oral Daily  . fluticasone  1 spray Each Nare Daily  . insulin aspart  0-10 Units Subcutaneous TID WC  . insulin glargine  25 Units Subcutaneous QHS  . lisinopril  5 mg Oral Daily  . mometasone-formoterol  2 puff Inhalation BID  . penicillin g continuous IV infusion  12 Million Units Intravenous Q12H  . rivaroxaban  20 mg Oral Q supper  . zolpidem  5 mg Oral QHS      Allergies:  Allergies  Allergen Reactions  . Hydromorphone Hcl Nausea And Vomiting  . Ibuprofen Other (See Comments)    Pt was told by her MD not to take this medication.    . Mucinex [Guaifenesin Er] Other (See Comments)    Red all over. Felt like i was on fire  . Percocet [Oxycodone-Acetaminophen] Hives    Social History   Social History  . Marital Status: Married    Spouse Name: N/A  . Number of Children: N/A  . Years of Education: N/A   Occupational History  . Not on file.   Social History Main Topics  . Smoking status: Never Smoker   . Smokeless tobacco: Not on file  . Alcohol Use: 0.6 oz/week    0 Standard drinks or equivalent, 1 Glasses of wine per week     Comment: daily  . Drug  Use: No  . Sexual Activity: Not on file   Other Topics Concern  . Not on file   Social History Narrative     No family history on file.   Review of Systems Positive for leg swelling with infection and palpitations Negative for: General:  chills, fever, night sweats or weight changes.  Cardiovascular: PND orthopnea syncope dizziness  Dermatological skin lesions rashes Respiratory: Cough congestion Urologic: Frequent urination urination at night and hematuria Abdominal: negative for nausea, vomiting, diarrhea, bright red blood per rectum, melena, or hematemesis Neurologic: negative for visual changes, and/or hearing changes  All other systems reviewed and are otherwise negative except as noted above.  Labs: No results for input(s): CKTOTAL, CKMB, TROPONINI in the last 72 hours. Lab Results  Component Value Date   WBC 11.0 04/07/2015   HGB 10.4* 04/08/2015   HCT 33.7* 04/07/2015   MCV 102.0* 04/07/2015   PLT 187 04/07/2015    Recent Labs Lab 04/07/15 0520 04/08/15 0551  NA 139  --   K 4.4  --   CL 110  --   CO2 24  --   BUN 26*  --   CREATININE 1.34* 1.18*  CALCIUM 7.9*  --   PROT 5.4*  --   BILITOT 0.9  --   ALKPHOS 55  --   ALT 21  --   AST 22  --   GLUCOSE 104*  --    No results found for: CHOL, HDL, LDLCALC, TRIG No results found for: DDIMER  Radiology/Studies:  Ct Abdomen Pelvis Wo Contrast  04/06/2015   CLINICAL DATA:  Sepsis and altered mental status. Fever with hypoxia.  EXAM: CT CHEST, ABDOMEN AND PELVIS WITHOUT CONTRAST  TECHNIQUE: Multidetector CT imaging of the chest, abdomen and pelvis was performed following the standard protocol without IV contrast.  COMPARISON:  Chest radiograph earlier this day. CT abdomen 02/15/2013  FINDINGS: CT CHEST FINDINGS  Breathing motion artifact partially limits evaluation. Dependent atelectasis at the lung bases. Ground-glass opacities in the right and left lower lobes and left upper lobe, suggesting pulmonary edema.  The heart is enlarged. There are coronary artery calcifications. The thoracic aorta is normal in caliber with mild atherosclerosis. There is no pleural or pericardial effusion. No pathologically enlarged mediastinal adenopathy. Limited assessment for hilar adenopathy given lack of contrast and motion artifact. There are no acute or suspicious osseous abnormalities.  CT ABDOMEN AND PELVIS FINDINGS  Lack of contrast limits assessment. Allowing for this, no evidence of focal hepatic lesion. The unenhanced gallbladder, spleen, adrenal glands, and pancreas are normal. No hydronephrosis. Bilateral renal cysts, incompletely characterized without contrast. Largest is in the upper left kidney.  The stomach is decompressed. There are no dilated or thickened bowel loops. Distal colonic diverticulosis without diverticulitis. The appendix is normal. No free air, free fluid, or intra-abdominal fluid collection.  No retroperitoneal adenopathy. Abdominal aorta is normal in caliber. Moderate atherosclerosis of the abdominal aorta and its branches. There is laxity of the anterior abdominal wall with fat containing ventral abdominal wall hernias.  Within the pelvis the urinary bladder is physiologically distended. The uterus is surgically absent. No gross adnexal mass, detailed pelvic evaluation partially obscured by right hip prosthesis.  Postsurgical change in the lumbar spine. Right hip prosthesis in place. There are no acute or suspicious osseous abnormalities.  IMPRESSION: 1. Dependent atelectasis within both lungs. Multifocal ground-glass opacities, suggestive of pulmonary edema. Breathing motion artifact partially limits evaluation. 2. No acute abnormality in the abdomen/pelvis. 3. Atherosclerosis.  Coronary artery calcifications.   Electronically Signed   By: Rubye Oaks M.D.   On: 04/06/2015 05:17   Dg Chest 1 View  03/16/2015   CLINICAL DATA:  Subsequent encounter for worsening shortness of Breath after emergency room  visit earlier today.  EXAM: CHEST  1 VIEW  COMPARISON:  Earlier the same day  FINDINGS: 1941 hours. Low lung volumes. Assessment a Lung bases limited by patient body habitus and portable technique. There is mild vascular congestion without overt airspace pulmonary edema. Probable atelectasis in the lung bases. The cardio pericardial silhouette is enlarged. Bones are diffusely demineralized. Telemetry leads overlie the chest.  IMPRESSION: Limited study secondary to body habitus and portable technique.  Low volume film with enlargement of the cardiopericardial silhouette and  vascular congestion.   Electronically Signed   By: Kennith Center M.D.   On: 03/16/2015 19:58   Dg Chest 2 View  04/11/2015   CLINICAL DATA:  81 year old female with history of COPD, hypertension and diabetes.  EXAM: CHEST  2 VIEW  COMPARISON:  Chest x-ray 11/02/2014.  FINDINGS: There is cephalization of the pulmonary vasculature and slight indistinctness of the interstitial markings suggestive of mild pulmonary edema. Small bilateral pleural effusions. Mild cardiomegaly. The patient is rotated to the left on today's exam, resulting in distortion of the mediastinal contours and reduced diagnostic sensitivity and specificity for mediastinal pathology. Atherosclerosis in the thoracic aorta.  IMPRESSION: 1. The appearance of the chest suggests congestive heart failure, as above. 2. Atherosclerosis.   Electronically Signed   By: Trudie Reed M.D.   On: 04/11/2015 13:24   Dg Chest 2 View  03/16/2015   CLINICAL DATA:  Progressive shortness of breath for the past month  EXAM: CHEST  2 VIEW  COMPARISON:  February 10, 2014  FINDINGS: Cardiomegaly is stable. Pulmonary vascularity is normal. There is patchy opacity in the left lower lobe. The lungs elsewhere clear. No adenopathy. There is degenerative change in the thoracic spine.  IMPRESSION: Patchy opacity in the left lower lobe, likely atelectasis. Lungs elsewhere clear. Heart enlarged with pulmonary  vascularity within normal limits.   Electronically Signed   By: Bretta Bang III M.D.   On: 03/16/2015 08:04   Ct Head Wo Contrast  04/06/2015   CLINICAL DATA:  Sepsis and altered mental status.  EXAM: CT HEAD WITHOUT CONTRAST  TECHNIQUE: Contiguous axial images were obtained from the base of the skull through the vertex without intravenous contrast.  COMPARISON:  12/08/2011  FINDINGS: No intracranial hemorrhage, mass effect, or midline shift. CSF density space in the left middle cranial fossa likely an arachnoid cyst, unchanged. No associated mass effect. There is periventricular and deep white matter hypodensity, nonspecific, likely related to chronic small vessel ischemia. No hydrocephalus. The basilar cisterns are patent. No evidence of territorial infarct. No intracranial fluid collection. Calvarium is intact. Included paranasal sinuses are well aerated. The previous mastoid air cell opacification has improved with minimal residual opacification of lower left mastoid air cells.  IMPRESSION: No acute intracranial abnormality.   Electronically Signed   By: Rubye Oaks M.D.   On: 04/06/2015 05:21   Ct Chest Wo Contrast  04/06/2015   CLINICAL DATA:  Sepsis and altered mental status. Fever with hypoxia.  EXAM: CT CHEST, ABDOMEN AND PELVIS WITHOUT CONTRAST  TECHNIQUE: Multidetector CT imaging of the chest, abdomen and pelvis was performed following the standard protocol without IV contrast.  COMPARISON:  Chest radiograph earlier this day. CT abdomen 02/15/2013  FINDINGS: CT CHEST FINDINGS  Breathing motion artifact partially limits evaluation. Dependent atelectasis at the lung bases. Ground-glass opacities in the right and left lower lobes and left upper lobe, suggesting pulmonary edema. The heart is enlarged. There are coronary artery calcifications. The thoracic aorta is normal in caliber with mild atherosclerosis. There is no pleural or pericardial effusion. No pathologically enlarged mediastinal  adenopathy. Limited assessment for hilar adenopathy given lack of contrast and motion artifact. There are no acute or suspicious osseous abnormalities.  CT ABDOMEN AND PELVIS FINDINGS  Lack of contrast limits assessment. Allowing for this, no evidence of focal hepatic lesion. The unenhanced gallbladder, spleen, adrenal glands, and pancreas are normal. No hydronephrosis. Bilateral renal cysts, incompletely characterized without contrast. Largest is in the upper left kidney.  The stomach  is decompressed. There are no dilated or thickened bowel loops. Distal colonic diverticulosis without diverticulitis. The appendix is normal. No free air, free fluid, or intra-abdominal fluid collection.  No retroperitoneal adenopathy. Abdominal aorta is normal in caliber. Moderate atherosclerosis of the abdominal aorta and its branches. There is laxity of the anterior abdominal wall with fat containing ventral abdominal wall hernias.  Within the pelvis the urinary bladder is physiologically distended. The uterus is surgically absent. No gross adnexal mass, detailed pelvic evaluation partially obscured by right hip prosthesis.  Postsurgical change in the lumbar spine. Right hip prosthesis in place. There are no acute or suspicious osseous abnormalities.  IMPRESSION: 1. Dependent atelectasis within both lungs. Multifocal ground-glass opacities, suggestive of pulmonary edema. Breathing motion artifact partially limits evaluation. 2. No acute abnormality in the abdomen/pelvis. 3. Atherosclerosis.  Coronary artery calcifications.   Electronically Signed   By: Rubye Oaks M.D.   On: 04/06/2015 05:17   US Venous Img Lower Unilateral Left  04/11/2015   CLINICAL DATA:  Swelling in LEFT lower extremity for 6 days, worsening pain and shortness of breath, history COPD, diabetes mellitus, hypertension, atrial fibrillation, on Xarelto  EXAM: 04/07/2015 LOWER EXTREMITY VENOUS DOPPLER ULTRASOUND  TECHNIQUE: Gray-scale sonography with graded  compression, as well as color Doppler and duplex ultrasound were performed to evaluate the lower extremity deep venous systems from the level of the common femoral vein and including the common femoral, femoral, profunda femoral, popliteal and calf veins including the posterior tibial, peroneal and gastrocnemius veins when visible. The superficial great saphenous vein was also interrogated. Spectral Doppler was utilized to evaluate flow at rest and with distal augmentation maneuvers in the common femoral, femoral and popliteal veins. Calf imaging limited by body habitus.  COMPARISON:  04/07/2015  FINDINGS: Contralateral Common Femoral Vein: Respiratory phasicity is normal and symmetric with the symptomatic side. No evidence of thrombus. Normal compressibility.  Common Femoral Vein: No evidence of thrombus. Normal compressibility, respiratory phasicity and response to augmentation.  Saphenofemoral Junction: No evidence of thrombus. Normal compressibility and flow on color Doppler imaging.  Profunda Femoral Vein: No evidence of thrombus. Normal compressibility and flow on color Doppler imaging.  Femoral Vein: No evidence of thrombus. Normal compressibility, respiratory phasicity and response to augmentation.  Popliteal Vein: No evidence of thrombus. Normal compressibility, respiratory phasicity and response to augmentation.  Calf Veins: No evidence of thrombus. Normal compressibility and flow on color Doppler imaging.  Superficial Great Saphenous Vein: No evidence of thrombus. Normal compressibility and flow on color Doppler imaging.  Venous Reflux:  None.  Other Findings: Hypoechoic collection at LEFT popliteal fossa 3.2 x 1.4 x 2.4 cm question popliteal cyst.  IMPRESSION: No evidence of deep venous thrombosis in LEFT lower extremity.  Probable Baker cyst LEFT popliteal fossa.   Electronically Signed   By: Ulyses Southward M.D.   On: 04/11/2015 18:32   US Venous Img Lower Unilateral Left  04/07/2015   CLINICAL DATA:   Left leg swelling and pain  EXAM: Left LOWER EXTREMITY VENOUS DOPPLER ULTRASOUND  TECHNIQUE: Gray-scale sonography with graded compression, as well as color Doppler and duplex ultrasound were performed to evaluate the lower extremity deep venous systems from the level of the common femoral vein and including the common femoral, femoral, profunda femoral, popliteal and calf veins including the posterior tibial, peroneal and gastrocnemius veins when visible. The superficial great saphenous vein was also interrogated. Spectral Doppler was utilized to evaluate flow at rest and with distal augmentation maneuvers in the common  femoral, femoral and popliteal veins.  COMPARISON:  None.  FINDINGS: Contralateral Common Femoral Vein: Respiratory phasicity is normal and symmetric with the symptomatic side. No evidence of thrombus. Normal compressibility.  Common Femoral Vein: No evidence of thrombus. Normal compressibility, respiratory phasicity and response to augmentation.  Saphenofemoral Junction: No evidence of thrombus. Normal compressibility and flow on color Doppler imaging.  Profunda Femoral Vein: No evidence of thrombus. Normal compressibility and flow on color Doppler imaging.  Femoral Vein: No evidence of thrombus. Normal compressibility, respiratory phasicity and response to augmentation.  Popliteal Vein: No evidence of thrombus. Normal compressibility, respiratory phasicity and response to augmentation.  Calf Veins: No evidence of thrombus. Normal compressibility and flow on color Doppler imaging.  Superficial Great Saphenous Vein: No evidence of thrombus. Normal compressibility and flow on color Doppler imaging.  Venous Reflux:  None.  Other Findings: There is a 5.4 cm fluid lesion in the popliteal fossa consistent with a popliteal cyst.  IMPRESSION: No evidence of deep venous thrombosis.  Left popliteal cyst.   Electronically Signed   By: Alcide Clever M.D.   On: 04/07/2015 15:16   Dg Chest Port 1  View  04/07/2015   CLINICAL DATA:  Shortness of breath.  EXAM: PORTABLE CHEST - 1 VIEW  COMPARISON:  04/06/2015.  CT 04/06/2015.  FINDINGS: Mediastinum hilar structures normal. Cardiomegaly with normal pulmonary vascularity. Interim clearing of pulmonary venous congestion and interstitial prominence suggesting clear clearing congestive heart failure. Mild basilar atelectasis. Small left pleural effusion cannot be excluded.  IMPRESSION: 1. Interim clearing of pulmonary venous congestion interstitial prominence suggesting clearing congestive heart failure. Small left pleural effusion may be present. 2. Low lung volumes with mild bibasilar atelectasis.   Electronically Signed   By: Maisie Fus  Register   On: 04/07/2015 07:57   Dg Chest Port 1 View  04/06/2015   CLINICAL DATA:  Altered mental status, tachycardia.  EXAM: PORTABLE CHEST - 1 VIEW  COMPARISON:  03/27/2015  FINDINGS: Lung volumes remain low. Cardiomediastinal contours are unchanged with cardiomegaly. Vascular congestion is again seen. There is new perivascular haziness suggestive of pulmonary edema. Limited lung base assessment due to soft tissue attenuation from body habitus. No pneumothorax. Degenerative change seen in both shoulders.  IMPRESSION: Development perivascular haziness suggesting pulmonary edema. Cardiomegaly and vascular congestion are again seen.   Electronically Signed   By: Rubye Oaks M.D.   On: 04/06/2015 04:22    EKG: Atrial fibrillation with rapid ventricular rate and nonspecific ST and T-wave changes  Weights: Filed Weights   04/10/15 0519 04/11/15 0522 04/12/15 0508  Weight: 246 lb 14.4 oz (111.993 kg) 251 lb 4.8 oz (113.989 kg) 233 lb 8 oz (105.915 kg)     Physical Exam: Blood pressure 141/93, pulse 120, temperature 98.6 F (37 C), temperature source Oral, resp. rate 19, height 5\' 5"  (1.651 m), weight 233 lb 8 oz (105.915 kg), SpO2 93 %. Body mass index is 38.86 kg/(m^2). General: Well developed, well nourished,  in no acute distress. Head eyes ears nose throat: Normocephalic, atraumatic, sclera non-icteric, no xanthomas, nares are without discharge. No apparent thyromegaly and/or mass  Lungs: Normal respiratory effort.  Few wheezes, no rales, no rhonchi.  Heart: Irregular with normal S1 S2. Apical murmur gallop, no rub, PMI is normal size and placement, carotid upstroke normal without bruit, jugular venous pressure is normal Abdomen: Soft, non-tender, non-distended with normoactive bowel sounds. No hepatomegaly. No rebound/guarding. No obvious abdominal masses. Abdominal aorta is normal size without bruit Extremities: 1+ edema. no  cyanosis, no clubbing, positive ulcers  Peripheral : 2+ bilateral upper extremity pulses, 2+ bilateral femoral pulses, 2+ bilateral dorsal pedal pulse Neuro: Alert and oriented. No facial asymmetry. No focal deficit. Moves all extremities spontaneously. Musculoskeletal: Normal muscle tone without kyphosis Psych:  Responds to questions appropriately with a normal affect.    Assessment: 81 year old female with chronic nonvalvular atrial fibrillation with controlled ventricular rate now worsened due to infection and illness to rapid ventricular rate slightly improved since antibiotics and lower extremity edema have been treated with appropriate medications. There is no current evidence of significant congestive heart failure and or myocardial infarction at this time  Plan: 1. Continue current medical regimen for heart rate control of atrial fibrillation 2. Continue anticoagulation was relatively without change 3. Aggressive antibiotics for treatment of left lower cellulitis 4. Lasix as needed for lower extremity edema 5. No additional diagnostic testing from the cardiac standpoint at this time  Signed, Lamar Blinks M.D. [facility] Cardiology 04/12/2015, 11:55 AM

## 2015-04-12 NOTE — Progress Notes (Signed)
[facility] - [place] at [facility]   PATIENT NAME: Allison Shepard    MR#:  [id number]  DATE OF BIRTH:  1934/05/02  SUBJECTIVE:  CHIEF COMPLAINT:   Chief Complaint  Patient presents with  . Altered Mental Status   patient is reporting left lower extremity redness from yesterday .denies any palpitations or shortness of breath today. Feeling better Patient is reporting significant A. fib with RVR recently with minimal exertion. Denies any chest pain REVIEW OF SYSTEMS:  Review of Systems  Constitutional: Negative for fever, weight loss, malaise/fatigue and diaphoresis.  HENT: Negative for ear discharge, ear pain, hearing loss, nosebleeds, sore throat and tinnitus.   Eyes: Negative for blurred vision and pain.  Respiratory: Positive for cough and shortness of breath. Negative for hemoptysis and wheezing.   Cardiovascular: Positive for palpitations. Negative for chest pain, orthopnea and leg swelling.  Gastrointestinal: Negative for heartburn, nausea, vomiting, abdominal pain, diarrhea, constipation and blood in stool.  Genitourinary: Negative for dysuria, urgency and frequency.  Musculoskeletal: Negative for myalgias and back pain.  Skin: Negative for itching and rash.  Neurological: Negative for dizziness, tingling, tremors, focal weakness, seizures, weakness and headaches.  Psychiatric/Behavioral: Negative for depression. The patient is not nervous/anxious.    DRUG ALLERGIES:   Allergies  Allergen Reactions  . Hydromorphone Hcl Nausea And Vomiting  . Ibuprofen Other (See Comments)    Pt was told by her MD not to take this medication.    . Mucinex [Guaifenesin Er] Other (See Comments)    Red all over. Felt like i was on fire  . Percocet [Oxycodone-Acetaminophen] Hives   VITALS:  Blood pressure 102/70, pulse 95, temperature 98.3 F (36.8 C), temperature source Oral, resp. rate 20, height 5\' 5"  (1.651 m), weight 105.915 kg (233 lb 8 oz), SpO2 95  %. PHYSICAL EXAMINATION:  Physical Exam  Constitutional: She is oriented to person, place, and time and well-developed, well-nourished, and in no distress.  HENT:  Head: Normocephalic and atraumatic.  Eyes: Conjunctivae and EOM are normal. Pupils are equal, round, and reactive to light.  Neck: Normal range of motion. Neck supple. No tracheal deviation present. No thyromegaly present.  Cardiovascular: Normal rate, regular rhythm and normal heart sounds.   Pulmonary/Chest: Breath sounds normal. Tachypnea noted. She is in respiratory distress. She has no wheezes. She exhibits no tenderness.  Abdominal: Soft. Bowel sounds are normal. She exhibits no distension. There is no tenderness.  Musculoskeletal: Normal range of motion. She exhibits edema.  Neurological: She is alert and oriented to person, place, and time. No cranial nerve deficit.  Skin: Skin is warm and dry. No rash noted. No erythema.  Psychiatric: Affect normal. Her mood appears anxious.   LABORATORY PANEL:   CBC  Recent Labs Lab 04/07/15 0520 04/08/15 0551  WBC 11.0  --   HGB 11.3* 10.4*  HCT 33.7*  --   PLT 187  --    ------------------------------------------------------------------------------------------------------------------ Chemistries   Recent Labs Lab 04/07/15 0520 04/08/15 0551  NA 139  --   K 4.4  --   CL 110  --   CO2 24  --   GLUCOSE 104*  --   BUN 26*  --   CREATININE 1.34* 1.18*  CALCIUM 7.9*  --   AST 22  --   ALT 21  --   ALKPHOS 55  --   BILITOT 0.9  --    ASSESSMENT AND PLAN:  * Septic shock: Present on admission.  Blood cultures growing Streptococcus  G - considering her recent admission and also recent steroid injection in spine that she may have a nosocomial infection. Discussed with Dr. Sampson Goon as she is not wanting PICC line and will change her long-term IV antibiotics from penicillin G to rosephine 1 g IV every 12 hours in view of her new  left lower extending to cellulitis. we  will  her IV antibiotics while in the hospital and switch her to oral abx  until September 5 if she gets discharged with outpatient follow-up with Dr. Sampson Goon in the office.  * New Onset of left lower extremity cellulitis-antibiotics are changed to IV Rocephin 1 g every 12 hours Will follow up with Dr. Sampson Goon regarding discharge antibiotics.lower extremity Doppler negative for DVT.     * Rapid atrial fibrillation: continue long-acting oral Cardizem for heart rate control .continue Xarelto for anticoagulation. Added digoxin for persistent A. fib with RVR with minimal exertion  will follow up with Dr. Lady Gary  * Diabetes mellitus: continue current regimen.  Appreciate diabetic nurse coordinator input. on sliding scale insulin   * COPD: Continue her home inhalers and monitor on nasal cannula oxygen consider BiPAP if needed. Appreciate pulmonary input. We will add Symbicort and albuterol inhaler as needed basis at the time of discharge Pulmonology signed off  * Anxiety and insomnia: Will try Xanax, also Ambien at bedtime  * Disposition-to rehabilitation facility in 1-2 days, follow-up with case management   All the records are reviewed and case discussed with Care Management/Social Worker. Management plans discussed with the patient, family and they are in agreement.  Physical therapy recommends rehabilitation.    CODE STATUS: Full code  TOTAL TIME TAKING CARE OF THIS PATIENT: 35 minutes.   More than 50% of the time was spent in counseling/coordination of care: YES (discussed with daughters and husband at bedside)  POSSIBLE D/C EARLY NEXT WEEK/MONDAY, DEPENDING ON CLINICAL CONDITION.  Likely need rehabilitation   Ramonita Lab M.D on 04/12/2015 at 12:33 PM  Between 7am to 6pm - Pager - [phone]  After 6pm go to [email or web address] - password [email or web address] [facility]  [facility]  Office  [phone]  CC: Primary care physician; Mickey Farber, MD

## 2015-04-13 LAB — GLUCOSE, CAPILLARY
GLUCOSE-CAPILLARY: 258 mg/dL — AB (ref 65–99)
GLUCOSE-CAPILLARY: 199 mg/dL — AB (ref 65–99)
Glucose-Capillary: 206 mg/dL — ABNORMAL HIGH (ref 65–99)
Glucose-Capillary: 246 mg/dL — ABNORMAL HIGH (ref 65–99)

## 2015-04-13 MED ORDER — METOPROLOL TARTRATE 25 MG PO TABS
25.0000 mg | ORAL_TABLET | Freq: Two times a day (BID) | ORAL | Status: DC
  Administered 2015-04-13 – 2015-04-14 (×2): 25 mg via ORAL
  Filled 2015-04-13 (×2): qty 1

## 2015-04-13 MED ORDER — DILTIAZEM HCL 60 MG PO TABS
60.0000 mg | ORAL_TABLET | Freq: Once | ORAL | Status: AC
  Administered 2015-04-13: 60 mg via ORAL
  Filled 2015-04-13: qty 1

## 2015-04-13 MED ORDER — DILTIAZEM HCL ER COATED BEADS 240 MG PO CP24
240.0000 mg | ORAL_CAPSULE | Freq: Every day | ORAL | Status: DC
  Administered 2015-04-14: 240 mg via ORAL
  Filled 2015-04-13: qty 1

## 2015-04-13 NOTE — Care Management (Signed)
Spoke with patient's daughter Verlon Au who asks staff not to discuss any other treatment options with patient without discussing with and and or father first.  Anticipate discharge tomorrow to [facility]

## 2015-04-13 NOTE — Clinical Social Work Placement (Signed)
   CLINICAL SOCIAL WORK PLACEMENT  NOTE  Date:  04/13/2015  Patient Details  Name: Allison Shepard MRN: [id number] Date of Birth: 04/15/1934  Clinical Social Work is seeking post-discharge placement for this patient at the Skilled  Nursing Facility level of care (*CSW will initial, date and re-position this form in  chart as items are completed):  Yes   Patient/family provided with [facility] Clinical Social Work Department's list of facilities offering this level of care within the geographic area requested by the patient (or if unable, by the patient's family).  Yes   Patient/family informed of their freedom to choose among providers that offer the needed level of care, that participate in Medicare, Medicaid or managed care program needed by the patient, have an available bed and are willing to accept the patient.  Yes   Patient/family informed of [facility] ownership interest in [facility] and [facility], as well as of the fact that they are under no obligation to receive care at these facilities.  PASRR submitted to EDS on       PASRR number received on       Existing PASRR number confirmed on 04/10/15     FL2 transmitted to all facilities in geographic area requested by pt/family on 04/10/15     FL2 transmitted to all facilities within larger geographic area on       Patient informed that his/her managed care company has contracts with or will negotiate with certain facilities, including the following:        Yes   Patient/family informed of bed offers received.  Patient chooses bed at [facility] [place], [facility]     Physician recommends and patient chooses bed at      Patient to be transferred to [facility] on 04/13/15.  Patient to be transferred to facility by  [facility])     Patient family notified on 04/13/15 of transfer.  Name of family member notified:  Daughter       PHYSICIAN Please prepare priority discharge summary, including medications     Additional Comment:    _______________________________________________ Chauncy Passy, LCSW 04/13/2015, 11:44 AM

## 2015-04-13 NOTE — Care Management (Signed)
Patient's discharge is being postponed due to inability to get heart rate under better control.  Anticipate discharge to [facility] 8/30.  Discussed the concept of ltac with patient and would like to discuss with her husband.  Gave CM permission to discuss with her daughter Verlon Au but will not give permission to have her medical record to be reviewed to determine if meets ltac criteria.  Does not want her husband to have to travel.  Patient, if medically stable will discharge within next 24 hours.

## 2015-04-13 NOTE — Progress Notes (Signed)
Pt. Had a 2.17 second pause on telemetry, pt. assessed in no acute distress, vital signs stable, pt. Was asleep aroused easily by calling her name. No signs or c/o of discomfort. Will continue to monitor pt.

## 2015-04-13 NOTE — Clinical Social Work Note (Signed)
CSW spoke to pt's daughter to notify her that pt would not DC today.  CSW has also notified facility.

## 2015-04-13 NOTE — Progress Notes (Signed)
[facility] - [place] at [facility]   PATIENT NAME: Allison Shepard    MR#:  [id number]  DATE OF BIRTH:  Aug 11, 1934  SUBJECTIVE:  CHIEF COMPLAINT:   Chief Complaint  Patient presents with  . Altered Mental Status   patient's left lower extremity redness is improving clinically . Patient is becoming tachycardic even while resting. Denies any palpitations or chest pain. REVIEW OF SYSTEMS:  Review of Systems  Constitutional: Negative for fever, weight loss, malaise/fatigue and diaphoresis.  HENT: Negative for ear discharge, ear pain, hearing loss, nosebleeds, sore throat and tinnitus.   Eyes: Negative for blurred vision and pain.  Respiratory: Negative for cough, hemoptysis, shortness of breath and wheezing.   Cardiovascular: Positive for palpitations. Negative for chest pain, orthopnea and leg swelling.  Gastrointestinal: Negative for heartburn, nausea, vomiting, abdominal pain, diarrhea, constipation and blood in stool.  Genitourinary: Negative for dysuria, urgency and frequency.  Musculoskeletal: Negative for myalgias and back pain.  Skin: Negative for itching and rash.  Neurological: Negative for dizziness, tingling, tremors, focal weakness, seizures, weakness and headaches.  Psychiatric/Behavioral: Negative for depression. The patient is not nervous/anxious.    DRUG ALLERGIES:   Allergies  Allergen Reactions  . Hydromorphone Hcl Nausea And Vomiting  . Ibuprofen Other (See Comments)    Pt was told by her MD not to take this medication.    . Mucinex [Guaifenesin Er] Other (See Comments)    Red all over. Felt like i was on fire  . Percocet [Oxycodone-Acetaminophen] Hives   VITALS:  Blood pressure 101/62, pulse 112, temperature 98 F (36.7 C), temperature source Oral, resp. rate 18, height  (1.651 m), weight 106.397 kg (234 lb 9 oz), SpO2 91 %. PHYSICAL EXAMINATION:  Physical Exam  Constitutional: She is oriented to person, place, and time and  well-developed, well-nourished, and in no distress.  HENT:  Head: Normocephalic and atraumatic.  Eyes: Conjunctivae and EOM are normal. Pupils are equal, round, and reactive to light.  Neck: Normal range of motion. Neck supple. No tracheal deviation present. No thyromegaly present.  Cardiovascular: Normal rate, regular rhythm and normal heart sounds.   Pulmonary/Chest: Breath sounds normal. Tachypnea noted. She is in respiratory distress. She has no wheezes. She exhibits no tenderness.  Abdominal: Soft. Bowel sounds are normal. She exhibits no distension. There is no tenderness.  Musculoskeletal: Normal range of motion. She exhibits edema.  Neurological: She is alert and oriented to person, place, and time. No cranial nerve deficit.  Skin: Skin is warm and dry. No rash noted. No erythema.  Psychiatric: Affect normal. Her mood appears anxious.   LABORATORY PANEL:   CBC  Recent Labs Lab 04/07/15 0520 04/08/15 0551  WBC 11.0  --   HGB 11.3* 10.4*  HCT 33.7*  --   PLT 187  --    ------------------------------------------------------------------------------------------------------------------ Chemistries   Recent Labs Lab 04/07/15 0520 04/08/15 0551  NA 139  --   K 4.4  --   CL 110  --   CO2 24  --   GLUCOSE 104*  --   BUN 26*  --   CREATININE 1.34* 1.18*  CALCIUM 7.9*  --   AST 22  --   ALT 21  --   ALKPHOS 55  --   BILITOT 0.9  --    ASSESSMENT AND PLAN:  * Septic shock: Present on admission but clinically improved   Blood cultures growing Streptococcus G - considering her recent admission and also recent steroid injection in  spine that she may have a nosocomial infection.  Discussed with Dr. Sampson Goon as she is not wanting PICC line and will change her long-term IV antibiotics from penicillin G to rocephine 1 g IV every 12 hours in view of her new  left lower extending to cellulitis. This has been changed back to penicillin G as cellulitis is improving with lower  extremity elevation.  Appreciate ID recommendations Recommending to discharge patient with Amoxil 1000 mg by mouth twice a day until September 5  * New Onset of left lower extremity cellulitis-antibiotics  IV Rocephin 1 g every 12 hours is discontinued Plan is to continue giving penicillin G Will follow up with Dr. Sampson Goon regarding discharge antibiotics.lower extremity Doppler negative for DVT.     * Rapid atrial fibrillation: continue long-acting oral Cardizem dose is increased to 240 mg for better heart rate control .continue Xarelto for anticoagulation. Added digoxin for persistent A. fib with RVR with minimal exertion  we will add metoprolol 25 mg if her blood pressure permits  will follow up with Dr. Marilynne Drivers   * Diabetes mellitus: continue current regimen.  Appreciate diabetic nurse coordinator input. on sliding scale insulin   * COPD: Continue her home inhalers and monitor on nasal cannula oxygen consider BiPAP if needed. Appreciate pulmonary input. We will add Symbicort and albuterol inhaler as needed basis at the time of discharge Pulmonology signed off  * Anxiety and insomnia: Will try Xanax, also Ambien at bedtime  * Disposition-to rehabilitation facility in 1-2 days, follow-up with case management   All the records are reviewed and case discussed with Care Management/Social Worker. Management plans discussed with the patient, family and they are in agreement.  Physical therapy recommends rehabilitation.    CODE STATUS: Full code  TOTAL TIME TAKING CARE OF THIS PATIENT: 35 minutes.   More than 50% of the time was spent in counseling/coordination of care: YES (discussed with daughters and husband at bedside)  POSSIBLE D/C EARLY NEXT WEEK/MONDAY, DEPENDING ON CLINICAL CONDITION.  Likely need rehabilitation   Ramonita Lab M.D on 04/13/2015 at 3:52 PM  Between 7am to 6pm - Pager - [phone]  After 6pm go to [email or web address] - password [email or web address]  [facility]  [place] [facility]  Office  [phone]  CC: Primary care physician; Mickey Farber, MD

## 2015-04-13 NOTE — Progress Notes (Deleted)
Dr. Amado Coe paged regarding discharge. Patients HR is 80s-90s and sustaining there. Per Dr. Amado Coe, proceed with discharge.

## 2015-04-13 NOTE — Progress Notes (Addendum)
RN spoke with Dr. Amado Coe in person regarding patients vitals and HR sustaining in 120s-130s, pt resting in bed. MD acknowledged, RN instructed to hold metoprolol. MD to postpone discharge.  Filed Vitals:   04/13/15 1144  BP: 101/62  Pulse: 112  Temp: 98 F (36.7 C)  Resp: 18   RN also told Dr. Amado Coe that no lab work has been drawn for the past 4-5 days. MD is aware.

## 2015-04-13 NOTE — Care Management Important Message (Signed)
Important Message  Patient Details  Name: Allison Shepard MRN: [id number] Date of Birth: 11-05-1933   Medicare Important Message Given:  Yes-third notification given    [name] 04/13/2015, 10:49 AM

## 2015-04-13 NOTE — Progress Notes (Signed)
[facility] CLINIC INFECTIOUS DISEASE PROGRESS NOTE Date of Admission:  04/06/2015     ID: Allison Shepard is a 81 y.o. female with Group G Strep bacteremia  Active Problems:   Acute respiratory distress   Sepsis   Diaphragmatic hernia   Restrictive lung disease secondary to obesity   Pneumonia   COPD exacerbation   Other emphysema   Morbid obesity   Debility   Subjective: Had LLE cellulitis so was supposed to be changed pcn to ceftriaxone but was not changed. However the area is much better with elevation.  ROS  Eleven systems are reviewed and negative except per hpi  Medications:  Antibiotics Given (last 72 hours)    Date/Time Action Medication Dose Rate   04/10/15 2139 Given   penicillin G potassium 12 Million Units in dextrose 5 % 500 mL continuous infusion 12 Million Units 41.7 mL/hr   04/11/15 0852 Given   penicillin G potassium 12 Million Units in dextrose 5 % 500 mL continuous infusion 12 Million Units 41.7 mL/hr   04/11/15 2054 Given   penicillin G potassium 12 Million Units in dextrose 5 % 500 mL continuous infusion 12 Million Units 41.7 mL/hr   04/12/15 0835 Given   penicillin G potassium 12 Million Units in dextrose 5 % 500 mL continuous infusion 12 Million Units 41.7 mL/hr   04/12/15 2135 Given   penicillin G potassium 12 Million Units in dextrose 5 % 500 mL continuous infusion 12 Million Units 41.7 mL/hr   04/13/15 1191 Given   penicillin G potassium 12 Million Units in dextrose 5 % 500 mL continuous infusion 12 Million Units 41.7 mL/hr     . antiseptic oral rinse  7 mL Mouth Rinse BID  . atorvastatin  40 mg Oral q1800  . cholecalciferol  1,000 Units Oral Daily  . digoxin  0.125 mg Oral Daily  . [START ON 04/14/2015] diltiazem  240 mg Oral Daily  . fluticasone  1 spray Each Nare Daily  . insulin aspart  0-10 Units Subcutaneous TID WC  . insulin glargine  25 Units Subcutaneous QHS  . lisinopril  5 mg Oral Daily  . metoprolol tartrate  25 mg Oral BID  .  mometasone-formoterol  2 puff Inhalation BID  . penicillin g continuous IV infusion  12 Million Units Intravenous Q12H  . rivaroxaban  20 mg Oral Q supper  . zolpidem  5 mg Oral QHS    Objective: Vital signs in last 24 hours: Temp:  [97.7 F (36.5 C)-98.2 F (36.8 C)] 98 F (36.7 C) (08/29 1144) Pulse Rate:  [85-116] 112 (08/29 1144) Resp:  [18-22] 18 (08/29 1144) BP: (101-124)/(62-98) 101/62 mmHg (08/29 1144) SpO2:  [91 %-99 %] 91 % (08/29 1144) Weight:  [106.397 kg (234 lb 9 oz)] 106.397 kg (234 lb 9 oz) (08/29 0500) Constitutional: Obese, ill appearing confused HENT: [place]/AT, PERRLA, no scleral icterus Mouth/Throat: Oropharynx is clear and dry . No oropharyngeal exudate.  Cardiovascular: Tachy irr  Pulmonary/Chest: poor air movement, rhonchi Neck = supple, no nuchal rigidity Abdominal: Soft. Bowel sounds are normal. exhibits no distension. There is no tenderness.  Lymphadenopathy: no cervical adenopathy. No axillary adenopathy Neurological: confused, slow metation Skin: Skin is warm and dry. Mild erythem RLE Psychiatric: a normal mood and affect.   Lab Results No results for input(s): WBC, HGB, HCT, NA, K, CL, CO2, BUN, CREATININE, GLU in the last 72 hours.  Invalid input(s): PLATELETS  Microbiology: Results for orders placed or performed during the hospital encounter of  04/06/15  Blood Culture (routine x 2)     Status: None   Collection Time: 04/06/15  3:49 AM  Result Value Ref Range Status   Specimen Description BLOOD RIGHT WRIST  Final   Special Requests BOTTLES DRAWN AEROBIC AND ANAEROBIC 4CC  Final   Culture  Setup Time   Final    GRAM POSITIVE COCCI IN BOTH AEROBIC AND ANAEROBIC BOTTLES CRITICAL RESULT CALLED TO, READ BACK BY AND VERIFIED WITH: PAM MYERS ON 04/06/15 AT 1730 BY TB CONFIRMED BY TB/CAF    Culture   Final    STREPTOCOCCUS GROUP G IN BOTH AEROBIC AND ANAEROBIC BOTTLES There is no known Penicillin Resistant Beta Streptococcus in the [place] For  patients that are Penicillin-allergic, Erythromycin is 85-94% susceptible, and Clindamycin is 80% susceptible.  Contact Microbiology within 7 days if sensitivity testing is  required.      Report Status 04/08/2015 FINAL  Final   Organism ID, Bacteria STREPTOCOCCUS GROUP G  Final      Susceptibility   Streptococcus group g - MIC*    CLINDAMYCIN SENSITIVE Sensitive     ERYTHROMYCIN SENSITIVE Sensitive     AMPICILLIN SENSITIVE Sensitive     * STREPTOCOCCUS GROUP G  Blood Culture (routine x 2)     Status: None   Collection Time: 04/06/15  3:49 AM  Result Value Ref Range Status   Specimen Description BLOOD RIGHT ASSIST CONTROL  Final   Special Requests BOTTLES DRAWN AEROBIC AND ANAEROBIC 5CC  Final   Culture  Setup Time   Final    GRAM POSITIVE COCCI IN BOTH AEROBIC AND ANAEROBIC BOTTLES CRITICAL VALUE NOTED.  VALUE IS CONSISTENT WITH PREVIOUSLY REPORTED AND CALLED VALUE.    Culture   Final    STREPTOCOCCUS GROUP G IN BOTH AEROBIC AND ANAEROBIC BOTTLES There is no known Penicillin Resistant Beta Streptococcus in the [place] For patients that are Penicillin-allergic, Erythromycin is 85-94% susceptible, and Clindamycin is 80% susceptible.  Contact Microbiology within 7 days if sensitivity testing is  required.      Report Status 04/08/2015 FINAL  Final   Organism ID, Bacteria STREPTOCOCCUS GROUP G  Final      Susceptibility   Streptococcus group g - MIC*    ERYTHROMYCIN SENSITIVE Sensitive     PENICILLIN SENSITIVE Sensitive     CLINDAMYCIN SENSITIVE Sensitive     * STREPTOCOCCUS GROUP G  Urine culture     Status: None   Collection Time: 04/06/15  3:49 AM  Result Value Ref Range Status   Specimen Description URINE, RANDOM  Final   Special Requests NONE  Final   Culture 30,000 COLONIES/mL AEROCOCCUS URINAE  Final   Report Status 04/09/2015 FINAL  Final  MRSA PCR Screening     Status: None   Collection Time: 04/06/15 12:15 PM  Result Value Ref Range Status   MRSA by PCR NEGATIVE  NEGATIVE Final    Comment:        The GeneXpert MRSA Assay (FDA approved for NASAL specimens only), is one component of a comprehensive MRSA colonization surveillance program. It is not intended to diagnose MRSA infection nor to guide or monitor treatment for MRSA infections.   C difficile quick scan w PCR reflex     Status: None   Collection Time: 04/06/15  3:40 PM  Result Value Ref Range Status   C Diff antigen NEGATIVE NEGATIVE Final   C Diff toxin NEGATIVE NEGATIVE Final   C Diff interpretation Negative for C. difficile  Final  Culture, sputum-assessment     Status: None   Collection Time: 04/06/15  3:45 PM  Result Value Ref Range Status   Specimen Description SPUTUM  Final   Special Requests NONE  Final   Sputum evaluation   Final    Sputum specimen not acceptable for testing.  Please recollect.     Report Status 04/06/2015 FINAL  Final     Studies/Results: US Venous Img Lower Unilateral Left  04/11/2015   CLINICAL DATA:  Swelling in LEFT lower extremity for 6 days, worsening pain and shortness of breath, history COPD, diabetes mellitus, hypertension, atrial fibrillation, on Xarelto  EXAM: 04/07/2015 LOWER EXTREMITY VENOUS DOPPLER ULTRASOUND  TECHNIQUE: Gray-scale sonography with graded compression, as well as color Doppler and duplex ultrasound were performed to evaluate the lower extremity deep venous systems from the level of the common femoral vein and including the common femoral, femoral, profunda femoral, popliteal and calf veins including the posterior tibial, peroneal and gastrocnemius veins when visible. The superficial great saphenous vein was also interrogated. Spectral Doppler was utilized to evaluate flow at rest and with distal augmentation maneuvers in the common femoral, femoral and popliteal veins. Calf imaging limited by body habitus.  COMPARISON:  04/07/2015  FINDINGS: Contralateral Common Femoral Vein: Respiratory phasicity is normal and symmetric with the  symptomatic side. No evidence of thrombus. Normal compressibility.  Common Femoral Vein: No evidence of thrombus. Normal compressibility, respiratory phasicity and response to augmentation.  Saphenofemoral Junction: No evidence of thrombus. Normal compressibility and flow on color Doppler imaging.  Profunda Femoral Vein: No evidence of thrombus. Normal compressibility and flow on color Doppler imaging.  Femoral Vein: No evidence of thrombus. Normal compressibility, respiratory phasicity and response to augmentation.  Popliteal Vein: No evidence of thrombus. Normal compressibility, respiratory phasicity and response to augmentation.  Calf Veins: No evidence of thrombus. Normal compressibility and flow on color Doppler imaging.  Superficial Great Saphenous Vein: No evidence of thrombus. Normal compressibility and flow on color Doppler imaging.  Venous Reflux:  None.  Other Findings: Hypoechoic collection at LEFT popliteal fossa 3.2 x 1.4 x 2.4 cm question popliteal cyst.  IMPRESSION: No evidence of deep venous thrombosis in LEFT lower extremity.  Probable Baker cyst LEFT popliteal fossa.   Electronically Signed   By: Ulyses Southward M.D.   On: 04/11/2015 18:32    Assessment/Plan: TOMEKA KANTNER is a 81 y.o. female with COPD, DM admitted with A fib with RVR and fever to 104. CT chest shows possible ground glass opacities vs pulmonary edema.  CT abd pelvis neg. BCx + Grp G strep.  C diff negative. UA unimpressive. FU CXR shows improvement Currently much improved. Only cx + is for the Grp G strep Had some LLE cellulitis but much imprved with elevation   Recommendations Continue PCN IV 12 MU daily until dc  No need to change to ceftriaxone at this point as cellulitis is improving with elevation At dc can give oral course with  amoxicillin 1000 bid for a total of 2 weeks - stop date 9/5  Thank you very much for the consult. Will follow with you.  Thu Baggett   04/13/2015, 3:30 PM

## 2015-04-13 NOTE — Progress Notes (Signed)
[facility] Cardiology [facility] Encounter Note  Patient: Allison Shepard / Admit Date: 04/06/2015 / Date of Encounter: 04/13/2015, 8:49 AM   Subjective: No more shortness of breath. Lower extremity edema significantly improved cellulitis improved  Review of Systems: Positive for: Mild edema Negative for: Vision change, hearing change, syncope, dizziness, nausea, vomiting,diarrhea, bloody stool, stomach pain, cough, congestion, diaphoresis, urinary frequency, urinary pain,skin lesions, skin rashes Others previously listed  Objective: Telemetry: Atrial fibrillation Physical Exam: Blood pressure 121/81, pulse 116, temperature 97.7 F (36.5 C), temperature source Oral, resp. rate 22, height 5\' 5"  (1.651 m), weight 234 lb 9 oz (106.397 kg), SpO2 99 %. Body mass index is 39.03 kg/(m^2). General: Well developed, well nourished, in no acute distress. Head: Normocephalic, atraumatic, sclera non-icteric, no xanthomas, nares are without discharge. Neck: No apparent masses Lungs: Normal respirations with few wheezes, no rhonchi, no rales , few crackles   Heart: Irregular rate and rhythm, normal S1 S2, no murmur, no rub, no gallop, PMI is normal size and placement, carotid upstroke normal without bruit, jugular venous pressure normal Abdomen: Soft, non-tender, non-distended with normoactive bowel sounds. No hepatosplenomegaly. Abdominal aorta is normal size without bruit Extremities: Trace edema, no clubbing, no cyanosis, positive ulcers,  Peripheral: 2+ radial, 2+ femoral, 2+ dorsal pedal pulses Neuro: Alert and oriented. Moves all extremities spontaneously. Psych:  Responds to questions appropriately with a normal affect.   Intake/Output Summary (Last 24 hours) at 04/13/15 0849 Last data filed at 04/13/15 0556  Gross per 24 hour  Intake    980 ml  Output    700 ml  Net    280 ml    Inpatient Medications:  . antiseptic oral rinse  7 mL Mouth Rinse BID  . atorvastatin  40 mg Oral  q1800  . cholecalciferol  1,000 Units Oral Daily  . digoxin  0.125 mg Oral Daily  . diltiazem  180 mg Oral Daily  . fluticasone  1 spray Each Nare Daily  . insulin aspart  0-10 Units Subcutaneous TID WC  . insulin glargine  25 Units Subcutaneous QHS  . lisinopril  5 mg Oral Daily  . mometasone-formoterol  2 puff Inhalation BID  . penicillin g continuous IV infusion  12 Million Units Intravenous Q12H  . rivaroxaban  20 mg Oral Q supper  . zolpidem  5 mg Oral QHS   Infusions:    Labs: No results for input(s): NA, K, CL, CO2, GLUCOSE, BUN, CREATININE, CALCIUM, MG, PHOS in the last 72 hours. No results for input(s): AST, ALT, ALKPHOS, BILITOT, PROT, ALBUMIN in the last 72 hours. No results for input(s): WBC, NEUTROABS, HGB, HCT, MCV, PLT in the last 72 hours. No results for input(s): CKTOTAL, CKMB, TROPONINI in the last 72 hours. Invalid input(s): POCBNP No results for input(s): HGBA1C in the last 72 hours.   Weights: Filed Weights   04/11/15 0522 04/12/15 0508 04/13/15 0500  Weight: 251 lb 4.8 oz (113.989 kg) 233 lb 8 oz (105.915 kg) 234 lb 9 oz (106.397 kg)     Radiology/Studies:  Ct Abdomen Pelvis Wo Contrast  04/06/2015   CLINICAL DATA:  Sepsis and altered mental status. Fever with hypoxia.  EXAM: CT CHEST, ABDOMEN AND PELVIS WITHOUT CONTRAST  TECHNIQUE: Multidetector CT imaging of the chest, abdomen and pelvis was performed following the standard protocol without IV contrast.  COMPARISON:  Chest radiograph earlier this day. CT abdomen 02/15/2013  FINDINGS: CT CHEST FINDINGS  Breathing motion artifact partially limits evaluation. Dependent atelectasis at the lung  bases. Ground-glass opacities in the right and left lower lobes and left upper lobe, suggesting pulmonary edema. The heart is enlarged. There are coronary artery calcifications. The thoracic aorta is normal in caliber with mild atherosclerosis. There is no pleural or pericardial effusion. No pathologically enlarged  mediastinal adenopathy. Limited assessment for hilar adenopathy given lack of contrast and motion artifact. There are no acute or suspicious osseous abnormalities.  CT ABDOMEN AND PELVIS FINDINGS  Lack of contrast limits assessment. Allowing for this, no evidence of focal hepatic lesion. The unenhanced gallbladder, spleen, adrenal glands, and pancreas are normal. No hydronephrosis. Bilateral renal cysts, incompletely characterized without contrast. Largest is in the upper left kidney.  The stomach is decompressed. There are no dilated or thickened bowel loops. Distal colonic diverticulosis without diverticulitis. The appendix is normal. No free air, free fluid, or intra-abdominal fluid collection.  No retroperitoneal adenopathy. Abdominal aorta is normal in caliber. Moderate atherosclerosis of the abdominal aorta and its branches. There is laxity of the anterior abdominal wall with fat containing ventral abdominal wall hernias.  Within the pelvis the urinary bladder is physiologically distended. The uterus is surgically absent. No gross adnexal mass, detailed pelvic evaluation partially obscured by right hip prosthesis.  Postsurgical change in the lumbar spine. Right hip prosthesis in place. There are no acute or suspicious osseous abnormalities.  IMPRESSION: 1. Dependent atelectasis within both lungs. Multifocal ground-glass opacities, suggestive of pulmonary edema. Breathing motion artifact partially limits evaluation. 2. No acute abnormality in the abdomen/pelvis. 3. Atherosclerosis.  Coronary artery calcifications.   Electronically Signed   By: Rubye Oaks M.D.   On: 04/06/2015 05:17   Dg Chest 1 View  03/16/2015   CLINICAL DATA:  Subsequent encounter for worsening shortness of Breath after emergency room visit earlier today.  EXAM: CHEST  1 VIEW  COMPARISON:  Earlier the same day  FINDINGS: 1941 hours. Low lung volumes. Assessment a Lung bases limited by patient body habitus and portable technique. There  is mild vascular congestion without overt airspace pulmonary edema. Probable atelectasis in the lung bases. The cardio pericardial silhouette is enlarged. Bones are diffusely demineralized. Telemetry leads overlie the chest.  IMPRESSION: Limited study secondary to body habitus and portable technique.  Low volume film with enlargement of the cardiopericardial silhouette and vascular congestion.   Electronically Signed   By: Kennith Center M.D.   On: 03/16/2015 19:58   Dg Chest 2 View  04/11/2015   CLINICAL DATA:  81 year old female with history of COPD, hypertension and diabetes.  EXAM: CHEST  2 VIEW  COMPARISON:  Chest x-ray 11/02/2014.  FINDINGS: There is cephalization of the pulmonary vasculature and slight indistinctness of the interstitial markings suggestive of mild pulmonary edema. Small bilateral pleural effusions. Mild cardiomegaly. The patient is rotated to the left on today's exam, resulting in distortion of the mediastinal contours and reduced diagnostic sensitivity and specificity for mediastinal pathology. Atherosclerosis in the thoracic aorta.  IMPRESSION: 1. The appearance of the chest suggests congestive heart failure, as above. 2. Atherosclerosis.   Electronically Signed   By: Trudie Reed M.D.   On: 04/11/2015 13:24   Dg Chest 2 View  03/16/2015   CLINICAL DATA:  Progressive shortness of breath for the past month  EXAM: CHEST  2 VIEW  COMPARISON:  February 10, 2014  FINDINGS: Cardiomegaly is stable. Pulmonary vascularity is normal. There is patchy opacity in the left lower lobe. The lungs elsewhere clear. No adenopathy. There is degenerative change in the thoracic spine.  IMPRESSION:  Patchy opacity in the left lower lobe, likely atelectasis. Lungs elsewhere clear. Heart enlarged with pulmonary vascularity within normal limits.   Electronically Signed   By: Bretta Bang III M.D.   On: 03/16/2015 08:04   Ct Head Wo Contrast  04/06/2015   CLINICAL DATA:  Sepsis and altered mental status.   EXAM: CT HEAD WITHOUT CONTRAST  TECHNIQUE: Contiguous axial images were obtained from the base of the skull through the vertex without intravenous contrast.  COMPARISON:  12/08/2011  FINDINGS: No intracranial hemorrhage, mass effect, or midline shift. CSF density space in the left middle cranial fossa likely an arachnoid cyst, unchanged. No associated mass effect. There is periventricular and deep white matter hypodensity, nonspecific, likely related to chronic small vessel ischemia. No hydrocephalus. The basilar cisterns are patent. No evidence of territorial infarct. No intracranial fluid collection. Calvarium is intact. Included paranasal sinuses are well aerated. The previous mastoid air cell opacification has improved with minimal residual opacification of lower left mastoid air cells.  IMPRESSION: No acute intracranial abnormality.   Electronically Signed   By: Rubye Oaks M.D.   On: 04/06/2015 05:21   Ct Chest Wo Contrast  04/06/2015   CLINICAL DATA:  Sepsis and altered mental status. Fever with hypoxia.  EXAM: CT CHEST, ABDOMEN AND PELVIS WITHOUT CONTRAST  TECHNIQUE: Multidetector CT imaging of the chest, abdomen and pelvis was performed following the standard protocol without IV contrast.  COMPARISON:  Chest radiograph earlier this day. CT abdomen 02/15/2013  FINDINGS: CT CHEST FINDINGS  Breathing motion artifact partially limits evaluation. Dependent atelectasis at the lung bases. Ground-glass opacities in the right and left lower lobes and left upper lobe, suggesting pulmonary edema. The heart is enlarged. There are coronary artery calcifications. The thoracic aorta is normal in caliber with mild atherosclerosis. There is no pleural or pericardial effusion. No pathologically enlarged mediastinal adenopathy. Limited assessment for hilar adenopathy given lack of contrast and motion artifact. There are no acute or suspicious osseous abnormalities.  CT ABDOMEN AND PELVIS FINDINGS  Lack of contrast  limits assessment. Allowing for this, no evidence of focal hepatic lesion. The unenhanced gallbladder, spleen, adrenal glands, and pancreas are normal. No hydronephrosis. Bilateral renal cysts, incompletely characterized without contrast. Largest is in the upper left kidney.  The stomach is decompressed. There are no dilated or thickened bowel loops. Distal colonic diverticulosis without diverticulitis. The appendix is normal. No free air, free fluid, or intra-abdominal fluid collection.  No retroperitoneal adenopathy. Abdominal aorta is normal in caliber. Moderate atherosclerosis of the abdominal aorta and its branches. There is laxity of the anterior abdominal wall with fat containing ventral abdominal wall hernias.  Within the pelvis the urinary bladder is physiologically distended. The uterus is surgically absent. No gross adnexal mass, detailed pelvic evaluation partially obscured by right hip prosthesis.  Postsurgical change in the lumbar spine. Right hip prosthesis in place. There are no acute or suspicious osseous abnormalities.  IMPRESSION: 1. Dependent atelectasis within both lungs. Multifocal ground-glass opacities, suggestive of pulmonary edema. Breathing motion artifact partially limits evaluation. 2. No acute abnormality in the abdomen/pelvis. 3. Atherosclerosis.  Coronary artery calcifications.   Electronically Signed   By: Rubye Oaks M.D.   On: 04/06/2015 05:17   US Venous Img Lower Unilateral Left  04/11/2015   CLINICAL DATA:  Swelling in LEFT lower extremity for 6 days, worsening pain and shortness of breath, history COPD, diabetes mellitus, hypertension, atrial fibrillation, on Xarelto  EXAM: 04/07/2015 LOWER EXTREMITY VENOUS DOPPLER ULTRASOUND  TECHNIQUE: Gray-scale  sonography with graded compression, as well as color Doppler and duplex ultrasound were performed to evaluate the lower extremity deep venous systems from the level of the common femoral vein and including the common femoral,  femoral, profunda femoral, popliteal and calf veins including the posterior tibial, peroneal and gastrocnemius veins when visible. The superficial great saphenous vein was also interrogated. Spectral Doppler was utilized to evaluate flow at rest and with distal augmentation maneuvers in the common femoral, femoral and popliteal veins. Calf imaging limited by body habitus.  COMPARISON:  04/07/2015  FINDINGS: Contralateral Common Femoral Vein: Respiratory phasicity is normal and symmetric with the symptomatic side. No evidence of thrombus. Normal compressibility.  Common Femoral Vein: No evidence of thrombus. Normal compressibility, respiratory phasicity and response to augmentation.  Saphenofemoral Junction: No evidence of thrombus. Normal compressibility and flow on color Doppler imaging.  Profunda Femoral Vein: No evidence of thrombus. Normal compressibility and flow on color Doppler imaging.  Femoral Vein: No evidence of thrombus. Normal compressibility, respiratory phasicity and response to augmentation.  Popliteal Vein: No evidence of thrombus. Normal compressibility, respiratory phasicity and response to augmentation.  Calf Veins: No evidence of thrombus. Normal compressibility and flow on color Doppler imaging.  Superficial Great Saphenous Vein: No evidence of thrombus. Normal compressibility and flow on color Doppler imaging.  Venous Reflux:  None.  Other Findings: Hypoechoic collection at LEFT popliteal fossa 3.2 x 1.4 x 2.4 cm question popliteal cyst.  IMPRESSION: No evidence of deep venous thrombosis in LEFT lower extremity.  Probable Baker cyst LEFT popliteal fossa.   Electronically Signed   By: Ulyses Southward M.D.   On: 04/11/2015 18:32   US Venous Img Lower Unilateral Left  04/07/2015   CLINICAL DATA:  Left leg swelling and pain  EXAM: Left LOWER EXTREMITY VENOUS DOPPLER ULTRASOUND  TECHNIQUE: Gray-scale sonography with graded compression, as well as color Doppler and duplex ultrasound were performed to  evaluate the lower extremity deep venous systems from the level of the common femoral vein and including the common femoral, femoral, profunda femoral, popliteal and calf veins including the posterior tibial, peroneal and gastrocnemius veins when visible. The superficial great saphenous vein was also interrogated. Spectral Doppler was utilized to evaluate flow at rest and with distal augmentation maneuvers in the common femoral, femoral and popliteal veins.  COMPARISON:  None.  FINDINGS: Contralateral Common Femoral Vein: Respiratory phasicity is normal and symmetric with the symptomatic side. No evidence of thrombus. Normal compressibility.  Common Femoral Vein: No evidence of thrombus. Normal compressibility, respiratory phasicity and response to augmentation.  Saphenofemoral Junction: No evidence of thrombus. Normal compressibility and flow on color Doppler imaging.  Profunda Femoral Vein: No evidence of thrombus. Normal compressibility and flow on color Doppler imaging.  Femoral Vein: No evidence of thrombus. Normal compressibility, respiratory phasicity and response to augmentation.  Popliteal Vein: No evidence of thrombus. Normal compressibility, respiratory phasicity and response to augmentation.  Calf Veins: No evidence of thrombus. Normal compressibility and flow on color Doppler imaging.  Superficial Great Saphenous Vein: No evidence of thrombus. Normal compressibility and flow on color Doppler imaging.  Venous Reflux:  None.  Other Findings: There is a 5.4 cm fluid lesion in the popliteal fossa consistent with a popliteal cyst.  IMPRESSION: No evidence of deep venous thrombosis.  Left popliteal cyst.   Electronically Signed   By: Alcide Clever M.D.   On: 04/07/2015 15:16   Dg Chest Port 1 View  04/07/2015   CLINICAL DATA:  Shortness of breath.  EXAM: PORTABLE CHEST - 1 VIEW  COMPARISON:  04/06/2015.  CT 04/06/2015.  FINDINGS: Mediastinum hilar structures normal. Cardiomegaly with normal pulmonary  vascularity. Interim clearing of pulmonary venous congestion and interstitial prominence suggesting clear clearing congestive heart failure. Mild basilar atelectasis. Small left pleural effusion cannot be excluded.  IMPRESSION: 1. Interim clearing of pulmonary venous congestion interstitial prominence suggesting clearing congestive heart failure. Small left pleural effusion may be present. 2. Low lung volumes with mild bibasilar atelectasis.   Electronically Signed   By: Maisie Fus  Register   On: 04/07/2015 07:57   Dg Chest Port 1 View  04/06/2015   CLINICAL DATA:  Altered mental status, tachycardia.  EXAM: PORTABLE CHEST - 1 VIEW  COMPARISON:  03/27/2015  FINDINGS: Lung volumes remain low. Cardiomediastinal contours are unchanged with cardiomegaly. Vascular congestion is again seen. There is new perivascular haziness suggestive of pulmonary edema. Limited lung base assessment due to soft tissue attenuation from body habitus. No pneumothorax. Degenerative change seen in both shoulders.  IMPRESSION: Development perivascular haziness suggesting pulmonary edema. Cardiomegaly and vascular congestion are again seen.   Electronically Signed   By: Rubye Oaks M.D.   On: 04/06/2015 04:22     Assessment and Recommendation  81 y.o. female 81 year old female with acute atrial fibrillation with rapid ventricular rate mixed hyperlipidemia and valvular heart disease on appropriate medication management for further risk reduction in stroke as well as heart rate control with diabetes with complication having diastolic dysfunction heart failure acute in nature now improved with the diarrhetic and cellulitis of left leg improving 1. Continue current medical regimen from outpatient for heart rate control of atrial fibrillation 2. Continue Lasix at 20 mg to 40 mg orally for continued lower extremity edema and diastolic dysfunction heart failure 3. Continue anticoagulation for further risk reduction in stroke with atrial  fibrillation without change 4. Relation and follow for worsening symptoms and possible discharge to home with follow-up later this week for further adjustments of medication management for heart rate control 5. Patient okay for discharge to home from cardiac standpoint and call if further questions  Signed, Arnoldo Hooker M.D. FACC

## 2015-04-14 LAB — GLUCOSE, CAPILLARY
GLUCOSE-CAPILLARY: 107 mg/dL — AB (ref 65–99)
Glucose-Capillary: 278 mg/dL — ABNORMAL HIGH (ref 65–99)

## 2015-04-14 MED ORDER — DILTIAZEM HCL ER BEADS 180 MG PO CP24: 240.0000 mg | ORAL_CAPSULE | Freq: Every day | ORAL | Status: DC

## 2015-04-14 MED ORDER — DIGOXIN 125 MCG PO TABS
0.1250 mg | ORAL_TABLET | Freq: Every day | ORAL | Status: DC
Start: 2015-04-14 — End: 2015-12-04

## 2015-04-14 MED ORDER — AMOXICILLIN 500 MG PO TABS: 1000.0000 mg | ORAL_TABLET | Freq: Two times a day (BID) | ORAL | Status: AC

## 2015-04-14 NOTE — Clinical Social Work Note (Signed)
CSW notified facility, RN, pt and pt's daughter that pt would DC today via EMS to [facility].  CSW signing off unless further needs arise.

## 2015-04-14 NOTE — Progress Notes (Signed)
Removed telemetry and removed PIVs.  Patient to be escorted to [facility] by EMS  afib on monitor, controlled, VSS.  No complaints of pain.

## 2015-04-14 NOTE — Discharge Summary (Signed)
[facility] - [place] at [facility]   PATIENT NAME: Allison Shepard    MR#:  [id number]  DATE OF BIRTH:  07-Feb-1934  DATE OF ADMISSION:  04/06/2015 ADMITTING PHYSICIAN: Delfino Lovett, MD  DATE OF DISCHARGE: 04/14/2015 PRIMARY CARE PHYSICIAN: Mickey Farber, MD    ADMISSION DIAGNOSIS:  Hypoxia [R09.02] Atrial fibrillation with rapid ventricular response [I48.91] Sepsis, due to unspecified organism [A41.9]  DISCHARGE DIAGNOSIS:  Active Problems:   Acute respiratory distress   Sepsis   Diaphragmatic hernia   Restrictive lung disease secondary to obesity   Pneumonia   COPD exacerbation   Other emphysema   Morbid obesity   Debility   SECONDARY DIAGNOSIS:   Past Medical History  Diagnosis Date  . COPD (chronic obstructive pulmonary disease)   . Diabetes mellitus without complication   . Atrial fibrillation   . Hypertension   . Hypercholesteremia     HOSPITAL COURSE:  LATARRA Shepard is a 81 y.o. female with COPD, DM admitted with A fib with RVR and fever to 104. For further details refer to H and P.  1.Septic Shock: This was present on admission and was due to Strep G bacteremia. It was possible that her recent steroids injection in the spine may have caused anosocomial infection. ID was consulted and consultation was appreciated. She was placed on penicillin. As per ID recommendations we will discharge the patient with amoxicillin and thousand elements by mouth twice a day until September 5.  2. Left lotion he cellulitis: Amoxicillin should cover her cellulitis. Lower extremity Dopplers negative for DVT.  3. Rapid atrial fibrillation: Patient has chronic atrial fibrillation on heart he has an and metoprolol. Cardizem was increased and she was started on digoxin. Persistent atrial fibrillation. She'll follow-up with Dr. Lady Gary as an outpatient.  4. Diabetes type 2 without complication: Patient will continue outpatient medication. Continue sliding  scale insulin and ADA diet.  5. COPD: Patient does wear oxygen when necessary at home. Continue inhalers including Symbicort. She was seen and evaluated by pulmonary.     DISCHARGE CONDITIONS AND DIET:  Patient is being discharged to skilled nursing facility on a diabetic diet in stable condition  CONSULTS OBTAINED:  Treatment Team:  Dalia Heading, MD Clydie Braun, MD Lamar Blinks, MD  DRUG ALLERGIES:   Allergies  Allergen Reactions  . Hydromorphone Hcl Nausea And Vomiting  . Ibuprofen Other (See Comments)    Pt was told by her MD not to take this medication.    . Mucinex [Guaifenesin Er] Other (See Comments)    Red all over. Felt like i was on fire  . Percocet [Oxycodone-Acetaminophen] Hives    DISCHARGE MEDICATIONS:   Current Discharge Medication List    START taking these medications   Details  amoxicillin (AMOXIL) 500 MG tablet Take 2 tablets (1,000 mg total) by mouth 2 (two) times daily. Qty: 14 tablet, Refills: 0    digoxin (LANOXIN) 0.125 MG tablet Take 1 tablet (0.125 mg total) by mouth daily. Qty: 30 tablet, Refills: 0      CONTINUE these medications which have CHANGED   Details  diltiazem (TIAZAC) 180 MG 24 hr capsule Take 1 capsule (180 mg total) by mouth daily. Qty: 30 capsule, Refills: 0      CONTINUE these medications which have NOT CHANGED   Details  albuterol (PROVENTIL HFA;VENTOLIN HFA) 108 (90 BASE) MCG/ACT inhaler Inhale 2 puffs into the lungs every 6 (six) hours as needed for wheezing or shortness of breath.  albuterol (PROVENTIL) (2.5 MG/3ML) 0.083% nebulizer solution Take 2.5 mg by nebulization every 6 (six) hours as needed for wheezing or shortness of breath.    Cholecalciferol (VITAMIN D-3) 1000 UNITS CAPS Take 1,000 Units by mouth daily.    donepezil (ARICEPT) 5 MG tablet Take 5 mg by mouth at bedtime.    furosemide (LASIX) 40 MG tablet Take 40 mg by mouth daily.    insulin aspart (NOVOLOG) 100 UNIT/ML injection Inject  25-30 Units into the skin 3 (three) times daily with meals.     insulin glargine (LANTUS) 100 UNIT/ML injection Inject 25 Units into the skin 2 (two) times daily.     lisinopril (PRINIVIL,ZESTRIL) 5 MG tablet Take 5 mg by mouth daily.    meloxicam (MOBIC) 7.5 MG tablet Take 1 tablet (7.5 mg total) by mouth 2 (two) times daily after a meal. Qty: 28 tablet, Refills: 2    metoprolol succinate (TOPROL-XL) 100 MG 24 hr tablet Take 100 mg by mouth daily. Take with or immediately following a meal.    Multiple Vitamins-Minerals (AIRBORNE PO) Take 1 tablet by mouth daily.     rivaroxaban (XARELTO) 20 MG TABS tablet Take 20 mg by mouth daily.    simvastatin (ZOCOR) 10 MG tablet Take 10 mg by mouth daily.    Umeclidinium-Vilanterol (ANORO ELLIPTA) 62.5-25 MCG/INH AEPB Inhale 1 puff into the lungs daily.     zolpidem (AMBIEN) 5 MG tablet Take 5 mg by mouth at bedtime as needed for sleep.    fluticasone (FLONASE) 50 MCG/ACT nasal spray Place 1 spray into both nostrils daily. Qty: 16 g, Refills: 2    guaiFENesin (MUCINEX) 600 MG 12 hr tablet Take 1 tablet (600 mg total) by mouth 2 (two) times daily. Qty: 20 tablet, Refills: 0      STOP taking these medications     predniSONE (STERAPRED UNI-PAK 21 TAB) 10 MG (21) TBPK tablet               Today   CHIEF COMPLAINT:  Patient is doing well this morning. Patient has no complaints.   VITAL SIGNS:  Blood pressure 124/77, pulse 90, temperature 98.6 F (37 C), temperature source Oral, resp. rate 20, height 5\' 5"  (1.651 m), weight 107.321 kg (236 lb 9.6 oz), SpO2 99 %.   REVIEW OF SYSTEMS:  Review of Systems  Constitutional: Negative for fever, chills and malaise/fatigue.  HENT: Negative for sore throat.   Eyes: Negative for blurred vision.  Respiratory: Negative for cough, hemoptysis, shortness of breath and wheezing.   Cardiovascular: Negative for chest pain, palpitations and leg swelling.  Gastrointestinal: Negative for nausea,  vomiting, abdominal pain, diarrhea and blood in stool.  Genitourinary: Negative for dysuria.  Musculoskeletal: Negative for back pain.  Neurological: Negative for dizziness, tremors and headaches.  Endo/Heme/Allergies: Does not bruise/bleed easily.     PHYSICAL EXAMINATION:  GENERAL:  81 y.o.-year-old patient lying in the bed with no acute distress.  NECK:  Supple, no jugular venous distention. No thyroid enlargement, no tenderness.  LUNGS: Normal breath sounds bilaterally, no wheezing, rales,rhonchi  No use of accessory muscles of respiration.  CARDIOVASCULAR: Irregular irregular heart rate controlled. No murmurs, rubs, or gallops.  ABDOMEN: Soft, non-tender, non-distended. Bowel sounds present. No organomegaly or mass.  EXTREMITIES: No pedal edema, cyanosis, or clubbing.  PSYCHIATRIC: The patient is alert and oriented x 3.  SKIN: Left lower extreme cellulitis mid calf slight erythema no tenderness or warmth.  DATA REVIEW:   CBC  Recent Labs Lab  04/08/15 0551  HGB 10.4*    Chemistries   Recent Labs Lab 04/08/15 0551  CREATININE 1.18*    Cardiac Enzymes No results for input(s): TROPONINI in the last 168 hours.  Microbiology Results  @MICRORSLT48 @  RADIOLOGY:  No results found.    Management plans discussed with the patient and she is in agreement. Stable for discharge SNF  Patient should follow up with PCP and Dr Sampson Goon in 2 weeks  CODE STATUS:     Code Status Orders        Start     Ordered   04/06/15 1137  Full code   Continuous     04/06/15 1136      TOTAL TIME TAKING CARE OF THIS PATIENT: 35 minutes.    Jamyia Fortune M.D on 04/14/2015 at 10:22 AM  Between 7am to 6pm - Pager - [phone] After 6pm go to [email or web address] - password [email or web address] [facility]  [facility]  Office  [phone]  CC: Primary care physician; Mickey Farber, MD

## 2015-06-09 ENCOUNTER — Emergency Department (HOSPITAL_COMMUNITY): Payer: Medicare Other

## 2015-06-09 ENCOUNTER — Encounter (HOSPITAL_COMMUNITY): Payer: Self-pay

## 2015-06-09 ENCOUNTER — Emergency Department (HOSPITAL_COMMUNITY)
Admission: EM | Admit: 2015-06-09 | Discharge: 2015-06-10 | Disposition: A | Discharge status: Home / Self Care | Payer: Medicare Other | Attending: Emergency Medicine | Admitting: Emergency Medicine

## 2015-06-09 DIAGNOSIS — Z79899 Other long term (current) drug therapy: Secondary | ICD-10-CM | POA: Insufficient documentation

## 2015-06-09 DIAGNOSIS — W101XXA Fall (on)(from) sidewalk curb, initial encounter: Secondary | ICD-10-CM | POA: Insufficient documentation

## 2015-06-09 DIAGNOSIS — Z7901 Long term (current) use of anticoagulants: Secondary | ICD-10-CM | POA: Insufficient documentation

## 2015-06-09 DIAGNOSIS — I10 Essential (primary) hypertension: Secondary | ICD-10-CM | POA: Diagnosis not present

## 2015-06-09 DIAGNOSIS — E7800 Pure hypercholesterolemia, unspecified: Secondary | ICD-10-CM | POA: Insufficient documentation

## 2015-06-09 DIAGNOSIS — S62102A Fracture of unspecified carpal bone, left wrist, initial encounter for closed fracture: Secondary | ICD-10-CM | POA: Diagnosis not present

## 2015-06-09 DIAGNOSIS — Z7951 Long term (current) use of inhaled steroids: Secondary | ICD-10-CM | POA: Diagnosis not present

## 2015-06-09 DIAGNOSIS — Y9389 Activity, other specified: Secondary | ICD-10-CM | POA: Diagnosis not present

## 2015-06-09 DIAGNOSIS — J449 Chronic obstructive pulmonary disease, unspecified: Secondary | ICD-10-CM | POA: Diagnosis not present

## 2015-06-09 DIAGNOSIS — Z794 Long term (current) use of insulin: Secondary | ICD-10-CM | POA: Insufficient documentation

## 2015-06-09 DIAGNOSIS — E119 Type 2 diabetes mellitus without complications: Secondary | ICD-10-CM | POA: Insufficient documentation

## 2015-06-09 DIAGNOSIS — I4891 Unspecified atrial fibrillation: Secondary | ICD-10-CM | POA: Insufficient documentation

## 2015-06-09 DIAGNOSIS — Z791 Long term (current) use of non-steroidal anti-inflammatories (NSAID): Secondary | ICD-10-CM | POA: Diagnosis not present

## 2015-06-09 DIAGNOSIS — Y998 Other external cause status: Secondary | ICD-10-CM | POA: Insufficient documentation

## 2015-06-09 DIAGNOSIS — Y92480 Sidewalk as the place of occurrence of the external cause: Secondary | ICD-10-CM | POA: Insufficient documentation

## 2015-06-09 DIAGNOSIS — T148XXA Other injury of unspecified body region, initial encounter: Secondary | ICD-10-CM

## 2015-06-09 DIAGNOSIS — S6992XA Unspecified injury of left wrist, hand and finger(s), initial encounter: Secondary | ICD-10-CM | POA: Diagnosis present

## 2015-06-09 MED ORDER — FENTANYL CITRATE (PF) 100 MCG/2ML IJ SOLN
50.0000 ug | Freq: Once | INTRAMUSCULAR | Status: AC
  Administered 2015-06-09: 50 ug via INTRAVENOUS
  Filled 2015-06-09: qty 2

## 2015-06-09 NOTE — ED Notes (Signed)
Bed: WA17 Expected date:  Expected time:  Means of arrival:  Comments: Ems-fall

## 2015-06-09 NOTE — ED Provider Notes (Signed)
CSN: [id number]     Arrival date & time 06/09/15  1906 History   First MD Initiated Contact with Patient 06/09/15 1908     Chief Complaint  Patient presents with  . Fall  . Wrist Injury     (Consider location/radiation/quality/duration/timing/severity/associated sxs/prior Treatment) HPI Allison Shepard is a 81 y.o. female with hx of DM, COPD, Afib, presents to ED with complaint of left wrist injury. Pt states she tripped over a sidewalk after she just got out of her truck. Patient states she fell onto the grass, onto outstretched left hand. She states she also hit her head on the ground but denies loss of consciousness. Patient with deformity and swelling to the left wrist. Splinted by EMS. Patient received 150 g of fentanyl by EMS which she states has not touched the pain yet. She denies any numbness or weakness to the hand. She denies any pain in her head or neck, however she states "I can't feel anything other than= left wrist at this time."  Past Medical History  Diagnosis Date  . COPD (chronic obstructive pulmonary disease) (HCC)   . Diabetes mellitus without complication (HCC)   . Atrial fibrillation (HCC)   . Hypertension   . Hypercholesteremia    Past Surgical History  Procedure Laterality Date  . Abdominal hysterectomy    . Shoulder fusion surgery Right   . Knee surgery Right   . Hip surgery Right   . Back surgery     History reviewed. No pertinent family history. Social History  Substance Use Topics  . Smoking status: Never Smoker   . Smokeless tobacco: None  . Alcohol Use: 0.6 oz/week    0 Standard drinks or equivalent, 1 Glasses of wine per week     Comment: daily   OB History    No data available     Review of Systems  Constitutional: Negative for fever and chills.  Respiratory: Negative for cough, chest tightness and shortness of breath.   Cardiovascular: Negative for chest pain, palpitations and leg swelling.  Gastrointestinal: Negative for nausea,  vomiting, abdominal pain and diarrhea.  Musculoskeletal: Positive for joint swelling and arthralgias. Negative for myalgias, neck pain and neck stiffness.  Skin: Negative for rash.  Neurological: Negative for dizziness, weakness, numbness and headaches.  All other systems reviewed and are negative.     Allergies  Hydromorphone hcl; Ibuprofen; Mucinex; and Percocet  Home Medications   Prior to Admission medications   Medication Sig Start Date End Date Taking? Authorizing Provider  albuterol (PROVENTIL HFA;VENTOLIN HFA) 108 (90 BASE) MCG/ACT inhaler Inhale 2 puffs into the lungs every 6 (six) hours as needed for wheezing or shortness of breath.   Yes Historical Provider, MD  albuterol (PROVENTIL) (2.5 MG/3ML) 0.083% nebulizer solution Take 2.5 mg by nebulization every 6 (six) hours as needed for wheezing or shortness of breath.   Yes Historical Provider, MD  Cholecalciferol (VITAMIN D-3) 1000 UNITS CAPS Take 1,000 Units by mouth daily.   Yes Historical Provider, MD  diltiazem (TIAZAC) 180 MG 24 hr capsule Take 1 capsule (180 mg total) by mouth daily. 04/14/15  Yes Sital Mody, MD  donepezil (ARICEPT) 5 MG tablet Take 5 mg by mouth at bedtime.   Yes Historical Provider, MD  fluticasone (FLONASE) 50 MCG/ACT nasal spray Place 1 spray into both nostrils daily. 03/20/15  Yes Auburn Bilberry, MD  furosemide (LASIX) 40 MG tablet Take 40 mg by mouth daily.   Yes Historical Provider, MD  insulin aspart (NOVOLOG)  100 UNIT/ML injection Inject 25-30 Units into the skin 3 (three) times daily with meals.    Yes Historical Provider, MD  insulin glargine (LANTUS) 100 UNIT/ML injection Inject 25 Units into the skin 2 (two) times daily.    Yes Historical Provider, MD  lisinopril (PRINIVIL,ZESTRIL) 5 MG tablet Take 5 mg by mouth daily.   Yes Historical Provider, MD  metoprolol succinate (TOPROL-XL) 100 MG 24 hr tablet Take 100 mg by mouth daily. Take with or immediately following a meal.   Yes Historical Provider,  MD  nystatin (MYCOSTATIN) powder Apply 1 g topically 2 (two) times daily as needed.   Yes Historical Provider, MD  oxymetazoline (AFRIN) 0.05 % nasal spray Place 1 spray into both nostrils 2 (two) times daily.   Yes Historical Provider, MD  oxymetazoline (VICKS SINEX) 0.05 % nasal spray Place 1 spray into both nostrils 2 (two) times daily.   Yes Historical Provider, MD  rivaroxaban (XARELTO) 20 MG TABS tablet Take 20 mg by mouth daily.   Yes Historical Provider, MD  simvastatin (ZOCOR) 10 MG tablet Take 10 mg by mouth daily.   Yes Historical Provider, MD  zolpidem (AMBIEN) 5 MG tablet Take 5 mg by mouth at bedtime as needed for sleep.   Yes Historical Provider, MD  digoxin (LANOXIN) 0.125 MG tablet Take 1 tablet (0.125 mg total) by mouth daily. 04/14/15   Adrian SaranSital Mody, MD  guaiFENesin (MUCINEX) 600 MG 12 hr tablet Take 1 tablet (600 mg total) by mouth 2 (two) times daily. 03/20/15   Auburn BilberryShreyang Patel, MD  meloxicam (MOBIC) 7.5 MG tablet Take 1 tablet (7.5 mg total) by mouth 2 (two) times daily after a meal. 03/11/15   Tod PersiaWinston Parris, MD   BP 145/94 mmHg  Pulse 85  Temp(Src) 97.7 F (36.5 C) (Oral)  Resp 20  SpO2 96% Physical Exam  Constitutional: She is oriented to person, place, and time. She appears well-developed and well-nourished.  The patient is screaming in pain  HENT:  Head: Normocephalic.  Eyes: Conjunctivae and EOM are normal. Pupils are equal, round, and reactive to light.  Neck: Neck supple.  Cardiovascular: Normal rate, regular rhythm and normal heart sounds.   Pulmonary/Chest: Effort normal and breath sounds normal. No respiratory distress. She has no wheezes. She has no rales.  Musculoskeletal: She exhibits no edema.  Swelling and deformity noted to the left wrist. Small superficial abrasion to the dorsal left wrist. Patient can move all her fingers, Refill less than 2 seconds distally, distal radial pulse intact. Sensation intact and dorsal and palmar surfaces of the hand.   Neurological: She is alert and oriented to person, place, and time.  Skin: Skin is warm and dry.  Psychiatric: She has a normal mood and affect. Her behavior is normal.  Nursing note and vitals reviewed.   ED Course  Procedures (including critical care time) Labs Review Labs Reviewed - No data to display  Imaging Review Dg Wrist 2 Views Left  06/09/2015  CLINICAL DATA:  Post reduction of the left wrist fracture. EXAM: LEFT WRIST - 2 VIEW COMPARISON:  06/09/2015 FINDINGS: Again noted are fractures involving the distal radius and ulna. The wrist and forearm is now within a cast or splint. Radiocarpal joint is poorly characterized on the lateral view. Distal radial fracture is comminuted. Overall alignment of the wrist has slightly improved on the lateral view appear but the bone detail is limited. IMPRESSION: Limited evaluation of the radiocarpal joint and distal radius on the lateral view. Again  noted are fractures involving the distal radius and ulna. Electronically Signed   By: Richarda Overlie M.D.   On: 06/09/2015 22:59   Dg Wrist Complete Left  06/09/2015  CLINICAL DATA:  Fall.  Wrist pain. EXAM: LEFT WRIST - COMPLETE 3+ VIEW COMPARISON:  None. FINDINGS: There are fractures of the distal left radius and ulna. The radial fracture is comminuted, with the primary fracture component across the metaphysis, but with secondary fracture lines extending to the articular surface of the radius. The primary distal fracture component is displaced in a volar direction by a 8 mm, as well as being impacted. The carpus displaces with the distal radial primary fracture component. There is also a comminuted fracture of the distal ulna across the metaphysis without significant displacement. There is no dislocation. Bones are diffusely demineralized. There is diffuse surrounding soft tissue swelling. IMPRESSION: 1. Fractures of the distal left radius and ulna. 2. Distal radial fracture is displaced and comminuted as  well as impacted. Electronically Signed   By: Amie Portland M.D.   On: 06/09/2015 20:41   Dg Wrist Complete Right  06/09/2015  CLINICAL DATA:  Fall today.  Complains of bilateral wrist pain. EXAM: RIGHT WRIST - COMPLETE 3+ VIEW COMPARISON:  None. FINDINGS: Spurring and subchondral sclerosis in the first carpometacarpal joint compatible with degenerative changes. Well corticated bone fragment along the ulnar styloid suggesting an old injury. Mild deformity of the distal radius may be related to an old injury. No evidence for acute fracture. Soft tissues are unremarkable. Wrist is located. Carpal bones are intact. Chondrocalcinosis at the wrist joint. IMPRESSION: No acute bone abnormality involving the right wrist. Electronically Signed   By: Richarda Overlie M.D.   On: 06/09/2015 20:42   Ct Head Wo Contrast  06/09/2015  CLINICAL DATA:  Per EMS, Pt, from home, c/o L wrist injury and R wrist pain after falling this evening. Pain score 10/10. Deformity noted to L wrist. EXAM: CT HEAD WITHOUT CONTRAST CT CERVICAL SPINE WITHOUT CONTRAST TECHNIQUE: Multidetector CT imaging of the head and cervical spine was performed following the standard protocol without intravenous contrast. Multiplanar CT image reconstructions of the cervical spine were also generated. COMPARISON:  04/06/2015.  Cervical spine, 12/08/2011. FINDINGS: CT HEAD FINDINGS The ventricles normal configuration. There is ventricular and sulcal enlargement reflecting mild, stable, age related atrophy. No hydrocephalus. There are no parenchymal masses or mass effect. There is no evidence of cortical infarct. Patchy white matter hypoattenuation is noted consistent with moderate chronic microvascular ischemic change. There is a widened extra-axial space along the anterior inferior left temporal lobe, stable, likely an arachnoid cyst. No other extra-axial abnormalities. There is no intracranial hemorrhage. There is fluid attenuation throughout most of the left  mastoid air cells likely a chronic benign effusion. Right mastoid air cells are clear as are the visualized sinuses. No skull fracture evident. CT CERVICAL SPINE FINDINGS No fracture. No spondylolisthesis. Mild reversal of the normal cervical lordosis with the apex at C5. There is moderate loss disc height at C5-C6 and C6-C7 where there is endplate spurring. There is facet degenerative change bilaterally. Neural foraminal narrowing is noted common most evident on the left at C5-C6, moderate severity. Bones are diffusely demineralized. Soft tissue evaluation is unremarkable.  Lung apices are clear. IMPRESSION: HEAD CT: No acute intracranial abnormalities. Mild atrophy and moderate chronic microvascular ischemic change. CERVICAL CT:  No fracture or acute finding. Electronically Signed   By: Amie Portland M.D.   On: 06/09/2015 20:30  Ct Cervical Spine Wo Contrast  06/09/2015  CLINICAL DATA:  Per EMS, Pt, from home, c/o L wrist injury and R wrist pain after falling this evening. Pain score 10/10. Deformity noted to L wrist. EXAM: CT HEAD WITHOUT CONTRAST CT CERVICAL SPINE WITHOUT CONTRAST TECHNIQUE: Multidetector CT imaging of the head and cervical spine was performed following the standard protocol without intravenous contrast. Multiplanar CT image reconstructions of the cervical spine were also generated. COMPARISON:  04/06/2015.  Cervical spine, 12/08/2011. FINDINGS: CT HEAD FINDINGS The ventricles normal configuration. There is ventricular and sulcal enlargement reflecting mild, stable, age related atrophy. No hydrocephalus. There are no parenchymal masses or mass effect. There is no evidence of cortical infarct. Patchy white matter hypoattenuation is noted consistent with moderate chronic microvascular ischemic change. There is a widened extra-axial space along the anterior inferior left temporal lobe, stable, likely an arachnoid cyst. No other extra-axial abnormalities. There is no intracranial hemorrhage.  There is fluid attenuation throughout most of the left mastoid air cells likely a chronic benign effusion. Right mastoid air cells are clear as are the visualized sinuses. No skull fracture evident. CT CERVICAL SPINE FINDINGS No fracture. No spondylolisthesis. Mild reversal of the normal cervical lordosis with the apex at C5. There is moderate loss disc height at C5-C6 and C6-C7 where there is endplate spurring. There is facet degenerative change bilaterally. Neural foraminal narrowing is noted common most evident on the left at C5-C6, moderate severity. Bones are diffusely demineralized. Soft tissue evaluation is unremarkable.  Lung apices are clear. IMPRESSION: HEAD CT: No acute intracranial abnormalities. Mild atrophy and moderate chronic microvascular ischemic change. CERVICAL CT:  No fracture or acute finding. Electronically Signed   By: Amie Portland M.D.   On: 06/09/2015 20:30   I have personally reviewed and evaluated these images and lab results as part of my medical decision-making.   EKG Interpretation None      MDM   Final diagnoses:  Wrist fracture, left, closed, initial encounter    Patient is screaming in pain, received 150 g of fentanyl by EMS. Patient has bracelets on the wrist and a ring on her left fourth finger. I was able to remove the ring, however we'll have to cut the bracelets. X-rays pending. Will get CT of the head and cervical spine as well. Will order more pain medications.   Comminuted displaced impacted fracture of the left wrist based on x-ray. Spoke with Dr. Roda Shutters, who came in and reduced and splinted the wrist. Patient will be discharged home with Vicodin. She is allergic to Dilaudid and Percocet. Does given prior to discharge. Patient is feeling better. Advised to follow-up with Dr.Xu  In the office as instructed by them  Filed Vitals:   06/09/15 1916 06/09/15 1917 06/09/15 2132 06/09/15 2333  BP: 145/94  157/89 161/92  Pulse: 85  86 87  Temp: 97.7 F (36.5  C)     TempSrc: Oral     Resp: 20  20 18   SpO2: 96% 96% 96% 98%     Jaynie Crumble, PA-C 06/10/15 0030  Gilda Crease, MD 06/10/15 [phone]

## 2015-06-09 NOTE — ED Provider Notes (Signed)
Patient presented to the ER with wrist injury after a fall. Patient reports that she tripped, causing mechanical fall. She tried to brace herself with her left arm, injuring the left wrist. Patient complaining of severe pain despite IV fentanyl by EMS.  Face to face Exam: HEENT - PERRLA Lungs - CTAB Heart - RRR, no M/R/G Abd - S/NT/ND Neuro - alert, oriented x3 Musculoskeletal - tenderness, swelling, deformity of distal forearm/wrist area  Plan: X-ray of wrist to determine level of injury.  Gilda Creasehristopher J Ziggy Chanthavong, MD 06/09/15 2028

## 2015-06-09 NOTE — ED Notes (Signed)
MD - Dr. Roda ShuttersXu at bedside.

## 2015-06-09 NOTE — H&P (Signed)
ORTHOPAEDIC CONSULTATION  REQUESTING PHYSICIAN: Gilda Crease, MD  Chief Complaint: Left wrist injury  HPI: Allison Shepard is a 81 y.o. female who presents with left wrist fracture s/p mechanical fall onto outstretched hand.  Denies LOC.  Pain is severe in the wrist and is grinding, does not radiate, severe when moved, better when immobilized.  Has multiple medical problems including afib, DM, OSA.  Ortho consulted.  Past Medical History  Diagnosis Date  . COPD (chronic obstructive pulmonary disease) (HCC)   . Diabetes mellitus without complication (HCC)   . Atrial fibrillation (HCC)   . Hypertension   . Hypercholesteremia    Past Surgical History  Procedure Laterality Date  . Abdominal hysterectomy    . Shoulder fusion surgery Right   . Knee surgery Right   . Hip surgery Right   . Back surgery     Social History   Social History  . Marital Status: Married    Spouse Name: N/A  . Number of Children: N/A  . Years of Education: N/A   Social History Main Topics  . Smoking status: Never Smoker   . Smokeless tobacco: None  . Alcohol Use: 0.6 oz/week    0 Standard drinks or equivalent, 1 Glasses of wine per week     Comment: daily  . Drug Use: No  . Sexual Activity: Not Asked   Other Topics Concern  . None   Social History Narrative   History reviewed. No pertinent family history. Allergies  Allergen Reactions  . Hydromorphone Hcl Nausea And Vomiting  . Ibuprofen Other (See Comments)    Pt was told by her MD not to take this medication.    . Mucinex [Guaifenesin Er] Other (See Comments)    Red all over. Felt like i was on fire  . Percocet [Oxycodone-Acetaminophen] Hives   Prior to Admission medications   Medication Sig Start Date End Date Taking? Authorizing Provider  albuterol (PROVENTIL HFA;VENTOLIN HFA) 108 (90 BASE) MCG/ACT inhaler Inhale 2 puffs into the lungs every 6 (six) hours as needed for wheezing or shortness of breath.   Yes Historical  Provider, MD  albuterol (PROVENTIL) (2.5 MG/3ML) 0.083% nebulizer solution Take 2.5 mg by nebulization every 6 (six) hours as needed for wheezing or shortness of breath.   Yes Historical Provider, MD  Cholecalciferol (VITAMIN D-3) 1000 UNITS CAPS Take 1,000 Units by mouth daily.   Yes Historical Provider, MD  diltiazem (TIAZAC) 180 MG 24 hr capsule Take 1 capsule (180 mg total) by mouth daily. 04/14/15  Yes Sital Mody, MD  donepezil (ARICEPT) 5 MG tablet Take 5 mg by mouth at bedtime.   Yes Historical Provider, MD  fluticasone (FLONASE) 50 MCG/ACT nasal spray Place 1 spray into both nostrils daily. 03/20/15  Yes Auburn Bilberry, MD  furosemide (LASIX) 40 MG tablet Take 40 mg by mouth daily.   Yes Historical Provider, MD  insulin aspart (NOVOLOG) 100 UNIT/ML injection Inject 8-12 Units into the skin 3 (three) times daily with meals. Below 200=8 units and every 50 units above she gets one extra unit up to 12 units   Yes Historical Provider, MD  insulin glargine (LANTUS) 100 UNIT/ML injection Inject 30 Units into the skin every morning.    Yes Historical Provider, MD  lisinopril (PRINIVIL,ZESTRIL) 5 MG tablet Take 5 mg by mouth daily.   Yes Historical Provider, MD  metoprolol succinate (TOPROL-XL) 100 MG 24 hr tablet Take 100 mg by mouth daily. Take with or immediately following a  meal.   Yes Historical Provider, MD  nystatin (MYCOSTATIN) powder Apply 1 g topically 2 (two) times daily as needed.   Yes Historical Provider, MD  oxymetazoline (AFRIN) 0.05 % nasal spray Place 1 spray into both nostrils 2 (two) times daily.   Yes Historical Provider, MD  oxymetazoline (VICKS SINEX) 0.05 % nasal spray Place 1 spray into both nostrils 2 (two) times daily.   Yes Historical Provider, MD  rivaroxaban (XARELTO) 20 MG TABS tablet Take 20 mg by mouth daily.   Yes Historical Provider, MD  simvastatin (ZOCOR) 10 MG tablet Take 10 mg by mouth daily.   Yes Historical Provider, MD  zolpidem (AMBIEN) 5 MG tablet Take 5 mg by  mouth at bedtime as needed for sleep.   Yes Historical Provider, MD  digoxin (LANOXIN) 0.125 MG tablet Take 1 tablet (0.125 mg total) by mouth daily. Patient not taking: Reported on 06/09/2015 04/14/15   Adrian Saran, MD  meloxicam (MOBIC) 7.5 MG tablet Take 1 tablet (7.5 mg total) by mouth 2 (two) times daily after a meal. Patient not taking: Reported on 06/09/2015 03/11/15   Tod Persia, MD   Dg Wrist Complete Left  06/09/2015  CLINICAL DATA:  Fall.  Wrist pain. EXAM: LEFT WRIST - COMPLETE 3+ VIEW COMPARISON:  None. FINDINGS: There are fractures of the distal left radius and ulna. The radial fracture is comminuted, with the primary fracture component across the metaphysis, but with secondary fracture lines extending to the articular surface of the radius. The primary distal fracture component is displaced in a volar direction by a 8 mm, as well as being impacted. The carpus displaces with the distal radial primary fracture component. There is also a comminuted fracture of the distal ulna across the metaphysis without significant displacement. There is no dislocation. Bones are diffusely demineralized. There is diffuse surrounding soft tissue swelling. IMPRESSION: 1. Fractures of the distal left radius and ulna. 2. Distal radial fracture is displaced and comminuted as well as impacted. Electronically Signed   By: Amie Portland M.D.   On: 06/09/2015 20:41   Dg Wrist Complete Right  06/09/2015  CLINICAL DATA:  Fall today.  Complains of bilateral wrist pain. EXAM: RIGHT WRIST - COMPLETE 3+ VIEW COMPARISON:  None. FINDINGS: Spurring and subchondral sclerosis in the first carpometacarpal joint compatible with degenerative changes. Well corticated bone fragment along the ulnar styloid suggesting an old injury. Mild deformity of the distal radius may be related to an old injury. No evidence for acute fracture. Soft tissues are unremarkable. Wrist is located. Carpal bones are intact. Chondrocalcinosis at the  wrist joint. IMPRESSION: No acute bone abnormality involving the right wrist. Electronically Signed   By: Richarda Overlie M.D.   On: 06/09/2015 20:42   Ct Head Wo Contrast  06/09/2015  CLINICAL DATA:  Per EMS, Pt, from home, c/o L wrist injury and R wrist pain after falling this evening. Pain score 10/10. Deformity noted to L wrist. EXAM: CT HEAD WITHOUT CONTRAST CT CERVICAL SPINE WITHOUT CONTRAST TECHNIQUE: Multidetector CT imaging of the head and cervical spine was performed following the standard protocol without intravenous contrast. Multiplanar CT image reconstructions of the cervical spine were also generated. COMPARISON:  04/06/2015.  Cervical spine, 12/08/2011. FINDINGS: CT HEAD FINDINGS The ventricles normal configuration. There is ventricular and sulcal enlargement reflecting mild, stable, age related atrophy. No hydrocephalus. There are no parenchymal masses or mass effect. There is no evidence of cortical infarct. Patchy white matter hypoattenuation is noted consistent with moderate chronic  microvascular ischemic change. There is a widened extra-axial space along the anterior inferior left temporal lobe, stable, likely an arachnoid cyst. No other extra-axial abnormalities. There is no intracranial hemorrhage. There is fluid attenuation throughout most of the left mastoid air cells likely a chronic benign effusion. Right mastoid air cells are clear as are the visualized sinuses. No skull fracture evident. CT CERVICAL SPINE FINDINGS No fracture. No spondylolisthesis. Mild reversal of the normal cervical lordosis with the apex at C5. There is moderate loss disc height at C5-C6 and C6-C7 where there is endplate spurring. There is facet degenerative change bilaterally. Neural foraminal narrowing is noted common most evident on the left at C5-C6, moderate severity. Bones are diffusely demineralized. Soft tissue evaluation is unremarkable.  Lung apices are clear. IMPRESSION: HEAD CT: No acute intracranial  abnormalities. Mild atrophy and moderate chronic microvascular ischemic change. CERVICAL CT:  No fracture or acute finding. Electronically Signed   By: Amie Portland M.D.   On: 06/09/2015 20:30   Ct Cervical Spine Wo Contrast  06/09/2015  CLINICAL DATA:  Per EMS, Pt, from home, c/o L wrist injury and R wrist pain after falling this evening. Pain score 10/10. Deformity noted to L wrist. EXAM: CT HEAD WITHOUT CONTRAST CT CERVICAL SPINE WITHOUT CONTRAST TECHNIQUE: Multidetector CT imaging of the head and cervical spine was performed following the standard protocol without intravenous contrast. Multiplanar CT image reconstructions of the cervical spine were also generated. COMPARISON:  04/06/2015.  Cervical spine, 12/08/2011. FINDINGS: CT HEAD FINDINGS The ventricles normal configuration. There is ventricular and sulcal enlargement reflecting mild, stable, age related atrophy. No hydrocephalus. There are no parenchymal masses or mass effect. There is no evidence of cortical infarct. Patchy white matter hypoattenuation is noted consistent with moderate chronic microvascular ischemic change. There is a widened extra-axial space along the anterior inferior left temporal lobe, stable, likely an arachnoid cyst. No other extra-axial abnormalities. There is no intracranial hemorrhage. There is fluid attenuation throughout most of the left mastoid air cells likely a chronic benign effusion. Right mastoid air cells are clear as are the visualized sinuses. No skull fracture evident. CT CERVICAL SPINE FINDINGS No fracture. No spondylolisthesis. Mild reversal of the normal cervical lordosis with the apex at C5. There is moderate loss disc height at C5-C6 and C6-C7 where there is endplate spurring. There is facet degenerative change bilaterally. Neural foraminal narrowing is noted common most evident on the left at C5-C6, moderate severity. Bones are diffusely demineralized. Soft tissue evaluation is unremarkable.  Lung apices  are clear. IMPRESSION: HEAD CT: No acute intracranial abnormalities. Mild atrophy and moderate chronic microvascular ischemic change. CERVICAL CT:  No fracture or acute finding. Electronically Signed   By: Amie Portland M.D.   On: 06/09/2015 20:30    Positive ROS: All other systems have been reviewed and were otherwise negative with the exception of those mentioned in the HPI and as above.  Physical Exam: General: Alert, no acute distress Cardiovascular: No pedal edema Respiratory: No cyanosis, no use of accessory musculature GI: No organomegaly, abdomen is soft and non-tender Skin: No lesions in the area of chief complaint Neurologic: Sensation intact distally Psychiatric: Patient is competent for consent with normal mood and affect Lymphatic: No axillary or cervical lymphadenopathy  MUSCULOSKELETAL:  - obvious deformity of the left wrist - superficial abrasion over left dorsal wrist, no evidence of open fracture - NVI, strong radial pulse - hand wwp  Assessment: Left distal radial and ulnar fractures  Plan: - closed reduced  in ER and splinted - patient may be d/c'ed home - will need ORIF possible OREF given poor bone quality - will need preop cardiac clearance prior to surgery - [facility] - patient may go home and we will coordinate surgery as outpatient  Thank you for the consult and the opportunity to see Allison Shepard  N. Glee ArvinMichael Xu, MD [facility] [phone] 10:13 PM

## 2015-06-09 NOTE — Progress Notes (Signed)
[facility]  Patient presents to Ed post fall at home.  EDCM spoke to patient and her husband at bedside.  EDCM asked patient if she would be interested in home health services for RN, PT, OT aide and social worker.  Patient adamantly but politely refused home health services at this time.  Patient stated, "No thank you, we've been through that before, I don't want that again."  [facility] informed [facility] that if she changed her mind that she may obtain home health orders through her pcp.  Patient's husband reports he will be calling patient's pcp tomorrow.  Patient thankful for services.  No further EDCM needs at this time.

## 2015-06-09 NOTE — ED Notes (Signed)
Per EMS, Pt, from home, c/o L wrist injury and R wrist pain after falling this evening.  Pain score 10/10.  Deformity noted to L wrist.  EMS reports that Fire placed L wrist splint.  Fentanyl given en route.

## 2015-06-10 DIAGNOSIS — S62102A Fracture of unspecified carpal bone, left wrist, initial encounter for closed fracture: Secondary | ICD-10-CM | POA: Diagnosis not present

## 2015-06-10 MED ORDER — HYDROCODONE-ACETAMINOPHEN 5-325 MG PO TABS
2.0000 | ORAL_TABLET | Freq: Once | ORAL | Status: AC
  Administered 2015-06-10: 2 via ORAL
  Filled 2015-06-10: qty 2

## 2015-06-10 MED ORDER — HYDROCODONE-ACETAMINOPHEN 5-325 MG PO TABS: 1.0000 | ORAL_TABLET | ORAL | Status: DC | PRN

## 2015-06-10 NOTE — Discharge Instructions (Signed)
Take pain medication as prescribed as needed for severe pain. Ice and elevate your hand. Follow up with Dr. Roda Shutters as instructed.   Wrist Fracture A wrist fracture is a break or crack in one of the bones of your wrist. Your wrist is made up of eight small bones at the palm of your hand (carpal bones) and two long bones that make up your forearm (radius and ulna). CAUSES  A direct blow to the wrist.  Falling on an outstretched hand.  Trauma, such as a car accident or a fall. RISK FACTORS Risk factors for wrist fracture include:  Participating in contact and high-risk sports, such as skiing, biking, and ice skating.  Taking steroid medicines.  Smoking.  Being female.  Being Caucasian.  Drinking more than three alcoholic beverages per day.  Having low or lowered bone density (osteoporosis or osteopenia).  Age. Older adults have decreased bone density.  Women who have had menopause.  History of previous fractures. SIGNS AND SYMPTOMS Symptoms of wrist fractures include tenderness, bruising, and inflammation. Additionally, the wrist may hang in an odd position or appear deformed. DIAGNOSIS Diagnosis may include:  Physical exam.  X-ray. TREATMENT Treatment depends on many factors, including the nature and location of the fracture, your age, and your activity level. Treatment for wrist fracture can be nonsurgical or surgical. Nonsurgical Treatment A plaster cast or splint may be applied to your wrist if the bone is in a good position. If the fracture is not in good position, it may be necessary for your health care provider to realign it before applying a splint or cast. Usually, a cast or splint will be worn for several weeks. Surgical Treatment Sometimes the position of the bone is so far out of place that surgery is required to apply a device to hold it together as it heals. Depending on the fracture, there are a number of options for holding the bone in place while it heals,  such as a cast and metal pins. HOME CARE INSTRUCTIONS  Keep your injured wrist elevated and move your fingers as much as possible.  Do not put pressure on any part of your cast or splint. It may break.  Use a plastic bag to protect your cast or splint from water while bathing or showering. Do not lower your cast or splint into water.  Take medicines only as directed by your health care provider.  Keep your cast or splint clean and dry. If it becomes wet, damaged, or suddenly feels too tight, contact your health care provider right away.  Do not use any tobacco products including cigarettes, chewing tobacco, or electronic cigarettes. Tobacco can delay bone healing. If you need help quitting, ask your health care provider.  Keep all follow-up visits as directed by your health care provider. This is important.  Ask your health care provider if you should take supplements of calcium and vitamins C and D to promote bone healing. SEEK MEDICAL CARE IF:  Your cast or splint is damaged, breaks, or gets wet.  You have a fever.  You have chills.  You have continued severe pain or more swelling than you did before the cast was put on. SEEK IMMEDIATE MEDICAL CARE IF:  Your hand or fingernails on the injured arm turn blue or gray, or feel cold or numb.  You have decreased feeling in the fingers of your injured arm. MAKE SURE YOU:  Understand these instructions.  Will watch your condition.  Will get help right away  if you are not doing well or get worse.   This information is not intended to replace advice given to you by your health care provider. Make sure you discuss any questions you have with your health care provider.   Document Released: 05/11/2005 Document Revised: 04/22/2015 Document Reviewed: 08/19/2011 Elsevier Interactive Patient Education [employer]

## 2015-06-12 ENCOUNTER — Other Ambulatory Visit (HOSPITAL_COMMUNITY): Payer: Self-pay | Admitting: Orthopaedic Surgery

## 2015-06-12 ENCOUNTER — Other Ambulatory Visit (HOSPITAL_BASED_OUTPATIENT_CLINIC_OR_DEPARTMENT_OTHER): Payer: Self-pay | Admitting: Orthopaedic Surgery

## 2015-06-12 NOTE — Progress Notes (Signed)
Chart reviewed by Dr Michelle Piperssey, patient needs to be evaluated and cleared by her cardiologist Dr Lady GaryFath before surgery. I called pts daughter Orvilla FusLeslie Peregoy to let her know this and I called Sherrie at Dr Warren DanesXu's office to let her know also.

## 2015-06-16 ENCOUNTER — Encounter (HOSPITAL_BASED_OUTPATIENT_CLINIC_OR_DEPARTMENT_OTHER): Payer: Self-pay | Admitting: *Deleted

## 2015-06-16 MED ORDER — CEFAZOLIN SODIUM-DEXTROSE 2-3 GM-% IV SOLR
2.0000 g | INTRAVENOUS | Status: AC
  Administered 2015-06-17: 2 g via INTRAVENOUS
  Filled 2015-06-16: qty 50

## 2015-06-16 NOTE — Progress Notes (Signed)
Pt has hx of Atrial Fibrillation, cardiac clearance note in chart from Dr. Lady GaryFath. Pt was instructed to stop Xarelto 2 days prior to surgery, last dose 12 06/14/15. Pt denies any chest pain or sob.  Last A1C was 5.5 on 12/22/14 (in Care Everywhere). Pt's husband states pt's fasting blood sugar usually runs between 160-170. Instructed pt and husband to call Short Stay in AM if blood sugar is less than 70. They both state that her sugar has never been that low.   Echo - 03/11/15 in Care Everywhere  EKG - 04/16/15 in [facility]

## 2015-06-17 ENCOUNTER — Ambulatory Visit (HOSPITAL_COMMUNITY): Payer: Medicare Other | Admitting: Anesthesiology

## 2015-06-17 ENCOUNTER — Observation Stay (HOSPITAL_BASED_OUTPATIENT_CLINIC_OR_DEPARTMENT_OTHER)
Admission: RE | Admit: 2015-06-17 | Discharge: 2015-06-19 | Disposition: A | Discharge status: Home / Self Care | Payer: Medicare Other | Source: Ambulatory Visit | Attending: Orthopaedic Surgery | Admitting: Orthopaedic Surgery

## 2015-06-17 ENCOUNTER — Encounter (HOSPITAL_COMMUNITY): Payer: Self-pay | Admitting: Certified Registered Nurse Anesthetist

## 2015-06-17 ENCOUNTER — Ambulatory Visit: Payer: Medicare Other | Admitting: Anesthesiology

## 2015-06-17 ENCOUNTER — Encounter (HOSPITAL_COMMUNITY)
Admission: RE | Disposition: A | Discharge status: Home / Self Care | Payer: Self-pay | Source: Ambulatory Visit | Attending: Orthopaedic Surgery

## 2015-06-17 DIAGNOSIS — Y9389 Activity, other specified: Secondary | ICD-10-CM | POA: Insufficient documentation

## 2015-06-17 DIAGNOSIS — Z7901 Long term (current) use of anticoagulants: Secondary | ICD-10-CM | POA: Diagnosis not present

## 2015-06-17 DIAGNOSIS — Z794 Long term (current) use of insulin: Secondary | ICD-10-CM | POA: Diagnosis not present

## 2015-06-17 DIAGNOSIS — S52592A Other fractures of lower end of left radius, initial encounter for closed fracture: Principal | ICD-10-CM | POA: Insufficient documentation

## 2015-06-17 DIAGNOSIS — S52602A Unspecified fracture of lower end of left ulna, initial encounter for closed fracture: Secondary | ICD-10-CM

## 2015-06-17 DIAGNOSIS — W010XXA Fall on same level from slipping, tripping and stumbling without subsequent striking against object, initial encounter: Secondary | ICD-10-CM | POA: Diagnosis not present

## 2015-06-17 DIAGNOSIS — I4891 Unspecified atrial fibrillation: Secondary | ICD-10-CM | POA: Diagnosis not present

## 2015-06-17 DIAGNOSIS — S52692A Other fracture of lower end of left ulna, initial encounter for closed fracture: Secondary | ICD-10-CM | POA: Diagnosis not present

## 2015-06-17 DIAGNOSIS — I10 Essential (primary) hypertension: Secondary | ICD-10-CM | POA: Diagnosis not present

## 2015-06-17 DIAGNOSIS — Z96641 Presence of right artificial hip joint: Secondary | ICD-10-CM | POA: Insufficient documentation

## 2015-06-17 DIAGNOSIS — Z9889 Other specified postprocedural states: Secondary | ICD-10-CM

## 2015-06-17 DIAGNOSIS — J449 Chronic obstructive pulmonary disease, unspecified: Secondary | ICD-10-CM | POA: Diagnosis not present

## 2015-06-17 DIAGNOSIS — Z96651 Presence of right artificial knee joint: Secondary | ICD-10-CM | POA: Insufficient documentation

## 2015-06-17 DIAGNOSIS — Z87891 Personal history of nicotine dependence: Secondary | ICD-10-CM | POA: Diagnosis not present

## 2015-06-17 DIAGNOSIS — Z8781 Personal history of (healed) traumatic fracture: Secondary | ICD-10-CM

## 2015-06-17 DIAGNOSIS — E119 Type 2 diabetes mellitus without complications: Secondary | ICD-10-CM | POA: Insufficient documentation

## 2015-06-17 DIAGNOSIS — E7800 Pure hypercholesterolemia, unspecified: Secondary | ICD-10-CM | POA: Insufficient documentation

## 2015-06-17 DIAGNOSIS — Y998 Other external cause status: Secondary | ICD-10-CM | POA: Diagnosis not present

## 2015-06-17 DIAGNOSIS — Y9289 Other specified places as the place of occurrence of the external cause: Secondary | ICD-10-CM | POA: Insufficient documentation

## 2015-06-17 DIAGNOSIS — S52502A Unspecified fracture of the lower end of left radius, initial encounter for closed fracture: Secondary | ICD-10-CM | POA: Diagnosis present

## 2015-06-17 HISTORY — DX: Headache, unspecified: R51.9

## 2015-06-17 HISTORY — DX: Other complications of anesthesia, initial encounter: T88.59XA

## 2015-06-17 HISTORY — DX: Adverse effect of unspecified anesthetic, initial encounter: T41.45XA

## 2015-06-17 HISTORY — PX: OPEN REDUCTION INTERNAL FIXATION (ORIF) DISTAL RADIAL FRACTURE: SHX5989

## 2015-06-17 HISTORY — DX: Pneumonia, unspecified organism: J18.9

## 2015-06-17 HISTORY — PX: ORIF DISTAL RADIUS FRACTURE: SUR927

## 2015-06-17 HISTORY — DX: Headache: R51

## 2015-06-17 LAB — BASIC METABOLIC PANEL
Anion gap: 10 (ref 5–15)
BUN: 18 mg/dL (ref 6–20)
CALCIUM: 8.6 mg/dL — AB (ref 8.9–10.3)
CHLORIDE: 109 mmol/L (ref 101–111)
CO2: 22 mmol/L (ref 22–32)
CREATININE: 1.19 mg/dL — AB (ref 0.44–1.00)
GFR calc non Af Amer: 43 mL/min — ABNORMAL LOW (ref >60)
GFR, EST AFRICAN AMERICAN: 49 mL/min — AB (ref >60)
GLUCOSE: 150 mg/dL — AB (ref 65–99)
Potassium: 4 mmol/L (ref 3.5–5.1)
Sodium: 141 mmol/L (ref 135–145)

## 2015-06-17 LAB — CBC
HCT: 39.2 % (ref 36.0–46.0)
HEMOGLOBIN: 12.8 g/dL (ref 12.0–15.0)
MCH: 33.3 pg (ref 26.0–34.0)
MCHC: 32.7 g/dL (ref 30.0–36.0)
MCV: 102.1 fL — AB (ref 78.0–100.0)
PLATELETS: 206 10*3/uL (ref 150–400)
RBC: 3.84 MIL/uL — AB (ref 3.87–5.11)
RDW: 16.4 % — ABNORMAL HIGH (ref 11.5–15.5)
WBC: 6.9 10*3/uL (ref 4.0–10.5)

## 2015-06-17 LAB — GLUCOSE, CAPILLARY
GLUCOSE-CAPILLARY: 138 mg/dL — AB (ref 65–99)
GLUCOSE-CAPILLARY: 172 mg/dL — AB (ref 65–99)
Glucose-Capillary: 111 mg/dL — ABNORMAL HIGH (ref 65–99)
Glucose-Capillary: 127 mg/dL — ABNORMAL HIGH (ref 65–99)

## 2015-06-17 LAB — PROTIME-INR
INR: 1.18 (ref 0.00–1.49)
Prothrombin Time: 15.1 seconds (ref 11.6–15.2)

## 2015-06-17 SURGERY — OPEN REDUCTION INTERNAL FIXATION (ORIF) DISTAL RADIUS FRACTURE
Anesthesia: Regional | Site: Arm Lower | Laterality: Left

## 2015-06-17 MED ORDER — NYSTATIN 100000 UNIT/GM EX POWD
1.0000 g | Freq: Two times a day (BID) | CUTANEOUS | Status: DC | PRN
  Filled 2015-06-17: qty 30

## 2015-06-17 MED ORDER — 0.9 % SODIUM CHLORIDE (POUR BTL) OPTIME
TOPICAL | Status: DC | PRN
  Administered 2015-06-17: 1000 mL

## 2015-06-17 MED ORDER — MIDAZOLAM HCL 2 MG/2ML IJ SOLN
INTRAMUSCULAR | Status: AC
  Filled 2015-06-17: qty 4

## 2015-06-17 MED ORDER — ACETAMINOPHEN 650 MG RE SUPP: 650.0000 mg | Freq: Four times a day (QID) | RECTAL | Status: DC | PRN

## 2015-06-17 MED ORDER — MORPHINE SULFATE (PF) 2 MG/ML IV SOLN
2.0000 mg | INTRAVENOUS | Status: DC | PRN
  Administered 2015-06-19: 2 mg via INTRAVENOUS
  Filled 2015-06-17: qty 1

## 2015-06-17 MED ORDER — ZOLPIDEM TARTRATE 5 MG PO TABS: 5.0000 mg | ORAL_TABLET | Freq: Every evening | ORAL | Status: DC | PRN

## 2015-06-17 MED ORDER — LIDOCAINE HCL (CARDIAC) 20 MG/ML IV SOLN
INTRAVENOUS | Status: AC
  Filled 2015-06-17: qty 5

## 2015-06-17 MED ORDER — DIPHENHYDRAMINE HCL 12.5 MG/5ML PO ELIX: 25.0000 mg | ORAL_SOLUTION | ORAL | Status: DC | PRN

## 2015-06-17 MED ORDER — PHENYLEPHRINE 40 MCG/ML (10ML) SYRINGE FOR IV PUSH (FOR BLOOD PRESSURE SUPPORT)
PREFILLED_SYRINGE | INTRAVENOUS | Status: AC
  Filled 2015-06-17: qty 10

## 2015-06-17 MED ORDER — METHOCARBAMOL 500 MG PO TABS
500.0000 mg | ORAL_TABLET | Freq: Four times a day (QID) | ORAL | Status: DC | PRN
  Administered 2015-06-19: 500 mg via ORAL
  Filled 2015-06-17: qty 1

## 2015-06-17 MED ORDER — MIDAZOLAM HCL 2 MG/2ML IJ SOLN
INTRAMUSCULAR | Status: AC
  Administered 2015-06-17: 2 mg
  Filled 2015-06-17: qty 2

## 2015-06-17 MED ORDER — DILTIAZEM HCL ER COATED BEADS 240 MG PO CP24
240.0000 mg | ORAL_CAPSULE | Freq: Every day | ORAL | Status: DC
  Administered 2015-06-18 – 2015-06-19 (×2): 240 mg via ORAL
  Filled 2015-06-17 (×4): qty 1

## 2015-06-17 MED ORDER — METHOCARBAMOL 1000 MG/10ML IJ SOLN
500.0000 mg | Freq: Four times a day (QID) | INTRAMUSCULAR | Status: DC | PRN
  Filled 2015-06-17: qty 5

## 2015-06-17 MED ORDER — INSULIN ASPART 100 UNIT/ML ~~LOC~~ SOLN
0.0000 [IU] | Freq: Three times a day (TID) | SUBCUTANEOUS | Status: DC
  Administered 2015-06-18: 3 [IU] via SUBCUTANEOUS
  Administered 2015-06-18 – 2015-06-19 (×2): 4 [IU] via SUBCUTANEOUS
  Administered 2015-06-19: 3 [IU] via SUBCUTANEOUS

## 2015-06-17 MED ORDER — VITAMIN D 1000 UNITS PO TABS
1000.0000 [IU] | ORAL_TABLET | Freq: Every day | ORAL | Status: DC
Start: 2015-06-17 — End: 2015-06-19
  Administered 2015-06-17 – 2015-06-19 (×3): 1000 [IU] via ORAL
  Filled 2015-06-17 (×5): qty 1

## 2015-06-17 MED ORDER — FENTANYL CITRATE (PF) 250 MCG/5ML IJ SOLN
INTRAMUSCULAR | Status: AC
  Filled 2015-06-17: qty 5

## 2015-06-17 MED ORDER — METOPROLOL SUCCINATE ER 100 MG PO TB24
100.0000 mg | ORAL_TABLET | Freq: Every day | ORAL | Status: DC
  Administered 2015-06-18 – 2015-06-19 (×2): 100 mg via ORAL
  Filled 2015-06-17 (×2): qty 1

## 2015-06-17 MED ORDER — INSULIN ASPART 100 UNIT/ML ~~LOC~~ SOLN: 0.0000 [IU] | Freq: Every day | SUBCUTANEOUS | Status: DC

## 2015-06-17 MED ORDER — ONDANSETRON HCL 4 MG/2ML IJ SOLN: 4.0000 mg | Freq: Four times a day (QID) | INTRAMUSCULAR | Status: DC | PRN

## 2015-06-17 MED ORDER — SODIUM CHLORIDE 0.9 % IV SOLN
INTRAVENOUS | Status: DC
  Administered 2015-06-17: 18:00:00 via INTRAVENOUS

## 2015-06-17 MED ORDER — LIDOCAINE HCL (CARDIAC) 20 MG/ML IV SOLN
INTRAVENOUS | Status: DC | PRN
  Administered 2015-06-17: 40 mg via INTRAVENOUS

## 2015-06-17 MED ORDER — HYDROCODONE-ACETAMINOPHEN 10-325 MG PO TABS
1.0000 | ORAL_TABLET | ORAL | Status: DC | PRN
  Administered 2015-06-18 – 2015-06-19 (×3): 1 via ORAL
  Filled 2015-06-17 (×2): qty 1
  Filled 2015-06-17: qty 2
  Filled 2015-06-17: qty 1

## 2015-06-17 MED ORDER — FLUTICASONE PROPIONATE 50 MCG/ACT NA SUSP
1.0000 | Freq: Every day | NASAL | Status: DC
  Administered 2015-06-19: 1 via NASAL
  Filled 2015-06-17 (×2): qty 16

## 2015-06-17 MED ORDER — LACTATED RINGERS IV SOLN
INTRAVENOUS | Status: DC
  Administered 2015-06-17: 10:00:00 via INTRAVENOUS

## 2015-06-17 MED ORDER — ONDANSETRON HCL 4 MG PO TABS: 4.0000 mg | ORAL_TABLET | Freq: Four times a day (QID) | ORAL | Status: DC | PRN

## 2015-06-17 MED ORDER — INSULIN GLARGINE 100 UNIT/ML ~~LOC~~ SOLN
30.0000 [IU] | Freq: Every morning | SUBCUTANEOUS | Status: DC
  Administered 2015-06-18 – 2015-06-19 (×2): 30 [IU] via SUBCUTANEOUS
  Filled 2015-06-17 (×3): qty 0.3

## 2015-06-17 MED ORDER — LISINOPRIL 5 MG PO TABS
5.0000 mg | ORAL_TABLET | Freq: Every day | ORAL | Status: DC
  Administered 2015-06-17 – 2015-06-19 (×3): 5 mg via ORAL
  Filled 2015-06-17 (×4): qty 1

## 2015-06-17 MED ORDER — ONDANSETRON HCL 4 MG/2ML IJ SOLN
INTRAMUSCULAR | Status: DC | PRN
  Administered 2015-06-17: 4 mg via INTRAVENOUS

## 2015-06-17 MED ORDER — BUPIVACAINE HCL (PF) 0.25 % IJ SOLN
INTRAMUSCULAR | Status: AC
  Filled 2015-06-17: qty 30

## 2015-06-17 MED ORDER — ONDANSETRON HCL 4 MG/2ML IJ SOLN: 4.0000 mg | Freq: Once | INTRAMUSCULAR | Status: DC | PRN

## 2015-06-17 MED ORDER — SIMVASTATIN 10 MG PO TABS
10.0000 mg | ORAL_TABLET | Freq: Every day | ORAL | Status: DC
  Administered 2015-06-17 – 2015-06-19 (×3): 10 mg via ORAL
  Filled 2015-06-17 (×3): qty 1

## 2015-06-17 MED ORDER — OXYMETAZOLINE HCL 0.05 % NA SOLN
NASAL | Status: AC
  Filled 2015-06-17: qty 15

## 2015-06-17 MED ORDER — DEXMEDETOMIDINE HCL IN NACL 200 MCG/50ML IV SOLN
INTRAVENOUS | Status: AC
  Filled 2015-06-17: qty 50

## 2015-06-17 MED ORDER — FENTANYL CITRATE (PF) 100 MCG/2ML IJ SOLN
INTRAMUSCULAR | Status: AC
  Administered 2015-06-17: 100 ug
  Filled 2015-06-17: qty 2

## 2015-06-17 MED ORDER — LACTATED RINGERS IV SOLN
INTRAVENOUS | Status: DC | PRN
  Administered 2015-06-17 (×2): via INTRAVENOUS

## 2015-06-17 MED ORDER — EPHEDRINE SULFATE 50 MG/ML IJ SOLN
INTRAMUSCULAR | Status: AC
  Filled 2015-06-17: qty 1

## 2015-06-17 MED ORDER — INSULIN ASPART 100 UNIT/ML ~~LOC~~ SOLN: 8.0000 [IU] | Freq: Three times a day (TID) | SUBCUTANEOUS | Status: DC

## 2015-06-17 MED ORDER — ONDANSETRON HCL 4 MG/2ML IJ SOLN
INTRAMUSCULAR | Status: AC
  Filled 2015-06-17: qty 2

## 2015-06-17 MED ORDER — ROCURONIUM BROMIDE 50 MG/5ML IV SOLN
INTRAVENOUS | Status: AC
  Filled 2015-06-17: qty 1

## 2015-06-17 MED ORDER — PROPOFOL 10 MG/ML IV BOLUS
INTRAVENOUS | Status: DC | PRN
  Administered 2015-06-17: 130 mg via INTRAVENOUS

## 2015-06-17 MED ORDER — ACETAMINOPHEN 325 MG PO TABS
650.0000 mg | ORAL_TABLET | Freq: Four times a day (QID) | ORAL | Status: DC | PRN
  Administered 2015-06-18 – 2015-06-19 (×3): 650 mg via ORAL
  Filled 2015-06-17 (×3): qty 2

## 2015-06-17 MED ORDER — CEFAZOLIN SODIUM-DEXTROSE 2-3 GM-% IV SOLR
2.0000 g | Freq: Four times a day (QID) | INTRAVENOUS | Status: AC
  Administered 2015-06-17 – 2015-06-18 (×3): 2 g via INTRAVENOUS
  Filled 2015-06-17 (×3): qty 50

## 2015-06-17 MED ORDER — SODIUM CHLORIDE 0.9 % IJ SOLN
INTRAMUSCULAR | Status: AC
  Filled 2015-06-17: qty 10

## 2015-06-17 MED ORDER — ALBUTEROL SULFATE (2.5 MG/3ML) 0.083% IN NEBU: 2.5000 mg | INHALATION_SOLUTION | Freq: Four times a day (QID) | RESPIRATORY_TRACT | Status: DC | PRN

## 2015-06-17 MED ORDER — ALBUTEROL SULFATE (2.5 MG/3ML) 0.083% IN NEBU
3.0000 mL | INHALATION_SOLUTION | Freq: Four times a day (QID) | RESPIRATORY_TRACT | Status: DC | PRN
  Administered 2015-06-19: 3 mL via RESPIRATORY_TRACT
  Filled 2015-06-17 (×2): qty 3

## 2015-06-17 MED ORDER — DONEPEZIL HCL 5 MG PO TABS
5.0000 mg | ORAL_TABLET | Freq: Every day | ORAL | Status: DC
  Administered 2015-06-17 – 2015-06-18 (×2): 5 mg via ORAL
  Filled 2015-06-17 (×2): qty 1

## 2015-06-17 MED ORDER — PHENYLEPHRINE HCL 10 MG/ML IJ SOLN
INTRAMUSCULAR | Status: DC | PRN
  Administered 2015-06-17 (×5): 80 ug via INTRAVENOUS
  Administered 2015-06-17 (×2): 40 ug via INTRAVENOUS
  Administered 2015-06-17: 120 ug via INTRAVENOUS
  Administered 2015-06-17 (×3): 80 ug via INTRAVENOUS

## 2015-06-17 MED ORDER — OXYMETAZOLINE HCL 0.05 % NA SOLN
NASAL | Status: DC | PRN
  Administered 2015-06-17 (×3): 2 via NASAL

## 2015-06-17 MED ORDER — METOCLOPRAMIDE HCL 5 MG PO TABS
5.0000 mg | ORAL_TABLET | Freq: Three times a day (TID) | ORAL | Status: DC | PRN
Start: 2015-06-17 — End: 2015-06-19

## 2015-06-17 MED ORDER — FENTANYL CITRATE (PF) 100 MCG/2ML IJ SOLN: 25.0000 ug | INTRAMUSCULAR | Status: DC | PRN

## 2015-06-17 MED ORDER — DIGOXIN 125 MCG PO TABS
0.1250 mg | ORAL_TABLET | Freq: Every day | ORAL | Status: DC
  Administered 2015-06-17 – 2015-06-19 (×3): 0.125 mg via ORAL
  Filled 2015-06-17 (×4): qty 1

## 2015-06-17 MED ORDER — OXYMETAZOLINE HCL 0.05 % NA SOLN
1.0000 | Freq: Two times a day (BID) | NASAL | Status: DC
  Filled 2015-06-17: qty 15

## 2015-06-17 MED ORDER — BUPIVACAINE HCL (PF) 0.25 % IJ SOLN
INTRAMUSCULAR | Status: DC | PRN
  Administered 2015-06-17: 30 mL

## 2015-06-17 MED ORDER — FUROSEMIDE 40 MG PO TABS
40.0000 mg | ORAL_TABLET | Freq: Every day | ORAL | Status: DC
  Administered 2015-06-18 – 2015-06-19 (×2): 40 mg via ORAL
  Filled 2015-06-17 (×3): qty 1

## 2015-06-17 MED ORDER — MIDAZOLAM HCL 5 MG/5ML IJ SOLN
INTRAMUSCULAR | Status: DC | PRN
  Administered 2015-06-17: 1 mg via INTRAVENOUS

## 2015-06-17 MED ORDER — OXYMETAZOLINE HCL 0.05 % NA SOLN: 1.0000 | Freq: Two times a day (BID) | NASAL | Status: DC

## 2015-06-17 MED ORDER — METOCLOPRAMIDE HCL 5 MG/ML IJ SOLN
5.0000 mg | Freq: Three times a day (TID) | INTRAMUSCULAR | Status: DC | PRN
Start: 2015-06-17 — End: 2015-06-19

## 2015-06-17 MED ORDER — PROPOFOL 10 MG/ML IV BOLUS
INTRAVENOUS | Status: AC
  Filled 2015-06-17: qty 20

## 2015-06-17 MED ORDER — DEXMEDETOMIDINE HCL IN NACL 200 MCG/50ML IV SOLN
INTRAVENOUS | Status: DC | PRN
  Administered 2015-06-17 (×3): 8 ug via INTRAVENOUS

## 2015-06-17 MED ORDER — HYDROCODONE-ACETAMINOPHEN 10-325 MG PO TABS: 1.0000 | ORAL_TABLET | Freq: Four times a day (QID) | ORAL | Status: DC | PRN

## 2015-06-17 SURGICAL SUPPLY — 74 items
1.6MM KWIRE ×4 IMPLANT
BANDAGE ELASTIC 3 VELCRO ST LF (GAUZE/BANDAGES/DRESSINGS) ×3 IMPLANT
BANDAGE ELASTIC 4 VELCRO ST LF (GAUZE/BANDAGES/DRESSINGS) ×3 IMPLANT
BIT DRILL 2.2 SS TIBIAL (BIT) ×3 IMPLANT
BLADE SURG 10 STRL SS (BLADE) ×3 IMPLANT
BLADE SURG 15 STRL LF DISP TIS (BLADE) ×1 IMPLANT
BLADE SURG 15 STRL SS (BLADE) ×2
BLADE SURG ROTATE 9660 (MISCELLANEOUS) IMPLANT
BNDG ESMARK 4X9 LF (GAUZE/BANDAGES/DRESSINGS) ×3 IMPLANT
BNDG GAUZE ELAST 4 BULKY (GAUZE/BANDAGES/DRESSINGS) ×3 IMPLANT
CORDS BIPOLAR (ELECTRODE) ×3 IMPLANT
COVER SURGICAL LIGHT HANDLE (MISCELLANEOUS) ×3 IMPLANT
CUFF TOURNIQUET SINGLE 18IN (TOURNIQUET CUFF) ×3 IMPLANT
CUFF TOURNIQUET SINGLE 24IN (TOURNIQUET CUFF) IMPLANT
DRAPE C-ARM 42X72 X-RAY (DRAPES) IMPLANT
DRAPE OEC MINIVIEW 54X84 (DRAPES) ×6 IMPLANT
DRAPE SURG 17X23 STRL (DRAPES) ×3 IMPLANT
DRAPE U-SHAPE 47X51 STRL (DRAPES) ×3 IMPLANT
DURAPREP 26ML APPLICATOR (WOUND CARE) ×3 IMPLANT
ELECT CAUTERY BLADE 6.4 (BLADE) ×3 IMPLANT
GAUZE SPONGE 4X4 12PLY STRL (GAUZE/BANDAGES/DRESSINGS) ×3 IMPLANT
GAUZE XEROFORM 1X8 LF (GAUZE/BANDAGES/DRESSINGS) ×3 IMPLANT
GAUZE XEROFORM 5X9 LF (GAUZE/BANDAGES/DRESSINGS) ×3 IMPLANT
GLOVE BIOGEL PI IND STRL 6.5 (GLOVE) ×1 IMPLANT
GLOVE BIOGEL PI IND STRL 7.0 (GLOVE) ×1 IMPLANT
GLOVE BIOGEL PI IND STRL 7.5 (GLOVE) ×1 IMPLANT
GLOVE BIOGEL PI INDICATOR 6.5 (GLOVE) ×2
GLOVE BIOGEL PI INDICATOR 7.0 (GLOVE) ×2
GLOVE BIOGEL PI INDICATOR 7.5 (GLOVE) ×2
GLOVE NEODERM STRL 7.5 LF PF (GLOVE) ×2 IMPLANT
GLOVE SURG NEODERM 7.5  LF PF (GLOVE) ×4
GLOVE SURG SS PI 6.5 STRL IVOR (GLOVE) ×6 IMPLANT
GLOVE SURG SS PI 7.0 STRL IVOR (GLOVE) ×3 IMPLANT
GLOVE SURG SYN 7.5  E (GLOVE) ×4
GLOVE SURG SYN 7.5 E (GLOVE) ×2 IMPLANT
GOWN STRL REIN XL XLG (GOWN DISPOSABLE) ×3 IMPLANT
GOWN STRL REUS W/ TWL LRG LVL3 (GOWN DISPOSABLE) ×3 IMPLANT
GOWN STRL REUS W/TWL LRG LVL3 (GOWN DISPOSABLE) ×6
KIT BASIN OR (CUSTOM PROCEDURE TRAY) ×3 IMPLANT
KIT ROOM TURNOVER OR (KITS) ×3 IMPLANT
NEEDLE 22X1 1/2 (OR ONLY) (NEEDLE) IMPLANT
NS IRRIG 1000ML POUR BTL (IV SOLUTION) ×3 IMPLANT
PACK ORTHO EXTREMITY (CUSTOM PROCEDURE TRAY) ×3 IMPLANT
PAD ARMBOARD 7.5X6 YLW CONV (MISCELLANEOUS) ×6 IMPLANT
PAD CAST 4YDX4 CTTN HI CHSV (CAST SUPPLIES) ×1 IMPLANT
PADDING CAST COTTON 4X4 STRL (CAST SUPPLIES) ×2
PEG LOCKING SMOOTH 2.2X12 (Peg) ×9 IMPLANT
PEG LOCKING SMOOTH 2.2X13 (Peg) ×3 IMPLANT
PEG LOCKING SMOOTH 2.2X14 (Peg) ×9 IMPLANT
PEG LOCKING SMOOTH 2.2X16 (Screw) ×12 IMPLANT
PEG LOCKING SMOOTH 2.2X18 (Peg) ×3 IMPLANT
PEG LOCKING SMOOTH 2.2X20 (Screw) ×6 IMPLANT
PEG LOCKING SMOOTH 2.2X22 (Screw) ×3 IMPLANT
PLATE DVR ULNA (Plate) ×3 IMPLANT
PLATE NARROW DVR LEFT (Plate) ×3 IMPLANT
PUTTY DBM STAGRAFT PLUS 2CC (Putty) ×3 IMPLANT
SCREW LOCKING 2.7MMX11MM (Screw) ×6 IMPLANT
SPONGE LAP 4X18 X RAY DECT (DISPOSABLE) IMPLANT
SUT ETHILON 3 0 PS 1 (SUTURE) ×6 IMPLANT
SUT ETHILON 4 0 PS 2 18 (SUTURE) ×3 IMPLANT
SUT MNCRL AB 4-0 PS2 18 (SUTURE) IMPLANT
SUT PROLENE 3 0 PS 2 (SUTURE) IMPLANT
SUT VIC AB 2-0 SH 27 (SUTURE) ×2
SUT VIC AB 2-0 SH 27XBRD (SUTURE) ×1 IMPLANT
SUT VIC AB 3-0 FS2 27 (SUTURE) IMPLANT
SYR CONTROL 10ML LL (SYRINGE) IMPLANT
SYSTEM CHEST DRAIN TLS 7FR (DRAIN) ×6 IMPLANT
TOWEL OR 17X24 6PK STRL BLUE (TOWEL DISPOSABLE) ×3 IMPLANT
TOWEL OR 17X26 10 PK STRL BLUE (TOWEL DISPOSABLE) ×3 IMPLANT
TUBE CONNECTING 12'X1/4 (SUCTIONS) ×1
TUBE CONNECTING 12X1/4 (SUCTIONS) ×2 IMPLANT
TUBING CYSTO DISP (UROLOGICAL SUPPLIES) ×3 IMPLANT
UNDERPAD 30X30 INCONTINENT (UNDERPADS AND DIAPERS) ×3 IMPLANT
WIRE K 1.6MM 144256 (MISCELLANEOUS) ×6 IMPLANT

## 2015-06-17 NOTE — Discharge Instructions (Signed)
Postoperative instructions: ° °Weightbearing: Non weight bearing ° °Dressing instructions: Keep your dressing and/or splint clean and dry at all times.  It will be removed at your first post-operative appointment.  Your stitches and/or staples will be removed at this visit. ° °Incision instructions:  Do not soak your incision for 3 weeks after surgery.  If the incision gets wet, pat dry and do not scrub the incision. ° °Pain control:  You have been given a prescription to be taken as directed for post-operative pain control.  In addition, elevate the operative extremity above the heart at all times to prevent swelling and throbbing pain. ° °Take over-the-counter Colace, 100mg by mouth twice a day while taking narcotic pain medications to help prevent constipation. ° °Follow up appointments: °1) 10-14 days for suture removal and wound check. °2) Dr. Iyla Balzarini as scheduled. ° ° ------------------------------------------------------------------------------------------------------------- ° °After Surgery Pain Control: ° °After your surgery, post-surgical discomfort or pain is likely. This discomfort can last several days to a few weeks. At certain times of the day your discomfort may be more intense.  °Did you receive a nerve block?  °A nerve block can provide pain relief for one hour to two days after your surgery. As long as the nerve block is working, you will experience little or no sensation in the area the surgeon operated on.  °As the nerve block wears off, you will begin to experience pain or discomfort. It is very important that you begin taking your prescribed pain medication before the nerve block fully wears off. Treating your pain at the first sign of the block wearing off will ensure your pain is better controlled and more tolerable when full-sensation returns. Do not wait until the pain is intolerable, as the medicine will be less effective. It is better to treat pain in advance than to try and catch up.    °General Anesthesia:  °If you did not receive a nerve block during your surgery, you will need to start taking your pain medication shortly after your surgery and should continue to do so as prescribed by your surgeon.  °Pain Medication:  °Most commonly we prescribe Vicodin and Percocet for post-operative pain. Both of these medications contain a combination of acetaminophen (Tylenol®) and a narcotic to help control pain.  °· It takes between 30 and 45 minutes before pain medication starts to work. It is important to take your medication before your pain level gets too intense.  °· Nausea is a common side effect of many pain medications. You will want to eat something before taking your pain medicine to help prevent nausea.  °· If you are taking a prescription pain medication that contains acetaminophen, we recommend that you do not take additional over the counter acetaminophen (Tylenol®).  °Other pain relieving options:  °· Using a cold pack to ice the affected area a few times a day (15 to 20 minutes at a time) can help to relieve pain, reduce swelling and bruising.  °· Elevation of the affected area can also help to reduce pain and swelling. ° ° ° °

## 2015-06-17 NOTE — Anesthesia Procedure Notes (Addendum)
Procedure Name: MAC Date/Time: 06/17/2015 11:17 AM Performed by: Rogelia BogaMUELLER, THOMAS P Oxygen Delivery Method: Simple face mask    Procedure Name: LMA Insertion Date/Time: 06/17/2015 11:33 AM Performed by: Rogelia BogaMUELLER, THOMAS P Pre-anesthesia Checklist: Patient identified, Emergency Drugs available, Suction available, Patient being monitored and Timeout performed Patient Re-evaluated:Patient Re-evaluated prior to inductionOxygen Delivery Method: Circle system utilized Preoxygenation: Pre-oxygenation with 100% oxygen Intubation Type: IV induction LMA: LMA inserted LMA Size: 5.0 Number of attempts: 1 Placement Confirmation: positive ETCO2 and breath sounds checked- equal and bilateral Tube secured with: Tape Dental Injury: Teeth and Oropharynx as per pre-operative assessment     Anesthesia Regional Block:  Axillary brachial plexus block  Pre-Anesthetic Checklist: ,, timeout performed, Correct Patient, Correct Site, Correct Laterality, Correct Procedure, Correct Position, site marked, Risks and benefits discussed,  Surgical consent,  Pre-op evaluation,  At surgeon's request and post-op pain management  Laterality: Left  Prep: chloraprep       Needles:  Injection technique: Single-shot  Needle Type: Echogenic Stimulator Needle     Needle Length: 9cm 9 cm Needle Gauge: 21 and 21 G    Additional Needles: Axillary brachial plexus block Narrative:  Start time: 06/17/2015 11:15 AM End time: 06/17/2015 11:20 AM Injection made incrementally with aspirations every 5 mL.  Performed by: Personally   Additional Notes: 35 cc 0.5% bupivacaine with 1:200 Epi injected easily

## 2015-06-17 NOTE — Op Note (Addendum)
   Date of Surgery: 06/17/2015  INDICATIONS: Ms. Janace HoardLeingang is a 81 y.o.-year-old female with a severely comminuted distal radius and ulna fractures;  The patient did consent to the procedure after discussion of the risks and benefits.  PREOPERATIVE DIAGNOSIS: Left distal radius and ulna fractures  POSTOPERATIVE DIAGNOSIS: Same.  PROCEDURE:  1. Open reduction internal fixation of left radius fracture, intraarticular 2. Open reduction internal fixation of distal ulna fracture, separate incision.  SURGEON: N. Glee ArvinMichael Daelynn Blower, M.D.  ASSIST: April Chilton SiGreen, RNFA.  ANESTHESIA:  general, regional  IV FLUIDS AND URINE: See anesthesia.  ESTIMATED BLOOD LOSS: minimal mL.  IMPLANTS: Biomet DVR crosslock narros and distal ulna plate  DRAINS: 1 TLS  COMPLICATIONS: None.  DESCRIPTION OF PROCEDURE: The patient was brought to the operating room and placed supine on the operating table.  The patient had been signed prior to the procedure and this was documented. The patient had the anesthesia placed by the anesthesiologist.  A time-out was performed to confirm that this was the correct patient, site, side and location. The patient did receive antibiotics prior to the incision and was re-dosed during the procedure as needed at indicated intervals.  A tourniquet placed.  The patient had the operative extremity prepped and draped in the standard surgical fashion.    A standard FCR approach to the volar wrist was utilized. The pronator quadratus was elevated off of the distal radius. The fracture was exposed. There was a severe amount of comminution and bone loss. Her bone quality was also severely poor. We were able to then obtained a reduction and this was held in place with a K wire. With the fracture reduced the plate was placed in the appropriate position using fluoroscopy. We placed a series of locking and nonlocking screws through the plate in order to buttress the fracture. She had a severe amount of  comminution of the articular surface in the metaphyseal region.  The distal screws were placed just subchondrally. Calcium phosphate bone graft was placed in the bony voids. We then turned our attention to the distal ulna fracture. A subcutaneous approach to the distal ulna was utilized. Subperiosteal elevation was performed. We then obtained a reduction of the fracture and this was held in place manually while we found the appropriate size of the plate. Of note her bone quality was also poor and there was bone loss. We then placed the appropriate size distal ulna plate and was able to place 2 nonlocking screws distally and 3 nonlocking screws proximally through the fracture. We also placed bone graft within the bony void there. Final x-rays were taken. The wounds were thoroughly irrigated with normal saline. The tourniquet was deflated. Hemostasis was obtained. A TLS drain was placed submuscularly. The wound was closed in layer fashion using 2-0 Vicryl and 3-0 nylon. Sterile dressings were applied. The arm was immobilized in a short arm splint. The patient tolerated the procedure well was x-rayed and transferred to the PACU in stable condition.  POSTOPERATIVE PLAN: The patient will be admitted overnight for observation pain control. She will be discharged in the morning.  Mayra ReelN. Michael Brealyn Baril, MD [facility] [phone] 1:24 PM

## 2015-06-17 NOTE — H&P (Signed)
PREOPERATIVE H&P  Chief Complaint: LEFT DISTAL RADIUS AND ULNA FRACTURES  HPI: Allison Shepard is a 81 y.o. female who presents for surgical treatment of LEFT DISTAL RADIUS AND ULNA FRACTURES.  She denies any changes in medical history.  Past Medical History  Diagnosis Date  . COPD (chronic obstructive pulmonary disease) (HCC)   . Atrial fibrillation (HCC)   . Hypertension   . Hypercholesteremia   . Pneumonia   . Diabetes mellitus without complication (HCC)     type 2  . Headache     migraines - years ago  . Complication of anesthesia     difficulty to wake up after an 8 hour back surgery   Past Surgical History  Procedure Laterality Date  . Abdominal hysterectomy    . Shoulder fusion surgery Right   . Knee surgery Right     knee replacement  . Hip surgery Right     hip replacement  . Back surgery    . Eye surgery      cataract surgery ? eye  . Fracture surgery      right arm 20 years ago  . Colonoscopy      x 3   Social History   Social History  . Marital Status: Married    Spouse Name: N/A  . Number of Children: N/A  . Years of Education: N/A   Social History Main Topics  . Smoking status: Former Smoker    Quit date: 06/15/1992  . Smokeless tobacco: Never Used  . Alcohol Use: 8.4 oz/week    0 Standard drinks or equivalent, 7 Glasses of wine, 7 Shots of liquor per week     Comment: daily  . Drug Use: No  . Sexual Activity: Not Asked   Other Topics Concern  . None   Social History Narrative   Family History  Problem Relation Age of Onset  . Heart disease Father    Allergies  Allergen Reactions  . Hydromorphone Hcl Nausea And Vomiting  . Ibuprofen Other (See Comments)    Pt was told by her MD not to take this medication.    . Mucinex [Guaifenesin Er] Other (See Comments)    Red all over. Felt like i was on fire  . Percocet [Oxycodone-Acetaminophen] Hives   Prior to Admission medications   Medication Sig Start Date End Date Taking?  Authorizing Provider  albuterol (PROVENTIL HFA;VENTOLIN HFA) 108 (90 BASE) MCG/ACT inhaler Inhale 2 puffs into the lungs every 6 (six) hours as needed for wheezing or shortness of breath.   Yes Historical Provider, MD  albuterol (PROVENTIL) (2.5 MG/3ML) 0.083% nebulizer solution Take 2.5 mg by nebulization every 6 (six) hours as needed for wheezing or shortness of breath.   Yes Historical Provider, MD  Cholecalciferol (VITAMIN D-3) 1000 UNITS CAPS Take 1,000 Units by mouth daily.   Yes Historical Provider, MD  digoxin (LANOXIN) 0.125 MG tablet Take 1 tablet (0.125 mg total) by mouth daily. 04/14/15  Yes Adrian Saran, MD  diltiazem (TIAZAC) 180 MG 24 hr capsule Take 1 capsule (180 mg total) by mouth daily. 04/14/15  Yes Sital Mody, MD  donepezil (ARICEPT) 5 MG tablet Take 5 mg by mouth at bedtime.   Yes Historical Provider, MD  fluticasone (FLONASE) 50 MCG/ACT nasal spray Place 1 spray into both nostrils daily. 03/20/15  Yes Auburn Bilberry, MD  furosemide (LASIX) 40 MG tablet Take 40 mg by mouth daily.   Yes Historical Provider, MD  HYDROcodone-acetaminophen (NORCO/VICODIN) 5-325 MG  tablet Take 1-2 tablets by mouth every 4 (four) hours as needed. 06/10/15  Yes Tatyana Kirichenko, PA-C  insulin aspart (NOVOLOG) 100 UNIT/ML injection Inject 8-12 Units into the skin 3 (three) times daily with meals. Below 200=8 units and every 50 units above she gets one extra unit up to 12 units   Yes Historical Provider, MD  insulin glargine (LANTUS) 100 UNIT/ML injection Inject 30 Units into the skin every morning.    Yes Historical Provider, MD  lisinopril (PRINIVIL,ZESTRIL) 5 MG tablet Take 5 mg by mouth daily.   Yes Historical Provider, MD  metoprolol succinate (TOPROL-XL) 100 MG 24 hr tablet Take 100 mg by mouth daily. Take with or immediately following a meal.   Yes Historical Provider, MD  nystatin (MYCOSTATIN) powder Apply 1 g topically 2 (two) times daily as needed.   Yes Historical Provider, MD  rivaroxaban  (XARELTO) 20 MG TABS tablet Take 20 mg by mouth daily.   Yes Historical Provider, MD  simvastatin (ZOCOR) 10 MG tablet Take 10 mg by mouth daily.   Yes Historical Provider, MD  zolpidem (AMBIEN) 5 MG tablet Take 5 mg by mouth at bedtime as needed for sleep.   Yes Historical Provider, MD  meloxicam (MOBIC) 7.5 MG tablet Take 1 tablet (7.5 mg total) by mouth 2 (two) times daily after a meal. Patient not taking: Reported on 06/09/2015 03/11/15   Tod PersiaWinston Parris, MD  oxymetazoline (AFRIN) 0.05 % nasal spray Place 1 spray into both nostrils 2 (two) times daily.    Historical Provider, MD  oxymetazoline (VICKS SINEX) 0.05 % nasal spray Place 1 spray into both nostrils 2 (two) times daily.    Historical Provider, MD     Positive ROS: All other systems have been reviewed and were otherwise negative with the exception of those mentioned in the HPI and as above.  Physical Exam: General: Alert, no acute distress Cardiovascular: No pedal edema Respiratory: No cyanosis, no use of accessory musculature GI: abdomen soft Skin: No lesions in the area of chief complaint Neurologic: Sensation intact distally Psychiatric: Patient is competent for consent with normal mood and affect Lymphatic: no lymphedema  MUSCULOSKELETAL: exam stable  Assessment: LEFT DISTAL RADIUS AND ULNA FRACTURES  Plan: Plan for Procedure(s): OPEN REDUCTION INTERNAL FIXATION LEFT  DISTAL RADIUS AND ULNA FRACTURES  The risks benefits and alternatives were discussed with the patient including but not limited to the risks of nonoperative treatment, versus surgical intervention including infection, bleeding, nerve injury,  blood clots, cardiopulmonary complications, morbidity, mortality, among others, and they were willing to proceed.   Cheral AlmasXu, Charvis Lightner Michael, MD   06/17/2015 10:59 AM

## 2015-06-17 NOTE — Anesthesia Preprocedure Evaluation (Addendum)
Anesthesia Evaluation  Patient identified by MRN, date of birth, ID band Patient awake    Reviewed: Allergy & Precautions, NPO status , Patient's Chart, lab work & pertinent test results, reviewed documented beta blocker date and time   Airway Mallampati: III  TM Distance: >3 FB Neck ROM: Full    Dental  (+) Teeth Intact, Dental Advisory Given   Pulmonary sleep apnea , COPD,  COPD inhaler, former smoker,     + decreased breath sounds      Cardiovascular hypertension, Pt. on medications and Pt. on home beta blockers  Rhythm:Irregular Rate:Normal     Neuro/Psych  Headaches,    GI/Hepatic GERD  Medicated and Controlled,  Endo/Other  diabetes, Type obesity  Renal/GU      Musculoskeletal   Abdominal (+) + obese,   Peds  Hematology   Anesthesia Other Findings   Reproductive/Obstetrics                           Anesthesia Physical Anesthesia Plan  ASA: III  Anesthesia Plan: MAC and Regional   Post-op Pain Management:    Induction:   Airway Management Planned: Mask  Additional Equipment:   Intra-op Plan:   Post-operative Plan:   Informed Consent: I have reviewed the patients History and Physical, chart, labs and discussed the procedure including the risks, benefits and alternatives for the proposed anesthesia with the patient or authorized representative who has indicated his/her understanding and acceptance.   Dental advisory given  Plan Discussed with: CRNA, Anesthesiologist and Surgeon  Anesthesia Plan Comments:         Anesthesia Quick Evaluation

## 2015-06-17 NOTE — Transfer of Care (Signed)
Immediate Anesthesia Transfer of Care Note  Patient: Allison HeadsMargaret M Esquilin  Procedure(s) Performed: Procedure(s): OPEN REDUCTION INTERNAL FIXATION LEFT  DISTAL RADIUS AND ULNA FRACTURES (Left)  Patient Location: PACU  Anesthesia Type:General and GA combined with regional for post-op pain  Level of Consciousness: awake, alert , oriented and patient cooperative  Airway & Oxygen Therapy: Patient Spontanous Breathing and Patient connected to nasal cannula oxygen  Post-op Assessment: Report given to RN, Post -op Vital signs reviewed and stable and Patient moving all extremities  Post vital signs: Reviewed and stable  Last Vitals:  Filed Vitals:   06/17/15 1339  BP: 121/92  Pulse: 99  Temp:   Resp: 21    Complications: No apparent anesthesia complications

## 2015-06-17 NOTE — Anesthesia Postprocedure Evaluation (Signed)
  Anesthesia Post-op Note  Patient: Allison HeadsMargaret M Bernstein  Procedure(s) Performed: Procedure(s): OPEN REDUCTION INTERNAL FIXATION LEFT  DISTAL RADIUS AND ULNA FRACTURES (Left)  Patient Location: PACU  Anesthesia Type:General and GA combined with regional for post-op pain  Level of Consciousness: awake, alert  and oriented  Airway and Oxygen Therapy: Patient Spontanous Breathing and Patient connected to nasal cannula oxygen  Post-op Pain: none  Post-op Assessment: Post-op Vital signs reviewed, Patient's Cardiovascular Status Stable, Respiratory Function Stable, Patent Airway and Pain level controlled              Post-op Vital Signs: stable  Last Vitals:  Filed Vitals:   06/17/15 1500  BP: 127/80  Pulse: 81  Temp:   Resp: 18    Complications: No apparent anesthesia complications

## 2015-06-18 ENCOUNTER — Encounter (HOSPITAL_COMMUNITY): Payer: Self-pay | Admitting: General Practice

## 2015-06-18 DIAGNOSIS — S52592A Other fractures of lower end of left radius, initial encounter for closed fracture: Secondary | ICD-10-CM | POA: Diagnosis not present

## 2015-06-18 LAB — GLUCOSE, CAPILLARY
Glucose-Capillary: 145 mg/dL — ABNORMAL HIGH (ref 65–99)
Glucose-Capillary: 169 mg/dL — ABNORMAL HIGH (ref 65–99)
Glucose-Capillary: 117 mg/dL — ABNORMAL HIGH (ref 65–99)
Glucose-Capillary: 134 mg/dL — ABNORMAL HIGH (ref 65–99)

## 2015-06-18 NOTE — Clinical Social Work Note (Signed)
CSW contacted patient daughter to inform her that patient is only on observation status and would not be able to use Medicare benefits for SNF.  CSW informed daughter if they wanted the patient to go to SNF they would have to private pay.  Patient's daughter expressed the patient and her husband can not afford to private pay.  CSW told the daughter the other option is to go home with home health and private pay for caregivers in the home.  Patient's daughter expressed they will look at trying to make arrangements for home health.  Patient's daughter agreed, CSW contacted case Production designer, theatre/television/filmmanager.  Ervin KnackEric R. Josealfredo Adkins, MSW, Theresia MajorsLCSWA [phone] 06/18/2015 4:07 PM

## 2015-06-18 NOTE — Progress Notes (Signed)
Occupational Therapy Evaluation Patient Details Name: Allison Shepard MRN: [id number] DOB: November 02, 1933 Today's Date: 06/18/2015    History of Present Illness 81 y.o. female who presents for surgical treatment of LEFT DISTAL RADIUS AND ULNA FRACTURES. Pt with PMHx: of COPD, aFib, HTN   Clinical Impression   Patient presents to OT with decreased ADL independence and safety due to the deficits listed below. Patient's spouse is available to assist but he reports they just moved and the house is still "a mess." Patient reported more than once on evaluation that she falls out of bed, but then in the next sentence she states she has only fallen once this year. Unclear of true PLOF. Patient with decreased balance, strength, endurance, and function and needs constant reminders to not push through LUE. She would benefit from SNF for rehab, but that is not an available option at this time. Therefore, recommend HHPT/OT/aide at home, assistance with all ADLs and mobility. Will also request PT consult to assist in determining appropriate assistive device given that she normally uses RW and now cannot use RW due to NWB LUE. OT will follow.    Follow Up Recommendations  Home health OT;Supervision/Assistance - 24 hour;Other (comment) (home health aide)    Equipment Recommendations       Recommendations for Other Services PT consult     Precautions / Restrictions Precautions Precautions: Fall Precaution Comments: arm fx was due to a fall Restrictions Weight Bearing Restrictions: Yes LUE Weight Bearing: Non weight bearing      Mobility Bed Mobility Overal bed mobility: Needs Assistance Bed Mobility: Supine to Sit   Supine to sit: Mod assist;Max assist       Transfers Overall transfer level: Needs assistance Equipment used: 1 person hand held assist Transfers: Sit to/from Stand;Stand Pivot Transfers Sit to Stand: Min assist;Mod assist Stand pivot transfers: Min assist        General transfer comment: cues for hand placement and safety from bed, recliner and chair without armrests    Balance                                         ADL Overall ADL's : Needs assistance/impaired Eating/Feeding: Minimal assistance;Bed level;Sitting   Grooming: Moderate assistance;Sitting   Upper Body Bathing: Maximal assistance;Total assistance;Sitting;Bed level   Lower Body Bathing: Total assistance;Sit to/from stand;Bed level   Upper Body Dressing : Maximal assistance;Total assistance;Sitting;Bed level   Lower Body Dressing: Maximal assistance;Total assistance;Sit to/from stand;Bed level   Toilet Transfer: Minimal assistance;Moderate assistance;Stand-pivot;Cueing for safety;BSC   Toileting- Clothing Manipulation and Hygiene: Moderate assistance;Sit to/from stand       Functional mobility during ADLs: Minimal assistance;Moderate assistance General ADL Comments: Patient educated on NWB LUE. Despite this education, she required mod/max cues to not weight bear through L arm during session. Patient had great difficulty with bed mobility. She was unsteady upon standing from the bed and pivoting to and from [facility]. She kept trying to use LUE to push and I had to assist her with verbal and tactile cues to not push through LUE. It was difficult to get reliable information about her PLOF/home setup from her and her spouse. Spouse mostly disengaged unless asked a specific question. He repeatedly agreed that he would help her with ADLs, but he did not attempt to assist at all during OT session. Educated patient to sponge bathe for now and use BSC for  toileting. Educated them about SNF, which they admantly refuse. Educated them about home health services. Initially, pt refused this, but reluctantly agreed after further explanation. Patient assisted back to bed at end of session.     Vision     Perception     Praxis      Pertinent Vitals/Pain Pain Assessment:  0-10 Pain Score: 4  Pain Location: L wrist/hand Pain Descriptors / Indicators: Aching;Discomfort Pain Intervention(s): Limited activity within patient's tolerance;Monitored during session     Hand Dominance Right   Extremity/Trunk Assessment Upper Extremity Assessment Upper Extremity Assessment: LUE deficits/detail;Generalized weakness LUE Deficits / Details: L wrist in cast/rigid splint LUE: Unable to fully assess due to immobilization   Lower Extremity Assessment Lower Extremity Assessment: Generalized weakness   Cervical / Trunk Assessment Cervical / Trunk Assessment: Kyphotic   Communication Communication Communication: No difficulties   Cognition Arousal/Alertness: Awake/alert Behavior During Therapy: Flat affect Overall Cognitive Status: Impaired/Different from baseline Area of Impairment: Orientation;Memory;Safety/judgement;Problem solving Orientation Level: Disoriented to;Time   Memory: Decreased short-term memory   Safety/Judgement: Decreased awareness of safety;Decreased awareness of deficits   Problem Solving: Slow processing;Difficulty sequencing General Comments: pt looking to husband for answers to check to see if her responses were correct and at times both changing their responses throughout session. Question safety awareness of both pt and spouse.    General Comments       Exercises       Shoulder Instructions      Home Living Family/patient expects to be discharged to:: Private residence Living Arrangements: Spouse/significant other Available Help at Discharge: Family;Available 24 hours/day Type of Home: House Home Access: Stairs to enter Entergy CorporationEntrance Stairs-Number of Steps: 2 Entrance Stairs-Rails: None Home Layout: One level     Bathroom Shower/Tub: Producer, television/film/videoWalk-in shower   Bathroom Toilet: Handicapped height Bathroom Accessibility: Yes How Accessible: Accessible via walker Home Equipment: Toilet riser;Shower seat - built in;Walker - 4  wheels;Bedside commode;Cane - single point          Prior Functioning/Environment Level of Independence: Independent with assistive device(s)        Comments: uses RW most of the time    OT Diagnosis: Generalized weakness;Acute pain;Cognitive deficits   OT Problem List: Decreased strength;Decreased range of motion;Decreased activity tolerance;Impaired balance (sitting and/or standing);Decreased cognition;Decreased safety awareness;Decreased knowledge of use of DME or AE;Decreased knowledge of precautions;Cardiopulmonary status limiting activity;Obesity;Impaired UE functional use;Pain   OT Treatment/Interventions: Self-care/ADL training;Therapeutic exercise;DME and/or AE instruction;Therapeutic activities;Patient/family education    OT Goals(Current goals can be found in the care plan section) Acute Rehab OT Goals Patient Stated Goal: go home OT Goal Formulation: With patient Time For Goal Achievement: 07/02/15 Potential to Achieve Goals: Fair  OT Frequency: Min 2X/week   Barriers to D/C:            Co-evaluation              End of Session Equipment Utilized During Treatment: Oxygen Nurse Communication: Mobility status  Activity Tolerance: Patient limited by fatigue;Patient limited by lethargy Patient left: in bed;with call bell/phone within reach;with family/visitor present   Time: [id number] OT Time Calculation (min): 39 min Charges:  OT General Charges $OT Visit: 1 Procedure OT Evaluation $Initial OT Evaluation Tier I: 1 Procedure OT Treatments $Self Care/Home Management : 8-22 mins $Therapeutic Activity: 8-22 mins G-Codes: OT G-codes **NOT FOR INPATIENT CLASS** Functional Assessment Tool Used: clinical reasoning Functional Limitation: Self care Self Care Current Status ([id number]): At least 60 percent but less than 80 percent impaired, limited  or restricted Self Care Goal Status [phone]): At least 40 percent but less than 60 percent impaired, limited or  restricted  Nyella Eckels A 06/18/2015, 11:59 AM

## 2015-06-18 NOTE — Care Management Note (Addendum)
Case Management Note  Patient Details  Name: [name] MRN: [id number] Date of Birth: [birth year]  Subjective/Objective:    81 yr old female s/p fall. Patient underwent a left radius ORIF and a left humerus ORIF.                 Action/Plan: Case manager spoke with patient concerning discharge plans and home health needs. Choice was offered. [facility] [place] [facility] [place].   Patient has not done well with therapy, CM discussed this with patient and her husband as well as Child psychotherapistsocial worker. Patient can not afford to pay privately for SNF. Plan is for Home with HH. Case manager spoke with patient's daughter [name]- Leslie Peregoy- [phone]- [phone] concerning discharge plan. CM also provided private duty agency lists.   Expected Discharge Date:    06/19/15              Expected Discharge Plan:   Home with home health  In-House Referral:     Discharge planning Services  CM Consult  Post Acute Care Choice:  Durable Medical Equipment, Home Health Choice offered to:  Patient  DME Arranged:  3-N-1, hemiwalker DME Agency:  [facility]  HH Arranged:  PT, OT, Nurse's Aide, Social Work [employer] Agency:     Status of Service:  Completed.  Medicare Important Message Given:    Date Medicare IM Given:    Medicare IM give by:    Date Additional Medicare IM Given:    Additional Medicare Important Message give by:     If discussed at Long Length of Stay Meetings, dates discussed:    Additional Comments:  Durenda GuthrieBrady, [name], RN 06/18/2015, 12:07 PM

## 2015-06-18 NOTE — Progress Notes (Signed)
Patient pulled out red test tube drain. Nursing will continue to monitor.

## 2015-06-18 NOTE — Evaluation (Signed)
Physical Therapy Evaluation Patient Details Name: Allison Shepard MRN: [id number] DOB: May 03, 1934 Today's Date: 06/18/2015   History of Present Illness  81 y.o. female who presents for surgical treatment of LEFT DISTAL RADIUS AND ULNA FRACTURES. Pt with PMHx: of COPD, aFib, HTN  Clinical Impression  Pt in bed with oxygen off on arrival with sats 85%, pt disoriented to date. Pt and spouse state they moved a week ago and the house has been a mess and pt has not been using her oxygen with moving around at home. Sats dropped to 83% with 50' of gait on 2L and required 1 min seated rest to recover to 93%. Pt clearly states she does not move much at home and travels from one chair to the next to accomplish tasks as well as having a chair in front of the fridge to sit and get items out. Pt states she falls out of bed but then in next sentence states she has only fallen once this year. Unclear of true PLOF. Pt with decreased balance, strength, endurance and function and needs constant reminders not to use LUE. Pt would benefit from ST-SNF and increased assist however not an available option at this time and therefore recommend HHPT, hemiwalker, supervision for all mobility, use to oxygen with mobility, and additional DME as stated by OT. Will follow acutely to maximize mobility, function and safety to decrease burden of care.     Follow Up Recommendations Home health PT;Supervision/Assistance - 24 hour    Equipment Recommendations  Other (comment) (hemi walker)    Recommendations for Other Services       Precautions / Restrictions Precautions Precautions: Fall Precaution Comments: arm fx was due to a fall Restrictions Weight Bearing Restrictions: Yes LUE Weight Bearing: Non weight bearing      Mobility  Bed Mobility Overal bed mobility: Needs Assistance Bed Mobility: Rolling;Sidelying to Sit Rolling: Min assist Sidelying to sit: Min assist       General bed mobility comments: pt  attempted to pivot to EOB and elevate trunk but was unable. Max cues to roll with pushing with LLE in bed to get to side and then push up with RUE to sitting, Increased time and momentum to achieve  Transfers Overall transfer level: Needs assistance   Transfers: Sit to/from Stand Sit to Stand: Min guard         General transfer comment: cues for hand placement and safety from bed, recliner and chair without armrests  Ambulation/Gait Ambulation/Gait assistance: Min guard Ambulation Distance (Feet): 50 Feet Assistive device: Crutches (single crutch) Gait Pattern/deviations: Step-through pattern;Decreased stride length;Trunk flexed;Narrow base of support   Gait velocity interpretation: Below normal speed for age/gender General Gait Details: pt with anxiety with gait, stating fatigue and rushing to get to surface. pt with sats dropping to 83% on 2L with gait and not utilizing crutch with gait. Guarding throughout for balance, cues for breathing and safety  Stairs            Wheelchair Mobility    Modified Rankin (Stroke Patients Only)       Balance Overall balance assessment: Needs assistance   Sitting balance-Leahy Scale: Good       Standing balance-Leahy Scale: Fair                               Pertinent Vitals/Pain Pain Assessment: No/denies pain Pain Score: 4  Pain Location: L wrist/hand Pain Descriptors / Indicators:  Aching;Discomfort Pain Intervention(s): Limited activity within patient's tolerance;Monitored during session    Home Living Family/patient expects to be discharged to:: Private residence Living Arrangements: Spouse/significant other Available Help at Discharge: Family;Available 24 hours/day Type of Home: House Home Access: Stairs to enter Entrance Stairs-Rails: None Entrance Stairs-Number of Steps: 2 Home Layout: One level Home Equipment: Toilet riser;Shower seat - built in;Walker - 4 wheels;Bedside commode;Cane - single  point      Prior Function Level of Independence: Independent with assistive device(s)         Comments: uses Rollator most of the time     Hand Dominance   Dominant Hand: Right    Extremity/Trunk Assessment   Upper Extremity Assessment: Generalized weakness           Lower Extremity Assessment: Generalized weakness      Cervical / Trunk Assessment: Kyphotic  Communication   Communication: No difficulties  Cognition Arousal/Alertness: Awake/alert Behavior During Therapy: Flat affect Overall Cognitive Status: Impaired/Different from baseline Area of Impairment: Orientation;Memory;Safety/judgement;Problem solving Orientation Level: Disoriented to;Time   Memory: Decreased short-term memory   Safety/Judgement: Decreased awareness of safety;Decreased awareness of deficits   Problem Solving: Slow processing;Difficulty sequencing General Comments: pt looking to husband for answers to check to see if her responses were correct and at times both changing their responses throughout session. Question safety awareness of both pt and spouse.     General Comments      Exercises        Assessment/Plan    PT Assessment Patient needs continued PT services  PT Diagnosis Difficulty walking;Generalized weakness   PT Problem List Decreased strength;Decreased cognition;Decreased activity tolerance;Decreased balance;Decreased knowledge of use of DME;Decreased mobility  PT Treatment Interventions Gait training;DME instruction;Stair training;Functional mobility training;Therapeutic activities;Patient/family education;Balance training;Cognitive remediation   PT Goals (Current goals can be found in the Care Plan section) Acute Rehab PT Goals Patient Stated Goal: go home PT Goal Formulation: With patient/family Time For Goal Achievement: 07/02/15 Potential to Achieve Goals: Fair    Frequency Min 3X/week   Barriers to discharge Decreased caregiver support      Co-evaluation                End of Session Equipment Utilized During Treatment: Gait belt Activity Tolerance: Patient tolerated treatment well Patient left: in chair;with call bell/phone within reach;with family/visitor present Nurse Communication: Mobility status         Time: [id number] PT Time Calculation (min) (ACUTE ONLY): 24 min   Charges:   PT Evaluation $Initial PT Evaluation Tier I: 1 Procedure PT Treatments $Therapeutic Activity: 8-22 mins   PT G CodesDelorse Lek:        Tabor, Claryce Friel Beth 06/18/2015, 11:33 AM Delaney MeigsMaija Tabor Bellamia Ferch, PT [phone]

## 2015-06-18 NOTE — Progress Notes (Signed)
   Subjective:  Patient reports pain as mild.  Pulled out TLS drain on her own.  Objective:   VITALS:   Filed Vitals:   06/17/15 1837 06/17/15 2006 06/18/15 0035 06/18/15 0503  BP: 116/61 134/88 133/72 127/74  Pulse: 71 64 99 94  Temp: 97.8 F (36.6 C) 97.6 F (36.4 C) 99.4 F (37.4 C) 98.8 F (37.1 C)  TempSrc: Oral Oral Oral Oral  Resp: 16 16 16 16   SpO2: 97% 99% 93% 92%    Neurologically intact Neurovascular intact Sensation intact distally Intact pulses distally  Fingers wwp   Lab Results  Component Value Date   WBC 6.9 06/17/2015   HGB 12.8 06/17/2015   HCT 39.2 06/17/2015   MCV 102.1* 06/17/2015   PLT 206 06/17/2015     Assessment/Plan:  1 Day Post-Op   - up with OT today  - dc home after OT - Rx in chart  Cheral AlmasXu, Yasenia Reedy Michael 06/18/2015, 7:41 AM [phone]

## 2015-06-18 NOTE — Progress Notes (Signed)
   06/18/15 1235  What Happened  Was fall witnessed? Yes  Who witnessed fall? Nafeesah Lapaglia--RN  Patients activity before fall to/from bed, chair, or stretcher (To BSC from chair)  Point of contact buttocks  Was patient injured? No  Adult Fall Risk Assessment  Risk Factor Category (scoring not indicated) High fall risk per protocol (document High fall risk)  Patient's Fall Risk High Fall Risk (>13 points)  Adult Fall Risk Interventions  Required Bundle Interventions *See Row Information* High fall risk - low, moderate, and high requirements implemented  Additional Interventions Fall risk signage;Individualized elimination schedule;Pharmacy review of medications;PT/OT need assessed if change in mobility from baseline;Secure all tubes/drains;Use of appropriate toileting equipment (bedpan, BSC, etc.)  Vitals  Temp 98.7 F (37.1 C)  Temp Source Oral  BP 105/73 mmHg  BP Location Right Arm  BP Method Automatic  Patient Position (if appropriate) Lying  Pulse Rate (!) 103  Cardiac Rhythm Atrial fibrillation  Resp 18  Oxygen Therapy  SpO2 98 %  O2 Device Nasal Cannula  O2 Flow Rate (L/min) 3 L/min  Pain Assessment  Pain Assessment No/denies pain  Pain Score 0  PCA/Epidural/Spinal Assessment  Respiratory Pattern Regular;Unlabored   Pt was sitting in recliner and stated that she needed to use the bathroom. She had ambulated earlier in the morning with PT and was able to ambulate in the hallway with single crutch and [facility]. This RN and pt attempted 3 times to have pt stand up and each time she sat back down on the recliner because she could not support her weight. On the third attempt she sat down too close to the edge of the recliner and slid forward. Since she could not support herself, RN was holding her up and had to guide her down to the floor. Pt sat down on her buttocks while I continued to support her head and upper body.  I was unable to reach the call bell because I moved it to stand her up. The CM sitting outside heard the encounter and came in to offer assistance. The pt was too heavy for both of us to lift together so several other staff members came in and helped move the pt back to bed. VSS, pt denied having pain, alert and oriented, no new skin issues. When pts husband arrived back to unit shortly after, was updated on situation. Dr. Roda ShuttersXu was called x2, but has not returned call as of yet.   Prior to this incident, PT and OT had determined it was not safe for pt to return home because of decreased safety awareness and impulsiveness. Dr. Roda ShuttersXu was made aware and said it was ok for pt to stay until tomorrow. Will continue to monitor pt.  Sedonia SmallKatelin E Annaleigh Steinmeyer

## 2015-06-19 DIAGNOSIS — S52592A Other fractures of lower end of left radius, initial encounter for closed fracture: Secondary | ICD-10-CM | POA: Diagnosis not present

## 2015-06-19 LAB — GLUCOSE, CAPILLARY
GLUCOSE-CAPILLARY: 162 mg/dL — AB (ref 65–99)
Glucose-Capillary: 125 mg/dL — ABNORMAL HIGH (ref 65–99)

## 2015-06-19 NOTE — Discharge Summary (Signed)
Physician Discharge Summary      Patient ID: Allison Shepard MRN: [id number] DOB/AGE: 79-May-1935 79 y.o.  Admit date: 06/17/2015 Discharge date: 06/19/2015  Admission Diagnoses:  <principal problem not specified>  Discharge Diagnoses:  Active Problems:   Fracture of radius, distal, with ulna, left, closed   S/P ORIF (open reduction internal fixation) fracture   Past Medical History  Diagnosis Date  . COPD (chronic obstructive pulmonary disease) (HCC)   . Atrial fibrillation (HCC)   . Hypertension   . Hypercholesteremia   . Pneumonia   . Diabetes mellitus without complication (HCC)     type 2  . Headache     migraines - years ago  . Complication of anesthesia     difficulty to wake up after an 8 hour back surgery    Surgeries: Procedure(s): OPEN REDUCTION INTERNAL FIXATION LEFT  DISTAL RADIUS AND ULNA FRACTURES on 06/17/2015   Consultants (if any):    Discharged Condition: Improved  Hospital Course: GEARLENE GODSIL is an 81 y.o. female who was admitted 06/17/2015 with a diagnosis of <principal problem not specified> and went to the operating room on 06/17/2015 and underwent the above named procedures.    She was given perioperative antibiotics:  Anti-infectives    Start     Dose/Rate Route Frequency Ordered Stop   06/17/15 1730  ceFAZolin (ANCEF) IVPB 2 g/50 mL premix     2 g 100 mL/hr over 30 Minutes Intravenous Every 6 hours 06/17/15 1727 06/18/15 0807   06/17/15 1130  ceFAZolin (ANCEF) IVPB 2 g/50 mL premix     2 g 100 mL/hr over 30 Minutes Intravenous To [facility]Stay [facility] 06/16/15 1427 06/17/15 1120    .  She was given sequential compression devices, early ambulation for DVT prophylaxis.  She benefited maximally from the hospital stay and there were no complications.    Recent vital signs:  Filed Vitals:   06/19/15 0447  BP: 107/58  Pulse: 69  Temp: 97.9 F (36.6 C)  Resp: 18    Recent laboratory studies:  Lab Results  Component Value  Date   HGB 12.8 06/17/2015   HGB 10.4* 04/08/2015   HGB 11.3* 04/07/2015   Lab Results  Component Value Date   WBC 6.9 06/17/2015   PLT 206 06/17/2015   Lab Results  Component Value Date   INR 1.18 06/17/2015   Lab Results  Component Value Date   NA 141 06/17/2015   K 4.0 06/17/2015   CL 109 06/17/2015   CO2 22 06/17/2015   BUN 18 06/17/2015   CREATININE 1.19* 06/17/2015   GLUCOSE 150* 06/17/2015    Discharge Medications:     Medication List    STOP taking these medications        HYDROcodone-acetaminophen 5-325 MG tablet  Commonly known as:  NORCO/VICODIN  Replaced by:  HYDROcodone-acetaminophen 10-325 MG tablet      TAKE these medications        albuterol (2.5 MG/3ML) 0.083% nebulizer solution  Commonly known as:  PROVENTIL  Take 2.5 mg by nebulization every 6 (six) hours as needed for wheezing or shortness of breath.     albuterol 108 (90 BASE) MCG/ACT inhaler  Commonly known as:  PROVENTIL HFA;VENTOLIN HFA  Inhale 2 puffs into the lungs every 6 (six) hours as needed for wheezing or shortness of breath.     digoxin 0.125 MG tablet  Commonly known as:  LANOXIN  Take 1 tablet (0.125 mg total) by mouth daily.  diltiazem 180 MG 24 hr capsule  Commonly known as:  TIAZAC  Take 1 capsule (180 mg total) by mouth daily.     donepezil 5 MG tablet  Commonly known as:  ARICEPT  Take 5 mg by mouth at bedtime.     fluticasone 50 MCG/ACT nasal spray  Commonly known as:  FLONASE  Place 1 spray into both nostrils daily.     furosemide 40 MG tablet  Commonly known as:  LASIX  Take 40 mg by mouth daily.     HYDROcodone-acetaminophen 10-325 MG tablet  Commonly known as:  NORCO  Take 1-2 tablets by mouth every 6 (six) hours as needed.     insulin aspart 100 UNIT/ML injection  Commonly known as:  novoLOG  Inject 8-12 Units into the skin 3 (three) times daily with meals. Below 200=8 units and every 50 units above she gets one extra unit up to 12 units      insulin glargine 100 UNIT/ML injection  Commonly known as:  LANTUS  Inject 30 Units into the skin every morning.     lisinopril 5 MG tablet  Commonly known as:  PRINIVIL,ZESTRIL  Take 5 mg by mouth daily.     meloxicam 7.5 MG tablet  Commonly known as:  MOBIC  Take 1 tablet (7.5 mg total) by mouth 2 (two) times daily after a meal.     metoprolol succinate 100 MG 24 hr tablet  Commonly known as:  TOPROL-XL  Take 100 mg by mouth daily. Take with or immediately following a meal.     nystatin powder  Commonly known as:  MYCOSTATIN  Apply 1 g topically 2 (two) times daily as needed.     rivaroxaban 20 MG Tabs tablet  Commonly known as:  XARELTO  Take 20 mg by mouth daily.     simvastatin 10 MG tablet  Commonly known as:  ZOCOR  Take 10 mg by mouth daily.     VICKS SINEX 0.05 % nasal spray  Generic drug:  oxymetazoline  Place 1 spray into both nostrils 2 (two) times daily.     oxymetazoline 0.05 % nasal spray  Commonly known as:  AFRIN  Place 1 spray into both nostrils 2 (two) times daily.     Vitamin D-3 1000 UNITS Caps  Take 1,000 Units by mouth daily.     zolpidem 5 MG tablet  Commonly known as:  AMBIEN  Take 5 mg by mouth at bedtime as needed for sleep.        Diagnostic Studies: Dg Wrist 2 Views Left  06/09/2015  CLINICAL DATA:  Post reduction of the left wrist fracture. EXAM: LEFT WRIST - 2 VIEW COMPARISON:  06/09/2015 FINDINGS: Again noted are fractures involving the distal radius and ulna. The wrist and forearm is now within a cast or splint. Radiocarpal joint is poorly characterized on the lateral view. Distal radial fracture is comminuted. Overall alignment of the wrist has slightly improved on the lateral view appear but the bone detail is limited. IMPRESSION: Limited evaluation of the radiocarpal joint and distal radius on the lateral view. Again noted are fractures involving the distal radius and ulna. Electronically Signed   By: Richarda Overlie M.D.   On: 06/09/2015  22:59   Dg Wrist Complete Left  06/09/2015  CLINICAL DATA:  Fall.  Wrist pain. EXAM: LEFT WRIST - COMPLETE 3+ VIEW COMPARISON:  None. FINDINGS: There are fractures of the distal left radius and ulna. The radial fracture is comminuted, with the  primary fracture component across the metaphysis, but with secondary fracture lines extending to the articular surface of the radius. The primary distal fracture component is displaced in a volar direction by a 8 mm, as well as being impacted. The carpus displaces with the distal radial primary fracture component. There is also a comminuted fracture of the distal ulna across the metaphysis without significant displacement. There is no dislocation. Bones are diffusely demineralized. There is diffuse surrounding soft tissue swelling. IMPRESSION: 1. Fractures of the distal left radius and ulna. 2. Distal radial fracture is displaced and comminuted as well as impacted. Electronically Signed   By: Amie Portland M.D.   On: 06/09/2015 20:41   Dg Wrist Complete Right  06/09/2015  CLINICAL DATA:  Fall today.  Complains of bilateral wrist pain. EXAM: RIGHT WRIST - COMPLETE 3+ VIEW COMPARISON:  None. FINDINGS: Spurring and subchondral sclerosis in the first carpometacarpal joint compatible with degenerative changes. Well corticated bone fragment along the ulnar styloid suggesting an old injury. Mild deformity of the distal radius may be related to an old injury. No evidence for acute fracture. Soft tissues are unremarkable. Wrist is located. Carpal bones are intact. Chondrocalcinosis at the wrist joint. IMPRESSION: No acute bone abnormality involving the right wrist. Electronically Signed   By: Richarda Overlie M.D.   On: 06/09/2015 20:42   Ct Head Wo Contrast  06/09/2015  CLINICAL DATA:  Per EMS, Pt, from home, c/o L wrist injury and R wrist pain after falling this evening. Pain score 10/10. Deformity noted to L wrist. EXAM: CT HEAD WITHOUT CONTRAST CT CERVICAL SPINE WITHOUT  CONTRAST TECHNIQUE: Multidetector CT imaging of the head and cervical spine was performed following the standard protocol without intravenous contrast. Multiplanar CT image reconstructions of the cervical spine were also generated. COMPARISON:  04/06/2015.  Cervical spine, 12/08/2011. FINDINGS: CT HEAD FINDINGS The ventricles normal configuration. There is ventricular and sulcal enlargement reflecting mild, stable, age related atrophy. No hydrocephalus. There are no parenchymal masses or mass effect. There is no evidence of cortical infarct. Patchy white matter hypoattenuation is noted consistent with moderate chronic microvascular ischemic change. There is a widened extra-axial space along the anterior inferior left temporal lobe, stable, likely an arachnoid cyst. No other extra-axial abnormalities. There is no intracranial hemorrhage. There is fluid attenuation throughout most of the left mastoid air cells likely a chronic benign effusion. Right mastoid air cells are clear as are the visualized sinuses. No skull fracture evident. CT CERVICAL SPINE FINDINGS No fracture. No spondylolisthesis. Mild reversal of the normal cervical lordosis with the apex at C5. There is moderate loss disc height at C5-C6 and C6-C7 where there is endplate spurring. There is facet degenerative change bilaterally. Neural foraminal narrowing is noted common most evident on the left at C5-C6, moderate severity. Bones are diffusely demineralized. Soft tissue evaluation is unremarkable.  Lung apices are clear. IMPRESSION: HEAD CT: No acute intracranial abnormalities. Mild atrophy and moderate chronic microvascular ischemic change. CERVICAL CT:  No fracture or acute finding. Electronically Signed   By: Amie Portland M.D.   On: 06/09/2015 20:30   Ct Cervical Spine Wo Contrast  06/09/2015  CLINICAL DATA:  Per EMS, Pt, from home, c/o L wrist injury and R wrist pain after falling this evening. Pain score 10/10. Deformity noted to L wrist.  EXAM: CT HEAD WITHOUT CONTRAST CT CERVICAL SPINE WITHOUT CONTRAST TECHNIQUE: Multidetector CT imaging of the head and cervical spine was performed following the standard protocol without intravenous contrast. Multiplanar CT  image reconstructions of the cervical spine were also generated. COMPARISON:  04/06/2015.  Cervical spine, 12/08/2011. FINDINGS: CT HEAD FINDINGS The ventricles normal configuration. There is ventricular and sulcal enlargement reflecting mild, stable, age related atrophy. No hydrocephalus. There are no parenchymal masses or mass effect. There is no evidence of cortical infarct. Patchy white matter hypoattenuation is noted consistent with moderate chronic microvascular ischemic change. There is a widened extra-axial space along the anterior inferior left temporal lobe, stable, likely an arachnoid cyst. No other extra-axial abnormalities. There is no intracranial hemorrhage. There is fluid attenuation throughout most of the left mastoid air cells likely a chronic benign effusion. Right mastoid air cells are clear as are the visualized sinuses. No skull fracture evident. CT CERVICAL SPINE FINDINGS No fracture. No spondylolisthesis. Mild reversal of the normal cervical lordosis with the apex at C5. There is moderate loss disc height at C5-C6 and C6-C7 where there is endplate spurring. There is facet degenerative change bilaterally. Neural foraminal narrowing is noted common most evident on the left at C5-C6, moderate severity. Bones are diffusely demineralized. Soft tissue evaluation is unremarkable.  Lung apices are clear. IMPRESSION: HEAD CT: No acute intracranial abnormalities. Mild atrophy and moderate chronic microvascular ischemic change. CERVICAL CT:  No fracture or acute finding. Electronically Signed   By: Amie Portlandavid  Ormond M.D.   On: 06/09/2015 20:30    Disposition: 01-Home or Self Care      Discharge Instructions    Call MD / Call 911    Complete by:  As directed   If you experience  chest pain or shortness of breath, CALL 911 and be transported to the hospital emergency room.  If you develope a fever above 101.5 F, pus (white drainage) or increased drainage or redness at the wound, or calf pain, call your surgeon's office.     Constipation Prevention    Complete by:  As directed   Drink plenty of fluids.  Prune juice may be helpful.  You may use a stool softener, such as Colace (over the counter) 100 mg twice a day.  Use MiraLax (over the counter) for constipation as needed.     Diet - low sodium heart healthy    Complete by:  As directed      Diet general    Complete by:  As directed      Driving restrictions    Complete by:  As directed   No driving while taking narcotic pain meds.     Increase activity slowly as tolerated    Complete by:  As directed            Follow-up Information    Follow up with Cheral AlmasXu, Naiping Michael, MD In 2 weeks.   Specialty:  Orthopedic Surgery   Why:  For suture removal, For wound re-check   Contact information:   [address] [place] [id number] [phone]       Follow up with [facility].   Why:  Someone from [facility] will contact you concerning start date and timefor therapy.   Contact information:   [address] [place] [address] [phone]        Signed: Cheral AlmasXu, Naiping Michael 06/19/2015, 7:36 AM

## 2015-06-19 NOTE — Care Management (Signed)
Case manager discussed discharge plans with patient's husband- Mr. Janace HoardLeingang. Informed him that everything is in place for home health and that a social worker will also check on them. They have a new landline # [phone]. Case Manager will contact Miranda, [facility] to provide them with number. Vance PeperSusan Rhythm Wigfall, RN BSN Case Manager

## 2015-06-19 NOTE — Progress Notes (Signed)
Physical Therapy Treatment Patient Details Name: [name] MRN: [id number] DOB: 09-May-1934 Today's Date: 06/19/2015    History of Present Illness 81 y.o. female who presents for surgical treatment of LEFT DISTAL RADIUS AND ULNA FRACTURES. Pt with PMHx: of COPD, aFib, HTN    PT Comments    Pt with improved transfers from yesterday but continues to need constant direction for mobility and safety. Pt with decreased safety awareness, problem solving and cognition. Continue to recommend 24hr assist. Pt with sats 88-94 on 3L with activity and was able to maintain sats >90% throughout on 4L with gait. Pt educated for oxygen need. Pt denied exercise as she was fatigued. Pt returned to bed with alarm on secondary to no chair alarms on unit.   Follow Up Recommendations  Home health PT;Supervision/Assistance - 24 hour     Equipment Recommendations       Recommendations for Other Services       Precautions / Restrictions Precautions Precautions: Fall Restrictions LUE Weight Bearing: Non weight bearing    Mobility  Bed Mobility Overal bed mobility: Needs Assistance Bed Mobility: Rolling;Sidelying to Sit Rolling: Min guard Sidelying to sit: Min assist       General bed mobility comments: cues for sequence for rolling and side to sit with increased time to raise trunk from surface and pt unable to sequence initiation of mobility without assist  Transfers Overall transfer level: Needs assistance     Sit to Stand: Min assist         General transfer comment: cues for hand placement and safety from bed, recliner and toilet  Ambulation/Gait Ambulation/Gait assistance: Min guard Ambulation Distance (Feet): 40 Feet Assistive device: Hemi-walker Gait Pattern/deviations: Step-through pattern;Decreased stride length;Trunk flexed;Narrow base of support   Gait velocity interpretation: Below normal speed for age/gender General Gait Details: constant cues for safety, position  of hemiwalker and sequence with gait. pt walked 15' to and from bathroom and an additional 8' in hallway with O2 and chair to follow. Pt very anxious to sit and report weakness and fatigue limiting her function   Stairs            Wheelchair Mobility    Modified Rankin (Stroke Patients Only)       Balance Overall balance assessment: Needs assistance   Sitting balance-Leahy Scale: Good       Standing balance-Leahy Scale: Fair                      Cognition Arousal/Alertness: Awake/alert Behavior During Therapy: Flat affect Overall Cognitive Status: Impaired/Different from baseline Area of Impairment: Orientation;Memory;Safety/judgement;Problem solving     Memory: Decreased short-term memory   Safety/Judgement: Decreased awareness of safety;Decreased awareness of deficits   Problem Solving: Slow processing;Difficulty sequencing      Exercises      General Comments        Pertinent Vitals/Pain Pain Score: 4  Pain Location: LUE Pain Descriptors / Indicators: Aching Pain Intervention(s): Repositioned    Home Living                      Prior Function            PT Goals (current goals can now be found in the care plan section) Progress towards PT goals: Progressing toward goals    Frequency       PT Plan Current plan remains appropriate    Co-evaluation  End of Session Equipment Utilized During Treatment: Gait belt;Oxygen Activity Tolerance: Patient tolerated treatment well Patient left: in bed;with call bell/phone within reach;with bed alarm set     Time: 0802-0829 PT Time Calculation (min) (ACUTE ONLY): 27 min  Charges:  $Gait Training: 23-37 mins                    G Codes:      Delorse Lekabor, Zelig Gacek Beth 06/19/2015, 9:54 AM Delaney MeigsMaija Tabor Lauriel Helin, PT [phone]

## 2015-06-19 NOTE — Progress Notes (Signed)
Patient is stable and ready for dc today.

## 2015-06-19 NOTE — Progress Notes (Signed)
Pt discharge education and instructions completed with pt and spouse at bedside; both voices understanding and denies any questions. Pt IV removed; pt handed her prescription for hydrocodone; pt discharge home with spouse to transport her home. Pt dsg clean dry and intact. Sling reapplied by ortho tech. Pt transported off unit via wheelchair with spouse and belongings at side. Arabella MerlesP. Amo Sarah-Jane Nazario RN.

## 2015-06-19 NOTE — Progress Notes (Signed)
Orthopedic Tech Progress Note Patient Details:  [name] Jul 13, 1934 [id number]  Ortho Devices Type of Ortho Device: Arm sling Ortho Device/Splint Location: lue Ortho Device/Splint Interventions: Application   Murel Wigle 06/19/2015, 2:57 PM

## 2015-06-22 NOTE — Progress Notes (Signed)
Addendum to Physical therapy note 11/3   06/18/15 1132  PT G-Codes **NOT FOR INPATIENT CLASS**  Functional Assessment Tool Used clinical judgement  Functional Limitation Mobility: Walking and moving around  Mobility: Walking and Moving Around Current Status [phone]([id number]) CJ  Mobility: Walking and Moving Around Goal Status [phone](G8979) CI  Delaney MeigsMaija Tabor Fedrick Cefalu, PT [phone]

## 2015-06-23 ENCOUNTER — Encounter (HOSPITAL_COMMUNITY): Payer: Self-pay | Admitting: Orthopaedic Surgery

## 2015-07-17 ENCOUNTER — Emergency Department: Payer: Medicare Other

## 2015-07-17 ENCOUNTER — Inpatient Hospital Stay
Admission: EM | Admit: 2015-07-17 | Discharge: 2015-07-20 | DRG: 871 | Disposition: A | Discharge status: Home Health | Payer: Medicare Other | Attending: Internal Medicine | Admitting: Internal Medicine

## 2015-07-17 ENCOUNTER — Encounter: Payer: Self-pay | Admitting: Emergency Medicine

## 2015-07-17 ENCOUNTER — Inpatient Hospital Stay: Payer: Medicare Other

## 2015-07-17 DIAGNOSIS — J189 Pneumonia, unspecified organism: Secondary | ICD-10-CM | POA: Diagnosis not present

## 2015-07-17 DIAGNOSIS — G9341 Metabolic encephalopathy: Secondary | ICD-10-CM | POA: Diagnosis present

## 2015-07-17 DIAGNOSIS — I129 Hypertensive chronic kidney disease with stage 1 through stage 4 chronic kidney disease, or unspecified chronic kidney disease: Secondary | ICD-10-CM | POA: Diagnosis not present

## 2015-07-17 DIAGNOSIS — Z888 Allergy status to other drugs, medicaments and biological substances status: Secondary | ICD-10-CM | POA: Diagnosis not present

## 2015-07-17 DIAGNOSIS — E1121 Type 2 diabetes mellitus with diabetic nephropathy: Secondary | ICD-10-CM | POA: Diagnosis not present

## 2015-07-17 DIAGNOSIS — N2581 Secondary hyperparathyroidism of renal origin: Secondary | ICD-10-CM | POA: Diagnosis present

## 2015-07-17 DIAGNOSIS — I959 Hypotension, unspecified: Secondary | ICD-10-CM | POA: Diagnosis not present

## 2015-07-17 DIAGNOSIS — E785 Hyperlipidemia, unspecified: Secondary | ICD-10-CM | POA: Diagnosis not present

## 2015-07-17 DIAGNOSIS — S62102D Fracture of unspecified carpal bone, left wrist, subsequent encounter for fracture with routine healing: Secondary | ICD-10-CM | POA: Diagnosis not present

## 2015-07-17 DIAGNOSIS — N183 Chronic kidney disease, stage 3 (moderate): Secondary | ICD-10-CM | POA: Diagnosis present

## 2015-07-17 DIAGNOSIS — Z794 Long term (current) use of insulin: Secondary | ICD-10-CM

## 2015-07-17 DIAGNOSIS — A419 Sepsis, unspecified organism: Principal | ICD-10-CM | POA: Diagnosis present

## 2015-07-17 DIAGNOSIS — M4806 Spinal stenosis, lumbar region: Secondary | ICD-10-CM | POA: Diagnosis present

## 2015-07-17 DIAGNOSIS — J9601 Acute respiratory failure with hypoxia: Secondary | ICD-10-CM | POA: Diagnosis present

## 2015-07-17 DIAGNOSIS — S62102S Fracture of unspecified carpal bone, left wrist, sequela: Secondary | ICD-10-CM

## 2015-07-17 DIAGNOSIS — Z87891 Personal history of nicotine dependence: Secondary | ICD-10-CM | POA: Diagnosis not present

## 2015-07-17 DIAGNOSIS — Z79899 Other long term (current) drug therapy: Secondary | ICD-10-CM

## 2015-07-17 DIAGNOSIS — R042 Hemoptysis: Secondary | ICD-10-CM | POA: Diagnosis not present

## 2015-07-17 DIAGNOSIS — X58XXXD Exposure to other specified factors, subsequent encounter: Secondary | ICD-10-CM | POA: Diagnosis not present

## 2015-07-17 DIAGNOSIS — Y95 Nosocomial condition: Secondary | ICD-10-CM | POA: Diagnosis present

## 2015-07-17 DIAGNOSIS — S52602D Unspecified fracture of lower end of left ulna, subsequent encounter for closed fracture with routine healing: Secondary | ICD-10-CM

## 2015-07-17 DIAGNOSIS — Z96641 Presence of right artificial hip joint: Secondary | ICD-10-CM | POA: Diagnosis not present

## 2015-07-17 DIAGNOSIS — Z96651 Presence of right artificial knee joint: Secondary | ICD-10-CM | POA: Diagnosis present

## 2015-07-17 DIAGNOSIS — M7989 Other specified soft tissue disorders: Secondary | ICD-10-CM | POA: Diagnosis not present

## 2015-07-17 DIAGNOSIS — R4182 Altered mental status, unspecified: Secondary | ICD-10-CM

## 2015-07-17 DIAGNOSIS — I4891 Unspecified atrial fibrillation: Secondary | ICD-10-CM

## 2015-07-17 DIAGNOSIS — S52502D Unspecified fracture of the lower end of left radius, subsequent encounter for closed fracture with routine healing: Secondary | ICD-10-CM | POA: Diagnosis not present

## 2015-07-17 DIAGNOSIS — Z6841 Body Mass Index (BMI) 40.0 and over, adult: Secondary | ICD-10-CM

## 2015-07-17 DIAGNOSIS — R0600 Dyspnea, unspecified: Secondary | ICD-10-CM

## 2015-07-17 DIAGNOSIS — Z885 Allergy status to narcotic agent status: Secondary | ICD-10-CM

## 2015-07-17 DIAGNOSIS — E669 Obesity, unspecified: Secondary | ICD-10-CM | POA: Diagnosis not present

## 2015-07-17 DIAGNOSIS — J449 Chronic obstructive pulmonary disease, unspecified: Secondary | ICD-10-CM | POA: Diagnosis present

## 2015-07-17 DIAGNOSIS — D631 Anemia in chronic kidney disease: Secondary | ICD-10-CM | POA: Diagnosis present

## 2015-07-17 DIAGNOSIS — Z886 Allergy status to analgesic agent status: Secondary | ICD-10-CM

## 2015-07-17 DIAGNOSIS — N289 Disorder of kidney and ureter, unspecified: Secondary | ICD-10-CM

## 2015-07-17 DIAGNOSIS — J309 Allergic rhinitis, unspecified: Secondary | ICD-10-CM | POA: Diagnosis not present

## 2015-07-17 DIAGNOSIS — I482 Chronic atrial fibrillation: Secondary | ICD-10-CM | POA: Diagnosis not present

## 2015-07-17 DIAGNOSIS — M1990 Unspecified osteoarthritis, unspecified site: Secondary | ICD-10-CM | POA: Diagnosis present

## 2015-07-17 DIAGNOSIS — J181 Lobar pneumonia, unspecified organism: Secondary | ICD-10-CM

## 2015-07-17 DIAGNOSIS — Z79891 Long term (current) use of opiate analgesic: Secondary | ICD-10-CM

## 2015-07-17 DIAGNOSIS — R609 Edema, unspecified: Secondary | ICD-10-CM

## 2015-07-17 LAB — URINALYSIS COMPLETE WITH MICROSCOPIC (ARMC ONLY)
Bilirubin Urine: NEGATIVE
Glucose, UA: NEGATIVE mg/dL
Hgb urine dipstick: NEGATIVE
KETONES UR: NEGATIVE mg/dL
Leukocytes, UA: NEGATIVE
NITRITE: POSITIVE — AB
PH: 5 (ref 5.0–8.0)
Specific Gravity, Urine: 1.024 (ref 1.005–1.030)

## 2015-07-17 LAB — PROTIME-INR
INR: 2.24
PROTHROMBIN TIME: 24.6 s — AB (ref 11.4–15.0)

## 2015-07-17 LAB — CBC WITH DIFFERENTIAL/PLATELET
Basophils Absolute: 0.1 10*3/uL (ref 0–0.1)
Basophils Relative: 1 %
Eosinophils Absolute: 0 10*3/uL (ref 0–0.7)
Eosinophils Relative: 0 %
HEMATOCRIT: 42.7 % (ref 35.0–47.0)
HEMOGLOBIN: 13.8 g/dL (ref 12.0–16.0)
LYMPHS ABS: 1.3 10*3/uL (ref 1.0–3.6)
LYMPHS PCT: 7 %
MCH: 31.9 pg (ref 26.0–34.0)
MCHC: 32.2 g/dL (ref 32.0–36.0)
MCV: 99.1 fL (ref 80.0–100.0)
MONOS PCT: 4 %
Monocytes Absolute: 0.8 10*3/uL (ref 0.2–0.9)
NEUTROS ABS: 16.2 10*3/uL — AB (ref 1.4–6.5)
NEUTROS PCT: 88 %
Platelets: 250 10*3/uL (ref 150–440)
RBC: 4.31 MIL/uL (ref 3.80–5.20)
RDW: 15.7 % — ABNORMAL HIGH (ref 11.5–14.5)
WBC: 18.4 10*3/uL — ABNORMAL HIGH (ref 3.6–11.0)

## 2015-07-17 LAB — ABO/RH: ABO/RH(D): A POS

## 2015-07-17 LAB — COMPREHENSIVE METABOLIC PANEL
ALT: 15 U/L (ref 14–54)
AST: 15 U/L (ref 15–41)
Albumin: 3.1 g/dL — ABNORMAL LOW (ref 3.5–5.0)
Alkaline Phosphatase: 101 U/L (ref 38–126)
Anion gap: 7 (ref 5–15)
BUN: 22 mg/dL — AB (ref 6–20)
CHLORIDE: 104 mmol/L (ref 101–111)
CO2: 26 mmol/L (ref 22–32)
CREATININE: 1.04 mg/dL — AB (ref 0.44–1.00)
Calcium: 8.2 mg/dL — ABNORMAL LOW (ref 8.9–10.3)
GFR calc Af Amer: 58 mL/min — ABNORMAL LOW (ref >60)
GFR calc non Af Amer: 50 mL/min — ABNORMAL LOW (ref >60)
Glucose, Bld: 144 mg/dL — ABNORMAL HIGH (ref 65–99)
Potassium: 3.9 mmol/L (ref 3.5–5.1)
SODIUM: 137 mmol/L (ref 135–145)
Total Bilirubin: 0.5 mg/dL (ref 0.3–1.2)
Total Protein: 6 g/dL — ABNORMAL LOW (ref 6.5–8.1)

## 2015-07-17 LAB — MRSA PCR SCREENING: MRSA BY PCR: NEGATIVE

## 2015-07-17 LAB — TSH: TSH: 2.703 u[IU]/mL (ref 0.350–4.500)

## 2015-07-17 LAB — GLUCOSE, CAPILLARY: Glucose-Capillary: 150 mg/dL — ABNORMAL HIGH (ref 65–99)

## 2015-07-17 LAB — PROCALCITONIN: PROCALCITONIN: 0.14 ng/mL

## 2015-07-17 LAB — LACTIC ACID, PLASMA
Lactic Acid, Venous: 1.8 mmol/L (ref 0.5–2.0)
Lactic Acid, Venous: 1.9 mmol/L (ref 0.5–2.0)

## 2015-07-17 LAB — MAGNESIUM: MAGNESIUM: 1.7 mg/dL (ref 1.7–2.4)

## 2015-07-17 LAB — TYPE AND SCREEN
ABO/RH(D): A POS
Antibody Screen: NEGATIVE

## 2015-07-17 LAB — TROPONIN I: TROPONIN I: 0.03 ng/mL (ref <0.031)

## 2015-07-17 MED ORDER — IPRATROPIUM-ALBUTEROL 0.5-2.5 (3) MG/3ML IN SOLN
RESPIRATORY_TRACT | Status: AC
  Administered 2015-07-17: 3 mL via RESPIRATORY_TRACT
  Filled 2015-07-17: qty 3

## 2015-07-17 MED ORDER — CETYLPYRIDINIUM CHLORIDE 0.05 % MT LIQD
7.0000 mL | Freq: Two times a day (BID) | OROMUCOSAL | Status: DC
  Administered 2015-07-17 – 2015-07-19 (×5): 7 mL via OROMUCOSAL

## 2015-07-17 MED ORDER — LEVALBUTEROL HCL 1.25 MG/0.5ML IN NEBU
1.2500 mg | INHALATION_SOLUTION | Freq: Four times a day (QID) | RESPIRATORY_TRACT | Status: DC
  Administered 2015-07-17 – 2015-07-18 (×3): 1.25 mg via RESPIRATORY_TRACT
  Filled 2015-07-17 (×5): qty 0.5

## 2015-07-17 MED ORDER — INSULIN GLARGINE 100 UNIT/ML ~~LOC~~ SOLN
30.0000 [IU] | Freq: Every morning | SUBCUTANEOUS | Status: DC
  Administered 2015-07-18 – 2015-07-20 (×3): 30 [IU] via SUBCUTANEOUS
  Filled 2015-07-17 (×3): qty 0.3

## 2015-07-17 MED ORDER — SODIUM CHLORIDE 0.9 % IV BOLUS (SEPSIS)
1000.0000 mL | Freq: Once | INTRAVENOUS | Status: AC
  Administered 2015-07-17: 1000 mL via INTRAVENOUS

## 2015-07-17 MED ORDER — SIMVASTATIN 10 MG PO TABS
10.0000 mg | ORAL_TABLET | Freq: Every day | ORAL | Status: DC
  Administered 2015-07-17 – 2015-07-20 (×4): 10 mg via ORAL
  Filled 2015-07-17 (×4): qty 1

## 2015-07-17 MED ORDER — OXYMETAZOLINE HCL 0.05 % NA SOLN: 1.0000 | Freq: Two times a day (BID) | NASAL | Status: DC

## 2015-07-17 MED ORDER — AMIODARONE HCL IN DEXTROSE 360-4.14 MG/200ML-% IV SOLN
60.0000 mg/h | INTRAVENOUS | Status: AC
  Administered 2015-07-17: 60 mg/h via INTRAVENOUS
  Filled 2015-07-17 (×2): qty 200

## 2015-07-17 MED ORDER — LEVOFLOXACIN IN D5W 750 MG/150ML IV SOLN: 750.0000 mg | INTRAVENOUS | Status: DC

## 2015-07-17 MED ORDER — PIPERACILLIN-TAZOBACTAM 3.375 G IVPB
3.3750 g | Freq: Three times a day (TID) | INTRAVENOUS | Status: DC
  Administered 2015-07-17 – 2015-07-18 (×3): 3.375 g via INTRAVENOUS
  Filled 2015-07-17 (×4): qty 50

## 2015-07-17 MED ORDER — AMIODARONE HCL IN DEXTROSE 360-4.14 MG/200ML-% IV SOLN
30.0000 mg/h | INTRAVENOUS | Status: DC
  Administered 2015-07-17 – 2015-07-18 (×2): 30 mg/h via INTRAVENOUS
  Filled 2015-07-17 (×8): qty 200

## 2015-07-17 MED ORDER — DONEPEZIL HCL 5 MG PO TABS
5.0000 mg | ORAL_TABLET | Freq: Every day | ORAL | Status: DC
  Administered 2015-07-17 – 2015-07-19 (×3): 5 mg via ORAL
  Filled 2015-07-17 (×3): qty 1

## 2015-07-17 MED ORDER — ONDANSETRON HCL 4 MG/2ML IJ SOLN: 4.0000 mg | Freq: Four times a day (QID) | INTRAMUSCULAR | Status: DC | PRN

## 2015-07-17 MED ORDER — LEVALBUTEROL HCL 1.25 MG/0.5ML IN NEBU: 1.2500 mg | INHALATION_SOLUTION | Freq: Four times a day (QID) | RESPIRATORY_TRACT | Status: DC

## 2015-07-17 MED ORDER — LORAZEPAM 2 MG/ML IJ SOLN
INTRAMUSCULAR | Status: AC
  Administered 2015-07-17: 1 mg via INTRAVENOUS
  Filled 2015-07-17: qty 1

## 2015-07-17 MED ORDER — AMIODARONE LOAD VIA INFUSION
150.0000 mg | Freq: Once | INTRAVENOUS | Status: AC
  Administered 2015-07-17: 150 mg via INTRAVENOUS
  Filled 2015-07-17: qty 83.34

## 2015-07-17 MED ORDER — DILTIAZEM HCL 25 MG/5ML IV SOLN
10.0000 mg | Freq: Once | INTRAVENOUS | Status: AC
  Administered 2015-07-17: 10 mg via INTRAVENOUS

## 2015-07-17 MED ORDER — FUROSEMIDE 40 MG PO TABS
40.0000 mg | ORAL_TABLET | Freq: Every day | ORAL | Status: DC
  Administered 2015-07-17 – 2015-07-20 (×4): 40 mg via ORAL
  Filled 2015-07-17 (×4): qty 1

## 2015-07-17 MED ORDER — DIGOXIN 125 MCG PO TABS
0.1250 mg | ORAL_TABLET | Freq: Every day | ORAL | Status: DC
  Administered 2015-07-17 – 2015-07-20 (×4): 0.125 mg via ORAL
  Filled 2015-07-17 (×4): qty 1

## 2015-07-17 MED ORDER — ACETAMINOPHEN 325 MG PO TABS: 650.0000 mg | ORAL_TABLET | Freq: Four times a day (QID) | ORAL | Status: DC | PRN

## 2015-07-17 MED ORDER — SODIUM CHLORIDE 0.9 % IV SOLN
1000.0000 mL | Freq: Once | INTRAVENOUS | Status: AC
  Administered 2015-07-17: 1000 mL via INTRAVENOUS

## 2015-07-17 MED ORDER — FLUTICASONE PROPIONATE 50 MCG/ACT NA SUSP
1.0000 | Freq: Every day | NASAL | Status: DC
  Administered 2015-07-18 – 2015-07-20 (×3): 1 via NASAL
  Filled 2015-07-17 (×2): qty 16

## 2015-07-17 MED ORDER — NYSTATIN 100000 UNIT/GM EX POWD
1.0000 g | Freq: Two times a day (BID) | CUTANEOUS | Status: DC
Start: 2015-07-17 — End: 2015-07-20
  Administered 2015-07-17 – 2015-07-20 (×6): 1 g via TOPICAL
  Filled 2015-07-17: qty 15

## 2015-07-17 MED ORDER — SODIUM CHLORIDE 0.9 % IJ SOLN
3.0000 mL | Freq: Two times a day (BID) | INTRAMUSCULAR | Status: DC
  Administered 2015-07-17 – 2015-07-20 (×6): 3 mL via INTRAVENOUS

## 2015-07-17 MED ORDER — LORAZEPAM 2 MG/ML IJ SOLN
1.0000 mg | Freq: Once | INTRAMUSCULAR | Status: AC
  Administered 2015-07-17: 1 mg via INTRAVENOUS

## 2015-07-17 MED ORDER — POTASSIUM CHLORIDE IN NACL 20-0.9 MEQ/L-% IV SOLN
INTRAVENOUS | Status: DC
  Administered 2015-07-17 – 2015-07-18 (×2): via INTRAVENOUS
  Filled 2015-07-17 (×6): qty 1000

## 2015-07-17 MED ORDER — LEVALBUTEROL HCL 1.25 MG/0.5ML IN NEBU: 1.2500 mg | INHALATION_SOLUTION | RESPIRATORY_TRACT | Status: DC | PRN

## 2015-07-17 MED ORDER — DILTIAZEM HCL 100 MG IV SOLR
5.0000 mg/h | Freq: Once | INTRAVENOUS | Status: AC
  Administered 2015-07-17 (×2): 5 mg/h via INTRAVENOUS
  Filled 2015-07-17: qty 100

## 2015-07-17 MED ORDER — IPRATROPIUM-ALBUTEROL 0.5-2.5 (3) MG/3ML IN SOLN
3.0000 mL | Freq: Once | RESPIRATORY_TRACT | Status: AC
  Administered 2015-07-17: 3 mL via RESPIRATORY_TRACT

## 2015-07-17 MED ORDER — ONDANSETRON HCL 4 MG PO TABS: 4.0000 mg | ORAL_TABLET | Freq: Four times a day (QID) | ORAL | Status: DC | PRN

## 2015-07-17 MED ORDER — ACETAMINOPHEN 650 MG RE SUPP: 650.0000 mg | Freq: Four times a day (QID) | RECTAL | Status: DC | PRN

## 2015-07-17 MED ORDER — METOPROLOL TARTRATE 1 MG/ML IV SOLN
5.0000 mg | Freq: Once | INTRAVENOUS | Status: AC
  Administered 2015-07-17: 5 mg via INTRAVENOUS
  Filled 2015-07-17: qty 5

## 2015-07-17 MED ORDER — PANTOPRAZOLE SODIUM 40 MG IV SOLR
40.0000 mg | Freq: Once | INTRAVENOUS | Status: AC
  Administered 2015-07-17: 40 mg via INTRAVENOUS
  Filled 2015-07-17: qty 40

## 2015-07-17 MED ORDER — IOHEXOL 350 MG/ML SOLN
80.0000 mL | Freq: Once | INTRAVENOUS | Status: AC | PRN
  Administered 2015-07-17: 80 mL via INTRAVENOUS

## 2015-07-17 MED ORDER — LEVOFLOXACIN IN D5W 750 MG/150ML IV SOLN
750.0000 mg | Freq: Once | INTRAVENOUS | Status: AC
  Administered 2015-07-17: 750 mg via INTRAVENOUS
  Filled 2015-07-17: qty 150

## 2015-07-17 MED ORDER — LEVOFLOXACIN IN D5W 750 MG/150ML IV SOLN
750.0000 mg | INTRAVENOUS | Status: DC
  Administered 2015-07-18: 750 mg via INTRAVENOUS
  Filled 2015-07-17 (×2): qty 150

## 2015-07-17 MED ORDER — ONDANSETRON HCL 4 MG/2ML IJ SOLN
4.0000 mg | Freq: Once | INTRAMUSCULAR | Status: AC
  Administered 2015-07-17: 4 mg via INTRAVENOUS
  Filled 2015-07-17: qty 2

## 2015-07-17 MED ORDER — ALBUTEROL SULFATE (2.5 MG/3ML) 0.083% IN NEBU: 2.5000 mg | INHALATION_SOLUTION | Freq: Four times a day (QID) | RESPIRATORY_TRACT | Status: DC | PRN

## 2015-07-17 MED ORDER — DILTIAZEM HCL 25 MG/5ML IV SOLN
INTRAVENOUS | Status: AC
  Administered 2015-07-17: 10 mg via INTRAVENOUS
  Filled 2015-07-17: qty 5

## 2015-07-17 MED ORDER — OXYMETAZOLINE HCL 0.05 % NA SOLN
1.0000 | Freq: Two times a day (BID) | NASAL | Status: DC | PRN
  Administered 2015-07-18: 1 via NASAL
  Filled 2015-07-17 (×2): qty 15

## 2015-07-17 NOTE — Consult Note (Signed)
PULMONARY / CRITICAL CARE MEDICINE   Name: Allison Shepard MRN: [id number] DOB: 1934-05-16    ADMISSION DATE:  07/17/2015 CONSULTATION DATE:  07/17/15  REFERRING MD :  Dr. Winona Legato   CHIEF COMPLAINT:     Altered mental status and hemoptysis   HISTORY OF PRESENT ILLNESS    81 y.o. female with a known history of multiple medical problems including atrial fibrillation, chronic hypertension, hyperlipidemia, obesity diabetes, recent admission for pneumonia in Aug 2016 x 2, presents back to the hospital with complaints of worsening confusion, mild hemoptysis, sob, and suspected LUL PNA.  She was coughing up phlegm with some blood. Patient's daughter was called and patient was brought to emergency room for further evaluation. In emergency room, she was noted to be in A. fib, RVR, heart rate about 150 intermittently. She was given a few doses of Cardizem as well as metoprolol with no significant improvement in her A. fib, RVR, but just dropping her blood pressure. Patient's chest x-ray revealed left upper lobe pneumonia. Labs showed mild renal insufficiency as well as significant leukocytosis.    SIGNIFICANT EVENTS   03/16/15 - hospitalized for AECOPD, ?CHF, Afib 04/06/15 - hospitalized for Sepsis (GBS bacteremia), afib with RVR   PAST MEDICAL HISTORY    :  Past Medical History  Diagnosis Date  . COPD (chronic obstructive pulmonary disease) (HCC)   . Atrial fibrillation (HCC)   . Hypertension   . Hypercholesteremia   . Pneumonia   . Diabetes mellitus without complication (HCC)     type 2  . Headache     migraines - years ago  . Complication of anesthesia     difficulty to wake up after an 8 hour back surgery   Past Surgical History  Procedure Laterality Date  . Abdominal hysterectomy    . Shoulder fusion surgery Right   . Knee surgery Right     knee replacement  . Hip surgery Right     hip replacement  . Back surgery    . Eye surgery      cataract surgery ? eye  .  Fracture surgery      right arm 20 years ago  . Colonoscopy      x 3  . Orif distal radius fracture Left 06/17/2015  . Open reduction internal fixation (orif) distal radial fracture Left 06/17/2015    Procedure: OPEN REDUCTION INTERNAL FIXATION LEFT  DISTAL RADIUS AND ULNA FRACTURES;  Surgeon: Tarry Kos, MD;  Location: [facility] OR;  Service: Orthopedics;  Laterality: Left;   Prior to Admission medications   Medication Sig Start Date End Date Taking? Authorizing Provider  albuterol (PROVENTIL HFA;VENTOLIN HFA) 108 (90 BASE) MCG/ACT inhaler Inhale 2 puffs into the lungs every 6 (six) hours as needed for wheezing or shortness of breath.    Historical Provider, MD  albuterol (PROVENTIL) (2.5 MG/3ML) 0.083% nebulizer solution Take 2.5 mg by nebulization every 6 (six) hours as needed for wheezing or shortness of breath.    Historical Provider, MD  Cholecalciferol (VITAMIN D-3) 1000 UNITS CAPS Take 1,000 Units by mouth daily.    Historical Provider, MD  digoxin (LANOXIN) 0.125 MG tablet Take 1 tablet (0.125 mg total) by mouth daily. 04/14/15   Adrian Saran, MD  diltiazem (TIAZAC) 180 MG 24 hr capsule Take 1 capsule (180 mg total) by mouth daily. 04/14/15   Sital Mody, MD  donepezil (ARICEPT) 5 MG tablet Take 5 mg by mouth at bedtime.    Historical Provider, MD  fluticasone (  FLONASE) 50 MCG/ACT nasal spray Place 1 spray into both nostrils daily. 03/20/15   Auburn Bilberry, MD  furosemide (LASIX) 40 MG tablet Take 40 mg by mouth daily.    Historical Provider, MD  HYDROcodone-acetaminophen (NORCO) 10-325 MG tablet Take 1-2 tablets by mouth every 6 (six) hours as needed. 06/17/15   Naiping Donnelly Stager, MD  insulin aspart (NOVOLOG) 100 UNIT/ML injection Inject 8-12 Units into the skin 3 (three) times daily with meals. Below 200=8 units and every 50 units above she gets one extra unit up to 12 units    Historical Provider, MD  insulin glargine (LANTUS) 100 UNIT/ML injection Inject 30 Units into the skin every morning.      Historical Provider, MD  lisinopril (PRINIVIL,ZESTRIL) 5 MG tablet Take 5 mg by mouth daily.    Historical Provider, MD  meloxicam (MOBIC) 7.5 MG tablet Take 1 tablet (7.5 mg total) by mouth 2 (two) times daily after a meal. Patient not taking: Reported on 06/09/2015 03/11/15   Tod Persia, MD  metoprolol succinate (TOPROL-XL) 100 MG 24 hr tablet Take 100 mg by mouth daily. Take with or immediately following a meal.    Historical Provider, MD  nystatin (MYCOSTATIN) powder Apply 1 g topically 2 (two) times daily as needed.    Historical Provider, MD  oxymetazoline (AFRIN) 0.05 % nasal spray Place 1 spray into both nostrils 2 (two) times daily.    Historical Provider, MD  oxymetazoline (VICKS SINEX) 0.05 % nasal spray Place 1 spray into both nostrils 2 (two) times daily.    Historical Provider, MD  rivaroxaban (XARELTO) 20 MG TABS tablet Take 20 mg by mouth daily.    Historical Provider, MD  simvastatin (ZOCOR) 10 MG tablet Take 10 mg by mouth daily.    Historical Provider, MD  zolpidem (AMBIEN) 5 MG tablet Take 5 mg by mouth at bedtime as needed for sleep.    Historical Provider, MD   Allergies  Allergen Reactions  . Hydromorphone Hcl Nausea And Vomiting  . Ibuprofen Other (See Comments)    Pt was told by her MD not to take this medication.    . Mucinex [Guaifenesin Er] Other (See Comments)    Red all over. Felt like i was on fire  . Percocet [Oxycodone-Acetaminophen] Hives     FAMILY HISTORY   Family History  Problem Relation Age of Onset  . Heart disease Father       SOCIAL HISTORY    reports that she quit smoking about 23 years ago. She has never used smokeless tobacco. She reports that she drinks about 8.4 oz of alcohol per week. She reports that she does not use illicit drugs.  Review of Systems  Constitutional: Positive for fever and chills.  Eyes: Negative for blurred vision, double vision and photophobia.  Respiratory: Positive for cough, hemoptysis, sputum production,  shortness of breath and wheezing.   Cardiovascular: Positive for chest pain.  Gastrointestinal: Negative for nausea, vomiting, abdominal pain and diarrhea.  Genitourinary: Negative for dysuria and urgency.  Musculoskeletal: Negative for myalgias.  Skin: Positive for rash.  Neurological: Negative for dizziness and loss of consciousness.  Endo/Heme/Allergies: Does not bruise/bleed easily.  Psychiatric/Behavioral: Positive for hallucinations.      VITAL SIGNS    Temp:  [99.9 F (37.7 C)] 99.9 F (37.7 C) (12/02 1154) Pulse Rate:  [66-160] 160 (12/02 1500) Resp:  [20-34] 20 (12/02 1603) BP: (66-134)/(38-103) 119/84 mmHg (12/02 1603) SpO2:  [88 %-100 %] 95 % (12/02 1500) HEMODYNAMICS:  VENTILATOR SETTINGS:   INTAKE / OUTPUT: No intake or output data in the 24 hours ending 07/17/15 1620     PHYSICAL EXAM   Physical Exam  Constitutional: She appears well-developed and well-nourished.  HENT:  Head: Normocephalic and atraumatic.  Right Ear: External ear normal.  Left Ear: External ear normal.  Mouth/Throat: Oropharynx is clear and moist.  Eyes: Pupils are equal, round, and reactive to light.  Neck: Normal range of motion. Neck supple. No thyromegaly present.  Cardiovascular: Intact distal pulses.  Exam reveals no gallop and no friction rub.   No murmur heard. Tachycardia with irregular beat  Pulmonary/Chest: She has wheezes. She has rales.  Shallow BS. Dec BS on the LUL Mild faint wheezes  Abdominal: Soft. Bowel sounds are normal. She exhibits no distension. There is no tenderness. There is no rebound.  Musculoskeletal: Normal range of motion. She exhibits tenderness.  Neurological: She is alert.  Awake but confused. Orient to place only.   Skin: Skin is warm and dry.  +2 LE B/L Edema Venous stasis in B/L   Nursing note and vitals reviewed.      LABS   LABS:  CBC  Recent Labs Lab 07/17/15 1158  WBC 18.4*  HGB 13.8  HCT 42.7  PLT 250    Coag's  Recent Labs Lab 07/17/15 1158  INR 2.24   BMET  Recent Labs Lab 07/17/15 1250  NA 137  K 3.9  CL 104  CO2 26  BUN 22*  CREATININE 1.04*  GLUCOSE 144*   Electrolytes  Recent Labs Lab 07/17/15 1250  CALCIUM 8.2*   Sepsis Markers  Recent Labs Lab 07/17/15 1250  LATICACIDVEN 1.8   ABG No results for input(s): PHART, PCO2ART, PO2ART in the last 168 hours. Liver Enzymes  Recent Labs Lab 07/17/15 1250  AST 15  ALT 15  ALKPHOS 101  BILITOT 0.5  ALBUMIN 3.1*   Cardiac Enzymes  Recent Labs Lab 07/17/15 1250  TROPONINI 0.03   Glucose No results for input(s): GLUCAP in the last 168 hours.   No results found for this or any previous visit (from the past 240 hour(s)).   Current facility-administered medications:  .  0.9 % NaCl with KCl 20 mEq/ L  infusion, , Intravenous, Continuous, Katharina Caperima Vaickute, MD, Last Rate: 100 mL/hr at 07/17/15 1610 .  [COMPLETED] amiodarone (NEXTERONE) 1.8 mg/mL load via infusion 150 mg, 150 mg, Intravenous, Once, 150 mg at 07/17/15 1604 **FOLLOWED BY** amiodarone (NEXTERONE PREMIX) 360 MG/200ML (1.8 mg/mL) IV infusion, 60 mg/hr, Intravenous, Continuous, Last Rate: 33.3 mL/hr at 07/17/15 1604, 60 mg/hr at 07/17/15 1604 **FOLLOWED BY** amiodarone (NEXTERONE PREMIX) 360 MG/200ML (1.8 mg/mL) IV infusion, 30 mg/hr, Intravenous, Continuous, Katharina Caperima Vaickute, MD .  levalbuterol (XOPENEX) nebulizer solution 1.25 mg, 1.25 mg, Nebulization, 4 times per day, Harshita Bernales, MD .  levalbuterol (XOPENEX) nebulizer solution 1.25 mg, 1.25 mg, Nebulization, Q4H PRN, Jemal Miskell, MD .  levofloxacin (LEVAQUIN) IVPB 750 mg, 750 mg, Intravenous, Q24H, Oyindamola Key, MD  Current outpatient prescriptions:  .  albuterol (PROVENTIL HFA;VENTOLIN HFA) 108 (90 BASE) MCG/ACT inhaler, Inhale 2 puffs into the lungs every 6 (six) hours as needed for wheezing or shortness of breath., Disp: , Rfl:  .  albuterol (PROVENTIL) (2.5 MG/3ML) 0.083% nebulizer  solution, Take 2.5 mg by nebulization every 6 (six) hours as needed for wheezing or shortness of breath., Disp: , Rfl:  .  Cholecalciferol (VITAMIN D-3) 1000 UNITS CAPS, Take 1,000 Units by mouth daily., Disp: , Rfl:  .  digoxin (LANOXIN) 0.125 MG tablet, Take 1 tablet (0.125 mg total) by mouth daily., Disp: 30 tablet, Rfl: 0 .  diltiazem (TIAZAC) 180 MG 24 hr capsule, Take 1 capsule (180 mg total) by mouth daily., Disp: 30 capsule, Rfl: 0 .  donepezil (ARICEPT) 5 MG tablet, Take 5 mg by mouth at bedtime., Disp: , Rfl:  .  fluticasone (FLONASE) 50 MCG/ACT nasal spray, Place 1 spray into both nostrils daily., Disp: 16 g, Rfl: 2 .  furosemide (LASIX) 40 MG tablet, Take 40 mg by mouth daily., Disp: , Rfl:  .  HYDROcodone-acetaminophen (NORCO) 10-325 MG tablet, Take 1-2 tablets by mouth every 6 (six) hours as needed., Disp: 60 tablet, Rfl: 0 .  insulin aspart (NOVOLOG) 100 UNIT/ML injection, Inject 8-12 Units into the skin 3 (three) times daily with meals. Below 200=8 units and every 50 units above she gets one extra unit up to 12 units, Disp: , Rfl:  .  insulin glargine (LANTUS) 100 UNIT/ML injection, Inject 30 Units into the skin every morning. , Disp: , Rfl:  .  lisinopril (PRINIVIL,ZESTRIL) 5 MG tablet, Take 5 mg by mouth daily., Disp: , Rfl:  .  meloxicam (MOBIC) 7.5 MG tablet, Take 1 tablet (7.5 mg total) by mouth 2 (two) times daily after a meal. (Patient not taking: Reported on 06/09/2015), Disp: 28 tablet, Rfl: 2 .  metoprolol succinate (TOPROL-XL) 100 MG 24 hr tablet, Take 100 mg by mouth daily. Take with or immediately following a meal., Disp: , Rfl:  .  nystatin (MYCOSTATIN) powder, Apply 1 g topically 2 (two) times daily as needed., Disp: , Rfl:  .  oxymetazoline (AFRIN) 0.05 % nasal spray, Place 1 spray into both nostrils 2 (two) times daily., Disp: , Rfl:  .  oxymetazoline (VICKS SINEX) 0.05 % nasal spray, Place 1 spray into both nostrils 2 (two) times daily., Disp: , Rfl:  .  rivaroxaban  (XARELTO) 20 MG TABS tablet, Take 20 mg by mouth daily., Disp: , Rfl:  .  simvastatin (ZOCOR) 10 MG tablet, Take 10 mg by mouth daily., Disp: , Rfl:  .  zolpidem (AMBIEN) 5 MG tablet, Take 5 mg by mouth at bedtime as needed for sleep., Disp: , Rfl:   IMAGING    Dg Chest 1 View  07/17/2015  CLINICAL DATA:  Altered mental status. EXAM: CHEST 1 VIEW COMPARISON:  April 11, 2015. FINDINGS: Stable cardiomegaly. Right lung is clear. No pneumothorax or significant pleural effusion is noted. New airspace opacity is noted in left upper lobe most consistent with pneumonia. Bony thorax is unremarkable. IMPRESSION: Probable left upper lobe pneumonia. Continued radiographic followup is recommended to ensure resolution. Electronically Signed   By: Lupita Raider, M.D.   On: 07/17/2015 13:25   Ct Angio Chest Pe W/cm &/or Wo Cm  07/17/2015  CLINICAL DATA:  Dyspnea.  Atrial fibrillation. EXAM: CT ANGIOGRAPHY CHEST WITH CONTRAST TECHNIQUE: Multidetector CT imaging of the chest was performed using the standard protocol during bolus administration of intravenous contrast. Multiplanar CT image reconstructions and MIPs were obtained to evaluate the vascular anatomy. CONTRAST:  80mL OMNIPAQUE IOHEXOL 350 MG/ML SOLN COMPARISON:  04/06/2015 chest CT. Chest radiograph from earlier today. FINDINGS: Mediastinum/Nodes: The study is moderate quality for the evaluation of pulmonary embolism, with evaluation of the subsegmental pulmonary arteries limited by motion artifact. There are no filling defects in the central, lobar or segmental pulmonary artery branches to suggest acute pulmonary embolism. Great vessels are normal in course and caliber. Mild cardiomegaly . No  pericardial fluid/thickening. Left anterior descending, left circumflex and right coronary atherosclerosis. Normal visualized thyroid. There is fluid and debris in the mid thoracic esophagus. No axillary adenopathy. Mildly enlarged 1.0 cm right paratracheal node (series  4/ image 47). Mildly enlarged 1.3 cm subcarinal node (4/62). Mildly enlarged 1.2 cm left infrahilar node (4/66) . Mildly enlarged 1.2 cm right infrahilar node (4/72). Lungs/Pleura: No pneumothorax. No pleural effusion. There is severe patchy consolidation and ground-glass opacity involving most of the left upper lobe including the lingula, with internal air bronchograms and no appreciable central airway stenosis. There is a subsolid 2.1 x 1.2 cm nodule in the posterior right lower lobe (series 6/ image 75), not definitely seen on the prior chest CT. Dependent density throughout both lungs likely represents passive atelectasis. Upper abdomen: Stable simple 6.5 cm renal cyst in the upper left kidney. Musculoskeletal: No aggressive appearing focal osseous lesions. Moderate degenerative changes in the thoracic spine. Partially visualized is posterior spinal fusion hardware in the upper lumbar spine. Review of the MIP images confirms the above findings. IMPRESSION: 1. No evidence of pulmonary embolism, with mild limitations as described. 2. Severe patchy consolidation and ground-glass opacity involving most of the left upper lobe, with internal air bronchograms. New subsolid nodule in the posterior right lower lobe. No appreciable central airway stenosis. These findings are favored to represent a multilobar infectious pneumonia, however an underlying neoplasm is not entirely excluded and a follow-up post treatment chest CT is advised in 6-8 weeks. 3. Mild mediastinal and bilateral hilar lymphadenopathy, nonspecific. 4. Three-vessel coronary atherosclerosis. Electronically Signed   By: Delbert Phenix M.D.   On: 07/17/2015 16:08      Indwelling Urinary Catheter continued, requirement due to   Reason to continue Indwelling Urinary Catheter for strict Intake/Output monitoring for hemodynamic instability   Central Line continued, requirement due to   Reason to continue [employer] Monitoring of central venous  pressure or other hemodynamic parameters   Ventilator continued, requirement due to, resp failure    Ventilator Sedation RASS 0 to -2   Cultures: BCx2 12/2>> UC  Sputum 12/2>>  Antibiotics: Levaquin 12/2>> Zosyn 12/2>>  Lines:   ASSESSMENT/PLAN   81 year old female past medical history of COPD, atrial fibrillation with RVR, group B strep bacteremia 03/2015, had omitted for multifocal pneumonia with hypoxia, mainly in the left upper lobe.  Sepsis - due to multilobar pneumonia - LUL  - cont with levaquin and zosyn, treating HCAP - monitor cbc - maintain MAP>65 - IVF as tolerated - monitor UOP - respiratory and blood cultures - CT chest with no PE, but significant LUL PNA - check procalcitonin  Acute respiratory failure with hypoxia.  - maintain O2 sats >88% - wears up 2L [place] at home - given mild confusion and LUL PNA, she is not the best candidate for Bipap at this time - may use HFNC if needed - scheduled xopenox for the next 3 days - ICS  Left upper lobe pneumonia - currently started on IV abx - f/u resp and bld cx  Hemoptysis - CTA neg for PE - secondary to LUL PNA - ok to cont with anticoagulation for afib - monitor Plts and H\H  A. Fib with RVR - most likely 2/2 LUL PNA and sepsis - cont with  amiodarone IV drip after bolus - cont with dig and cardizem as BP tolerates  Hypotension - 2/2 to sepsis and afib with RVR - hold beta blockers - cont with IVFs, gentle - IV Abx -  monitor UOP  Renal insufficiency - chronic - got CTA, will f/u Cr and GFR - renal following  I have personally obtained a history, examined the patient, evaluated laboratory and imaging results, formulated the assessment and plan and placed orders.  Pulmonary Consult time -  Stephanie Acre, MD [facility] Pager [phone] (please enter 7-digits) On Call Pager - [phone] (please enter 7-digits)   07/17/2015, 4:20 PM  Note: This note was  prepared with Dragon dictation along with smaller phrase technology. Any transcriptional errors that result from this process are unintentional.

## 2015-07-17 NOTE — Consult Note (Signed)
ANTIBIOTIC CONSULT NOTE - INITIAL  Pharmacy Consult for Levofloxacin and Zosyn Indication: pneumonia/UTI  Allergies  Allergen Reactions  . Hydromorphone Hcl Nausea And Vomiting  . Ibuprofen Other (See Comments)    Pt was told by her MD not to take this medication.    . Mucinex [Guaifenesin Er] Other (See Comments)    Red all over. Felt like i was on fire  . Percocet [Oxycodone-Acetaminophen] Hives   Patient Measurements: Weight: 220 lb (99.791 kg)  Vital Signs: Temp: 99.9 F (37.7 C) (12/02 1154) Temp Source: Oral (12/02 1154) BP: 119/84 mmHg (12/02 1603) Pulse Rate: 160 (12/02 1500)  Recent Labs  07/17/15 1158 07/17/15 1250  WBC 18.4*  --   HGB 13.8  --   PLT 250  --   CREATININE  --  1.04*   Estimated Creatinine Clearance: 51.3 mL/min (by C-G formula based on Cr of 1.04).   Microbiology: No results found for this or any previous visit (from the past 720 hour(s)).  Medical History: Past Medical History  Diagnosis Date  . COPD (chronic obstructive pulmonary disease) (HCC)   . Atrial fibrillation (HCC)   . Hypertension   . Hypercholesteremia   . Pneumonia   . Diabetes mellitus without complication (HCC)     type 2  . Headache     migraines - years ago  . Complication of anesthesia     difficulty to wake up after an 8 hour back surgery   Assessment: 81 yo female presenting to ED with A.Fib, worsening confusion, and  Leukocytosis. Chest X-ray shows LUL PNA and UA is nitrite positive with many bacteria.  Pharmacy was verbally consulted for Zosyn and Levofloxacin dosing per ICU MD. Vancomycin consult was verbally D'cd by Dr. Dema SeverinMungal. MD stated that patient has bilateral leg swelling that is suspicious for cellulitis as well.  He is aware of double coverage at this time, but patient has had recent hospital visits.   CrCl: 51.3   Scr: 1.04    Plan:  Will start patient on levofloxacin 750mg  daily and Zosyn 3.375 EI q8 hours.  Pharmacy will continue to monitor  patients renal function and labs and make adjustments as needed.   Cher NakaiSheema Ghazal Pevey, PharmD Pharmacy Resident 07/17/2015,5:00 PM

## 2015-07-17 NOTE — ED Notes (Signed)
MD to bedside, discussion with pt's husband.

## 2015-07-17 NOTE — ED Notes (Signed)
Pt here with AMS since last night; EMS reports pt vomiting since last night, vomiting bright red blood in EMS truck. EMS reports HR 150-160, afib RVR. Pt not alert upon arrival, yelling for [name].

## 2015-07-17 NOTE — H&P (Signed)
[facility] -  at [facility]   PATIENT NAME: Allison Shepard    MR#:  [id number]  DATE OF BIRTH:  [birth year]  DATE OF ADMISSION:  07/17/2015  PRIMARY CARE PHYSICIAN: Mickey Farber, MD   REQUESTING/REFERRING PHYSICIAN:   CHIEF COMPLAINT:   Chief Complaint  Patient presents with  . Altered Mental Status    HISTORY OF PRESENT ILLNESS: Allison Shepard  is a 81 y.o. female with a known history of multiple medical problems including atrial fibrillation, chronic hypertension, hyperlipidemia, obesity diabetes, recent admission for pneumonia presents back to the hospital with complaints of worsening confusion, and the patient's husband, patient woke her him up at nighttime at around 1 AM and requested to take shower, patient's husband was able to calm patient down. However, patient woke him up again few times again, during nighttime. She was coughing up phlegm with some blood. Patient's daughter was called and patient was brought to emergency room for further evaluation. In emergency room, she was noted to be in A. fib, RVR, heart rate reaching 150 intermittently. She was given a few doses of Cardizem as well as metoprolol with no significant improvement in her A. fib, RVR, but just dropping her blood pressure. Patient's chest x-ray revealed left upper lobe pneumonia. Labs showed mild renal insufficiency as well as significant leukocytosis. Hospitalist services were contacted for admission. Patient herself is not able to provide review of systems, but denies any pains in the chest.   PAST MEDICAL HISTORY:   Past Medical History  Diagnosis Date  . COPD (chronic obstructive pulmonary disease) (HCC)   . Atrial fibrillation (HCC)   . Hypertension   . Hypercholesteremia   . Pneumonia   . Diabetes mellitus without complication (HCC)     type 2  . Headache     migraines - years ago  . Complication of anesthesia     difficulty to wake up after an 8 hour back  surgery    PAST SURGICAL HISTORY:  Past Surgical History  Procedure Laterality Date  . Abdominal hysterectomy    . Shoulder fusion surgery Right   . Knee surgery Right     knee replacement  . Hip surgery Right     hip replacement  . Back surgery    . Eye surgery      cataract surgery ? eye  . Fracture surgery      right arm 20 years ago  . Colonoscopy      x 3  . Orif distal radius fracture Left 06/17/2015  . Open reduction internal fixation (orif) distal radial fracture Left 06/17/2015    Procedure: OPEN REDUCTION INTERNAL FIXATION LEFT  DISTAL RADIUS AND ULNA FRACTURES;  Surgeon: Tarry Kos, MD;  Location: [facility] OR;  Service: Orthopedics;  Laterality: Left;    SOCIAL HISTORY:  Social History  Substance Use Topics  . Smoking status: Former Smoker    Quit date: 06/15/1992  . Smokeless tobacco: Never Used  . Alcohol Use: 8.4 oz/week    7 Glasses of wine, 7 Shots of liquor, 0 Standard drinks or equivalent per week     Comment: daily    FAMILY HISTORY:  Family History  Problem Relation Age of Onset  . Heart disease Father     DRUG ALLERGIES:  Allergies  Allergen Reactions  . Hydromorphone Hcl Nausea And Vomiting  . Ibuprofen Other (See Comments)    Pt was told by her MD not to take this medication.    Marland Kitchen  Mucinex [Guaifenesin Er] Other (See Comments)    Red all over. Felt like i was on fire  . Percocet [Oxycodone-Acetaminophen] Hives    Review of Systems  Unable to perform ROS: other   patient is confused, although denies pain  MEDICATIONS AT HOME:  Prior to Admission medications   Medication Sig Start Date End Date Taking? Authorizing Provider  albuterol (PROVENTIL HFA;VENTOLIN HFA) 108 (90 BASE) MCG/ACT inhaler Inhale 2 puffs into the lungs every 6 (six) hours as needed for wheezing or shortness of breath.    Historical Provider, MD  albuterol (PROVENTIL) (2.5 MG/3ML) 0.083% nebulizer solution Take 2.5 mg by nebulization every 6 (six) hours as needed for  wheezing or shortness of breath.    Historical Provider, MD  Cholecalciferol (VITAMIN D-3) 1000 UNITS CAPS Take 1,000 Units by mouth daily.    Historical Provider, MD  digoxin (LANOXIN) 0.125 MG tablet Take 1 tablet (0.125 mg total) by mouth daily. 04/14/15   Adrian Saran, MD  diltiazem (TIAZAC) 180 MG 24 hr capsule Take 1 capsule (180 mg total) by mouth daily. 04/14/15   Sital Mody, MD  donepezil (ARICEPT) 5 MG tablet Take 5 mg by mouth at bedtime.    Historical Provider, MD  fluticasone (FLONASE) 50 MCG/ACT nasal spray Place 1 spray into both nostrils daily. 03/20/15   Auburn Bilberry, MD  furosemide (LASIX) 40 MG tablet Take 40 mg by mouth daily.    Historical Provider, MD  HYDROcodone-acetaminophen (NORCO) 10-325 MG tablet Take 1-2 tablets by mouth every 6 (six) hours as needed. 06/17/15   Naiping Donnelly Stager, MD  insulin aspart (NOVOLOG) 100 UNIT/ML injection Inject 8-12 Units into the skin 3 (three) times daily with meals. Below 200=8 units and every 50 units above she gets one extra unit up to 12 units    Historical Provider, MD  insulin glargine (LANTUS) 100 UNIT/ML injection Inject 30 Units into the skin every morning.     Historical Provider, MD  lisinopril (PRINIVIL,ZESTRIL) 5 MG tablet Take 5 mg by mouth daily.    Historical Provider, MD  meloxicam (MOBIC) 7.5 MG tablet Take 1 tablet (7.5 mg total) by mouth 2 (two) times daily after a meal. Patient not taking: Reported on 06/09/2015 03/11/15   Tod Persia, MD  metoprolol succinate (TOPROL-XL) 100 MG 24 hr tablet Take 100 mg by mouth daily. Take with or immediately following a meal.    Historical Provider, MD  nystatin (MYCOSTATIN) powder Apply 1 g topically 2 (two) times daily as needed.    Historical Provider, MD  oxymetazoline (AFRIN) 0.05 % nasal spray Place 1 spray into both nostrils 2 (two) times daily.    Historical Provider, MD  oxymetazoline (VICKS SINEX) 0.05 % nasal spray Place 1 spray into both nostrils 2 (two) times daily.    Historical  Provider, MD  rivaroxaban (XARELTO) 20 MG TABS tablet Take 20 mg by mouth daily.    Historical Provider, MD  simvastatin (ZOCOR) 10 MG tablet Take 10 mg by mouth daily.    Historical Provider, MD  zolpidem (AMBIEN) 5 MG tablet Take 5 mg by mouth at bedtime as needed for sleep.    Historical Provider, MD      PHYSICAL EXAMINATION:   VITAL SIGNS: Blood pressure 66/38, pulse 155, temperature 99.9 F (37.7 C), temperature source Oral, resp. rate 32, SpO2 91 %.  GENERAL:  81 y.o.-year-old patient lying in the bed in moderate distress, confused, restless, somewhat diaphoretic, dyspneic.  EYES: Pupils equal, round, reactive to light  and accommodation. No scleral icterus. Extraocular muscles intact.  HEENT: Head atraumatic, normocephalic. Oropharynx and nasopharynx clear. Dried blood around the mouth NECK:  Supple, no jugular venous distention. No thyroid enlargement, no tenderness.  LUNGS: Normal breath sounds bilaterally, no wheezing, bilateral rales,rhonchi and crepitations noted, mostly on the left side and some upper airways on the right. Using accessory muscles of respiration.  CARDIOVASCULAR: S1, S2 , irregularly irregular, tachycardic, difficult to hear well due to adventitious sounds in the lungs. No murmurs, rubs, or gallops.  ABDOMEN: Soft, nontender, nondistended. Bowel sounds present. No organomegaly or mass.  EXTREMITIES: 3+ lower extremity and pedal edema, no cyanosis, or clubbing.  NEUROLOGIC: Cranial nerves II through XII are intact. Muscle strength 5/5 in all extremities. Sensation intact. Gait not checked.  PSYCHIATRIC: The patient is alert and oriented x 3.  SKIN: No obvious rash, lesion, or ulcer.   LABORATORY PANEL:   CBC  Recent Labs Lab 07/17/15 1158  WBC 18.4*  HGB 13.8  HCT 42.7  PLT 250  MCV 99.1  MCH 31.9  MCHC 32.2  RDW 15.7*  LYMPHSABS 1.3  MONOABS 0.8  EOSABS 0.0  BASOSABS 0.1    ------------------------------------------------------------------------------------------------------------------  Chemistries   Recent Labs Lab 07/17/15 1250  NA 137  K 3.9  CL 104  CO2 26  GLUCOSE 144*  BUN 22*  CREATININE 1.04*  CALCIUM 8.2*  AST 15  ALT 15  ALKPHOS 101  BILITOT 0.5   ------------------------------------------------------------------------------------------------------------------  Cardiac Enzymes  Recent Labs Lab 07/17/15 1250  TROPONINI 0.03   ------------------------------------------------------------------------------------------------------------------  RADIOLOGY: Dg Chest 1 View  07/17/2015  CLINICAL DATA:  Altered mental status. EXAM: CHEST 1 VIEW COMPARISON:  April 11, 2015. FINDINGS: Stable cardiomegaly. Right lung is clear. No pneumothorax or significant pleural effusion is noted. New airspace opacity is noted in left upper lobe most consistent with pneumonia. Bony thorax is unremarkable. IMPRESSION: Probable left upper lobe pneumonia. Continued radiographic followup is recommended to ensure resolution. Electronically Signed   By: Lupita RaiderJames  Green Jr, M.D.   On: 07/17/2015 13:25    EKG: Orders placed or performed during the hospital encounter of 07/17/15  . ED EKG  . ED EKG   EKG in the emergency room revealed A. fib at a rate of 164, normal axis, nonspecific ST-T changes, prolonged QTC to 466 ms  IMPRESSION AND PLAN:  Active Problems:   Acute respiratory failure with hypoxia (HCC)   Pneumonia   Left upper lobe pneumonia   Hemoptysis   Atrial fibrillation with RVR (HCC)   Hypotension   Renal insufficiency   Obesity   Metabolic encephalopathy 1. Sepsis. Admit the patient, medical floor intensive care unit, blood cultures taken. Get sputum cultures if possible, Initiate patient on levofloxacin, vancomycin and Zosyn due to recent hospitalization 2. Acute respiratory failure with hypoxia. Continue oxygen therapy as needed, This  pulmonologist involved for further recommendations 3. Left upper lobe pneumonia, initiated patient on broad-spectrum antibiotic therapy 4. Hemoptysis, hold anticoagulants, patient will have CT angiogram done to rule out pulmonary embolism 5. A. fib, RVR, initiate patient on amiodarone IV drip after bolus, get TSH 6. Hypotension, possibly sepsis as well as beta blockers and calcium channel blockers used for A. fib, RVR related, follow closely, continue patient on low rate IV fluids.  7. Renal insufficiency. Follow after CT angiogram 8. Metabolic encephalopathy. Supportive therapy  3    All the records are reviewed and case discussed with ED provider. Management plans discussed with the patient, family and they are  in agreement.  CODE STATUS:    TOTAL  critical care TIME TAKING CARE OF THIS PATIENT: 60 minutes.  Discussed with intensivist pulmonologist, Dr. Lovena Le M.D on 07/17/2015 at 3:26 PM  Between 7am to 6pm - Pager - [phone] After 6pm go to [email or web address] - password [email or web address] [facility]  [facility]  Office  [phone]  CC: Primary care physician; Mickey Farber, MD

## 2015-07-17 NOTE — ED Notes (Signed)
Pt resting quietly, no distress noted . Will continue to monitor.

## 2015-07-17 NOTE — ED Provider Notes (Signed)
[facility] Emergency Department Provider Note     Time seen: ----------------------------------------- 12:10 PM on 07/17/2015 -----------------------------------------  L5 caveat: Review of systems and history is limited due to altered mental status  I have reviewed the triage vital signs and the nursing notes.   HISTORY  Chief Complaint Altered Mental Status    HPI Allison Shepard is a 81 y.o. female who presents ER for altered mental status last night.Patient had vomiting of bright red blood while in the EMS truck. EMS reports heart rate in the 150s to 160s noted to be in rapid A. fib. Patient was not alert yelling for [name] on arrival. Patient seemed very distressed on arrival, is difficult to tell what is bothering her.   Past Medical History  Diagnosis Date  . COPD (chronic obstructive pulmonary disease) (HCC)   . Atrial fibrillation (HCC)   . Hypertension   . Hypercholesteremia   . Pneumonia   . Diabetes mellitus without complication (HCC)     type 2  . Headache     migraines - years ago  . Complication of anesthesia     difficulty to wake up after an 8 hour back surgery    Patient Active Problem List   Diagnosis Date Noted  . Fracture of radius, distal, with ulna, left, closed 06/17/2015  . S/P ORIF (open reduction internal fixation) fracture 06/17/2015  . Diaphragmatic hernia 04/07/2015  . Restrictive lung disease secondary to obesity 04/07/2015  . Pneumonia 04/07/2015  . COPD exacerbation (HCC) 04/07/2015  . Other emphysema (HCC) 04/07/2015  . Morbid obesity (HCC) 04/07/2015  . Debility 04/07/2015  . Sepsis (HCC) 04/06/2015  . Chronic a-fib (HCC) 03/20/2015  . Acute respiratory distress (HCC) 03/16/2015    Past Surgical History  Procedure Laterality Date  . Abdominal hysterectomy    . Shoulder fusion surgery Right   . Knee surgery Right     knee replacement  . Hip surgery Right     hip replacement  . Back surgery    .  Eye surgery      cataract surgery ? eye  . Fracture surgery      right arm 20 years ago  . Colonoscopy      x 3  . Orif distal radius fracture Left 06/17/2015  . Open reduction internal fixation (orif) distal radial fracture Left 06/17/2015    Procedure: OPEN REDUCTION INTERNAL FIXATION LEFT  DISTAL RADIUS AND ULNA FRACTURES;  Surgeon: Tarry Kos, MD;  Location: [facility] OR;  Service: Orthopedics;  Laterality: Left;    Allergies Hydromorphone hcl; Ibuprofen; Mucinex; and Percocet  Social History Social History  Substance Use Topics  . Smoking status: Former Smoker    Quit date: 06/15/1992  . Smokeless tobacco: Never Used  . Alcohol Use: 8.4 oz/week    7 Glasses of wine, 7 Shots of liquor, 0 Standard drinks or equivalent per week     Comment: daily    Review of Systems Positive for hematemesis, positive for altered mental status  ____________________________________________   PHYSICAL EXAM:  VITAL SIGNS: ED Triage Vitals  Enc Vitals Group     BP --      Pulse --      Resp --      Temp 07/17/15 1154 99.9 F (37.7 C)     Temp Source 07/17/15 1154 Oral     SpO2 07/17/15 1152 96 %     Weight --      Height --  Head Cir --      Peak Flow --      Pain Score --      Pain Loc --      Pain Edu? --      Excl. in GC? --     Constitutional: Patient is not alert and is disoriented, mild to moderate distress. Yelling for [name] Eyes: Conjunctivae are normal. PERRL. Normal extraocular movements. ENT   Head: Normocephalic and atraumatic.   Nose: No congestion/rhinnorhea.   Mouth/Throat: Bright red blood in her mouth.   Neck: No stridor. Cardiovascular: Rapid rate, irregular rhythm. Normal and symmetric distal pulses are present in all extremities.  Respiratory: Normal respiratory effort without tachypnea nor retractions. Breath sounds are clear and equal bilaterally. Mildly Rhonchorous breath sounds Gastrointestinal: Soft and nontender. No distention.   Musculoskeletal:  No lower extremity tenderness nor edema. Patient is wearing a left wrist brace. Neurologic:  Normal speech and language but patient is confused. She is moving all other extremities well. Skin:  Skin is warm, dry and intact. No rash noted. ____________________________________________  EKG: Interpreted by me. Atrial fibrillation with rapid ventricular response. Rate is 164 bpm, normal QRS with, normal QT interval. Normal axis.  ____________________________________________  ED COURSE:  Pertinent labs & imaging results that were available during my care of the patient were reviewed by me and considered in my medical decision making (see chart for details). Patient is in acute distress and is very agitated with rapid A. fib and hematemesis. Patient needing critical care interventions including saline, IV Protonix, IV Zofran, IV Cardizem, IV Ativan. She presents very tachycardic and very hypertensive which is likely some anxiety related. CRITICAL CARE Performed by: Emily FilbertWilliams, Jonathan E   Total critical care time: 30 minutes  Critical care time was exclusive of separately billable procedures and treating other patients.  Critical care was necessary to treat or prevent imminent or life-threatening deterioration.  Critical care was time spent personally by me on the following activities: development of treatment plan with patient and/or surrogate as well as nursing, discussions with consultants, evaluation of patient's response to treatment, examination of patient, obtaining history from patient or surrogate, ordering and performing treatments and interventions, ordering and review of laboratory studies, ordering and review of radiographic studies, pulse oximetry and re-evaluation of patient's condition.  ____________________________________________    LABS (pertinent positives/negatives)  Labs Reviewed  CBC WITH DIFFERENTIAL/PLATELET - Abnormal; Notable for the following:     WBC 18.4 (*)    RDW 15.7 (*)    Neutro Abs 16.2 (*)    All other components within normal limits  PROTIME-INR - Abnormal; Notable for the following:    Prothrombin Time 24.6 (*)    All other components within normal limits  COMPREHENSIVE METABOLIC PANEL - Abnormal; Notable for the following:    Glucose, Bld 144 (*)    BUN 22 (*)    Creatinine, Ser 1.04 (*)    Calcium 8.2 (*)    Total Protein 6.0 (*)    Albumin 3.1 (*)    GFR calc non Af Amer 50 (*)    GFR calc Af Amer 58 (*)    All other components within normal limits  CULTURE, BLOOD (ROUTINE X 2)  CULTURE, BLOOD (ROUTINE X 2)  BLOOD GAS, VENOUS  TROPONIN I  URINALYSIS COMPLETEWITH MICROSCOPIC ([facility] ONLY)  LACTIC ACID, PLASMA  LACTIC ACID, PLASMA  TYPE AND SCREEN  ABO/RH    RADIOLOGY Images were viewed by me  Chest x-ray IMPRESSION: Probable left  upper lobe pneumonia. Continued radiographic followup is recommended to ensure resolution. ____________________________________________  FINAL ASSESSMENT AND PLAN  Rapid atrial fibrillation, hematemesis, altered mental status, pneumonia  Plan: Patient with labs and imaging as dictated above. Patient was started on IV Levaquin as well as the above-mentioned medications for treatment of what looks like left upper lobe pneumonia. She is currently on a Cardizem drip to control her heart rate which is trending down nicely.  Patient remains agitated at times, currently her vitals have stabilized but she remains mildly tachycardic. She had transient hypotension which was blood pressure cuff problem. She remains in critical condition at this time.   Emily Filbert, MD   Emily Filbert, MD 07/17/15 [phone]

## 2015-07-18 ENCOUNTER — Inpatient Hospital Stay: Payer: Medicare Other

## 2015-07-18 ENCOUNTER — Encounter: Payer: Self-pay | Admitting: Oncology

## 2015-07-18 LAB — BASIC METABOLIC PANEL
Anion gap: 4 — ABNORMAL LOW (ref 5–15)
BUN: 20 mg/dL (ref 6–20)
CHLORIDE: 106 mmol/L (ref 101–111)
CO2: 26 mmol/L (ref 22–32)
CREATININE: 1.13 mg/dL — AB (ref 0.44–1.00)
Calcium: 7.9 mg/dL — ABNORMAL LOW (ref 8.9–10.3)
GFR, EST AFRICAN AMERICAN: 52 mL/min — AB (ref >60)
GFR, EST NON AFRICAN AMERICAN: 45 mL/min — AB (ref >60)
Glucose, Bld: 125 mg/dL — ABNORMAL HIGH (ref 65–99)
POTASSIUM: 4.1 mmol/L (ref 3.5–5.1)
Sodium: 136 mmol/L (ref 135–145)

## 2015-07-18 LAB — GLUCOSE, CAPILLARY
GLUCOSE-CAPILLARY: 122 mg/dL — AB (ref 65–99)
GLUCOSE-CAPILLARY: 121 mg/dL — AB (ref 65–99)
GLUCOSE-CAPILLARY: 173 mg/dL — AB (ref 65–99)
Glucose-Capillary: 167 mg/dL — ABNORMAL HIGH (ref 65–99)
Glucose-Capillary: 177 mg/dL — ABNORMAL HIGH (ref 65–99)

## 2015-07-18 LAB — CBC
HCT: 32.4 % — ABNORMAL LOW (ref 35.0–47.0)
HEMOGLOBIN: 10.6 g/dL — AB (ref 12.0–16.0)
MCH: 32.3 pg (ref 26.0–34.0)
MCHC: 32.7 g/dL (ref 32.0–36.0)
MCV: 98.8 fL (ref 80.0–100.0)
Platelets: 168 10*3/uL (ref 150–440)
RBC: 3.28 MIL/uL — ABNORMAL LOW (ref 3.80–5.20)
RDW: 16.2 % — AB (ref 11.5–14.5)
WBC: 11.6 10*3/uL — AB (ref 3.6–11.0)

## 2015-07-18 LAB — PROCALCITONIN: PROCALCITONIN: 1.91 ng/mL

## 2015-07-18 MED ORDER — VANCOMYCIN HCL 10 G IV SOLR
1250.0000 mg | Freq: Once | INTRAVENOUS | Status: AC
  Administered 2015-07-18: 1250 mg via INTRAVENOUS
  Filled 2015-07-18: qty 1250

## 2015-07-18 MED ORDER — PIPERACILLIN-TAZOBACTAM 3.375 G IVPB
3.3750 g | Freq: Three times a day (TID) | INTRAVENOUS | Status: DC
  Administered 2015-07-19 – 2015-07-20 (×3): 3.375 g via INTRAVENOUS
  Filled 2015-07-18 (×5): qty 50

## 2015-07-18 MED ORDER — PHENAZOPYRIDINE HCL 100 MG PO TABS
100.0000 mg | ORAL_TABLET | Freq: Three times a day (TID) | ORAL | Status: DC
  Administered 2015-07-19 – 2015-07-20 (×5): 100 mg via ORAL
  Filled 2015-07-18 (×6): qty 1

## 2015-07-18 MED ORDER — INSULIN ASPART 100 UNIT/ML ~~LOC~~ SOLN
0.0000 [IU] | Freq: Three times a day (TID) | SUBCUTANEOUS | Status: DC
  Administered 2015-07-18: 2 [IU] via SUBCUTANEOUS
  Administered 2015-07-18: 1 [IU] via SUBCUTANEOUS
  Administered 2015-07-19 (×3): 2 [IU] via SUBCUTANEOUS
  Administered 2015-07-20: 3 [IU] via SUBCUTANEOUS
  Filled 2015-07-18 (×3): qty 2
  Filled 2015-07-18: qty 3
  Filled 2015-07-18: qty 1
  Filled 2015-07-18: qty 2

## 2015-07-18 MED ORDER — RIVAROXABAN 10 MG PO TABS
20.0000 mg | ORAL_TABLET | Freq: Every day | ORAL | Status: DC
  Administered 2015-07-18 – 2015-07-19 (×2): 20 mg via ORAL
  Filled 2015-07-18 (×2): qty 2

## 2015-07-18 MED ORDER — PNEUMOCOCCAL VAC POLYVALENT 25 MCG/0.5ML IJ INJ
0.5000 mL | INJECTION | INTRAMUSCULAR | Status: AC
  Administered 2015-07-19: 0.5 mL via INTRAMUSCULAR
  Filled 2015-07-18: qty 0.5

## 2015-07-18 MED ORDER — VANCOMYCIN HCL 10 G IV SOLR
1250.0000 mg | INTRAVENOUS | Status: DC
  Administered 2015-07-18 – 2015-07-20 (×3): 1250 mg via INTRAVENOUS
  Filled 2015-07-18 (×4): qty 1250

## 2015-07-18 NOTE — Progress Notes (Signed)
Notified by lab of gram positive cocci in the aerobic bottle, notified Dr. Arsenio LoaderSommer in eLink.

## 2015-07-18 NOTE — Progress Notes (Signed)
[facility]  ROUNDING NOTE   Subjective:  Patient is well known to us. We are asked to see her by pulmonary critical care. Patient had CT scan with contrast looking for PE. She has underlying chronic kidney disease with baseline creatinine 1.1 and EGFR of 47. She was last seen in our office on 09/23/14. She appears to have atrial fibrillation with rapid ventricular response at present.   Objective:  Vital signs in last 24 hours:  Temp:  [97.7 F (36.5 C)-99.9 F (37.7 C)] 97.7 F (36.5 C) (12/03 0728) Pulse Rate:  [53-160] 71 (12/03 0900) Resp:  [16-34] 29 (12/03 0900) BP: (66-135)/(38-108) 128/108 mmHg (12/03 0900) SpO2:  [86 %-100 %] 86 % (12/03 0900) FiO2 (%):  [36 %] 36 % (12/02 1729) Weight:  [99.791 kg (220 lb)-109.3 kg (240 lb 15.4 oz)] 109.3 kg (240 lb 15.4 oz) (12/02 1729)  Weight change:  Filed Weights   07/17/15 1649 07/17/15 1729  Weight: 99.791 kg (220 lb) 109.3 kg (240 lb 15.4 oz)    Intake/Output: I/O last 3 completed shifts: In: 1886.6 [I.V.:1536.6; IV Piggyback:350] Out: 600 [Urine:600]   Intake/Output this shift:  Total I/O In: -  Out: 850 [Urine:850]  Physical Exam: General: NAD, resting in bed  Head: Normocephalic, atraumatic. Moist oral mucosal membranes  Eyes: Anicteric  Neck: Supple, trachea midline  Lungs:  Clear to auscultation  Heart: Irregular tachycardic  Abdomen:  Soft, nontender, BS present  Extremities: 1+ peripheral edema.  Neurologic: Nonfocal, moving all four extremities  Skin: No lesions       Basic Metabolic Panel:  Recent Labs Lab 07/17/15 1250 07/18/15 0428  NA 137 136  K 3.9 4.1  CL 104 106  CO2 26 26  GLUCOSE 144* 125*  BUN 22* 20  CREATININE 1.04* 1.13*  CALCIUM 8.2* 7.9*  MG 1.7  --     Liver Function Tests:  Recent Labs Lab 07/17/15 1250  AST 15  ALT 15  ALKPHOS 101  BILITOT 0.5  PROT 6.0*  ALBUMIN 3.1*   No results for input(s): LIPASE, AMYLASE in the last 168 hours. No results  for input(s): AMMONIA in the last 168 hours.  CBC:  Recent Labs Lab 07/17/15 1158 07/18/15 0428  WBC 18.4* 11.6*  NEUTROABS 16.2*  --   HGB 13.8 10.6*  HCT 42.7 32.4*  MCV 99.1 98.8  PLT 250 168    Cardiac Enzymes:  Recent Labs Lab 07/17/15 1250  TROPONINI 0.03    BNP: Invalid input(s): POCBNP  CBG:  Recent Labs Lab 07/17/15 1734 07/18/15 0811  GLUCAP 150* 122*    Microbiology: Results for orders placed or performed during the hospital encounter of 07/17/15  Blood culture (routine x 2)     Status: None (Preliminary result)   Collection Time: 07/17/15 11:58 AM  Result Value Ref Range Status   Specimen Description BLOOD RIGHT ASSIST CONTROL  Final   Special Requests BAA,5ML,AER,ANA  Final   Culture NO GROWTH < 24 HOURS  Final   Report Status PENDING  Incomplete  Blood culture (routine x 2)     Status: None (Preliminary result)   Collection Time: 07/17/15 11:58 AM  Result Value Ref Range Status   Specimen Description BLOOD LEFT ASSIST CONTROL  Final   Special Requests BAA,5ML,ANA,AER  Final   Culture  Setup Time   Final    GRAM POSITIVE COCCI AEROBIC BOTTLE ONLY CRITICAL RESULT CALLED TO, READ BACK BY AND VERIFIED WITH: MICHELLE WILLIAMS AT 0356 ON 07/18/15 RWW   CONFIRMED BY [facility]    Culture   Final    GRAM POSITIVE COCCI AEROBIC BOTTLE ONLY IDENTIFICATION TO FOLLOW    Report Status PENDING  Incomplete  MRSA PCR Screening     Status: None   Collection Time: 07/17/15  6:03 PM  Result Value Ref Range Status   MRSA by PCR NEGATIVE NEGATIVE Final    Comment:        The GeneXpert MRSA Assay (FDA approved for NASAL specimens only), is one component of a comprehensive MRSA colonization surveillance program. It is not intended to diagnose MRSA infection nor to guide or monitor treatment for MRSA infections.     Coagulation Studies:  Recent Labs  07/17/15 1158  LABPROT 24.6*  INR 2.24    Urinalysis:  Recent Labs  07/17/15 1448  COLORURINE  YELLOW*  LABSPEC 1.024  PHURINE 5.0  GLUCOSEU NEGATIVE  HGBUR NEGATIVE  BILIRUBINUR NEGATIVE  KETONESUR NEGATIVE  PROTEINUR >500*  NITRITE POSITIVE*  LEUKOCYTESUR NEGATIVE      Imaging: Dg Chest 1 View  07/17/2015  CLINICAL DATA:  Altered mental status. EXAM: CHEST 1 VIEW COMPARISON:  April 11, 2015. FINDINGS: Stable cardiomegaly. Right lung is clear. No pneumothorax or significant pleural effusion is noted. New airspace opacity is noted in left upper lobe most consistent with pneumonia. Bony thorax is unremarkable. IMPRESSION: Probable left upper lobe pneumonia. Continued radiographic followup is recommended to ensure resolution. Electronically Signed   By: Marijo Conception, M.D.   On: 07/17/2015 13:25   Ct Angio Chest Pe W/cm &/or Wo Cm  07/17/2015  CLINICAL DATA:  Dyspnea.  Atrial fibrillation. EXAM: CT ANGIOGRAPHY CHEST WITH CONTRAST TECHNIQUE: Multidetector CT imaging of the chest was performed using the standard protocol during bolus administration of intravenous contrast. Multiplanar CT image reconstructions and MIPs were obtained to evaluate the vascular anatomy. CONTRAST:  46m OMNIPAQUE IOHEXOL 350 MG/ML SOLN COMPARISON:  04/06/2015 chest CT. Chest radiograph from earlier today. FINDINGS: Mediastinum/Nodes: The study is moderate quality for the evaluation of pulmonary embolism, with evaluation of the subsegmental pulmonary arteries limited by motion artifact. There are no filling defects in the central, lobar or segmental pulmonary artery branches to suggest acute pulmonary embolism. Great vessels are normal in course and caliber. Mild cardiomegaly . No pericardial fluid/thickening. Left anterior descending, left circumflex and right coronary atherosclerosis. Normal visualized thyroid. There is fluid and debris in the mid thoracic esophagus. No axillary adenopathy. Mildly enlarged 1.0 cm right paratracheal node (series 4/ image 47). Mildly enlarged 1.3 cm subcarinal node (4/62). Mildly  enlarged 1.2 cm left infrahilar node (4/66) . Mildly enlarged 1.2 cm right infrahilar node (4/72). Lungs/Pleura: No pneumothorax. No pleural effusion. There is severe patchy consolidation and ground-glass opacity involving most of the left upper lobe including the lingula, with internal air bronchograms and no appreciable central airway stenosis. There is a subsolid 2.1 x 1.2 cm nodule in the posterior right lower lobe (series 6/ image 75), not definitely seen on the prior chest CT. Dependent density throughout both lungs likely represents passive atelectasis. Upper abdomen: Stable simple 6.5 cm renal cyst in the upper left kidney. Musculoskeletal: No aggressive appearing focal osseous lesions. Moderate degenerative changes in the thoracic spine. Partially visualized is posterior spinal fusion hardware in the upper lumbar spine. Review of the MIP images confirms the above findings. IMPRESSION: 1. No evidence of pulmonary embolism, with mild limitations as described. 2. Severe patchy consolidation and ground-glass opacity involving most of the left upper lobe, with internal air bronchograms.  New subsolid nodule in the posterior right lower lobe. No appreciable central airway stenosis. These findings are favored to represent a multilobar infectious pneumonia, however an underlying neoplasm is not entirely excluded and a follow-up post treatment chest CT is advised in 6-8 weeks. 3. Mild mediastinal and bilateral hilar lymphadenopathy, nonspecific. 4. Three-vessel coronary atherosclerosis. Electronically Signed   By: Ilona Sorrel M.D.   On: 07/17/2015 16:08     Medications:   . 0.9 % NaCl with KCl 20 mEq / L 100 mL/hr at 07/17/15 1610  . amiodarone 30 mg/hr (07/17/15 2140)   . antiseptic oral rinse  7 mL Mouth Rinse BID  . digoxin  0.125 mg Oral Daily  . donepezil  5 mg Oral QHS  . fluticasone  1 spray Each Nare Daily  . furosemide  40 mg Oral Daily  . insulin aspart  0-9 Units Subcutaneous TID WC  .  insulin glargine  30 Units Subcutaneous q morning - 10a  . levalbuterol  1.25 mg Nebulization QID  . levofloxacin (LEVAQUIN) IV  750 mg Intravenous Q24H  . nystatin  1 g Topical BID  . piperacillin-tazobactam (ZOSYN)  IV  3.375 g Intravenous 3 times per day  . simvastatin  10 mg Oral Daily  . sodium chloride  3 mL Intravenous Q12H  . vancomycin  1,250 mg Intravenous Q18H   acetaminophen **OR** acetaminophen, levalbuterol, ondansetron **OR** ondansetron (ZOFRAN) IV, oxymetazoline  Assessment/ Plan:  81 y.o. female with atrial fibrillation, hypertension, COPD, osteoarthritis, hyperlipidemia, osteopenia, allergic rhinitis, lumbar spinal stenosis presents as a follow up patient chronic kidney disease stage III with proteinuria.   1. Chronic Kidney Disease stage III/proteinuria/diabetes mellitus type 2 with CKD - Patient received IV contrast as part of CT angiogram of the chest. However creatinine appears to be stable at present. Continue 0.9 normal saline at 100 cc per hour. Continue to monitor renal function closely.   2. Hypertension.  Blood pressure 128/108. Previously the patient was on metoprolol and diltiazem. Consider adding back an ARB once renal function found to be stable.   3. Anemia of chronic kidney disease: Hemoglobin currently 10.6. No indication for Procrit at present.   4. Secondary Hyperparathyroidism: Continue to monitor bone mineral metabolism parameters as an outpatient.    LOS: 1 [place] 12/3/20169:47 AM

## 2015-07-18 NOTE — Progress Notes (Signed)
Patient complaining of pain when her foley catheter drains urine.  Repositioned foley and eventually removed, patient urinated a small amount after catheter removal, still complaining of pain.  MD notified and orders put in for pyridium.  Will continue to monitor.

## 2015-07-18 NOTE — Evaluation (Signed)
Clinical/Bedside Swallow Evaluation Patient Details  Name: Allison Shepard MRN: [id number] Date of Birth: 08/02/1934  Today's Date: 07/18/2015 Time: SLP Start Time (ACUTE ONLY): 0903 SLP Stop Time (ACUTE ONLY): 0954 SLP Time Calculation (min) (ACUTE ONLY): 51 min  Past Medical History:  Past Medical History  Diagnosis Date  . COPD (chronic obstructive pulmonary disease) (HCC)   . Atrial fibrillation (HCC)   . Hypertension   . Hypercholesteremia   . Pneumonia   . Diabetes mellitus without complication (HCC)     type 2  . Headache     migraines - years ago  . Complication of anesthesia     difficulty to wake up after an 8 hour back surgery   Past Surgical History:  Past Surgical History  Procedure Laterality Date  . Abdominal hysterectomy    . Shoulder fusion surgery Right   . Knee surgery Right     knee replacement  . Hip surgery Right     hip replacement  . Back surgery    . Eye surgery      cataract surgery ? eye  . Fracture surgery      right arm 20 years ago  . Colonoscopy      x 3  . Orif distal radius fracture Left 06/17/2015  . Open reduction internal fixation (orif) distal radial fracture Left 06/17/2015    Procedure: OPEN REDUCTION INTERNAL FIXATION LEFT  DISTAL RADIUS AND ULNA FRACTURES;  Surgeon: Tarry Kos, MD;  Location: [facility] OR;  Service: Orthopedics;  Laterality: Left;   HPI:  pt with a recent hx of pneumonia with recurrent hospital admission this time for same dx. Pna is noted to be in the upper left lobe.   Assessment / Plan / Recommendation Clinical Impression  pt presents with a WFL swallow response and no overt ssx aspiraiton during trials of solids and liquids. pt with functional dentition and mastication noted. pt wis unable to sit fully upright in bed due to body mass however is able to intake medications whole in thin and intake solids and liquids without incident. pt with watery eyes and runny nose prior to start of intake and note observed  to be due to intake of solids or liquids. pt with no coughing noted during intake, however did have a tissue from prior to sart of session with bloody phlem noted.   RN aware.  pt is recommended to remain on a regular with thin diet at this time.     Aspiration Risk  No limitations    Diet Recommendation Regular;Thin liquid   Liquid Administration via: Cup;Straw Medication Administration: Whole meds with liquid Supervision: Patient able to self feed Postural Changes: Seated upright at 90 degrees (as close to 90 degrees as possilbe)    Other  Recommendations Oral Care Recommendations: Patient independent with oral care   Follow up Recommendations  None    Frequency and Duration min 1 x/week  1 week       Prognosis Prognosis for Safe Diet Advancement: Good      Swallow Study   General Date of Onset: 07/17/15 HPI: pt with a recent hx of pneumonia with recurrent hospital admission this time for same dx. Pna is noted to be in the upper left lobe. Type of Study: Bedside Swallow Evaluation Previous Swallow Assessment: none known Diet Prior to this Study: Regular;Thin liquids Temperature Spikes Noted: No Respiratory Status: Nasal cannula History of Recent Intubation: No Behavior/Cognition: Alert;Cooperative;Pleasant mood Oral Care Completed by SLP: No Oral  [place] - Dentition: Adequate natural dentition Vision: Functional for self-feeding Self-Feeding Abilities: Able to feed self Patient Positioning: Upright in bed Baseline Vocal Quality: Normal Volitional Cough: Strong Volitional Swallow: Able to elicit    Oral/Motor/Sensory Function Overall Oral Motor/Sensory Function: Within functional limits   Ice Chips Ice chips: Within functional limits Presentation: Spoon;Self Fed   Thin Liquid Thin Liquid: Within functional limits Presentation: Cup;Straw;Self Fed    Nectar Thick Nectar Thick Liquid: Not tested   Honey Thick Honey Thick Liquid: Not tested   Puree Puree: Within  functional limits Presentation: Self Fed;Spoon   Solid Solid: Within functional limits Presentation: Self Fed;Spoon       Meredith PelStacie Harris Sauber 07/18/2015,10:16 AM

## 2015-07-18 NOTE — Consult Note (Signed)
[facility]  CARDIOLOGY CONSULT NOTE  Patient ID: Allison Shepard MRN: [id number] DOB/AGE: Feb 19, 1934 81 y.o.  Admit date: 07/17/2015 Referring Physician Dr. Amado Coe Primary Physician   Primary Cardiologist Dr. Lady Gary Reason for Consultation afib  HPI: Pt is a 81 yo female with history of intermitant afib who presented to the er with complaints of rapid heart rate, shortness of breath. She has been admitted in the past with similar complaints of afib aloong with an episode of pneumonia. Her rate is in control with iv amiodarone. CXR reveals probable left upper lobe pneumonia. Increased vr is likely secondary to her pneumonia. Stable at present  ROS Review of Systems - General ROS: positive for  - fatigue, fever and sob Respiratory ROS: positive for - cough and shortness of breath Cardiovascular ROS: positive for - irregular heartbeat Gastrointestinal ROS: no abdominal pain, change in bowel habits, or black or bloody stools Neurological ROS: no TIA or stroke symptoms   Past Medical History  Diagnosis Date  . COPD (chronic obstructive pulmonary disease) (HCC)   . Atrial fibrillation (HCC)   . Hypertension   . Hypercholesteremia   . Pneumonia   . Diabetes mellitus without complication (HCC)     type 2  . Headache     migraines - years ago  . Complication of anesthesia     difficulty to wake up after an 8 hour back surgery    Family History  Problem Relation Age of Onset  . Heart disease Father     Social History   Social History  . Marital Status: Married    Spouse Name: N/A  . Number of Children: N/A  . Years of Education: N/A   Occupational History  . Not on file.   Social History Main Topics  . Smoking status: Former Smoker    Quit date: 06/15/1992  . Smokeless tobacco: Never Used  . Alcohol Use: 8.4 oz/week    7 Glasses of wine, 7 Shots of liquor, 0 Standard drinks or equivalent per week     Comment: daily  . Drug Use: No   . Sexual Activity: Not on file   Other Topics Concern  . Not on file   Social History Narrative    Past Surgical History  Procedure Laterality Date  . Abdominal hysterectomy    . Shoulder fusion surgery Right   . Knee surgery Right     knee replacement  . Hip surgery Right     hip replacement  . Back surgery    . Eye surgery      cataract surgery ? eye  . Fracture surgery      right arm 20 years ago  . Colonoscopy      x 3  . Orif distal radius fracture Left 06/17/2015  . Open reduction internal fixation (orif) distal radial fracture Left 06/17/2015    Procedure: OPEN REDUCTION INTERNAL FIXATION LEFT  DISTAL RADIUS AND ULNA FRACTURES;  Surgeon: Tarry Kos, MD;  Location: [facility] OR;  Service: Orthopedics;  Laterality: Left;     Prescriptions prior to admission  Medication Sig Dispense Refill Last Dose  . albuterol (PROVENTIL HFA;VENTOLIN HFA) 108 (90 BASE) MCG/ACT inhaler Inhale 2 puffs into the lungs every 6 (six) hours as needed for wheezing or shortness of breath.   Past Week at Unknown time  . albuterol (PROVENTIL) (2.5 MG/3ML) 0.083% nebulizer solution Take 2.5 mg by nebulization every 6 (six) hours as needed for wheezing or shortness  of breath.   Past Week at Unknown time  . Cholecalciferol (VITAMIN D-3) 1000 UNITS CAPS Take 1,000 Units by mouth daily.   Past Week at Unknown time  . digoxin (LANOXIN) 0.125 MG tablet Take 1 tablet (0.125 mg total) by mouth daily. 30 tablet 0 Past Month at Unknown time  . diltiazem (TIAZAC) 180 MG 24 hr capsule Take 1 capsule (180 mg total) by mouth daily. 30 capsule 0 06/17/2015 at Unknown time  . donepezil (ARICEPT) 5 MG tablet Take 5 mg by mouth at bedtime.   06/16/2015 at Unknown time  . fluticasone (FLONASE) 50 MCG/ACT nasal spray Place 1 spray into both nostrils daily. 16 g 2 Past Month at Unknown time  . furosemide (LASIX) 40 MG tablet Take 40 mg by mouth daily.   06/16/2015 at Unknown time  . HYDROcodone-acetaminophen (NORCO) 10-325 MG  tablet Take 1-2 tablets by mouth every 6 (six) hours as needed. 60 tablet 0   . insulin aspart (NOVOLOG) 100 UNIT/ML injection Inject 8-12 Units into the skin 3 (three) times daily with meals. Below 200=8 units and every 50 units above she gets one extra unit up to 12 units   06/16/2015 at Unknown time  . insulin glargine (LANTUS) 100 UNIT/ML injection Inject 30 Units into the skin every morning.    06/16/2015 at Unknown time  . lisinopril (PRINIVIL,ZESTRIL) 5 MG tablet Take 5 mg by mouth daily.   06/16/2015 at Unknown time  . meloxicam (MOBIC) 7.5 MG tablet Take 1 tablet (7.5 mg total) by mouth 2 (two) times daily after a meal. (Patient not taking: Reported on 06/09/2015) 28 tablet 2 More than a month at Unknown time  . metoprolol succinate (TOPROL-XL) 100 MG 24 hr tablet Take 100 mg by mouth daily. Take with or immediately following a meal.   06/17/2015 at Unknown time  . nystatin (MYCOSTATIN) powder Apply 1 g topically 2 (two) times daily as needed.   06/17/2015 at Unknown time  . oxymetazoline (AFRIN) 0.05 % nasal spray Place 1 spray into both nostrils 2 (two) times daily as needed.    06/09/2015 at Unknown time  . rivaroxaban (XARELTO) 20 MG TABS tablet Take 20 mg by mouth daily.   Past Week at Unknown time  . simvastatin (ZOCOR) 10 MG tablet Take 10 mg by mouth daily.   06/16/2015 at Unknown time  . zolpidem (AMBIEN) 5 MG tablet Take 5 mg by mouth at bedtime as needed for sleep.   06/16/2015 at Unknown time    Physical Exam: Blood pressure 129/84, pulse 110, temperature 98.3 F (36.8 C), temperature source Oral, resp. rate 18, height 5\' 5"  (1.651 m), weight 109.3 kg (240 lb 15.4 oz), SpO2 97 %.    General appearance: alert and cooperative Resp: normal percussion bilaterally Cardio: irregularly irregular rhythm GI: soft, non-tender; bowel sounds normal; no masses,  no organomegaly Extremities: extremities normal, atraumatic, no cyanosis or edema Neurologic: Grossly normal Labs:   Lab Results   Component Value Date   WBC 11.6* 07/18/2015   HGB 10.6* 07/18/2015   HCT 32.4* 07/18/2015   MCV 98.8 07/18/2015   PLT 168 07/18/2015    Recent Labs Lab 07/17/15 1250 07/18/15 0428  NA 137 136  K 3.9 4.1  CL 104 106  CO2 26 26  BUN 22* 20  CREATININE 1.04* 1.13*  CALCIUM 8.2* 7.9*  PROT 6.0*  --   BILITOT 0.5  --   ALKPHOS 101  --   ALT 15  --  AST 15  --   GLUCOSE 144* 125*   Lab Results  Component Value Date   CKMB 3.3 02/10/2014   TROPONINI 0.03 07/17/2015      Radiology: probable upper lobe pneumonia EKG: afib with rvr  ASSESSMENT AND PLAN:  Pt iwht history of afib with intermitnat vr. Now admitted with pbobable pneumonia and resultant afib with rvr. The increased ventricualr response is likely secondary to increased stress with the air space disease. Would conitnue with iv amiodarone for now. Continue with digoxin and xarelto for anticoagulation.  Continue to treat pnemonia with emperic abx.  Signed: Dalia Heading MD, [place] 07/18/2015, 3:57 PM

## 2015-07-18 NOTE — Progress Notes (Signed)
[facility] [place] [facility]   PATIENT NAME: Allison Shepard    MR#:  [id number]  DATE OF BIRTH:  May 23, 1934  SUBJECTIVE:  CHIEF COMPLAINT:  Patient is tired and falling asleep while talking. She was unable to provide good history Reported she is doing fine  REVIEW OF SYSTEMS:  Patient is very lethargic  DRUG ALLERGIES:   Allergies  Allergen Reactions  . Hydromorphone Hcl Nausea And Vomiting  . Ibuprofen Other (See Comments)    Pt was told by her MD not to take this medication.    . Mucinex [Guaifenesin Er] Other (See Comments)    Red all over. Felt like i was on fire  . Percocet [Oxycodone-Acetaminophen] Hives    VITALS:  Blood pressure 114/89, pulse 102, temperature 98.3 F (36.8 C), temperature source Axillary, resp. rate 22, height 5\' 5"  (1.651 m), weight 109.3 kg (240 lb 15.4 oz), SpO2 88 %.  PHYSICAL EXAMINATION:  GENERAL:  81 y.o.-year-old patient lying in the bed with no acute distress.  EYES: Pupils equal, round, reactive to light and accommodation. No scleral icterus. Extraocular muscles intact.  HEENT: Head atraumatic, normocephalic. Oropharynx and nasopharynx clear.  NECK:  Supple, no jugular venous distention. No thyroid enlargement, no tenderness.  LUNGS: Moderate breath sounds bilaterally, no wheezing, rales,rhonchi or crepitation. No use of accessory muscles of respiration.  CARDIOVASCULAR: Irregularly irregular. Faint murmurs, no rubs, or gallops.  ABDOMEN: Soft, nontender, nondistended. Bowel sounds present. No organomegaly or mass.  EXTREMITIES: No pedal edema, cyanosis, or clubbing.  NEUROLOGIC: Cranial nerves II through XII are intact. Muscle strength 5/5 in all extremities. Sensation intact. Gait not checked.  PSYCHIATRIC: The patient is alert and oriented x 3.  SKIN: No obvious rash, lesion, or ulcer.    LABORATORY PANEL:   CBC  Recent Labs Lab 07/18/15 0428  WBC 11.6*  HGB 10.6*  HCT 32.4*  PLT 168    ------------------------------------------------------------------------------------------------------------------  Chemistries   Recent Labs Lab 07/17/15 1250 07/18/15 0428  NA 137 136  K 3.9 4.1  CL 104 106  CO2 26 26  GLUCOSE 144* 125*  BUN 22* 20  CREATININE 1.04* 1.13*  CALCIUM 8.2* 7.9*  MG 1.7  --   AST 15  --   ALT 15  --   ALKPHOS 101  --   BILITOT 0.5  --    ------------------------------------------------------------------------------------------------------------------  Cardiac Enzymes  Recent Labs Lab 07/17/15 1250  TROPONINI 0.03   ------------------------------------------------------------------------------------------------------------------  RADIOLOGY:  Dg Chest 1 View  07/17/2015  CLINICAL DATA:  Altered mental status. EXAM: CHEST 1 VIEW COMPARISON:  April 11, 2015. FINDINGS: Stable cardiomegaly. Right lung is clear. No pneumothorax or significant pleural effusion is noted. New airspace opacity is noted in left upper lobe most consistent with pneumonia. Bony thorax is unremarkable. IMPRESSION: Probable left upper lobe pneumonia. Continued radiographic followup is recommended to ensure resolution. Electronically Signed   By: James  Green Jr, M.D.   On: 07/17/2015 13:25   Ct Angio Chest Pe W/cm &/or Wo Cm  07/17/2015  CLINICAL DATA:  Dyspnea.  Atrial fibrillation. EXAM: CT ANGIOGRAPHY CHEST WITH CONTRAST TECHNIQUE: Multidetector CT imaging of the chest was performed using the standard protocol during bolus administration of intravenous contrast. Multiplanar CT image reconstructions and MIPs were obtained to evaluate the [id number] Han0 MG/ML SOLN COMPARISON:  04/06/2015 chest CT. Chest radiograph from earlier today. FINDINGS: Mediastinum/Nodes: The study is moderate quality for the evaluation of pulmonary embolism, with evaluation  of the subsegmental pulmonary arteries limited by motion artifact. There are no filling  defects in the central, lobar or segmental pulmonary artery branches to suggest acute pulmonary embolism. Great vessels are normal in course and caliber. Mild cardiomegaly . No pericardial fluid/thickening. Left anterior descending, left circumflex and right coronary atherosclerosis. Normal visualized thyroid. There is fluid and debris in the mid thoracic esophagus. No axillary adenopathy. Mildly enlarged 1.0 cm right paratracheal node (series 4/ image 47). Mildly enlarged 1.3 cm subcarinal node (4/62). Mildly enlarged 1.2 cm left infrahilar node (4/66) . Mildly enlarged 1.2 cm right infrahilar node (4/72). Lungs/Pleura: No pneumothorax. No pleural effusion. There is severe patchy consolidation and ground-glass opacity involving most of the left upper lobe including the lingula, with internal air bronchograms and no appreciable central airway stenosis. There is a subsolid 2.1 x 1.2 cm nodule in the posterior right lower lobe (series 6/ image 75), not definitely seen on the prior chest CT. Dependent density throughout both lungs likely represents passive atelectasis. Upper abdomen: Stable simple 6.5 cm renal cyst in the upper left kidney. Musculoskeletal: No aggressive appearing focal osseous lesions. Moderate degenerative changes in the thoracic spine. Partially visualized is posterior spinal fusion hardware in the upper lumbar spine. Review of the MIP images confirms the above findings. IMPRESSION: 1. No evidence of pulmonary embolism, with mild limitations as described. 2. Severe patchy consolidation and ground-glass opacity involving most of the left upper lobe, with internal air bronchograms. New subsolid nodule in the posterior right lower lobe. No appreciable central airway stenosis. These findings are favored to represent a multilobar infectious pneumonia, however an underlying neoplasm is not entirely excluded and a follow-up post treatment chest CT is advised in 6-8 weeks. 3. Mild mediastinal and bilateral  hilar lymphadenopathy, nonspecific. 4. Three-vessel coronary atherosclerosis. Electronically Signed   By: Delbert Phenix M.D.   On: 07/17/2015 16:08   US Venous Img Lower Bilateral  07/18/2015  CLINICAL DATA:  Left greater than right bilateral lower extremity swelling for 1 month. EXAM: BILATERAL LOWER EXTREMITY VENOUS DOPPLER ULTRASOUND TECHNIQUE: Gray-scale sonography with graded compression, as well as color Doppler and duplex ultrasound were performed to evaluate the lower extremity deep venous systems from the level of the common femoral vein and including the common femoral, femoral, profunda femoral, popliteal and calf veins including the posterior tibial, peroneal and gastrocnemius veins when visible. The superficial great saphenous vein was also interrogated. Spectral Doppler was utilized to evaluate flow at rest and with distal augmentation maneuvers in the common femoral, femoral and popliteal veins. COMPARISON:  04/07/2015 bilateral lower extremity venous Doppler scan. FINDINGS: RIGHT LOWER EXTREMITY Common Femoral Vein: No evidence of thrombus. Normal compressibility, respiratory phasicity and response to augmentation. Saphenofemoral Junction: No evidence of thrombus. Normal compressibility and flow on color Doppler imaging. Profunda Femoral Vein: No evidence of thrombus. Normal compressibility and flow on color Doppler imaging. Femoral Vein: No evidence of thrombus. Normal compressibility, respiratory phasicity and response to augmentation. Popliteal Vein: No evidence of thrombus. Normal compressibility, respiratory phasicity and response to augmentation. Calf Veins: No evidence of thrombus. Normal compressibility and flow on color Doppler imaging. Right peroneal veins could not be visualized, possibly due to technical limitations. Superficial Great Saphenous Vein: No evidence of thrombus. Normal compressibility and flow on color Doppler imaging. Venous Reflux:  None. Other Findings:  None. LEFT LOWER  EXTREMITY Common Femoral Vein: No evidence of thrombus. Normal compressibility, respiratory phasicity and response to augmentation. Saphenofemoral Junction: No evidence of thrombus. Normal compressibility and  flow on color Doppler imaging. Profunda Femoral Vein: No evidence of thrombus. Normal compressibility and flow on color Doppler imaging. Femoral Vein: No evidence of thrombus. Normal compressibility, respiratory phasicity and response to augmentation. Popliteal Vein: No evidence of thrombus. Normal compressibility, respiratory phasicity and response to augmentation. Calf Veins: No evidence of thrombus. Normal compressibility and flow on color Doppler imaging. Superficial Great Saphenous Vein: No evidence of thrombus. Normal compressibility and flow on color Doppler imaging. Venous Reflux:  None. Other Findings: There is a 5.7 x 0.9 x 2.7 cm cystic structure in the left popliteal fossa without internal vascularity on Doppler, which measured 3.2 x 1.4 x 2.4 cm on 04/11/2015 and likely represents a Baker's cyst. IMPRESSION: 1. No evidence of deep venous thrombosis in either lower extremity. 2. 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 Electronically Signed   By: Delbert Phenix M.D.   On: 07/18/2015 12:57    EKG:   Orders placed or performed during the hospital encounter of 07/17/15  . ED EKG  . ED EKG    ASSESSMENT AND PLAN:   1. Sepsis. Secondary to multilobar pneumonia Follow-up on blood cultures and sputum cultures if collected,   continue on levofloxacin, vancomycin and Zosyn for healthcare associated pneumonia Check a.m. Labs CT chest is negative for pulmonary embolism  2. Acute respiratory failure with hypoxia.  Continue oxygen therapy as needed to maintain pulse ox greater than 88%    appreciate pulmonologist recommendations Nebulizer treatments will be provided as needed basis   3. Left upper lobe pneumonia Continue patient on broad-spectrum antibiotic  therapy Follow-up on sputum culture and sensitivity   4. Hemoptysis,  will probably from pneumonia  Resume  Anticoagulants Xarelto   CT angiogram neg for  pulmonary embolism  5. A. fib, RVR,  Rate controlled with amiodarone IV drip Continue Cardizem and digoxin Cardiac consult is placed  6. Hypotension, possibly  secondary to sepsis and secondary to rate controlling drugs for A. fib, RVR  Improved  7.Acute  On ch renal insufficiency. Secondary to IV contrast used for the CT angiogram of the chest Provided gentle hydration with IV fluids  8. Metabolic encephalopathy.  Improving, continue Supportive therapy  9. Recent history of left wrist fracture status post cast Continue pain management   consult orthopedics for follow-up      All the records are reviewed and case discussed with Care Management/Social Workerr. Management plans discussed with the patient, family and they are in agreement.  CODE STATUS: FC   TOTAL CRITICAL CARE TIME TAKING CARE OF THIS PATIENT: 35 minutes.   POSSIBLE D/C IN 2-3 DAYS, DEPENDING ON CLINICAL CONDITION.   Ramonita Lab M.D on 07/18/2015 at 2:39 PM  Between 7am to 6pm - Pager - [phone] After 6pm go to [email or web address] - [email or web address] [facility]  [facility]  Office  [phone]  CC: Primary care physician; Mickey Farber, MD

## 2015-07-18 NOTE — Consult Note (Signed)
PULMONARY / CRITICAL CARE MEDICINE   Name: Allison Shepard MRN: [id number] DOB: February 04, 1934    ADMISSION DATE:  07/17/2015 CONSULTATION DATE:  07/17/15  REFERRING MD :  Dr. Winona Legato   CHIEF COMPLAINT:     Altered mental status and hemoptysis   HISTORY OF PRESENT ILLNESS    81 y.o. female with a known history of multiple medical problems including atrial fibrillation, chronic hypertension, hyperlipidemia, obesity diabetes, recent admission for pneumonia in Aug 2016 x 2, presents back to the hospital with complaints of worsening confusion, mild hemoptysis, sob, and suspected LUL PNA.  She was coughing up phlegm with some blood. Patient's daughter was called and patient was brought to emergency room for further evaluation. In emergency room, she was noted to be in A. fib, RVR, heart rate about 150 intermittently. She was given a few doses of Cardizem as well as metoprolol with no significant improvement in her A. fib, RVR, but just dropping her blood pressure. Patient's chest x-ray revealed left upper lobe pneumonia. Labs showed mild renal insufficiency as well as significant leukocytosis.   SUBJECTIVE: Patient with great critical improvement this morning, no significant sputum production, alert and mentating 3. -State that she is feeling much better this morning. -Blood culture noted with gram-positive cocci, restarted on vancomycin SIGNIFICANT EVENTS   03/16/15 - hospitalized for AECOPD, ?CHF, Afib 04/06/15 - hospitalized for Sepsis (GBS bacteremia), afib with RVR   PAST MEDICAL HISTORY    :  Past Medical History  Diagnosis Date  . COPD (chronic obstructive pulmonary disease) (HCC)   . Atrial fibrillation (HCC)   . Hypertension   . Hypercholesteremia   . Pneumonia   . Diabetes mellitus without complication (HCC)     type 2  . Headache     migraines - years ago  . Complication of anesthesia     difficulty to wake up after an 8 hour back surgery   Past Surgical History   Procedure Laterality Date  . Abdominal hysterectomy    . Shoulder fusion surgery Right   . Knee surgery Right     knee replacement  . Hip surgery Right     hip replacement  . Back surgery    . Eye surgery      cataract surgery ? eye  . Fracture surgery      right arm 20 years ago  . Colonoscopy      x 3  . Orif distal radius fracture Left 06/17/2015  . Open reduction internal fixation (orif) distal radial fracture Left 06/17/2015    Procedure: OPEN REDUCTION INTERNAL FIXATION LEFT  DISTAL RADIUS AND ULNA FRACTURES;  Surgeon: Tarry Kos, MD;  Location: [facility] OR;  Service: Orthopedics;  Laterality: Left;   Prior to Admission medications   Medication Sig Start Date End Date Taking? Authorizing Provider  albuterol (PROVENTIL HFA;VENTOLIN HFA) 108 (90 BASE) MCG/ACT inhaler Inhale 2 puffs into the lungs every 6 (six) hours as needed for wheezing or shortness of breath.    Historical Provider, MD  albuterol (PROVENTIL) (2.5 MG/3ML) 0.083% nebulizer solution Take 2.5 mg by nebulization every 6 (six) hours as needed for wheezing or shortness of breath.    Historical Provider, MD  Cholecalciferol (VITAMIN D-3) 1000 UNITS CAPS Take 1,000 Units by mouth daily.    Historical Provider, MD  digoxin (LANOXIN) 0.125 MG tablet Take 1 tablet (0.125 mg total) by mouth daily. 04/14/15   Adrian Saran, MD  diltiazem (TIAZAC) 180 MG 24 hr capsule Take 1 capsule (  180 mg total) by mouth daily. 04/14/15   Sital Mody, MD  donepezil (ARICEPT) 5 MG tablet Take 5 mg by mouth at bedtime.    Historical Provider, MD  fluticasone (FLONASE) 50 MCG/ACT nasal spray Place 1 spray into both nostrils daily. 03/20/15   Auburn Bilberry, MD  furosemide (LASIX) 40 MG tablet Take 40 mg by mouth daily.    Historical Provider, MD  HYDROcodone-acetaminophen (NORCO) 10-325 MG tablet Take 1-2 tablets by mouth every 6 (six) hours as needed. 06/17/15   Naiping Donnelly Stager, MD  insulin aspart (NOVOLOG) 100 UNIT/ML injection Inject 8-12 Units into the skin  3 (three) times daily with meals. Below 200=8 units and every 50 units above she gets one extra unit up to 12 units    Historical Provider, MD  insulin glargine (LANTUS) 100 UNIT/ML injection Inject 30 Units into the skin every morning.     Historical Provider, MD  lisinopril (PRINIVIL,ZESTRIL) 5 MG tablet Take 5 mg by mouth daily.    Historical Provider, MD  meloxicam (MOBIC) 7.5 MG tablet Take 1 tablet (7.5 mg total) by mouth 2 (two) times daily after a meal. Patient not taking: Reported on 06/09/2015 03/11/15   Tod Persia, MD  metoprolol succinate (TOPROL-XL) 100 MG 24 hr tablet Take 100 mg by mouth daily. Take with or immediately following a meal.    Historical Provider, MD  nystatin (MYCOSTATIN) powder Apply 1 g topically 2 (two) times daily as needed.    Historical Provider, MD  oxymetazoline (AFRIN) 0.05 % nasal spray Place 1 spray into both nostrils 2 (two) times daily.    Historical Provider, MD  oxymetazoline (VICKS SINEX) 0.05 % nasal spray Place 1 spray into both nostrils 2 (two) times daily.    Historical Provider, MD  rivaroxaban (XARELTO) 20 MG TABS tablet Take 20 mg by mouth daily.    Historical Provider, MD  simvastatin (ZOCOR) 10 MG tablet Take 10 mg by mouth daily.    Historical Provider, MD  zolpidem (AMBIEN) 5 MG tablet Take 5 mg by mouth at bedtime as needed for sleep.    Historical Provider, MD   Allergies  Allergen Reactions  . Hydromorphone Hcl Nausea And Vomiting  . Ibuprofen Other (See Comments)    Pt was told by her MD not to take this medication.    . Mucinex [Guaifenesin Er] Other (See Comments)    Red all over. Felt like i was on fire  . Percocet [Oxycodone-Acetaminophen] Hives     FAMILY HISTORY   Family History  Problem Relation Age of Onset  . Heart disease Father       SOCIAL HISTORY    reports that she quit smoking about 23 years ago. She has never used smokeless tobacco. She reports that she drinks about 8.4 oz of alcohol per week. She  reports that she does not use illicit drugs.  Review of Systems  Constitutional: Negative for fever and chills.  Eyes: Negative for blurred vision, double vision and photophobia.  Respiratory: Positive for cough, sputum production, shortness of breath and wheezing. Negative for hemoptysis.        Improving shortness of breath  Cardiovascular: Positive for chest pain.  Gastrointestinal: Negative for nausea, vomiting, abdominal pain and diarrhea.  Genitourinary: Negative for dysuria and urgency.  Musculoskeletal: Negative for myalgias.  Skin: Positive for rash.  Neurological: Negative for dizziness and loss of consciousness.  Endo/Heme/Allergies: Does not bruise/bleed easily.  Psychiatric/Behavioral: Positive for hallucinations.      VITAL SIGNS  Temp:  [97.7 F (36.5 C)-99.9 F (37.7 C)] 97.7 F (36.5 C) (12/03 0728) Pulse Rate:  [53-160] 71 (12/03 0900) Resp:  [16-34] 29 (12/03 0900) BP: (66-135)/(38-108) 128/108 mmHg (12/03 0900) SpO2:  [86 %-100 %] 86 % (12/03 0900) FiO2 (%):  [36 %] 36 % (12/02 1729) Weight:  [220 lb (99.791 kg)-240 lb 15.4 oz (109.3 kg)] 240 lb 15.4 oz (109.3 kg) (12/02 1729) HEMODYNAMICS:   VENTILATOR SETTINGS: Vent Mode:  [-]  FiO2 (%):  [36 %] 36 % INTAKE / OUTPUT:  Intake/Output Summary (Last 24 hours) at 07/18/15 1045 Last data filed at 07/18/15 0926  Gross per 24 hour  Intake 1886.57 ml  Output   1450 ml  Net 436.57 ml       PHYSICAL EXAM   Physical Exam  Constitutional: She appears well-developed and well-nourished.  HENT:  Head: Normocephalic and atraumatic.  Right Ear: External ear normal.  Left Ear: External ear normal.  Mouth/Throat: Oropharynx is clear and moist.  Eyes: Pupils are equal, round, and reactive to light.  Neck: Normal range of motion. Neck supple. No thyromegaly present.  Cardiovascular: Intact distal pulses.  Exam reveals no gallop and no friction rub.   No murmur heard. Tachycardia with irregular beat   Pulmonary/Chest: She has wheezes. She has rales.  Shallow BS. Improving Dec BS on the LUL Mild faint wheezes  Abdominal: Soft. Bowel sounds are normal. She exhibits no distension. There is no tenderness. There is no rebound.  Musculoskeletal: Normal range of motion. She exhibits tenderness.  Neurological: She is alert.  Awake but confused. Orient to place only.   Skin: Skin is warm and dry.  +2 LE B/L Edema Venous stasis in B/L   Nursing note and vitals reviewed.      LABS   LABS:  CBC  Recent Labs Lab 07/17/15 1158 07/18/15 0428  WBC 18.4* 11.6*  HGB 13.8 10.6*  HCT 42.7 32.4*  PLT 250 168   Coag's  Recent Labs Lab 07/17/15 1158  INR 2.24   BMET  Recent Labs Lab 07/17/15 1250 07/18/15 0428  NA 137 136  K 3.9 4.1  CL 104 106  CO2 26 26  BUN 22* 20  CREATININE 1.04* 1.13*  GLUCOSE 144* 125*   Electrolytes  Recent Labs Lab 07/17/15 1250 07/18/15 0428  CALCIUM 8.2* 7.9*  MG 1.7  --    Sepsis Markers  Recent Labs Lab 07/17/15 1250 07/17/15 1615 07/18/15 0428  LATICACIDVEN 1.8 1.9  --   PROCALCITON 0.14  --  1.91   ABG No results for input(s): PHART, PCO2ART, PO2ART in the last 168 hours. Liver Enzymes  Recent Labs Lab 07/17/15 1250  AST 15  ALT 15  ALKPHOS 101  BILITOT 0.5  ALBUMIN 3.1*   Cardiac Enzymes  Recent Labs Lab 07/17/15 1250  TROPONINI 0.03   Glucose  Recent Labs Lab 07/17/15 1734 07/18/15 0811  GLUCAP 150* 122*     Recent Results (from the past 240 hour(s))  Blood culture (routine x 2)     Status: None (Preliminary result)   Collection Time: 07/17/15 11:58 AM  Result Value Ref Range Status   Specimen Description BLOOD RIGHT ASSIST CONTROL  Final   Special Requests BAA,5ML,AER,ANA  Final   Culture NO GROWTH < 24 HOURS  Final   Report Status PENDING  Incomplete  Blood culture (routine x 2)     Status: None (Preliminary result)   Collection Time: 07/17/15 11:58 AM  Result Value Ref Range Status  Specimen Description BLOOD LEFT ASSIST CONTROL  Final   Special Requests BAA,5ML,ANA,AER  Final   Culture  Setup Time   Final    GRAM POSITIVE COCCI AEROBIC BOTTLE ONLY CRITICAL RESULT CALLED TO, READ BACK BY AND VERIFIED WITH: MICHELLE WILLIAMS AT [id number] ON 07/18/15 [facility] CONFIRMED BY [facility]    Culture   Final    GRAM POSITIVE COCCI AEROBIC BOTTLE ONLY IDENTIFICATION TO FOLLOW    Report Status PENDING  Incomplete  MRSA PCR Screening     Status: None   Collection Time: 07/17/15  6:03 PM  Result Value Ref Range Status   MRSA by PCR NEGATIVE NEGATIVE Final    Comment:        The GeneXpert MRSA Assay (FDA approved for NASAL specimens only), is one component of a comprehensive MRSA colonization surveillance program. It is not intended to diagnose MRSA infection nor to guide or monitor treatment for MRSA infections.      Current facility-administered medications:  .  0.9 % NaCl with KCl 20 mEq/ L  infusion, , Intravenous, Continuous, Katharina Caper, MD, Last Rate: 100 mL/hr at 07/17/15 1610 .  acetaminophen (TYLENOL) tablet 650 mg, 650 mg, Oral, Q6H PRN **OR** acetaminophen (TYLENOL) suppository 650 mg, 650 mg, Rectal, Q6H PRN, Katharina Caper, MD .  [address] amiodarone (NEXTERONE) 1.8 mg/mL load via infusion 150 mg, 150 mg, Intravenous, Once, 150 mg at 07/17/15 1604 **FOLLOWED BY** [EXPIRED] amiodarone (NEXTERONE PREMIX) 360 MG/200ML (1.8 mg/mL) IV infusion, 60 mg/hr, Intravenous, Continuous, Stopped at 07/17/15 2130 **FOLLOWED BY** amiodarone (NEXTERONE PREMIX) 360 MG/200ML (1.8 mg/mL) IV infusion, 30 mg/hr, Intravenous, Continuous, Katharina Caper, MD, Last Rate: 16.7 mL/hr at 07/17/15 2140, 30 mg/hr at 07/17/15 2140 .  antiseptic oral rinse (CPC / CETYLPYRIDINIUM CHLORIDE 0.05%) solution 7 mL, 7 mL, Mouth Rinse, BID, Katharina Caper, MD, 7 mL at 07/18/15 0803 .  digoxin (LANOXIN) tablet 0.125 mg, 0.125 mg, Oral, Daily, Katharina Caper, MD, 0.125 mg at 07/18/15 0758 .  donepezil (ARICEPT)  tablet 5 mg, 5 mg, Oral, QHS, Katharina Caper, MD, 5 mg at 07/17/15 2129 .  fluticasone (FLONASE) 50 MCG/ACT nasal spray 1 spray, 1 spray, Each Nare, Daily, Katharina Caper, MD, 1 spray at 07/18/15 0759 .  furosemide (LASIX) tablet 40 mg, 40 mg, Oral, Daily, Katharina Caper, MD, 40 mg at 07/18/15 0757 .  insulin aspart (novoLOG) injection 0-9 Units, 0-9 Units, Subcutaneous, TID WC, Catheryn Slifer, MD .  insulin glargine (LANTUS) injection 30 Units, 30 Units, Subcutaneous, q morning - 10a, Katharina Caper, MD, 30 Units at 07/18/15 0917 .  levalbuterol (XOPENEX) nebulizer solution 1.25 mg, 1.25 mg, Nebulization, Q4H PRN, Zyionna Pesce, MD .  levalbuterol (XOPENEX) nebulizer solution 1.25 mg, 1.25 mg, Nebulization, QID, Katharina Caper, MD, 1.25 mg at 07/18/15 0821 .  levofloxacin (LEVAQUIN) IVPB 750 mg, 750 mg, Intravenous, Q24H, Avalina Benko, MD .  nystatin (MYCOSTATIN) powder 1 g, 1 g, Topical, BID, Katharina Caper, MD, 1 g at 07/18/15 0800 .  ondansetron (ZOFRAN) tablet 4 mg, 4 mg, Oral, Q6H PRN **OR** ondansetron (ZOFRAN) injection 4 mg, 4 mg, Intravenous, Q6H PRN, Katharina Caper, MD .  oxymetazoline (AFRIN) 0.05 % nasal spray 1 spray, 1 spray, Each Nare, BID PRN, Katharina Caper, MD .  piperacillin-tazobactam (ZOSYN) IVPB 3.375 g, 3.375 g, Intravenous, 3 times per day, Stephanie Acre, MD, 3.375 g at 07/18/15 0803 .  [START ON 07/19/2015] pneumococcal 23 valent vaccine (PNU-IMMUNE) injection 0.5 mL, 0.5 mL, Intramuscular, Tomorrow-1000, Aruna Gouru, MD .  simvastatin (ZOCOR) tablet 10 mg,  10 mg, Oral, Daily, Katharina Caperima Vaickute, MD, 10 mg at 07/18/15 0757 .  sodium chloride 0.9 % injection 3 mL, 3 mL, Intravenous, Q12H, Katharina Caperima Vaickute, MD, 3 mL at 07/18/15 0804 .  vancomycin (VANCOCIN) 1,250 mg in sodium chloride 0.9 % 250 mL IVPB, 1,250 mg, Intravenous, Q18H, Karl ItoSteven E Sommer, MD  IMAGING    Dg Chest 1 View  07/17/2015  CLINICAL DATA:  Altered mental status. EXAM: CHEST 1 VIEW COMPARISON:  April 11, 2015. FINDINGS:  Stable cardiomegaly. Right lung is clear. No pneumothorax or significant pleural effusion is noted. New airspace opacity is noted in left upper lobe most consistent with pneumonia. Bony thorax is unremarkable. IMPRESSION: Probable left upper lobe pneumonia. Continued radiographic followup is recommended to ensure resolution. Electronically Signed   By: Lupita RaiderJames  Green Jr, M.D.   On: 07/17/2015 13:25   Ct Angio Chest Pe W/cm &/or Wo Cm  07/17/2015  CLINICAL DATA:  Dyspnea.  Atrial fibrillation. EXAM: CT ANGIOGRAPHY CHEST WITH CONTRAST TECHNIQUE: Multidetector CT imaging of the chest was performed using the standard protocol during bolus administration of intravenous contrast. Multiplanar CT image reconstructions and MIPs were obtained to evaluate the vascular anatomy. CONTRAST:  80mL OMNIPAQUE IOHEXOL 350 MG/ML SOLN COMPARISON:  04/06/2015 chest CT. Chest radiograph from earlier today. FINDINGS: Mediastinum/Nodes: The study is moderate quality for the evaluation of pulmonary embolism, with evaluation of the subsegmental pulmonary arteries limited by motion artifact. There are no filling defects in the central, lobar or segmental pulmonary artery branches to suggest acute pulmonary embolism. Great vessels are normal in course and caliber. Mild cardiomegaly . No pericardial fluid/thickening. Left anterior descending, left circumflex and right coronary atherosclerosis. Normal visualized thyroid. There is fluid and debris in the mid thoracic esophagus. No axillary adenopathy. Mildly enlarged 1.0 cm right paratracheal node (series 4/ image 47). Mildly enlarged 1.3 cm subcarinal node (4/62). Mildly enlarged 1.2 cm left infrahilar node (4/66) . Mildly enlarged 1.2 cm right infrahilar node (4/72). Lungs/Pleura: No pneumothorax. No pleural effusion. There is severe patchy consolidation and ground-glass opacity involving most of the left upper lobe including the lingula, with internal air bronchograms and no appreciable  central airway stenosis. There is a subsolid 2.1 x 1.2 cm nodule in the posterior right lower lobe (series 6/ image 75), not definitely seen on the prior chest CT. Dependent density throughout both lungs likely represents passive atelectasis. Upper abdomen: Stable simple 6.5 cm renal cyst in the upper left kidney. Musculoskeletal: No aggressive appearing focal osseous lesions. Moderate degenerative changes in the thoracic spine. Partially visualized is posterior spinal fusion hardware in the upper lumbar spine. Review of the MIP images confirms the above findings. IMPRESSION: 1. No evidence of pulmonary embolism, with mild limitations as described. 2. Severe patchy consolidation and ground-glass opacity involving most of the left upper lobe, with internal air bronchograms. New subsolid nodule in the posterior right lower lobe. No appreciable central airway stenosis. These findings are favored to represent a multilobar infectious pneumonia, however an underlying neoplasm is not entirely excluded and a follow-up post treatment chest CT is advised in 6-8 weeks. 3. Mild mediastinal and bilateral hilar lymphadenopathy, nonspecific. 4. Three-vessel coronary atherosclerosis. Electronically Signed   By: Delbert PhenixJason A Poff M.D.   On: 07/17/2015 16:08      Indwelling Urinary Catheter continued, requirement due to   Reason to continue Indwelling Urinary Catheter for strict Intake/Output monitoring for hemodynamic instability   Central Line continued, requirement due to   Reason to continue Gap IncCentral Line Monitoring of  central venous pressure or other hemodynamic parameters   Ventilator continued, requirement due to, resp failure    Ventilator Sedation RASS 0 to -2   Cultures: BCx2 12/2>>GPC UC  Sputum 12/2>>  Antibiotics: Levaquin 12/2>> Zosyn 12/2>> Vanc 12/2>>  Lines:   ASSESSMENT/PLAN   81 year old female past medical history of COPD, atrial fibrillation with RVR, group B strep bacteremia 03/2015,  had omitted for multifocal pneumonia with hypoxia, mainly in the left upper lobe.  Sepsis - due to multilobar pneumonia - LUL -now a great clinical improvement - cont with levaquin and zosyn, treating HCAP, vanc started lastnight - monitor cbc - maintain MAP>65 - IVF as tolerated - monitor UOP - respiratory and blood cultures - CT chest with no PE, but significant LUL PNA - stable to transfer to medical floor  Acute respiratory failure with hypoxia.  - maintain O2 sats >88% - wears up 2L [place] at home - may use HFNC if needed - scheduled xopenox for the next 3 days - ICS  Left upper lobe pneumonia - currently started on IV abx - f/u resp and bld cx  Hemoptysis - CTA neg for PE - secondary to LUL PNA - ok to cont with anticoagulation for afib - monitor Plts and H\H  A. Fib with RVR - most likely 2/2 LUL PNA and sepsis - still with afib in the 110s, but with stable BP - cont with  amiodarone IV drip after bolus - cont with dig and cardizem as BP tolerates  Hypotension - 2/2 to sepsis and afib with RVR - now resolving - hold beta blockers - cont with IVFs, gentle - IV Abx - monitor UOP  Renal insufficiency - chronic - will f/u Cr and GFR - renal following  I have personally obtained a history, examined the patient, evaluated laboratory and imaging results, formulated the assessment and plan and placed orders.  Pulmonary Consult time -  Stephanie Acre, MD [facility] Pager [phone] (please enter 7-digits) On Call Pager - [phone] (please enter 7-digits)   07/18/2015, 10:45 AM  Note: This note was prepared with Dragon dictation along with smaller phrase technology. Any transcriptional errors that result from this process are unintentional.

## 2015-07-18 NOTE — Progress Notes (Signed)
eLink Physician-Brief Progress Note Patient Name: Allison HeadsMargaret M Shepard DOB: 27-Jul-1934 MRN: [id number]   Date of Service  07/18/2015  HPI/Events of Note  Blood Culture is positive by verbal report from the lab for GPC's. Patient is currently on Zosyn and Levaquin.  eICU Interventions  Will order Vancomycin per Pharmacy consultation pending full culture organism ID and sensitivities.      Intervention Category Major Interventions: Infection - evaluation and management  [name] 07/18/2015, 3:57 AM

## 2015-07-18 NOTE — Consult Note (Signed)
ANTIBIOTIC CONSULT NOTE - INITIAL  Pharmacy Consult for Levofloxacin, Zosyn, and vancomycin Indication: pneumonia/UTI/bacteremia  Allergies  Allergen Reactions  . Hydromorphone Hcl Nausea And Vomiting  . Ibuprofen Other (See Comments)    Pt was told by her MD not to take this medication.    . Mucinex [Guaifenesin Er] Other (See Comments)    Red all over. Felt like i was on fire  . Percocet [Oxycodone-Acetaminophen] Hives   Patient Measurements: Height: 5\' 5"  (165.1 cm) Weight: 240 lb 15.4 oz (109.3 kg) IBW/kg (Calculated) : 57  Vital Signs: Temp: 98.4 F (36.9 C) (12/02 2000) Temp Source: Oral (12/02 2000) BP: 111/78 mmHg (12/02 2000) Pulse Rate: 116 (12/02 2000)  Recent Labs  07/17/15 1158 07/17/15 1250  WBC 18.4*  --   HGB 13.8  --   PLT 250  --   CREATININE  --  1.04*   Estimated Creatinine Clearance: 53.9 mL/min (by C-G formula based on Cr of 1.04).   Microbiology: Recent Results (from the past 720 hour(s))  Blood culture (routine x 2)     Status: None (Preliminary result)   Collection Time: 07/17/15 11:58 AM  Result Value Ref Range Status   Specimen Description BLOOD LEFT ASSIST CONTROL  Final   Special Requests BAA,5ML,ANA,AER  Final   Culture  Setup Time   Final    GRAM POSITIVE COCCI AEROBIC BOTTLE ONLY CRITICAL RESULT CALLED TO, READ BACK BY AND VERIFIED WITH: MICHELLE WILLIAMS AT [id number] ON 07/18/15 [facility] CONFIRMED BY [facility]    Culture   Final    GRAM POSITIVE COCCI AEROBIC BOTTLE ONLY IDENTIFICATION TO FOLLOW    Report Status PENDING  Incomplete  MRSA PCR Screening     Status: None   Collection Time: 07/17/15  6:03 PM  Result Value Ref Range Status   MRSA by PCR NEGATIVE NEGATIVE Final    Comment:        The GeneXpert MRSA Assay (FDA approved for NASAL specimens only), is one component of a comprehensive MRSA colonization surveillance program. It is not intended to diagnose MRSA infection nor to guide or monitor treatment for MRSA infections.      Medical History: Past Medical History  Diagnosis Date  . COPD (chronic obstructive pulmonary disease) (HCC)   . Atrial fibrillation (HCC)   . Hypertension   . Hypercholesteremia   . Pneumonia   . Diabetes mellitus without complication (HCC)     type 2  . Headache     migraines - years ago  . Complication of anesthesia     difficulty to wake up after an 8 hour back surgery   Assessment: 81 yo female presenting to ED with A.Fib, worsening confusion, and  Leukocytosis. Chest X-ray shows LUL PNA and UA is nitrite positive with many bacteria.  Pharmacy was verbally consulted for Zosyn and Levofloxacin dosing per ICU MD. Vancomycin consult was verbally D'cd by Dr. Dema SeverinMungal. MD stated that patient has bilateral leg swelling that is suspicious for cellulitis as well.  He is aware of double coverage at this time, but patient has had recent hospital visits.   Blood cx: GPC x1   CrCl: 51.3   Scr: 1.04    Plan:  Will start patient on levofloxacin 750mg  daily and Zosyn 3.375 EI q8 hours.  Pharmacy will continue to monitor patients renal function and labs and make adjustments as needed.    Vancomycin TBW 109.3kg  IBW 57kg  DW 78kg  Vd 55L kei 0.049 hr-1  T1/2 14 hours  1250 mg IV q 18 hours ordered with stacked dosing. Level before 5th dose.   Fulton Reek, PharmD, BCPS  07/18/2015

## 2015-07-18 NOTE — Progress Notes (Signed)
Patient's husband states he was unsure if she had received the pneumonia shot this year. The patient and her husband remember her receiving a shot in August of this year, but are unsure whether it was the flu or pneumonia shot. I called Pharmacy and they were able to look at her records and confirm that she received the flu shot in august of this year not the pneumonia shot. Patient denies having ever received pneumonia shot at any point prior to her admission in August. Will order pneumonia vaccine per protocol.

## 2015-07-19 DIAGNOSIS — A419 Sepsis, unspecified organism: Secondary | ICD-10-CM | POA: Diagnosis not present

## 2015-07-19 LAB — COMPREHENSIVE METABOLIC PANEL
ALBUMIN: 2.6 g/dL — AB (ref 3.5–5.0)
ALK PHOS: 80 U/L (ref 38–126)
ALT: 12 U/L — ABNORMAL LOW (ref 14–54)
ANION GAP: 3 — AB (ref 5–15)
AST: 12 U/L — ABNORMAL LOW (ref 15–41)
BUN: 21 mg/dL — ABNORMAL HIGH (ref 6–20)
CHLORIDE: 107 mmol/L (ref 101–111)
CO2: 26 mmol/L (ref 22–32)
Calcium: 8.1 mg/dL — ABNORMAL LOW (ref 8.9–10.3)
Creatinine, Ser: 1.18 mg/dL — ABNORMAL HIGH (ref 0.44–1.00)
GFR calc non Af Amer: 43 mL/min — ABNORMAL LOW (ref >60)
GFR, EST AFRICAN AMERICAN: 49 mL/min — AB (ref >60)
GLUCOSE: 156 mg/dL — AB (ref 65–99)
POTASSIUM: 4.3 mmol/L (ref 3.5–5.1)
SODIUM: 136 mmol/L (ref 135–145)
Total Bilirubin: 0.7 mg/dL (ref 0.3–1.2)
Total Protein: 5.7 g/dL — ABNORMAL LOW (ref 6.5–8.1)

## 2015-07-19 LAB — MAGNESIUM: Magnesium: 1.8 mg/dL (ref 1.7–2.4)

## 2015-07-19 LAB — CBC
HCT: 32.2 % — ABNORMAL LOW (ref 35.0–47.0)
HEMOGLOBIN: 10.7 g/dL — AB (ref 12.0–16.0)
MCH: 32.6 pg (ref 26.0–34.0)
MCHC: 33.1 g/dL (ref 32.0–36.0)
MCV: 98.3 fL (ref 80.0–100.0)
PLATELETS: 173 10*3/uL (ref 150–440)
RBC: 3.27 MIL/uL — ABNORMAL LOW (ref 3.80–5.20)
RDW: 15.6 % — AB (ref 11.5–14.5)
WBC: 7.5 10*3/uL (ref 3.6–11.0)

## 2015-07-19 LAB — GLUCOSE, CAPILLARY
GLUCOSE-CAPILLARY: 156 mg/dL — AB (ref 65–99)
GLUCOSE-CAPILLARY: 199 mg/dL — AB (ref 65–99)
GLUCOSE-CAPILLARY: 163 mg/dL — AB (ref 65–99)
Glucose-Capillary: 172 mg/dL — ABNORMAL HIGH (ref 65–99)

## 2015-07-19 LAB — PROCALCITONIN: PROCALCITONIN: 1.06 ng/mL

## 2015-07-19 MED ORDER — RIVAROXABAN 15 MG PO TABS: 15.0000 mg | ORAL_TABLET | Freq: Every day | ORAL | Status: DC

## 2015-07-19 MED ORDER — LEVOFLOXACIN IN D5W 750 MG/150ML IV SOLN
750.0000 mg | INTRAVENOUS | Status: DC
  Filled 2015-07-19: qty 150

## 2015-07-19 MED ORDER — RIVAROXABAN 20 MG PO TABS
20.0000 mg | ORAL_TABLET | Freq: Every day | ORAL | Status: DC
  Administered 2015-07-19 – 2015-07-20 (×2): 20 mg via ORAL
  Filled 2015-07-19: qty 1
  Filled 2015-07-19: qty 2

## 2015-07-19 MED ORDER — AMIODARONE HCL 200 MG PO TABS
400.0000 mg | ORAL_TABLET | Freq: Two times a day (BID) | ORAL | Status: DC
  Administered 2015-07-19 – 2015-07-20 (×3): 400 mg via ORAL
  Filled 2015-07-19 (×3): qty 2

## 2015-07-19 MED ORDER — PHENAZOPYRIDINE HCL 100 MG PO TABS
100.0000 mg | ORAL_TABLET | Freq: Once | ORAL | Status: AC
  Administered 2015-07-19: 100 mg via ORAL
  Filled 2015-07-19: qty 1

## 2015-07-19 NOTE — Progress Notes (Addendum)
[facility] [place] [facility]   PATIENT NAME: Allison DimitriMargaret Shepard    MR#:  [id number]  DATE OF BIRTH:  03-05-1934  SUBJECTIVE:  CHIEF COMPLAINT:  Patient is more awake and alert today. Shortness of breath is significantly improved. Reporting productive cough.   REVIEW OF SYSTEMS:  General ROS: Reporting fatigue  and tiredness Respiratory ROS: positive for  Productive cough and shortness of breath Cardiovascular ROS: positive for irregular heartbeat.denies any chest pain or dizziness  Gastrointestinal ROS: no abdominal pain, change in bowel habits, or black or bloody stools Neurological ROS: no TIA or stroke symptoms   DRUG ALLERGIES:   Allergies  Allergen Reactions  . Hydromorphone Hcl Nausea And Vomiting  . Ibuprofen Other (See Comments)    Pt was told by her MD not to take this medication.    . Mucinex [Guaifenesin Er] Other (See Comments)    Red all over. Felt like i was on fire  . Percocet [Oxycodone-Acetaminophen] Hives    VITALS:  Blood pressure 132/69, pulse 96, temperature 98.2 F (36.8 C), temperature source Oral, resp. rate 23, height 5\' 5"  (1.651 m), weight 109.3 kg (240 lb 15.4 oz), SpO2 94 %.  PHYSICAL EXAMINATION:  GENERAL:  81 y.o.-year-old patient lying in the bed with no acute distress.  EYES: Pupils equal, round, reactive to light and accommodation. No scleral icterus. Extraocular muscles intact.  HEENT: Head atraumatic, normocephalic. Oropharynx and nasopharynx clear.  NECK:  Supple, no jugular venous distention. No thyroid enlargement, no tenderness.  LUNGS: Moderate breath sounds bilaterally, no wheezing, rales,rhonchi or crepitation. No use of accessory muscles of respiration.  CARDIOVASCULAR: Irregularly irregular. Faint murmurs, no rubs, or gallops.  ABDOMEN: Soft, nontender, nondistended. Bowel sounds present. No organomegaly or mass.  EXTREMITIES: No pedal edema, cyanosis, or clubbing.  NEUROLOGIC: Cranial nerves II  through XII are intact. Muscle strength 5/5 in all extremities. Sensation intact. Gait not checked.  PSYCHIATRIC: The patient is alert and oriented x 3.  SKIN: No obvious rash, lesion, or ulcer.    LABORATORY PANEL:   CBC  Recent Labs Lab 07/19/15 0513  WBC 7.5  HGB 10.7*  HCT 32.2*  PLT 173   ------------------------------------------------------------------------------------------------------------------  Chemistries   Recent Labs Lab 07/19/15 0513  NA 136  K 4.3  CL 107  CO2 26  GLUCOSE 156*  BUN 21*  CREATININE 1.18*  CALCIUM 8.1*  MG 1.8  AST 12*  ALT 12*  ALKPHOS 80  BILITOT 0.7   ------------------------------------------------------------------------------------------------------------------  Cardiac Enzymes  Recent Labs Lab 07/17/15 1250  TROPONINI 0.03   ------------------------------------------------------------------------------------------------------------------  RADIOLOGY:  Dg Wrist 2 Views Left  07/18/2015  CLINICAL DATA:  Persistent wrist pain. Patient fell approximately 6 weeks ago and had subsequent surgery. EXAM: LEFT WRIST - 2 VIEW COMPARISON:  Radiographs 06/09/2015. FINDINGS: No postoperative radiographs available. Patient is status post plate and screw fixation of the distal radial and ulnar fractures. The hardware appears intact. There is persistent impaction of the radial fracture, but no significant displacement. There are irregular ossific densities surrounding the fractures which may reflect periosteal bone formation or bone grafting. There is osseous resorption around the fracture without gross bone destruction. The carpal bones appear intact. IMPRESSION: Interval ORIF of distal radial and ulnar fractures without progressive displacement. Bone reabsorption around the fractures is noted without gross bone destruction to suggest postoperative infection. Correlate clinically. Electronically Signed   By: Carey BullocksWilliam  Veazey M.D.   On:  07/18/2015 18:22   Ct Angio Chest Pe W/cm &/  or Wo Cm  07/17/2015  CLINICAL DATA:  Dyspnea.  Atrial fibrillation. EXAM: CT ANGIOGRAPHY CHEST WITH CONTRAST TECHNIQUE: Multidetector CT imaging of the chest was performed using the standard protocol during bolus administration of intravenous contrast. Multiplanar CT image reconstructions and MIPs were obtained to evaluate the vascular anatomy. CONTRAST:  80mL OMNIPAQUE IOHEXOL 350 MG/ML SOLN COMPARISON:  04/06/2015 chest CT. Chest radiograph from earlier today. FINDINGS: Mediastinum/Nodes: The study is moderate quality for the evaluation of pulmonary embolism, with evaluation of the subsegmental pulmonary arteries limited by motion artifact. There are no filling defects in the central, lobar or segmental pulmonary artery branches to suggest acute pulmonary embolism. Great vessels are normal in course and caliber. Mild cardiomegaly . No pericardial fluid/thickening. Left anterior descending, left circumflex and right coronary atherosclerosis. Normal visualized thyroid. There is fluid and debris in the mid thoracic esophagus. No axillary adenopathy. Mildly enlarged 1.0 cm right paratracheal node (series 4/ image 47). Mildly enlarged 1.3 cm subcarinal node (4/62). Mildly enlarged 1.2 cm left infrahilar node (4/66) . Mildly enlarged 1.2 cm right infrahilar node (4/72). Lungs/Pleura: No pneumothorax. No pleural effusion. There is severe patchy consolidation and ground-glass opacity involving most of the left upper lobe including the lingula, with internal air bronchograms and no appreciable central airway stenosis. There is a subsolid 2.1 x 1.2 cm nodule in the posterior right lower lobe (series 6/ image 75), not definitely seen on the prior chest CT. Dependent density throughout both lungs likely represents passive atelectasis. Upper abdomen: Stable simple 6.5 cm renal cyst in the upper left kidney. Musculoskeletal: No aggressive appearing focal osseous lesions. Moderate  degenerative changes in the thoracic spine. Partially visualized is posterior spinal fusion hardware in the upper lumbar spine. Review of the MIP images confirms the above findings. IMPRESSION: 1. No evidence of pulmonary embolism, with mild limitations as described. 2. Severe patchy consolidation and ground-glass opacity involving most of the left upper lobe, with internal air bronchograms. New subsolid nodule in the posterior right lower lobe. No appreciable central airway stenosis. These findings are favored to represent a multilobar infectious pneumonia, however an underlying neoplasm is not entirely excluded and a follow-up post treatment chest CT is advised in 6-8 weeks. 3. Mild mediastinal and bilateral hilar lymphadenopathy, nonspecific. 4. Three-vessel coronary atherosclerosis. Electronically Signed   By: Delbert Phenix M.D.   On: 07/17/2015 16:08   US Venous Img Lower Bilateral  07/18/2015  CLINICAL DATA:  Left greater than right bilateral lower extremity swelling for 1 month. EXAM: BILATERAL LOWER EXTREMITY VENOUS DOPPLER ULTRASOUND TECHNIQUE: Gray-scale sonography with graded compression, as well as color Doppler and duplex ultrasound were performed to evaluate the lower extremity deep venous systems from the level of the common femoral vein and including the common femoral, femoral, profunda femoral, popliteal and calf veins including the posterior tibial, peroneal and gastrocnemius veins when visible. The superficial great saphenous vein was also interrogated. Spectral Doppler was utilized to evaluate flow at rest and with distal augmentation maneuvers in the common femoral, femoral and popliteal veins. COMPARISON:  04/07/2015 bilateral lower extremity venous Doppler scan. FINDINGS: RIGHT LOWER EXTREMITY Common Femoral Vein: No evidence of thrombus. Normal compressibility, respiratory phasicity and response to augmentation. Saphenofemoral Junction: No evidence of thrombus. Normal compressibility and  flow on color Doppler imaging. Profunda Femoral Vein: No evidence of thrombus. Normal compressibility and flow on color Doppler imaging. Femoral Vein: No evidence of thrombus. Normal compressibility, respiratory phasicity and response to augmentation. Popliteal Vein: No evidence of thrombus. Normal compressibility,  respiratory phasicity and response to augmentation. Calf Veins: No evidence of thrombus. Normal compressibility and flow on color Doppler imaging. Right peroneal veins could not be visualized, possibly due to technical limitations. Superficial Great Saphenous Vein: No evidence of thrombus. Normal compressibility and flow on color Doppler imaging. Venous Reflux:  None. Other Findings:  None. LEFT LOWER EXTREMITY Common Femoral Vein: No evidence of thrombus. Normal compressibility, respiratory phasicity and response to augmentation. Saphenofemoral Junction: No evidence of thrombus. Normal compressibility and flow on color Doppler imaging. Profunda Femoral Vein: No evidence of thrombus. Normal compressibility and flow on color Doppler imaging. Femoral Vein: No evidence of thrombus. Normal compressibility, respiratory phasicity and response to augmentation. Popliteal Vein: No evidence of thrombus. Normal compressibility, respiratory phasicity and response to augmentation. Calf Veins: No evidence of thrombus. Normal compressibility and flow on color Doppler imaging. Superficial Great Saphenous Vein: No evidence of thrombus. Normal compressibility and flow on color Doppler imaging. Venous Reflux:  None. Other Findings: There is a 5.7 x 0.9 x 2.7 cm cystic structure in the left popliteal fossa without internal vascularity on Doppler, which measured 3.2 x 1.4 x 2.4 cm on 04/11/2015 and likely represents a Baker's cyst. IMPRESSION: 1. No evidence of deep venous thrombosis in either lower extremity. 2. 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 Electronically Signed   By: Delbert Phenix M.D.    On: 07/18/2015 12:57    EKG:   Orders placed or performed during the hospital encounter of 07/17/15  . ED EKG  . ED EKG    ASSESSMENT AND PLAN:   1. Sepsis. Secondary to multilobar pneumonia  blood cultures  one bottle with gram-positive bacteria and other bottle is negative and sputum cultures if collected,   continue on levofloxacin, vancomycin and Zosyn for healthcare associated pneumonia patient is clinically improving. We will change IV to by mouth antibiotics in a.m.  Pulmonology is recommending outpatient repeat CAT scan chest in 2-3 months  Check a.m. Labs CT chest is negative for pulmonary embolism  2. Acute respiratory failure with hypoxia.  Continue oxygen therapy as needed to maintain pulse ox greater than 88%    appreciate pulmonologist recommendations Nebulizer treatments will be provided as needed basis   3. Left upper lobe pneumonia Continue patient on broad-spectrum antibiotic therapy Follow-up on sputum culture and sensitivity   4. Hemoptysis,  will probably from pneumonia  Resume  Anticoagulants Xarelto   CT angiogram neg for  pulmonary embolism  5. A. fib, RVR,  Rate controlled with amiodarone IV drip, weaned off to by mouth amiodarone 400 mg by mouth twice a day  Continue Cardizem and digoxin. Continue xarelto Appreciate cardiology recommendations   6. Hypotension, possibly  secondary to sepsis and secondary to rate controlling drugs for A. fib, RVR  Improved  7.Acute  On ch renal insufficiency. Secondary to IV contrast used for the CT angiogram of the chest Provid gentle hydration with IV fluids, creatinine is 1.19--> 1.04 --> 1.13-->. 1.18   8. Metabolic encephalopathy.  Improving, continue Supportive therapy  9. Recent history of left wrist fracture status post open reduction and internal fixation  Continue pain management  appreciate orthopedics input and recommend patient to follow up with orthopedics as an outpatient in 2 weeks as  recommended by them  Deconditioning with physical therapy     All the records are reviewed and case discussed with Care Management/Social Workerr. Management plans discussed with the patient, family and they are in agreement.  CODE STATUS:  FC   TOTAL CARE TIME TAKING CARE OF THIS PATIENT: 35 minutes.   POSSIBLE D/C IN 1-2  DAYS, DEPENDING ON CLINICAL CONDITION.   Samantha Olivera M.D on 12/4/20[facility]1 PM  Between 7am to 6pm - Pager - [phone] After 6pm go to [email or web address] - password [email or web address] [facility]  [place] [facility]  Office  [phone]  CC: Primary care physician; Mickey Farber, MD

## 2015-07-19 NOTE — Progress Notes (Addendum)
[facility]  SUBJECTIVE: feels better this am. Wants to go home. Less short of breath. Telemetry still shows afib with fairly rapid vr.   Filed Vitals:   07/19/15 0500 07/19/15 0522 07/19/15 0600 07/19/15 0700  BP: 136/100 130/86 133/89 144/101  Pulse: 73  150 125  Temp:      TempSrc:      Resp: 22  20 28   Height:      Weight:      SpO2: 96%  90% 97%    Intake/Output Summary (Last 24 hours) at 07/19/15 0912 Last data filed at 07/19/15 0600  Gross per 24 hour  Intake 2683.7 ml  Output   1202 ml  Net 1481.7 ml    LABS: Basic Metabolic Panel:  Recent Labs  16/05/9611/02/16 1250 07/18/15 0428 07/19/15 0513  NA 137 136 136  K 3.9 4.1 4.3  CL 104 106 107  CO2 26 26 26   GLUCOSE 144* 125* 156*  BUN 22* 20 21*  CREATININE 1.04* 1.13* 1.18*  CALCIUM 8.2* 7.9* 8.1*  MG 1.7  --  1.8   Liver Function Tests:  Recent Labs  07/17/15 1250 07/19/15 0513  AST 15 12*  ALT 15 12*  ALKPHOS 101 80  BILITOT 0.5 0.7  PROT 6.0* 5.7*  ALBUMIN 3.1* 2.6*   No results for input(s): LIPASE, AMYLASE in the last 72 hours. CBC:  Recent Labs  07/17/15 1158 07/18/15 0428 07/19/15 0513  WBC 18.4* 11.6* 7.5  NEUTROABS 16.2*  --   --   HGB 13.8 10.6* 10.7*  HCT 42.7 32.4* 32.2*  MCV 99.1 98.8 98.3  PLT 250 168 173   Cardiac Enzymes:  Recent Labs  07/17/15 1250  TROPONINI 0.03   BNP: Invalid input(s): POCBNP D-Dimer: No results for input(s): DDIMER in the last 72 hours. Hemoglobin A1C: No results for input(s): HGBA1C in the last 72 hours. Fasting Lipid Panel: No results for input(s): CHOL, HDL, LDLCALC, TRIG, CHOLHDL, LDLDIRECT in the last 72 hours. Thyroid Function Tests:  Recent Labs  07/17/15 1250  TSH 2.703   Anemia Panel: No results for input(s): VITAMINB12, FOLATE, FERRITIN, TIBC, IRON, RETICCTPCT in the last 72 hours.   Physical Exam: Blood pressure 144/101, pulse 125, temperature 98.2 F (36.8 C), temperature source Oral,  resp. rate 28, height 5\' 5"  (1.651 m), weight 109.3 kg (240 lb 15.4 oz), SpO2 97 %.    General appearance: alert and cooperative Resp: rhonchi bilaterally Cardio: irregularly irregular rhythm GI: soft, non-tender; bowel sounds normal; no masses,  no organomegaly Extremities: extremities normal, atraumatic, no cyanosis or edema Neurologic: Grossly normal  TELEMETRY: Reviewed telemetry pt in afib with variable but fairly rapid vr.   ASSESSMENT AND PLAN:  Active Problems:   Pneumonia-currently on abx. Improving somewhat. Will continue with iv abx. Agree with transfer to telemetry   Acute respiratory failure with hypoxia (HCC)-improved but still sob   Left upper lobe pneumonia-as per above   Hemoptysis   Atrial fibrillation with RVR (HCC)-still somewhat rapid but improved. Asymptomatic. Will attempt to discontinue iv amiodarone and convert to amiodarone 400 mg bid. Conitnue with xarelto and digoxin.    Hypotension-improved   Renal insufficiency-creatinine is 1.18. Stable at present.    Obesity   Metabolic encephalopathy    Dalia HeadingFATH,Allison Jarnagin A., MD, [facility] 07/19/2015 9:12 AM

## 2015-07-19 NOTE — Progress Notes (Signed)
[facility]  ROUNDING NOTE   Subjective:  Renal function overall appears to be stable. Creatinine at 1.1. She continues on IV fluid hydration. Still has a bit of atrial fibrillation with tachycardia..   Objective:  Vital signs in last 24 hours:  Temp:  [98.2 F (36.8 C)-98.3 F (36.8 C)] 98.2 F (36.8 C) (12/03 2000) Pulse Rate:  [56-150] 125 (12/04 0700) Resp:  [16-28] 28 (12/04 0700) BP: (106-165)/(57-130) 144/101 mmHg (12/04 0700) SpO2:  [88 %-100 %] 97 % (12/04 0700)  Weight change:  Filed Weights   07/17/15 1649 07/17/15 1729  Weight: 99.791 kg (220 lb) 109.3 kg (240 lb 15.4 oz)    Intake/Output: I/O last 3 completed shifts: In: 4816.1 [I.V.:4116.1; IV Piggyback:700] Out: 1652 [Urine:1652]   Intake/Output this shift:     Physical Exam: General: NAD, resting in bed  Head: Normocephalic, atraumatic. Moist oral mucosal membranes  Eyes: Anicteric  Neck: Supple, trachea midline  Lungs:  Clear to auscultation  Heart: Irregular tachycardic  Abdomen:  Soft, nontender, BS present  Extremities: 2+ peripheral edema.  Neurologic: Nonfocal, moving all four extremities  Skin: No lesions       Basic Metabolic Panel:  Recent Labs Lab 07/17/15 1250 07/18/15 0428 07/19/15 0513  NA 137 136 136  K 3.9 4.1 4.3  CL 104 106 107  CO2 26 26 26   GLUCOSE 144* 125* 156*  BUN 22* 20 21*  CREATININE 1.04* 1.13* 1.18*  CALCIUM 8.2* 7.9* 8.1*  MG 1.7  --  1.8    Liver Function Tests:  Recent Labs Lab 07/17/15 1250 07/19/15 0513  AST 15 12*  ALT 15 12*  ALKPHOS 101 80  BILITOT 0.5 0.7  PROT 6.0* 5.7*  ALBUMIN 3.1* 2.6*   No results for input(s): LIPASE, AMYLASE in the last 168 hours. No results for input(s): AMMONIA in the last 168 hours.  CBC:  Recent Labs Lab 07/17/15 1158 07/18/15 0428 07/19/15 0513  WBC 18.4* 11.6* 7.5  NEUTROABS 16.2*  --   --   HGB 13.8 10.6* 10.7*  HCT 42.7 32.4* 32.2*  MCV 99.1 98.8 98.3  PLT 250 168 173     Cardiac Enzymes:  Recent Labs Lab 07/17/15 1250  TROPONINI 0.03    BNP: Invalid input(s): POCBNP  CBG:  Recent Labs Lab 07/18/15 1122 07/18/15 1603 07/18/15 1933 07/18/15 2341 07/19/15 0723  GLUCAP 167* 121* 173* 177* 156*    Microbiology: Results for orders placed or performed during the hospital encounter of 07/17/15  Blood culture (routine x 2)     Status: None (Preliminary result)   Collection Time: 07/17/15 11:58 AM  Result Value Ref Range Status   Specimen Description BLOOD RIGHT ASSIST CONTROL  Final   Special Requests BAA,5ML,AER,ANA  Final   Culture NO GROWTH 2 DAYS  Final   Report Status PENDING  Incomplete  Blood culture (routine x 2)     Status: None (Preliminary result)   Collection Time: 07/17/15 11:58 AM  Result Value Ref Range Status   Specimen Description BLOOD LEFT ASSIST CONTROL  Final   Special Requests BAA,5ML,ANA,AER  Final   Culture  Setup Time   Final    GRAM POSITIVE COCCI AEROBIC BOTTLE ONLY CRITICAL RESULT CALLED TO, READ BACK BY AND VERIFIED WITH: MICHELLE WILLIAMS AT 1610 ON 07/18/15 [facility] CONFIRMED BY [facility]    Culture   Final    GRAM POSITIVE COCCI AEROBIC BOTTLE ONLY IDENTIFICATION TO FOLLOW    Report Status PENDING  Incomplete  MRSA  PCR Screening     Status: None   Collection Time: 07/17/15  6:03 PM  Result Value Ref Range Status   MRSA by PCR NEGATIVE NEGATIVE Final    Comment:        The GeneXpert MRSA Assay (FDA approved for NASAL specimens only), is one component of a comprehensive MRSA colonization surveillance program. It is not intended to diagnose MRSA infection nor to guide or monitor treatment for MRSA infections.     Coagulation Studies:  Recent Labs  07/17/15 1158  LABPROT 24.6*  INR 2.24    Urinalysis:  Recent Labs  07/17/15 1448  COLORURINE YELLOW*  LABSPEC 1.024  PHURINE 5.0  GLUCOSEU NEGATIVE  HGBUR NEGATIVE  BILIRUBINUR NEGATIVE  KETONESUR NEGATIVE  PROTEINUR >500*  NITRITE  POSITIVE*  LEUKOCYTESUR NEGATIVE      Imaging: Dg Chest 1 View  07/17/2015  CLINICAL DATA:  Altered mental status. EXAM: CHEST 1 VIEW COMPARISON:  April 11, 2015. FINDINGS: Stable cardiomegaly. Right lung is clear. No pneumothorax or significant pleural effusion is noted. New airspace opacity is noted in left upper lobe most consistent with pneumonia. Bony thorax is unremarkable. IMPRESSION: Probable left upper lobe pneumonia. Continued radiographic followup is recommended to ensure resolution. Electronically Signed   By: Lupita Raider, M.D.   On: 07/17/2015 13:25   Dg Wrist 2 Views Left  07/18/2015  CLINICAL DATA:  Persistent wrist pain. Patient fell approximately 6 weeks ago and had subsequent surgery. EXAM: LEFT WRIST - 2 VIEW COMPARISON:  Radiographs 06/09/2015. FINDINGS: No postoperative radiographs available. Patient is status post plate and screw fixation of the distal radial and ulnar fractures. The hardware appears intact. There is persistent impaction of the radial fracture, but no significant displacement. There are irregular ossific densities surrounding the fractures which may reflect periosteal bone formation or bone grafting. There is osseous resorption around the fracture without gross bone destruction. The carpal bones appear intact. IMPRESSION: Interval ORIF of distal radial and ulnar fractures without progressive displacement. Bone reabsorption around the fractures is noted without gross bone destruction to suggest postoperative infection. Correlate clinically. Electronically Signed   By: Carey Bullocks M.D.   On: 07/18/2015 18:22   Ct Angio Chest Pe W/cm &/or Wo Cm  07/17/2015  CLINICAL DATA:  Dyspnea.  Atrial fibrillation. EXAM: CT ANGIOGRAPHY CHEST WITH CONTRAST TECHNIQUE: Multidetector CT imaging of the chest was performed using the standard protocol during bolus administration of intravenous contrast. Multiplanar CT image reconstructions and MIPs were obtained to evaluate the  vascular anatomy. CONTRAST:  80mL OMNIPAQUE IOHEXOL 350 MG/ML SOLN COMPARISON:  04/06/2015 chest CT. Chest radiograph from earlier today. FINDINGS: Mediastinum/Nodes: The study is moderate quality for the evaluation of pulmonary embolism, with evaluation of the subsegmental pulmonary arteries limited by motion artifact. There are no filling defects in the central, lobar or segmental pulmonary artery branches to suggest acute pulmonary embolism. Great vessels are normal in course and caliber. Mild cardiomegaly . No pericardial fluid/thickening. Left anterior descending, left circumflex and right coronary atherosclerosis. Normal visualized thyroid. There is fluid and debris in the mid thoracic esophagus. No axillary adenopathy. Mildly enlarged 1.0 cm right paratracheal node (series 4/ image 47). Mildly enlarged 1.3 cm subcarinal node (4/62). Mildly enlarged 1.2 cm left infrahilar node (4/66) . Mildly enlarged 1.2 cm right infrahilar node (4/72). Lungs/Pleura: No pneumothorax. No pleural effusion. There is severe patchy consolidation and ground-glass opacity involving most of the left upper lobe including the lingula, with internal air bronchograms and no appreciable central  airway stenosis. There is a subsolid 2.1 x 1.2 cm nodule in the posterior right lower lobe (series 6/ image 75), not definitely seen on the prior chest CT. Dependent density throughout both lungs likely represents passive atelectasis. Upper abdomen: Stable simple 6.5 cm renal cyst in the upper left kidney. Musculoskeletal: No aggressive appearing focal osseous lesions. Moderate degenerative changes in the thoracic spine. Partially visualized is posterior spinal fusion hardware in the upper lumbar spine. Review of the MIP images confirms the above findings. IMPRESSION: 1. No evidence of pulmonary embolism, with mild limitations as described. 2. Severe patchy consolidation and ground-glass opacity involving most of the left upper lobe, with internal  air bronchograms. New subsolid nodule in the posterior right lower lobe. No appreciable central airway stenosis. These findings are favored to represent a multilobar infectious pneumonia, however an underlying neoplasm is not entirely excluded and a follow-up post treatment chest CT is advised in 6-8 weeks. 3. Mild mediastinal and bilateral hilar lymphadenopathy, nonspecific. 4. Three-vessel coronary atherosclerosis. Electronically Signed   By: Delbert Phenix M.D.   On: 07/17/2015 16:08   US Venous Img Lower Bilateral  07/18/2015  CLINICAL DATA:  Left greater than right bilateral lower extremity swelling for 1 month. EXAM: BILATERAL LOWER EXTREMITY VENOUS DOPPLER ULTRASOUND TECHNIQUE: Gray-scale sonography with graded compression, as well as color Doppler and duplex ultrasound were performed to evaluate the lower extremity deep venous systems from the level of the common femoral vein and including the common femoral, femoral, profunda femoral, popliteal and calf veins including the posterior tibial, peroneal and gastrocnemius veins when visible. The superficial great saphenous vein was also interrogated. Spectral Doppler was utilized to evaluate flow at rest and with distal augmentation maneuvers in the common femoral, femoral and popliteal veins. COMPARISON:  04/07/2015 bilateral lower extremity venous Doppler scan. FINDINGS: RIGHT LOWER EXTREMITY Common Femoral Vein: No evidence of thrombus. Normal compressibility, respiratory phasicity and response to augmentation. Saphenofemoral Junction: No evidence of thrombus. Normal compressibility and flow on color Doppler imaging. Profunda Femoral Vein: No evidence of thrombus. Normal compressibility and flow on color Doppler imaging. Femoral Vein: No evidence of thrombus. Normal compressibility, respiratory phasicity and response to augmentation. Popliteal Vein: No evidence of thrombus. Normal compressibility, respiratory phasicity and response to augmentation. Calf  Veins: No evidence of thrombus. Normal compressibility and flow on color Doppler imaging. Right peroneal veins could not be visualized, possibly due to technical limitations. Superficial Great Saphenous Vein: No evidence of thrombus. Normal compressibility and flow on color Doppler imaging. Venous Reflux:  None. Other Findings:  None. LEFT LOWER EXTREMITY Common Femoral Vein: No evidence of thrombus. Normal compressibility, respiratory phasicity and response to augmentation. Saphenofemoral Junction: No evidence of thrombus. Normal compressibility and flow on color Doppler imaging. Profunda Femoral Vein: No evidence of thrombus. Normal compressibility and flow on color Doppler imaging. Femoral Vein: No evidence of thrombus. Normal compressibility, respiratory phasicity and response to augmentation. Popliteal Vein: No evidence of thrombus. Normal compressibility, respiratory phasicity and response to augmentation. Calf Veins: No evidence of thrombus. Normal compressibility and flow on color Doppler imaging. Superficial Great Saphenous Vein: No evidence of thrombus. Normal compressibility and flow on color Doppler imaging. Venous Reflux:  None. Other Findings: There is a 5.7 x 0.9 x 2.7 cm cystic structure in the left popliteal fossa without internal vascularity on Doppler, which measured 3.2 x 1.4 x 2.4 cm on 04/11/2015 and likely represents a Baker's cyst. IMPRESSION: 1. No evidence of deep venous thrombosis in either lower extremity. 2. Chronic  left popliteal fossa cystic structure, likely a Baker cyst. Electronically Signed   By: Delbert PhenixJason A Poff M.D.   On: 07/18/2015 12:57     Medications:   . 0.9 % NaCl with KCl 20 mEq / L 100 mL/hr at 07/18/15 2338   . amiodarone  400 mg Oral BID  . antiseptic oral rinse  7 mL Mouth Rinse BID  . digoxin  0.125 mg Oral Daily  . donepezil  5 mg Oral QHS  . fluticasone  1 spray Each Nare Daily  . furosemide  40 mg Oral Daily  . insulin aspart  0-9 Units Subcutaneous TID  WC  . insulin glargine  30 Units Subcutaneous q morning - 10a  . levofloxacin (LEVAQUIN) IV  750 mg Intravenous Q24H  . nystatin  1 g Topical BID  . phenazopyridine  100 mg Oral TID WC  . piperacillin-tazobactam (ZOSYN)  IV  3.375 g Intravenous 3 times per day  . rivaroxaban  20 mg Oral Daily  . simvastatin  10 mg Oral Daily  . sodium chloride  3 mL Intravenous Q12H  . vancomycin  1,250 mg Intravenous Q18H   acetaminophen **OR** acetaminophen, levalbuterol, ondansetron **OR** ondansetron (ZOFRAN) IV, oxymetazoline  Assessment/ Plan:  81 y.o. female with atrial fibrillation, hypertension, COPD, osteoarthritis, hyperlipidemia, osteopenia, allergic rhinitis, lumbar spinal stenosis presents as a follow up patient chronic kidney disease stage III with proteinuria.   1. Chronic Kidney Disease stage III/proteinuria/diabetes mellitus type 2 with CKD - Kidney function relatively stable despite receiving contrast for CT scan PE protocol. Okay to continue IV fluid hydration until discharge. Continue to monitor renal function while here.   2. Hypertension.  Blood pressure still a bit high at 144/101 however blood pressure has been labile. Continue diltiazem and metoprolol at this time. Consider adding ACE inhibitor or ARB as an outpatient.   3. Anemia of chronic kidney disease: Hemoglobin stable at 10.7. No indication for Epogen at present.   4. Secondary Hyperparathyroidism: Continue to monitor bone mineral metabolism parameters as an outpatient.    LOS: 2 Kyndal Gloster 12/4/201610:06 AM

## 2015-07-19 NOTE — Consult Note (Signed)
ANTIBIOTIC CONSULT NOTE - INITIAL  Pharmacy Consult for Levofloxacin, Zosyn, and vancomycin Indication: pneumonia/UTI/bacteremia  Allergies  Allergen Reactions  . Hydromorphone Hcl Nausea And Vomiting  . Ibuprofen Other (See Comments)    Pt was told by her MD not to take this medication.    . Mucinex [Guaifenesin Er] Other (See Comments)    Red all over. Felt like i was on fire  . Percocet [Oxycodone-Acetaminophen] Hives   Patient Measurements: Height: 5\' 5"  (165.1 cm) Weight: 240 lb 15.4 oz (109.3 kg) IBW/kg (Calculated) : 57  Vital Signs: BP: 132/69 mmHg (12/04 1000) Pulse Rate: 96 (12/04 1000)  Recent Labs  07/17/15 1158 07/17/15 1250 07/18/15 0428 07/19/15 0513  WBC 18.4*  --  11.6* 7.5  HGB 13.8  --  10.6* 10.7*  PLT 250  --  168 173  CREATININE  --  1.04* 1.13* 1.18*   Estimated Creatinine Clearance: 47.5 mL/min (by C-G formula based on Cr of 1.18).   Microbiology: Recent Results (from the past 720 hour(s))  Blood culture (routine x 2)     Status: None (Preliminary result)   Collection Time: 07/17/15 11:58 AM  Result Value Ref Range Status   Specimen Description BLOOD RIGHT ASSIST CONTROL  Final   Special Requests BAA,5ML,AER,ANA  Final   Culture NO GROWTH 2 DAYS  Final   Report Status PENDING  Incomplete  Blood culture (routine x 2)     Status: None (Preliminary result)   Collection Time: 07/17/15 11:58 AM  Result Value Ref Range Status   Specimen Description BLOOD LEFT ASSIST CONTROL  Final   Special Requests BAA,5ML,ANA,AER  Final   Culture  Setup Time   Final    GRAM POSITIVE COCCI AEROBIC BOTTLE ONLY CRITICAL RESULT CALLED TO, READ BACK BY AND VERIFIED WITH: MICHELLE WILLIAMS AT 0356 ON 07/18/15 [facility] CONFIRMED BY [facility]    Culture   Final    GRAM POSITIVE COCCI AEROBIC BOTTLE ONLY IDENTIFICATION TO FOLLOW    Report Status PENDING  Incomplete  MRSA PCR Screening     Status: None   Collection Time: 07/17/15  6:03 PM  Result Value Ref Range Status    MRSA by PCR NEGATIVE NEGATIVE Final    Comment:        The GeneXpert MRSA Assay (FDA approved for NASAL specimens only), is one component of a comprehensive MRSA colonization surveillance program. It is not intended to diagnose MRSA infection nor to guide or monitor treatment for MRSA infections.     Medical History: Past Medical History  Diagnosis Date  . COPD (chronic obstructive pulmonary disease) (HCC)   . Atrial fibrillation (HCC)   . Hypertension   . Hypercholesteremia   . Pneumonia   . Diabetes mellitus without complication (HCC)     type 2  . Headache     migraines - years ago  . Complication of anesthesia     difficulty to wake up after an 8 hour back surgery   Assessment: 81 yo female presenting to ED with A.Fib, worsening confusion, and  Leukocytosis. Chest X-ray shows LUL PNA and UA is nitrite positive with many bacteria.  Pharmacy was verbally consulted for Zosyn and Levofloxacin dosing per ICU MD. Vancomycin consult was verbally D'cd by Dr. Dema SeverinMungal. MD stated that patient has bilateral leg swelling that is suspicious for cellulitis as well.  He is aware of double coverage at this time, but patient has had recent hospital visits.   Plan:  Will continue Zosyn 3.375 EI q8  hours.   SCr increasing so will change Levaquin to 750 mg iv q 48 hours.   Will continue vancomycin 1250 mg IV q 18 hours ordered with stacked dosing. Level before 5th dose. [name], PharmD   07/19/2015

## 2015-07-19 NOTE — Progress Notes (Deleted)
81 y/o F on rivaroxaban 20 mg daily for afib. SCr continues to increase with CrCl < 50 ml/min so will change rivaroxaban dosing to 15 mg daily.   Luisa HartScott Jadeyn Hargett, PharmD

## 2015-07-19 NOTE — Consult Note (Signed)
ORTHOPAEDIC CONSULTATION  REQUESTING PHYSICIAN: Ramonita Lab, MD  Chief Complaint:   Left wrist pain.  History of Present Illness: Allison Shepard is a 81 y.o. female with multiple medical problems including diabetes, hypertension, chronic atrial fibrillation, COPD, and obesity, who was admitted for treatment of pneumonia. The patient had sustained a fall onto her outstretched left hand on 06/09/15. She was seen at [facility] where x-rays demonstrated a badly comminuted intra-articular fracture of the left distal radius and ulna. Subsequent, she underwent open reduction internal fixation of both bones. Postoperatively, she was treated in a cast. According to the patient, this cast was removed 10-14 days ago and she was placed into a Velcro splint. The patient still notes discomfort in the wrist, as well as some swelling. However, she denies any numbness and is able to actively flex and extend her digits. She is scheduled to see her surgeon in follow-up in another week or so.  Past Medical History  Diagnosis Date  . COPD (chronic obstructive pulmonary disease) (HCC)   . Atrial fibrillation (HCC)   . Hypertension   . Hypercholesteremia   . Pneumonia   . Diabetes mellitus without complication (HCC)     type 2  . Headache     migraines - years ago  . Complication of anesthesia     difficulty to wake up after an 8 hour back surgery   Past Surgical History  Procedure Laterality Date  . Abdominal hysterectomy    . Shoulder fusion surgery Right   . Knee surgery Right     knee replacement  . Hip surgery Right     hip replacement  . Back surgery    . Eye surgery      cataract surgery ? eye  . Fracture surgery      right arm 20 years ago  . Colonoscopy      x 3  . Orif distal radius fracture Left 06/17/2015  . Open reduction internal fixation (orif) distal radial fracture Left 06/17/2015    Procedure: OPEN  REDUCTION INTERNAL FIXATION LEFT  DISTAL RADIUS AND ULNA FRACTURES;  Surgeon: Tarry Kos, MD;  Location: [facility] OR;  Service: Orthopedics;  Laterality: Left;   Social History   Social History  . Marital Status: Married    Spouse Name: N/A  . Number of Children: N/A  . Years of Education: N/A   Social History Main Topics  . Smoking status: Former Smoker    Quit date: 06/15/1992  . Smokeless tobacco: Never Used  . Alcohol Use: 8.4 oz/week    7 Glasses of wine, 7 Shots of liquor, 0 Standard drinks or equivalent per week     Comment: daily  . Drug Use: No  . Sexual Activity: Not Asked   Other Topics Concern  . None   Social History Narrative   Family History  Problem Relation Age of Onset  . Heart disease Father    Allergies  Allergen Reactions  . Hydromorphone Hcl Nausea And Vomiting  . Ibuprofen Other (See Comments)    Pt was told by her MD not to take this medication.    . Mucinex [Guaifenesin Er] Other (See Comments)    Red all over. Felt like i was on fire  . Percocet [Oxycodone-Acetaminophen] Hives   Prior to Admission medications   Medication Sig Start Date End Date Taking? Authorizing Provider  albuterol (PROVENTIL HFA;VENTOLIN HFA) 108 (90 BASE) MCG/ACT inhaler Inhale 2 puffs into the lungs every 6 (six) hours  as needed for wheezing or shortness of breath.    Historical Provider, MD  albuterol (PROVENTIL) (2.5 MG/3ML) 0.083% nebulizer solution Take 2.5 mg by nebulization every 6 (six) hours as needed for wheezing or shortness of breath.    Historical Provider, MD  Cholecalciferol (VITAMIN D-3) 1000 UNITS CAPS Take 1,000 Units by mouth daily.    Historical Provider, MD  digoxin (LANOXIN) 0.125 MG tablet Take 1 tablet (0.125 mg total) by mouth daily. 04/14/15   Adrian SaranSital Mody, MD  diltiazem (TIAZAC) 180 MG 24 hr capsule Take 1 capsule (180 mg total) by mouth daily. 04/14/15   Sital Mody, MD  donepezil (ARICEPT) 5 MG tablet Take 5 mg by mouth at bedtime.    Historical Provider,  MD  fluticasone (FLONASE) 50 MCG/ACT nasal spray Place 1 spray into both nostrils daily. 03/20/15   Auburn BilberryShreyang Patel, MD  furosemide (LASIX) 40 MG tablet Take 40 mg by mouth daily.    Historical Provider, MD  HYDROcodone-acetaminophen (NORCO) 10-325 MG tablet Take 1-2 tablets by mouth every 6 (six) hours as needed. 06/17/15   Naiping Donnelly StagerM Xu, MD  insulin aspart (NOVOLOG) 100 UNIT/ML injection Inject 8-12 Units into the skin 3 (three) times daily with meals. Below 200=8 units and every 50 units above she gets one extra unit up to 12 units    Historical Provider, MD  insulin glargine (LANTUS) 100 UNIT/ML injection Inject 30 Units into the skin every morning.     Historical Provider, MD  lisinopril (PRINIVIL,ZESTRIL) 5 MG tablet Take 5 mg by mouth daily.    Historical Provider, MD  meloxicam (MOBIC) 7.5 MG tablet Take 1 tablet (7.5 mg total) by mouth 2 (two) times daily after a meal. Patient not taking: Reported on 06/09/2015 03/11/15   Tod PersiaWinston Parris, MD  metoprolol succinate (TOPROL-XL) 100 MG 24 hr tablet Take 100 mg by mouth daily. Take with or immediately following a meal.    Historical Provider, MD  nystatin (MYCOSTATIN) powder Apply 1 g topically 2 (two) times daily as needed.    Historical Provider, MD  oxymetazoline (AFRIN) 0.05 % nasal spray Place 1 spray into both nostrils 2 (two) times daily as needed.     Historical Provider, MD  rivaroxaban (XARELTO) 20 MG TABS tablet Take 20 mg by mouth daily.    Historical Provider, MD  simvastatin (ZOCOR) 10 MG tablet Take 10 mg by mouth daily.    Historical Provider, MD  zolpidem (AMBIEN) 5 MG tablet Take 5 mg by mouth at bedtime as needed for sleep.    Historical Provider, MD   Dg Chest 1 View  07/17/2015  CLINICAL DATA:  Altered mental status. EXAM: CHEST 1 VIEW COMPARISON:  April 11, 2015. FINDINGS: Stable cardiomegaly. Right lung is clear. No pneumothorax or significant pleural effusion is noted. New airspace opacity is noted in left upper lobe most  consistent with pneumonia. Bony thorax is unremarkable. IMPRESSION: Probable left upper lobe pneumonia. Continued radiographic followup is recommended to ensure resolution. Electronically Signed   By: Lupita RaiderJames  Green Jr, M.D.   On: 07/17/2015 13:25   Dg Wrist 2 Views Left  07/18/2015  CLINICAL DATA:  Persistent wrist pain. Patient fell approximately 6 weeks ago and had subsequent surgery. EXAM: LEFT WRIST - 2 VIEW COMPARISON:  Radiographs 06/09/2015. FINDINGS: No postoperative radiographs available. Patient is status post plate and screw fixation of the distal radial and ulnar fractures. The hardware appears intact. There is persistent impaction of the radial fracture, but no significant displacement. There are irregular  ossific densities surrounding the fractures which may reflect periosteal bone formation or bone grafting. There is osseous resorption around the fracture without gross bone destruction. The carpal bones appear intact. IMPRESSION: Interval ORIF of distal radial and ulnar fractures without progressive displacement. Bone reabsorption around the fractures is noted without gross bone destruction to suggest postoperative infection. Correlate clinically. Electronically Signed   By: Carey Bullocks M.D.   On: 07/18/2015 18:22   Ct Angio Chest Pe W/cm &/or Wo Cm  07/17/2015  CLINICAL DATA:  Dyspnea.  Atrial fibrillation. EXAM: CT ANGIOGRAPHY CHEST WITH CONTRAST TECHNIQUE: Multidetector CT imaging of the chest was performed using the standard protocol during bolus administration of intravenous contrast. Multiplanar CT image reconstructions and MIPs were obtained to evaluate the vascular anatomy. CONTRAST:  80mL OMNIPAQUE IOHEXOL 350 MG/ML SOLN COMPARISON:  04/06/2015 chest CT. Chest radiograph from earlier today. FINDINGS: Mediastinum/Nodes: The study is moderate quality for the evaluation of pulmonary embolism, with evaluation of the subsegmental pulmonary arteries limited by motion artifact. There are no  filling defects in the central, lobar or segmental pulmonary artery branches to suggest acute pulmonary embolism. Great vessels are normal in course and caliber. Mild cardiomegaly . No pericardial fluid/thickening. Left anterior descending, left circumflex and right coronary atherosclerosis. Normal visualized thyroid. There is fluid and debris in the mid thoracic esophagus. No axillary adenopathy. Mildly enlarged 1.0 cm right paratracheal node (series 4/ image 47). Mildly enlarged 1.3 cm subcarinal node (4/62). Mildly enlarged 1.2 cm left infrahilar node (4/66) . Mildly enlarged 1.2 cm right infrahilar node (4/72). Lungs/Pleura: No pneumothorax. No pleural effusion. There is severe patchy consolidation and ground-glass opacity involving most of the left upper lobe including the lingula, with internal air bronchograms and no appreciable central airway stenosis. There is a subsolid 2.1 x 1.2 cm nodule in the posterior right lower lobe (series 6/ image 75), not definitely seen on the prior chest CT. Dependent density throughout both lungs likely represents passive atelectasis. Upper abdomen: Stable simple 6.5 cm renal cyst in the upper left kidney. Musculoskeletal: No aggressive appearing focal osseous lesions. Moderate degenerative changes in the thoracic spine. Partially visualized is posterior spinal fusion hardware in the upper lumbar spine. Review of the MIP images confirms the above findings. IMPRESSION: 1. No evidence of pulmonary embolism, with mild limitations as described. 2. Severe patchy consolidation and ground-glass opacity involving most of the left upper lobe, with internal air bronchograms. New subsolid nodule in the posterior right lower lobe. No appreciable central airway stenosis. These findings are favored to represent a multilobar infectious pneumonia, however an underlying neoplasm is not entirely excluded and a follow-up post treatment chest CT is advised in 6-8 weeks. 3. Mild mediastinal and  bilateral hilar lymphadenopathy, nonspecific. 4. Three-vessel coronary atherosclerosis. Electronically Signed   By: Delbert Phenix M.D.   On: 07/17/2015 16:08   US Venous Img Lower Bilateral  07/18/2015  CLINICAL DATA:  Left greater than right bilateral lower extremity swelling for 1 month. EXAM: BILATERAL LOWER EXTREMITY VENOUS DOPPLER ULTRASOUND TECHNIQUE: Gray-scale sonography with graded compression, as well as color Doppler and duplex ultrasound were performed to evaluate the lower extremity deep venous systems from the level of the common femoral vein and including the common femoral, femoral, profunda femoral, popliteal and calf veins including the posterior tibial, peroneal and gastrocnemius veins when visible. The superficial great saphenous vein was also interrogated. Spectral Doppler was utilized to evaluate flow at rest and with distal augmentation maneuvers in the common femoral, femoral and popliteal  veins. COMPARISON:  04/07/2015 bilateral lower extremity venous Doppler scan. FINDINGS: RIGHT LOWER EXTREMITY Common Femoral Vein: No evidence of thrombus. Normal compressibility, respiratory phasicity and response to augmentation. Saphenofemoral Junction: No evidence of thrombus. Normal compressibility and flow on color Doppler imaging. Profunda Femoral Vein: No evidence of thrombus. Normal compressibility and flow on color Doppler imaging. Femoral Vein: No evidence of thrombus. Normal compressibility, respiratory phasicity and response to augmentation. Popliteal Vein: No evidence of thrombus. Normal compressibility, respiratory phasicity and response to augmentation. Calf Veins: No evidence of thrombus. Normal compressibility and flow on color Doppler imaging. Right peroneal veins could not be visualized, possibly due to technical limitations. Superficial Great Saphenous Vein: No evidence of thrombus. Normal compressibility and flow on color Doppler imaging. Venous Reflux:  None. Other Findings:  None.  LEFT LOWER EXTREMITY Common Femoral Vein: No evidence of thrombus. Normal compressibility, respiratory phasicity and response to augmentation. Saphenofemoral Junction: No evidence of thrombus. Normal compressibility and flow on color Doppler imaging. Profunda Femoral Vein: No evidence of thrombus. Normal compressibility and flow on color Doppler imaging. Femoral Vein: No evidence of thrombus. Normal compressibility, respiratory phasicity and response to augmentation. Popliteal Vein: No evidence of thrombus. Normal compressibility, respiratory phasicity and response to augmentation. Calf Veins: No evidence of thrombus. Normal compressibility and flow on color Doppler imaging. Superficial Great Saphenous Vein: No evidence of thrombus. Normal compressibility and flow on color Doppler imaging. Venous Reflux:  None. Other Findings: There is a 5.7 x 0.9 x 2.7 cm cystic structure in the left popliteal fossa without internal vascularity on Doppler, which measured 3.2 x 1.4 x 2.4 cm on 04/11/2015 and likely represents a Baker's cyst. IMPRESSION: 1. No evidence of deep venous thrombosis in either lower extremity. 2. 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 Electronically Signed   By: Delbert Phenix M.D.   On: 07/18/2015 12:57    Positive ROS: All other systems have been reviewed and were otherwise negative with the exception of those mentioned in the HPI and as above.  Physical Exam: General:  Alert, no acute distress Psychiatric:  Patient is competent for consent with normal mood and affect   Cardiovascular:  No pedal edema Respiratory:  No wheezing, non-labored breathing GI:  Abdomen is soft and non-tender Skin:  No lesions in the area of chief complaint Neurologic:  Sensation intact distally Lymphatic:  No axillary or cervical lymphadenopathy  Orthopedic Exam:  Orthopedic examination is limited to the patient's left wrist and hand. After removal of the splint, the skin is intact.  Her surgical incisions both appeared to be healing well and show no evidence for infection. Mild swelling is noted around the wrist, extending into the hand. There is a retained suture within the ulnar incision that is removed. Wrist motion is limited secondary to pain and apprehension, as well as secondary to her period of immobilization. She is able to actively flex and extend all digits without pain or triggering. She has intact sensation to light touch to all digits, and good capillary refill to all digits.  X-rays:  X-rays of the left wrist are available for review. These films demonstrate excellent interval healing of both fractures with overall satisfactory maintenance of fracture alignment in AP and lateral projections. The hardware remains in excellent position and without evidence of loosening.  Assessment: Status post ORIF of badly comminuted intra-articular left distal radius and ulnar fractures.  Plan: The patient is receiving excellent and appropriate care for her wrist injury. She should continue  to wear her wrist immobilizer at all times, removing it only for bathing purposes. She is encouraged to move her fingers frequently for exercise. She is to follow up with her orthopedic surgeon as scheduled in another 1-2 weeks.  Thank you for ask me to participate in the care of this pleasant woman. Please let me know if I can be of any further assistance.   Maryagnes Amos, MD  Beeper #:  [phone]  07/19/2015 12:51 PM

## 2015-07-19 NOTE — Consult Note (Signed)
PULMONARY / CRITICAL CARE MEDICINE   Name: Allison Shepard: [id number] DOB: 10-11-1933    ADMISSION DATE:  07/17/2015 CONSULTATION DATE:  07/17/15  REFERRING MD :  Dr. Winona LegatoVaickute   CHIEF COMPLAINT:     Altered mental status and hemoptysis   HISTORY OF PRESENT ILLNESS    81 y.o. female with a known history of multiple medical problems including atrial fibrillation, chronic hypertension, hyperlipidemia, obesity diabetes, recent admission for pneumonia in Aug 2016 x 2, presents back to the hospital with complaints of worsening confusion, mild hemoptysis, sob, and suspected LUL PNA.  She was coughing up phlegm with some blood. Patient's daughter was called and patient was brought to emergency room for further evaluation. In emergency room, she was noted to be in A. fib, RVR, heart rate about 150 intermittently. She was given a few doses of Cardizem as well as metoprolol with no significant improvement in her A. fib, RVR, but just dropping her blood pressure. Patient's chest x-ray revealed left upper lobe pneumonia. Labs showed mild renal insufficiency as well as significant leukocytosis.   SUBJECTIVE: Patient awake fully mentating this morning, still with some mild shortness of breath, but overall improvement. Still having some episodes of atrial fibrillation, cardiology is following.  SIGNIFICANT EVENTS   03/16/15 - hospitalized for AECOPD, ?CHF, Afib 04/06/15 - hospitalized for Sepsis (GBS bacteremia), afib with RVR   PAST MEDICAL HISTORY    :  Past Medical History  Diagnosis Date  . COPD (chronic obstructive pulmonary disease) (HCC)   . Atrial fibrillation (HCC)   . Hypertension   . Hypercholesteremia   . Pneumonia   . Diabetes mellitus without complication (HCC)     type 2  . Headache     migraines - years ago  . Complication of anesthesia     difficulty to wake up after an 8 hour back surgery   Past Surgical History  Procedure Laterality Date  . Abdominal  hysterectomy    . Shoulder fusion surgery Right   . Knee surgery Right     knee replacement  . Hip surgery Right     hip replacement  . Back surgery    . Eye surgery      cataract surgery ? eye  . Fracture surgery      right arm 20 years ago  . Colonoscopy      x 3  . Orif distal radius fracture Left 06/17/2015  . Open reduction internal fixation (orif) distal radial fracture Left 06/17/2015    Procedure: OPEN REDUCTION INTERNAL FIXATION LEFT  DISTAL RADIUS AND ULNA FRACTURES;  Surgeon: Allison KosNaiping M Xu, MD;  Location: [facility] OR;  Service: Orthopedics;  Laterality: Left;   Prior to Admission medications   Medication Sig Start Date End Date Taking? Authorizing Provider  albuterol (PROVENTIL HFA;VENTOLIN HFA) 108 (90 BASE) MCG/ACT inhaler Inhale 2 puffs into the lungs every 6 (six) hours as needed for wheezing or shortness of breath.    Historical Provider, MD  albuterol (PROVENTIL) (2.5 MG/3ML) 0.083% nebulizer solution Take 2.5 mg by nebulization every 6 (six) hours as needed for wheezing or shortness of breath.    Historical Provider, MD  Cholecalciferol (VITAMIN D-3) 1000 UNITS CAPS Take 1,000 Units by mouth daily.    Historical Provider, MD  digoxin (LANOXIN) 0.125 MG tablet Take 1 tablet (0.125 mg total) by mouth daily. 04/14/15   Allison SaranSital Mody, MD  diltiazem (TIAZAC) 180 MG 24 hr capsule Take 1 capsule (180 mg total) by mouth daily.  04/14/15   Allison Mody, MD  donepezil (ARICEPT) 5 MG tablet Take 5 mg by mouth at bedtime.    Historical Provider, MD  fluticasone (FLONASE) 50 MCG/ACT nasal spray Place 1 spray into both nostrils daily. 03/20/15   Allison Bilberry, MD  furosemide (LASIX) 40 MG tablet Take 40 mg by mouth daily.    Historical Provider, MD  HYDROcodone-acetaminophen (NORCO) 10-325 MG tablet Take 1-2 tablets by mouth every 6 (six) hours as needed. 06/17/15   Allison Donnelly Stager, MD  insulin aspart (NOVOLOG) 100 UNIT/ML injection Inject 8-12 Units into the skin 3 (three) times daily with meals. Below  200=8 units and every 50 units above she gets one extra unit up to 12 units    Historical Provider, MD  insulin glargine (LANTUS) 100 UNIT/ML injection Inject 30 Units into the skin every morning.     Historical Provider, MD  lisinopril (PRINIVIL,ZESTRIL) 5 MG tablet Take 5 mg by mouth daily.    Historical Provider, MD  meloxicam (MOBIC) 7.5 MG tablet Take 1 tablet (7.5 mg total) by mouth 2 (two) times daily after a meal. Patient not taking: Reported on 06/09/2015 03/11/15   Allison Persia, MD  metoprolol succinate (TOPROL-XL) 100 MG 24 hr tablet Take 100 mg by mouth daily. Take with or immediately following a meal.    Historical Provider, MD  nystatin (MYCOSTATIN) powder Apply 1 g topically 2 (two) times daily as needed.    Historical Provider, MD  oxymetazoline (AFRIN) 0.05 % nasal spray Place 1 spray into both nostrils 2 (two) times daily.    Historical Provider, MD  oxymetazoline (VICKS SINEX) 0.05 % nasal spray Place 1 spray into both nostrils 2 (two) times daily.    Historical Provider, MD  rivaroxaban (XARELTO) 20 MG TABS tablet Take 20 mg by mouth daily.    Historical Provider, MD  simvastatin (ZOCOR) 10 MG tablet Take 10 mg by mouth daily.    Historical Provider, MD  zolpidem (AMBIEN) 5 MG tablet Take 5 mg by mouth at bedtime as needed for sleep.    Historical Provider, MD   Allergies  Allergen Reactions  . Hydromorphone Hcl Nausea And Vomiting  . Ibuprofen Other (See Comments)    Pt was told by her MD not to take this medication.    . Mucinex [Guaifenesin Er] Other (See Comments)    Red all over. Felt like i was on fire  . Percocet [Oxycodone-Acetaminophen] Hives     FAMILY HISTORY   Family History  Problem Relation Age of Onset  . Heart disease Father       SOCIAL HISTORY    reports that she quit smoking about 23 years ago. She has never used smokeless tobacco. She reports that she drinks about 8.4 oz of alcohol per week. She reports that she does not use illicit  drugs.  Review of Systems  Constitutional: Negative for fever and chills.  Eyes: Negative for blurred vision, double vision and photophobia.  Respiratory: Positive for cough, sputum production and shortness of breath. Negative for hemoptysis and wheezing.        Improving shortness of breath  Gastrointestinal: Negative for nausea, vomiting, abdominal pain and diarrhea.  Genitourinary: Negative for dysuria and urgency.  Musculoskeletal: Negative for myalgias.  Skin: Positive for rash.  Neurological: Negative for dizziness and loss of consciousness.  Endo/Heme/Allergies: Does not bruise/bleed easily.  Psychiatric/Behavioral: Negative for hallucinations.      VITAL SIGNS    Temp:  [98.2 F (36.8 C)-98.3 F (36.8  C)] 98.2 F (36.8 C) (12/03 2000) Pulse Rate:  [56-150] 125 (12/04 0700) Resp:  [16-28] 28 (12/04 0700) BP: (106-165)/(57-130) 144/101 mmHg (12/04 0700) SpO2:  [88 %-100 %] 97 % (12/04 0700) HEMODYNAMICS:   VENTILATOR SETTINGS:   INTAKE / OUTPUT:  Intake/Output Summary (Last 24 hours) at 07/19/15 1610 Last data filed at 07/19/15 0600  Gross per 24 hour  Intake 2683.7 ml  Output    352 ml  Net 2331.7 ml       PHYSICAL EXAM   Physical Exam  Constitutional: She appears well-developed and well-nourished.  HENT:  Head: Normocephalic and atraumatic.  Right Ear: External ear normal.  Left Ear: External ear normal.  Mouth/Throat: Oropharynx is clear and moist.  Eyes: Pupils are equal, round, and reactive to light.  Neck: Normal range of motion. Neck supple. No thyromegaly present.  Cardiovascular: Intact distal pulses.  Exam reveals no gallop and no friction rub.   No murmur heard. Tachycardia with irregular beat  Pulmonary/Chest: She has no wheezes. She has no rales.  Shallow BS. Improving Dec BS on the LUL   Abdominal: Soft. Bowel sounds are normal. She exhibits no distension. There is no tenderness. There is no rebound.  Musculoskeletal: Normal range  of motion. She exhibits tenderness.  Neurological: She is alert.  Awake but confused. Orient to place only.   Skin: Skin is warm and dry.  +2 LE B/L Edema Venous stasis in B/L   Nursing note and vitals reviewed.      LABS   LABS:  CBC  Recent Labs Lab 07/17/15 1158 07/18/15 0428 07/19/15 0513  WBC 18.4* 11.6* 7.5  HGB 13.8 10.6* 10.7*  HCT 42.7 32.4* 32.2*  PLT 250 168 173   Coag's  Recent Labs Lab 07/17/15 1158  INR 2.24   BMET  Recent Labs Lab 07/17/15 1250 07/18/15 0428 07/19/15 0513  NA 137 136 136  K 3.9 4.1 4.3  CL 104 106 107  CO2 BUN 22* 20 21*  CREATININE 1.04* 1.13* 1.18*  GLUCOSE 144* 125* 156*   Electrolytes  Recent Labs Lab 07/17/15 1250 07/18/15 0428 07/19/15 0513  CALCIUM 8.2* 7.9* 8.1*  MG 1.7  --  1.8   Sepsis Markers  Recent Labs Lab 07/17/15 1250 07/17/15 1615 07/18/15 0428 07/19/15 0513  LATICACIDVEN 1.8 1.9  --   --   PROCALCITON 0.14  --  1.91 1.06   ABG No results for input(s): PHART, PCO2ART, PO2ART in the last 168 hours. Liver Enzymes  Recent Labs Lab 07/17/15 1250 07/19/15 0513  AST 15 12*  ALT 15 12*  ALKPHOS 101 80  BILITOT 0.5 0.7  ALBUMIN 3.1* 2.6*   Cardiac Enzymes  Recent Labs Lab 07/17/15 1250  TROPONINI 0.03   Glucose  Recent Labs Lab 07/18/15 0811 07/18/15 1122 07/18/15 1603 07/18/15 1933 07/18/15 2341 07/19/15 0723  GLUCAP 122* 167* 121* 173* 177* 156*     Recent Results (from the past 240 hour(s))  Blood culture (routine x 2)     Status: None (Preliminary result)   Collection Time: 07/17/15 11:58 AM  Result Value Ref Range Status   Specimen Description BLOOD RIGHT ASSIST CONTROL  Final   Special Requests BAA,5ML,AER,ANA  Final   Culture NO GROWTH 2 DAYS  Final   Report Status PENDING  Incomplete  Blood culture (routine x 2)     Status: None (Preliminary result)   Collection Time: 07/17/15 11:58 AM  Result Value Ref Range Status  Specimen Description  BLOOD LEFT ASSIST CONTROL  Final   Special Requests BAA,5ML,ANA,AER  Final   Culture  Setup Time   Final    GRAM POSITIVE COCCI AEROBIC BOTTLE ONLY CRITICAL RESULT CALLED TO, READ BACK BY AND VERIFIED WITH: MICHELLE WILLIAMS AT 1610 ON 07/18/15 [facility] CONFIRMED BY [facility]    Culture   Final    GRAM POSITIVE COCCI AEROBIC BOTTLE ONLY IDENTIFICATION TO FOLLOW    Report Status PENDING  Incomplete  MRSA PCR Screening     Status: None   Collection Time: 07/17/15  6:03 PM  Result Value Ref Range Status   MRSA by PCR NEGATIVE NEGATIVE Final    Comment:        The GeneXpert MRSA Assay (FDA approved for NASAL specimens only), is one component of a comprehensive MRSA colonization surveillance program. It is not intended to diagnose MRSA infection nor to guide or monitor treatment for MRSA infections.      Current facility-administered medications:  .  0.9 % NaCl with KCl 20 mEq/ L  infusion, , Intravenous, Continuous, Katharina Caper, MD, Last Rate: 100 mL/hr at 07/18/15 2338 .  acetaminophen (TYLENOL) tablet 650 mg, 650 mg, Oral, Q6H PRN **OR** acetaminophen (TYLENOL) suppository 650 mg, 650 mg, Rectal, Q6H PRN, Katharina Caper, MD .  amiodarone (PACERONE) tablet 400 mg, 400 mg, Oral, BID, Dalia Heading, MD .  antiseptic oral rinse (CPC / CETYLPYRIDINIUM CHLORIDE 0.05%) solution 7 mL, 7 mL, Mouth Rinse, BID, Katharina Caper, MD, 7 mL at 07/19/15 0850 .  digoxin (LANOXIN) tablet 0.125 mg, 0.125 mg, Oral, Daily, Katharina Caper, MD, 0.125 mg at 07/19/15 0848 .  donepezil (ARICEPT) tablet 5 mg, 5 mg, Oral, QHS, Katharina Caper, MD, 5 mg at 07/18/15 2215 .  fluticasone (FLONASE) 50 MCG/ACT nasal spray 1 spray, 1 spray, Each Nare, Daily, Katharina Caper, MD, 1 spray at 07/19/15 0847 .  furosemide (LASIX) tablet 40 mg, 40 mg, Oral, Daily, Katharina Caper, MD, 40 mg at 07/19/15 0848 .  insulin aspart (novoLOG) injection 0-9 Units, 0-9 Units, Subcutaneous, TID WC, Stephanie Acre, MD, 2 Units at 07/19/15 0849 .   insulin glargine (LANTUS) injection 30 Units, 30 Units, Subcutaneous, q morning - 10a, Katharina Caper, MD, 30 Units at 07/19/15 0849 .  levalbuterol (XOPENEX) nebulizer solution 1.25 mg, 1.25 mg, Nebulization, Q4H PRN, Harlem Bula, MD .  levofloxacin (LEVAQUIN) IVPB 750 mg, 750 mg, Intravenous, Q24H, Damaso Laday, MD, 750 mg at 07/18/15 1140 .  nystatin (MYCOSTATIN) powder 1 g, 1 g, Topical, BID, Katharina Caper, MD, 1 g at 07/19/15 0847 .  ondansetron (ZOFRAN) tablet 4 mg, 4 mg, Oral, Q6H PRN **OR** ondansetron (ZOFRAN) injection 4 mg, 4 mg, Intravenous, Q6H PRN, Katharina Caper, MD .  oxymetazoline (AFRIN) 0.05 % nasal spray 1 spray, 1 spray, Each Nare, BID PRN, Katharina Caper, MD, 1 spray at 07/18/15 2317 .  phenazopyridine (PYRIDIUM) tablet 100 mg, 100 mg, Oral, TID WC, Arnaldo Natal, MD, 100 mg at 07/19/15 0848 .  piperacillin-tazobactam (ZOSYN) IVPB 3.375 g, 3.375 g, Intravenous, 3 times per day, Ramonita Lab, MD .  rivaroxaban (XARELTO) tablet 20 mg, 20 mg, Oral, Daily, Ramonita Lab, MD, 20 mg at 07/19/15 0858 .  simvastatin (ZOCOR) tablet 10 mg, 10 mg, Oral, Daily, Katharina Caper, MD, 10 mg at 07/19/15 0848 .  sodium chloride 0.9 % injection 3 mL, 3 mL, Intravenous, Q12H, Katharina Caper, MD, 3 mL at 07/19/15 1000 .  vancomycin (VANCOCIN) 1,250 mg in sodium chloride 0.9 % 250  mL IVPB, 1,250 mg, Intravenous, Q18H, Karl Ito, MD, 1,250 mg at 07/19/15 1610  IMAGING    Dg Wrist 2 Views Left  07/18/2015  CLINICAL DATA:  Persistent wrist pain. Patient fell approximately 6 weeks ago and had subsequent surgery. EXAM: LEFT WRIST - 2 VIEW COMPARISON:  Radiographs 06/09/2015. FINDINGS: No postoperative radiographs available. Patient is status post plate and screw fixation of the distal radial and ulnar fractures. The hardware appears intact. There is persistent impaction of the radial fracture, but no significant displacement. There are irregular ossific densities surrounding the fractures which may  reflect periosteal bone formation or bone grafting. There is osseous resorption around the fracture without gross bone destruction. The carpal bones appear intact. IMPRESSION: Interval ORIF of distal radial and ulnar fractures without progressive displacement. Bone reabsorption around the fractures is noted without gross bone destruction to suggest postoperative infection. Correlate clinically. Electronically Signed   By: Carey Bullocks M.D.   On: 07/18/2015 18:22   US Venous Img Lower Bilateral  07/18/2015  CLINICAL DATA:  Left greater than right bilateral lower extremity swelling for 1 month. EXAM: BILATERAL LOWER EXTREMITY VENOUS DOPPLER ULTRASOUND TECHNIQUE: Gray-scale sonography with graded compression, as well as color Doppler and duplex ultrasound were performed to evaluate the lower extremity deep venous systems from the level of the common femoral vein and including the common femoral, femoral, profunda femoral, popliteal and calf veins including the posterior tibial, peroneal and gastrocnemius veins when visible. The superficial great saphenous vein was also interrogated. Spectral Doppler was utilized to evaluate flow at rest and with distal augmentation maneuvers in the common femoral, femoral and popliteal veins. COMPARISON:  04/07/2015 bilateral lower extremity venous Doppler scan. FINDINGS: RIGHT LOWER EXTREMITY Common Femoral Vein: No evidence of thrombus. Normal compressibility, respiratory phasicity and response to augmentation. Saphenofemoral Junction: No evidence of thrombus. Normal compressibility and flow on color Doppler imaging. Profunda Femoral Vein: No evidence of thrombus. Normal compressibility and flow on color Doppler imaging. Femoral Vein: No evidence of thrombus. Normal compressibility, respiratory phasicity and response to augmentation. Popliteal Vein: No evidence of thrombus. Normal compressibility, respiratory phasicity and response to augmentation. Calf Veins: No evidence of  thrombus. Normal compressibility and flow on color Doppler imaging. Right peroneal veins could not be visualized, possibly due to technical limitations. Superficial Great Saphenous Vein: No evidence of thrombus. Normal compressibility and flow on color Doppler imaging. Venous Reflux:  None. Other Findings:  None. LEFT LOWER EXTREMITY Common Femoral Vein: No evidence of thrombus. Normal compressibility, respiratory phasicity and response to augmentation. Saphenofemoral Junction: No evidence of thrombus. Normal compressibility and flow on color Doppler imaging. Profunda Femoral Vein: No evidence of thrombus. Normal compressibility and flow on color Doppler imaging. Femoral Vein: No evidence of thrombus. Normal compressibility, respiratory phasicity and response to augmentation. Popliteal Vein: No evidence of thrombus. Normal compressibility, respiratory phasicity and response to augmentation. Calf Veins: No evidence of thrombus. Normal compressibility and flow on color Doppler imaging. Superficial Great Saphenous Vein: No evidence of thrombus. Normal compressibility and flow on color Doppler imaging. Venous Reflux:  None. Other Findings: There is a 5.7 x 0.9 x 2.7 cm cystic structure in the left popliteal fossa without internal vascularity on Doppler, which measured 3.2 x 1.4 x 2.4 cm on 04/11/2015 and likely represents a Baker's cyst. IMPRESSION: 1. No evidence of deep venous thrombosis in either lower extremity. 2. 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 Electronically Signed   By: Jannifer Rodney.D.  On: 07/18/2015 12:57      Indwelling Urinary Catheter continued, requirement due to   Reason to continue Indwelling Urinary Catheter for strict Intake/Output monitoring for hemodynamic instability   Central Line continued, requirement due to   Reason to continue [employer] Monitoring of central venous pressure or other hemodynamic parameters   Ventilator continued, requirement  due to, resp failure    Ventilator Sedation RASS 0 to -2   Cultures: BCx2 12/2>>GPC 1/2 UC  Sputum 12/2>>  Antibiotics: Levaquin 12/2>> Zosyn 12/2>> Vanc 12/2>>  Lines:   ASSESSMENT/PLAN   81 year old female past medical history of COPD, atrial fibrillation with RVR, group B strep bacteremia 03/2015, had omitted for multifocal pneumonia with hypoxia, mainly in the left upper lobe.  Sepsis - due to multilobar pneumonia - LUL -now a great clinical improvement - cont with levaquin and zosyn, treating HCAP, vanc started 12/3 - monitor cbc - maintain MAP>65 - IVF as tolerated - monitor UOP - respiratory and blood cultures - CT chest with no PE, but significant LUL PNA - stable to transfer to medical floor - Repeat CAT scan in 2-3 months after discharge for follow-up of left upper lobe pneumonia. This can be done by primary care physician or her pulmonologist Dr. Mayo Ao.  Acute respiratory failure with hypoxia.  - maintain O2 sats >88% - wears up 2L [place] at home - may use HFNC if needed - scheduled xopenox for the next 3 days - ICS  Left upper lobe pneumonia - currently started on IV abx - f/u resp and bld cx  Hemoptysis - CTA neg for PE - secondary to LUL PNA - ok to cont with anticoagulation for afib - monitor Plts and H\H  A. Fib with RVR - most likely 2/2 LUL PNA and sepsis - still with afib in the 110s, but with stable BP - cont with dig and cardizem as BP tolerates - Cardiology following appreciate recommendations-wean off IV amiodarone and switch to by mouth amiodarone  Hypotension - 2/2 to sepsis and afib with RVR - now resolving - hold beta blockers - cont with IVFs, gentle - IV Abx - monitor UOP  Renal insufficiency - chronic - will f/u Cr and GFR - renal following   Thank you for consulting [facility],  Please feel free to contacts [place] with any questions at [phone] (please enter 7-digits)  I have personally obtained a  history, examined the patient, evaluated laboratory and imaging results, formulated the assessment and plan and placed orders.  Pulmonary Consult time -  Stephanie Acre, MD [facility] Pager [phone] (please enter 7-digits) On Call Pager - [phone] (please enter 7-digits)   07/19/2015, 9:38 AM  Note: This note was prepared with Dragon dictation along with smaller phrase technology. Any transcriptional errors that result from this process are unintentional.

## 2015-07-19 NOTE — Progress Notes (Signed)
Patient transferred from CCU. Set up in room. No complaints of pain. Running Afib on tele in the 120's. Patient has been moving around, given in report patient's heart rate is high with exertion. No needs at this time.

## 2015-07-20 LAB — GLUCOSE, CAPILLARY
GLUCOSE-CAPILLARY: 134 mg/dL — AB (ref 65–99)
Glucose-Capillary: 208 mg/dL — ABNORMAL HIGH (ref 65–99)

## 2015-07-20 LAB — BASIC METABOLIC PANEL
ANION GAP: 5 (ref 5–15)
BUN: 21 mg/dL — AB (ref 6–20)
CALCIUM: 8.8 mg/dL — AB (ref 8.9–10.3)
CO2: 27 mmol/L (ref 22–32)
Chloride: 107 mmol/L (ref 101–111)
Creatinine, Ser: 1.09 mg/dL — ABNORMAL HIGH (ref 0.44–1.00)
GFR calc Af Amer: 54 mL/min — ABNORMAL LOW (ref >60)
GFR, EST NON AFRICAN AMERICAN: 47 mL/min — AB (ref >60)
GLUCOSE: 135 mg/dL — AB (ref 65–99)
Potassium: 3.9 mmol/L (ref 3.5–5.1)
Sodium: 139 mmol/L (ref 135–145)

## 2015-07-20 MED ORDER — LISINOPRIL 10 MG PO TABS: 10.0000 mg | ORAL_TABLET | Freq: Every day | ORAL | Status: DC

## 2015-07-20 MED ORDER — AMIODARONE HCL 400 MG PO TABS: 400.0000 mg | ORAL_TABLET | Freq: Two times a day (BID) | ORAL | Status: DC

## 2015-07-20 MED ORDER — LISINOPRIL 10 MG PO TABS
10.0000 mg | ORAL_TABLET | Freq: Every day | ORAL | Status: DC
  Administered 2015-07-20: 10 mg via ORAL
  Filled 2015-07-20: qty 1

## 2015-07-20 MED ORDER — LEVOFLOXACIN 500 MG PO TABS
500.0000 mg | ORAL_TABLET | Freq: Every day | ORAL | Status: AC
Start: 2015-07-20 — End: 2015-07-25

## 2015-07-20 MED ORDER — LEVOFLOXACIN IN D5W 750 MG/150ML IV SOLN
750.0000 mg | INTRAVENOUS | Status: DC
  Filled 2015-07-20: qty 150

## 2015-07-20 NOTE — Discharge Instructions (Signed)
Activity as tolerated  Continue oxygen via nasal cannula Diet-healthy heart Follow-up with primary care physician in a week Follow-up with orthopedics regarding the  left wrist fracture as recorded by ortho Follow-up with cardiology Dr. Lady Garyfath  in 3 days

## 2015-07-20 NOTE — Care Management Important Message (Signed)
Important Message  Patient Details  Name: Allison Shepard MRN: [id number] Date of Birth: Oct 26, 1933   Medicare Important Message Given:  Yes    Fatima Fedie A, RN 07/20/2015, 9:49 AM

## 2015-07-20 NOTE — Progress Notes (Signed)
[facility]  Patient Status: active  [facility] is providing the following services: PT  If patient discharges after hours, please call [phone].   Dimple CaseyJason E Hinton 07/20/2015, 11:51 AM

## 2015-07-20 NOTE — Progress Notes (Signed)
A & O. Takes meds ok. IV and tele removed. Discharge instructions given to pt. Husband to take pt home. Discharge instuctions given to pt. Pt has no further concerns at this time.

## 2015-07-20 NOTE — Care Management Note (Signed)
Case Management Note  Patient Details  Name: Allison Shepard MRN: [id number] Date of Birth: 16-Nov-1933  Subjective/Objective:      81yo Mrs Allison Shepard was admitted 07/17/15 per [facility] due to Sepsis/Pneumonia. Hx: COPD, a-Fib, HTN, IDDM. Takes chronic Xaralto. Chronic 3L N/C at home but not remember the name of the equipment supplier. Resides with her husband who drives her to appointments. PCP=Dr Thies. Pharmacy=[facility] on [address] No home equipment except a handicap shower. Is wearing a left wrist velcro immobilizer following a left wrist ORIF on 06/09/15. [name] is currently receiving home health PT by [facility] and wishes to resume that service after hospital discharge. Feliberto GottronJason Hinton at [facility] was updated about need to resume PT orders. Case Management will follow for discharge planning. .            Action/Plan:   Expected Discharge Date:                  Expected Discharge Plan:     In-House Referral:     Discharge planning Services     Post Acute Care Choice:    Choice offered to:     DME Arranged:    DME Agency:     HH Arranged:    HH Agency:     Status of Service:     Medicare Important Message Given:    Date Medicare IM Given:    Medicare IM give by:    Date Additional Medicare IM Given:    Additional Medicare Important Message give by:     If discussed at Long Length of Stay Meetings, dates discussed:    Additional Comments:  Kristianna Saperstein A, RN 07/20/2015, 9:37 AM

## 2015-07-20 NOTE — Care Management Note (Signed)
Case Management Note  Patient Details  Name: Allison Shepard MRN: [id number] Date of Birth: 02/20/1934  Subjective/Objective:     Call to Allison Shepard at [facility] to add RN home health and to resume home health PT. Allison [facility] at [facility] Allison bring Shepard portable oxygen tank to Allison Shepard's room 258 today. Allison Shepard already has an oxygen concentrator at home supplied by [facility].                Action/Plan:   Expected Discharge Date:                  Expected Discharge Plan:     In-House Referral:     Discharge planning Services     Post Acute Care Choice:    Choice offered to:     DME Arranged:    DME Agency:     HH Arranged:    HH Agency:     Status of Service:     Medicare Important Message Given:  Yes Date Medicare IM Given:    Medicare IM give by:    Date Additional Medicare IM Given:    Additional Medicare Important Message give by:     If discussed at Long Length of Stay Meetings, dates discussed:    Additional Comments:  Allison Neto A, RN 07/20/2015, 1:53 PM

## 2015-07-20 NOTE — Evaluation (Signed)
Physical Therapy Evaluation Patient Details Name: Allison Shepard MRN: [id number] DOB: 03-Feb-1934 Today's Date: 07/20/2015   History of Present Illness  81 yo female with onset of radial fracture on LUE with surgical fixation recently now has PNA.  On 3L O2 at home with chronic a-fib, now uncontrolled with rates up to 150 wiht short walking trips.  PMHx: COPD, a-fib, DM, knee and hip replacements  Clinical Impression  Pt was agitated during her visit, with rushing to get to BR and removing O2.  Talked with her about slowing down and noted HR was as high as 150 with short walk to BR, had to bring O2 with her to replace in BR.  Impulsive and insistent that she go home today, although concern is about HR being so high with minimal gait distance.  Will ask HHPT to follow up as pt is leaving today.    Follow Up Recommendations Home health PT;Supervision/Assistance - 24 hour    Equipment Recommendations  None recommended by PT (has hemiwalker and SPC)    Recommendations for Other Services Rehab consult     Precautions / Restrictions Precautions Precautions: Fall (check heart rates with activity) Precaution Comments: Pt removed O2 and stood quickly to get to BR but had to quickly replace it as she is SOB with fatigue to walk just to BR Restrictions Weight Bearing Restrictions: No      Mobility  Bed Mobility Overal bed mobility: Needs Assistance Bed Mobility: Supine to Sit;Sit to Supine     Supine to sit: Min assist (to assist trunk without using LUE) Sit to supine: Min assist (for elevating legs onto bed)   General bed mobility comments: multiple reminders not to WB through LUE hand due to radial fracture  Transfers Overall transfer level: Needs assistance Equipment used: Rolling walker (2 wheeled);1 person hand held assist Transfers: Sit to/from [employer] Sit to Stand: Min assist (from commode, help with cleaning and clothing) Stand pivot transfers: Min  assist;From elevated surface (cues for safety and set up to bed and chair)          Ambulation/Gait Ambulation/Gait assistance: Min assist (continual reminders not to use LUE on IV pole bar) Ambulation Distance (Feet): 25 Feet Assistive device: 1 person hand held assist (IV pole as pt declined to use [facility]) Gait Pattern/deviations: Step-to pattern;Step-through pattern;Shuffle;Wide base of support;Drifts right/left (unsteady and too fast for her ability to control IV and bala) Gait velocity: reduced Gait velocity interpretation: at or above normal speed for age/gender General Gait Details: unsafe mobility on IV pole, declined to use SPC and was not in control of balance with IV  Stairs            Wheelchair Mobility    Modified Rankin (Stroke Patients Only)       Balance Overall balance assessment: Needs assistance Sitting-balance support: Feet supported Sitting balance-Leahy Scale: Good   Postural control: Posterior lean Standing balance support: Bilateral upper extremity supported (despite PT asking pt not to use LUE) Standing balance-Leahy Scale: Fair Standing balance comment: fair- dynamic                             Pertinent Vitals/Pain Pain Assessment: No/denies pain    Home Living Family/patient expects to be discharged to:: Private residence Living Arrangements: Spouse/significant other Available Help at Discharge: Family;Available 24 hours/day Type of Home: House Home Access: Stairs to enter Entrance Stairs-Rails: None Entrance Stairs-Number of Steps: 2 Home  Layout: One level Home Equipment: Toilet riser;Shower seat - built in;Walker - 4 wheels;Bedside commode;Cane - single point Additional Comments: O2 3L at home, has uncontrolled HR per pt    Prior Function Level of Independence: Independent with assistive device(s)         Comments: canot use RW due to L wrist fracture     Hand Dominance   Dominant Hand: Right     Extremity/Trunk Assessment   Upper Extremity Assessment: Generalized weakness           Lower Extremity Assessment: Generalized weakness      Cervical / Trunk Assessment: Normal  Communication   Communication: No difficulties  Cognition Arousal/Alertness: Awake/alert Behavior During Therapy: Impulsive;Agitated Overall Cognitive Status: No family/caregiver present to determine baseline cognitive functioning       Memory: Decreased recall of precautions (not sure if pt cannot remember instructions or if she ignore)              General Comments      Exercises        Assessment/Plan    PT Assessment Patient needs continued PT services  PT Diagnosis Generalized weakness;Altered mental status   PT Problem List Decreased strength;Decreased range of motion;Decreased activity tolerance;Decreased balance;Decreased mobility;Decreased coordination;Decreased cognition;Decreased knowledge of use of DME;Decreased safety awareness;Decreased knowledge of precautions;Cardiopulmonary status limiting activity;Obesity;Decreased skin integrity  PT Treatment Interventions DME instruction;Gait training;Stair training;Functional mobility training;Therapeutic activities;Therapeutic exercise;Balance training;Cognitive remediation;Neuromuscular re-education;Patient/family education   PT Goals (Current goals can be found in the Care Plan section) Acute Rehab PT Goals Patient Stated Goal: to go home today PT Goal Formulation: With patient Time For Goal Achievement: 08/03/15 Potential to Achieve Goals: Good    Frequency Min 2X/week   Barriers to discharge Other (comment) (not sure what her husbands' ability to assist will be)      Co-evaluation               End of Session Equipment Utilized During Treatment: Gait belt;Oxygen Activity Tolerance: Patient tolerated treatment well;Treatment limited secondary to medical complications (Comment) (HR up to 150 with short walk to  BR) Patient left: in bed;with call bell/phone within reach;with bed alarm set;with nursing/sitter in room Nurse Communication: Mobility status         Time: [id number] PT Time Calculation (min) (ACUTE ONLY): 35 min   Charges:   PT Evaluation $Initial PT Evaluation Tier I: 1 Procedure PT Treatments $Gait Training: 8-22 mins   PT G CodesIvar Shepard:        Allison Shepard 07/20/2015, 1:35 PM   Allison Shepard, PT MS Acute Rehab Dept. Number: [phone] [phone] and [facility] [phone]

## 2015-07-20 NOTE — Progress Notes (Signed)
[facility]  SUBJECTIVE: feels good. Wants to go home   Filed Vitals:   07/19/15 1850 07/20/15 0503 07/20/15 0800 07/20/15 1124  BP: 104/47 158/91  142/85  Pulse: 114 131 114 113  Temp: 98.1 F (36.7 C) 98 F (36.7 C)  98.9 F (37.2 C)  TempSrc: Oral Oral    Resp:  20  22  Height:      Weight:      SpO2: 96% 96%  93%    Intake/Output Summary (Last 24 hours) at 07/20/15 1307 Last data filed at 07/20/15 1303  Gross per 24 hour  Intake    590 ml  Output   3000 ml  Net  -2410 ml    LABS: Basic Metabolic Panel:  Recent Labs  56/38/7510/11/29 0513 07/20/15 0413  NA 136 139  K 4.3 3.9  CL 107 107  CO2 26 27  GLUCOSE 156* 135*  BUN 21* 21*  CREATININE 1.18* 1.09*  CALCIUM 8.1* 8.8*  MG 1.8  --    Liver Function Tests:  Recent Labs  07/19/15 0513  AST 12*  ALT 12*  ALKPHOS 80  BILITOT 0.7  PROT 5.7*  ALBUMIN 2.6*   No results for input(s): LIPASE, AMYLASE in the last 72 hours. CBC:  Recent Labs  07/18/15 0428 07/19/15 0513  WBC 11.6* 7.5  HGB 10.6* 10.7*  HCT 32.4* 32.2*  MCV 98.8 98.3  PLT 168 173   Cardiac Enzymes: No results for input(s): CKTOTAL, CKMB, CKMBINDEX, TROPONINI in the last 72 hours. BNP: Invalid input(s): POCBNP D-Dimer: No results for input(s): DDIMER in the last 72 hours. Hemoglobin A1C: No results for input(s): HGBA1C in the last 72 hours. Fasting Lipid Panel: No results for input(s): CHOL, HDL, LDLCALC, TRIG, CHOLHDL, LDLDIRECT in the last 72 hours. Thyroid Function Tests: No results for input(s): TSH, T4TOTAL, T3FREE, THYROIDAB in the last 72 hours.  Invalid input(s): FREET3 Anemia Panel: No results for input(s): VITAMINB12, FOLATE, FERRITIN, TIBC, IRON, RETICCTPCT in the last 72 hours.   Physical Exam: Blood pressure 142/85, pulse 113, temperature 98.9 F (37.2 C), temperature source Oral, resp. rate 22, height 5\' 5"  (1.651 m), weight 109.3 kg (240 lb 15.4 oz), SpO2 93 %.   General  appearance: alert and cooperative Resp: rhonchi bibasilar Chest wall: no tenderness Cardio: irregularly irregular rhythm GI: soft, non-tender; bowel sounds normal; no masses,  no organomegaly Neurologic: Grossly normal  TELEMETRY: Reviewed telemetry pt in afib with variable and occasionally rapid vr  ASSESSMENT AND PLAN:  Active Problems:   Pneumonia-on abx. Improved clinically. Continue with po abx   Acute respiratory failure with hypoxia (HCC)   Left upper lobe pneumonia   Hemoptysis   Atrial fibrillation with RVR (HCC)-rate improved. Still occaisionally rapid vr but in failry good control. Would continue with amiodarone 400 bid and digoxin 0.125 mg daily . Xarelto for anticoagulation. Rate is somewhat variable but she is stable with this and has had this variability previously. OK for discharge on amiodarone and digoxin at current levels. Will see her later this week as an outpatient.    Hypotension   Renal insufficiency   Obesity   Metabolic encephalopathy    Dalia HeadingFATH,Errik Mitchelle A., MD, [facility] HospitalFACC 07/20/2015 1:07 PM

## 2015-07-20 NOTE — Progress Notes (Signed)
[facility]  ROUNDING NOTE   Subjective:   Patient sitting at end of bed. Breathing comfortably. Denies any cough.   Objective:  Vital signs in last 24 hours:  Temp:  [98 F (36.7 C)-98.3 F (36.8 C)] 98 F (36.7 C) (12/05 0503) Pulse Rate:  [80-131] 114 (12/05 0800) Resp:  [17-24] 20 (12/05 0503) BP: (104-158)/(47-121) 158/91 mmHg (12/05 0503) SpO2:  [91 %-98 %] 96 % (12/05 0503)  Weight change:  Filed Weights   07/17/15 1649 07/17/15 1729  Weight: 99.791 kg (220 lb) 109.3 kg (240 lb 15.4 oz)    Intake/Output: I/O last 3 completed shifts: In: 2596.7 [P.O.:780; I.V.:1516.7; IV Piggyback:300] Out: 1103 [Urine:1103]   Intake/Output this shift:     Physical Exam: General: NAD, sitting at end of bed  Head: Normocephalic, atraumatic. Moist oral mucosal membranes  Eyes: Anicteric  Neck: Supple, trachea midline  Lungs:  Basilar crackles bilaterally  Heart: Irregular tachycardic  Abdomen:  Soft, nontender, BS present  Extremities: trace peripheral edema.  Neurologic: Nonfocal, moving all four extremities  Skin: No lesions       Basic Metabolic Panel:  Recent Labs Lab 07/17/15 1250 07/18/15 0428 07/19/15 0513 07/20/15 0413  NA 137 136 136 139  K 3.9 4.1 4.3 3.9  CL 104 106 107 107  CO2 26 26 26 27   GLUCOSE 144* 125* 156* 135*  BUN 22* 20 21* 21*  CREATININE 1.04* 1.13* 1.18* 1.09*  CALCIUM 8.2* 7.9* 8.1* 8.8*  MG 1.7  --  1.8  --     Liver Function Tests:  Recent Labs Lab 07/17/15 1250 07/19/15 0513  AST 15 12*  ALT 15 12*  ALKPHOS 101 80  BILITOT 0.5 0.7  PROT 6.0* 5.7*  ALBUMIN 3.1* 2.6*   No results for input(s): LIPASE, AMYLASE in the last 168 hours. No results for input(s): AMMONIA in the last 168 hours.  CBC:  Recent Labs Lab 07/17/15 1158 07/18/15 0428 07/19/15 0513  WBC 18.4* 11.6* 7.5  NEUTROABS 16.2*  --   --   HGB 13.8 10.6* 10.7*  HCT 42.7 32.4* 32.2*  MCV 99.1 98.8 98.3  PLT 250 168 173    Cardiac  Enzymes:  Recent Labs Lab 07/17/15 1250  TROPONINI 0.03    BNP: Invalid input(s): POCBNP  CBG:  Recent Labs Lab 07/19/15 0723 07/19/15 1108 07/19/15 1606 07/19/15 2058 07/20/15 0734  GLUCAP 156* 199* 172* 163* 134*    Microbiology: Results for orders placed or performed during the hospital encounter of 07/17/15  Blood culture (routine x 2)     Status: None (Preliminary result)   Collection Time: 07/17/15 11:58 AM  Result Value Ref Range Status   Specimen Description BLOOD RIGHT ASSIST CONTROL  Final   Special Requests BAA,5ML,AER,ANA  Final   Culture NO GROWTH 3 DAYS  Final   Report Status PENDING  Incomplete  Blood culture (routine x 2)     Status: None (Preliminary result)   Collection Time: 07/17/15 11:58 AM  Result Value Ref Range Status   Specimen Description BLOOD LEFT ASSIST CONTROL  Final   Special Requests BAA,5ML,ANA,AER  Final   Culture  Setup Time   Final    GRAM POSITIVE COCCI AEROBIC BOTTLE ONLY CRITICAL RESULT CALLED TO, READ BACK BY AND VERIFIED WITH: MICHELLE WILLIAMS AT 0356 ON 07/18/15 [facility] CONFIRMED BY [facility]    Culture STREPTOCOCCUS PNEUMONIAE AEROBIC BOTTLE ONLY   Final   Report Status PENDING  Incomplete   Organism ID, Bacteria STREPTOCOCCUS PNEUMONIAE  Final      Susceptibility   Streptococcus pneumoniae - MIC*    ERYTHROMYCIN <=0.12 SENSITIVE Sensitive     VANCOMYCIN 0.5 SENSITIVE Sensitive     TRIMETH/SULFA <=10 SENSITIVE Sensitive     CLINDAMYCIN <=0.25 SENSITIVE Sensitive     TETRACYCLINE Value in next row Sensitive      SENSITIVE0.5    * STREPTOCOCCUS PNEUMONIAE  MRSA PCR Screening     Status: None   Collection Time: 07/17/15  6:03 PM  Result Value Ref Range Status   MRSA by PCR NEGATIVE NEGATIVE Final    Comment:        The GeneXpert MRSA Assay (FDA approved for NASAL specimens only), is one component of a comprehensive MRSA colonization surveillance program. It is not intended to diagnose MRSA infection nor to guide  or monitor treatment for MRSA infections.     Coagulation Studies:  Recent Labs  07/17/15 1158  LABPROT 24.6*  INR 2.24    Urinalysis:  Recent Labs  07/17/15 1448  COLORURINE YELLOW*  LABSPEC 1.024  PHURINE 5.0  GLUCOSEU NEGATIVE  HGBUR NEGATIVE  BILIRUBINUR NEGATIVE  KETONESUR NEGATIVE  PROTEINUR >500*  NITRITE POSITIVE*  LEUKOCYTESUR NEGATIVE      Imaging: Dg Wrist 2 Views Left  07/18/2015  CLINICAL DATA:  Persistent wrist pain. Patient fell approximately 6 weeks ago and had subsequent surgery. EXAM: LEFT WRIST - 2 VIEW COMPARISON:  Radiographs 06/09/2015. FINDINGS: No postoperative radiographs available. Patient is status post plate and screw fixation of the distal radial and ulnar fractures. The hardware appears intact. There is persistent impaction of the radial fracture, but no significant displacement. There are irregular ossific densities surrounding the fractures which may reflect periosteal bone formation or bone grafting. There is osseous resorption around the fracture without gross bone destruction. The carpal bones appear intact. IMPRESSION: Interval ORIF of distal radial and ulnar fractures without progressive displacement. Bone reabsorption around the fractures is noted without gross bone destruction to suggest postoperative infection. Correlate clinically. Electronically Signed   By: Carey Bullocks M.D.   On: 07/18/2015 18:22   US Venous Img Lower Bilateral  07/18/2015  CLINICAL DATA:  Left greater than right bilateral lower extremity swelling for 1 month. EXAM: BILATERAL LOWER EXTREMITY VENOUS DOPPLER ULTRASOUND TECHNIQUE: Gray-scale sonography with graded compression, as well as color Doppler and duplex ultrasound were performed to evaluate the lower extremity deep venous systems from the level of the common femoral vein and including the common femoral, femoral, profunda femoral, popliteal and calf veins including the posterior tibial, peroneal and  gastrocnemius veins when visible. The superficial great saphenous vein was also interrogated. Spectral Doppler was utilized to evaluate flow at rest and with distal augmentation maneuvers in the common femoral, femoral and popliteal veins. COMPARISON:  04/07/2015 bilateral lower extremity venous Doppler scan. FINDINGS: RIGHT LOWER EXTREMITY Common Femoral Vein: No evidence of thrombus. Normal compressibility, respiratory phasicity and response to augmentation. Saphenofemoral Junction: No evidence of thrombus. Normal compressibility and flow on color Doppler imaging. Profunda Femoral Vein: No evidence of thrombus. Normal compressibility and flow on color Doppler imaging. Femoral Vein: No evidence of thrombus. Normal compressibility, respiratory phasicity and response to augmentation. Popliteal Vein: No evidence of thrombus. Normal compressibility, respiratory phasicity and response to augmentation. Calf Veins: No evidence of thrombus. Normal compressibility and flow on color Doppler imaging. Right peroneal veins could not be visualized, possibly due to technical limitations. Superficial Great Saphenous Vein: No evidence of thrombus. Normal compressibility and flow on color Doppler imaging.  Venous Reflux:  None. Other Findings:  None. LEFT LOWER EXTREMITY Common Femoral Vein: No evidence of thrombus. Normal compressibility, respiratory phasicity and response to augmentation. Saphenofemoral Junction: No evidence of thrombus. Normal compressibility and flow on color Doppler imaging. Profunda Femoral Vein: No evidence of thrombus. Normal compressibility and flow on color Doppler imaging. Femoral Vein: No evidence of thrombus. Normal compressibility, respiratory phasicity and response to augmentation. Popliteal Vein: No evidence of thrombus. Normal compressibility, respiratory phasicity and response to augmentation. Calf Veins: No evidence of thrombus. Normal compressibility and flow on color Doppler imaging. Superficial  Great Saphenous Vein: No evidence of thrombus. Normal compressibility and flow on color Doppler imaging. Venous Reflux:  None. Other Findings: There is a 5.7 x 0.9 x 2.7 cm cystic structure in the left popliteal fossa without internal vascularity on Doppler, which measured 3.2 x 1.4 x 2.4 cm on 04/11/2015 and likely represents a Baker's cyst. IMPRESSION: 1. No evidence of deep venous thrombosis in either lower extremity. 2. 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 Electronically Signed   By: Delbert Phenix M.D.   On: 07/18/2015 12:57     Medications:     . amiodarone  400 mg Oral BID  . antiseptic oral rinse  7 mL Mouth Rinse BID  . digoxin  0.125 mg Oral Daily  . donepezil  5 mg Oral QHS  . fluticasone  1 spray Each Nare Daily  . furosemide  40 mg Oral Daily  . insulin aspart  0-9 Units Subcutaneous TID WC  . insulin glargine  30 Units Subcutaneous q morning - 10a  . levofloxacin (LEVAQUIN) IV  750 mg Intravenous Q48H  . nystatin  1 g Topical BID  . phenazopyridine  100 mg Oral TID WC  . piperacillin-tazobactam (ZOSYN)  IV  3.375 g Intravenous 3 times per day  . rivaroxaban  20 mg Oral Daily  . simvastatin  10 mg Oral Daily  . sodium chloride  3 mL Intravenous Q12H  . vancomycin  1,250 mg Intravenous Q18H   acetaminophen **OR** acetaminophen, levalbuterol, ondansetron **OR** ondansetron (ZOFRAN) IV, oxymetazoline  Assessment/ Plan:  81 y.o.white female with atrial fibrillation, hypertension, COPD, osteoarthritis, hyperlipidemia, osteopenia, allergic rhinitis, lumbar spinal stenosis presents 07/17/2015   1. Chronic Kidney Disease stage III with proteinuria secondary to diabetic nephropathy.  - Baseline creatinine of 1.2 from 2/16.  - Kidney function relatively stable despite receiving contrast for CT scan PE protocol.  - Continue to monitor renal function - Will need outpatient follow up  2. Hypertension with atrial fibrillation. Now with rapid ventricular  response.  Blood pressure still high, 158/91 - Continue diltiazem and metoprolol for rate control. Amiodarone - appreciate cardiology input.  - previously on lisinopril, will restart to help with blood pressure control.  daily.  - Also takes furosemide at home.   3. Anemia of chronic kidney disease: Hemoglobin stable at 10.7. No indication for Epogen at present.   4. Secondary Hyperparathyroidism: Calcium at goal. No recent PTH or phosphorus available.    LOS: 3 Johsua Shevlin 12/5/20169:33 AM

## 2015-07-20 NOTE — Discharge Summary (Signed)
[facility] -  at [facility]   PATIENT NAME: Allison Shepard    MR#:  [id number]  DATE OF BIRTH:  12-14-1933  DATE OF ADMISSION:  07/17/2015 ADMITTING PHYSICIAN: Katharina Caperima Vaickute, MD  DATE OF DISCHARGE: 07/20/2015 PRIMARY CARE PHYSICIAN: Harrington ChallengerHIES, DAVID, MD    ADMISSION DIAGNOSIS:  Swelling [R60.9] Dyspnea [R06.00] Community acquired pneumonia [J18.9] Recurrent pneumonia [J18.9] Atrial fibrillation with rapid ventricular response (HCC) [I48.91] Altered mental status, unspecified altered mental status type [R41.82]  DISCHARGE DIAGNOSIS:  Active Problems:   Pneumonia   Acute respiratory failure with hypoxia (HCC)   Left upper lobe pneumonia   Hemoptysis   Atrial fibrillation with RVR (HCC)   Hypotension   Renal insufficiency   Obesity   Metabolic encephalopathy   SECONDARY DIAGNOSIS:   Past Medical History  Diagnosis Date  . COPD (chronic obstructive pulmonary disease) (HCC)   . Atrial fibrillation (HCC)   . Hypertension   . Hypercholesteremia   . Pneumonia   . Diabetes mellitus without complication (HCC)     type 2  . Headache     migraines - years ago  . Complication of anesthesia     difficulty to wake up after an 8 hour back surgery    HOSPITAL COURSE:   1.  Sepsis. Secondary to multilobar pneumonia blood cultures one bottle with gram-positive bacteria and other bottle is negative  Given levofloxacin, vancomycin and Zosyn for healthcare associated pneumonia patient has clinically improved . Changed iv to mouth antibiotics and d/c home Pulmonology is recommending outpatient repeat CAT scan chest in 2-3 months  CT chest is negative for pulmonary embolism  2. Acute respiratory failure with hypoxia. Continue oxygen therapy as needed to maintain pulse ox greater than 88%   appreciate pulmonologist recommendations Nebulizer treatments will be provided as needed basis   3. Left upper lobe pneumonia Continue patient on po  antibiotic therapy Follow-up on sputum culture and sensitivity   4. Hemoptysis, will probably from pneumonia  Resume Anticoagulants Xarelto  CT angiogram neg for pulmonary embolism  5. A. fib, RVR,  Rate controlled with amiodarone IV drip, weaned off to by mouth amiodarone 400 mg by mouth twice a day  Continue Cardizem and digoxin. Continue xarelto Appreciate cardiology recommendations   6. Hypotension, possibly secondary to sepsis and secondary to rate controlling drugs for A. fib, RVR  Improved  7.Acute On ch renal insufficiency. Secondary to IV contrast used for the CT angiogram of the chest Provid gentle hydration with IV fluids, creatinine is 1.19--> 1.04 --> 1.13-->. 1.18   8. Metabolic encephalopathy.  Improved, continue Supportive therapy  9. Recent history of left wrist fracture status post open reduction and internal fixation  Continue pain management  appreciate orthopedics input and recommend patient to follow up with orthopedics as an outpatient in 2 weeks as recommended by them  Deconditioning with physical therapy   DISCHARGE CONDITIONS:   fair  CONSULTS OBTAINED:  Treatment Team:  Stephanie AcreVishal Mungal, MD Mady HaagensenMunsoor Lateef, MD Ramonita LabAruna Shanekia Latella, MD Dalia HeadingKenneth A Fath, MD Christena FlakeJohn J Poggi, MD   PROCEDURES none  DRUG ALLERGIES:   Allergies  Allergen Reactions  . Hydromorphone Hcl Nausea And Vomiting  . Ibuprofen Other (See Comments)    Pt was told by her MD not to take this medication.    . Mucinex [Guaifenesin Er] Other (See Comments)    Red all over. Felt like i was on fire  . Percocet [Oxycodone-Acetaminophen] Hives    DISCHARGE MEDICATIONS:   Current Discharge  Medication List    START taking these medications   Details  amiodarone (PACERONE) 400 MG tablet Take 1 tablet (400 mg total) by mouth 2 (two) times daily. Qty: 100 tablet, Refills: 0    levofloxacin (LEVAQUIN) 500 MG tablet Take 1 tablet (500 mg total) by mouth daily. Qty: 5 tablet,  Refills: 0      CONTINUE these medications which have CHANGED   Details  lisinopril (PRINIVIL,ZESTRIL) 10 MG tablet Take 1 tablet (10 mg total) by mouth daily. Qty: 30 tablet, Refills: 0      CONTINUE these medications which have NOT CHANGED   Details  Cholecalciferol (VITAMIN D-3) 1000 UNITS CAPS Take 1,000 Units by mouth daily.    diltiazem (TIAZAC) 180 MG 24 hr capsule Take 1 capsule (180 mg total) by mouth daily. Qty: 30 capsule, Refills: 0    furosemide (LASIX) 40 MG tablet Take 40 mg by mouth daily.    insulin aspart (NOVOLOG) 100 UNIT/ML injection Inject 8-12 Units into the skin 3 (three) times daily with meals. Below 200=8 units and every 50 units above she gets one extra unit up to 12 units    insulin glargine (LANTUS) 100 UNIT/ML injection Inject 30 Units into the skin every morning.     metoprolol succinate (TOPROL-XL) 100 MG 24 hr tablet Take 100 mg by mouth daily. Take with or immediately following a meal.    rivaroxaban (XARELTO) 20 MG TABS tablet Take 20 mg by mouth daily.    simvastatin (ZOCOR) 10 MG tablet Take 10 mg by mouth daily.    albuterol (PROVENTIL HFA;VENTOLIN HFA) 108 (90 BASE) MCG/ACT inhaler Inhale 2 puffs into the lungs every 6 (six) hours as needed for wheezing or shortness of breath.    albuterol (PROVENTIL) (2.5 MG/3ML) 0.083% nebulizer solution Take 2.5 mg by nebulization every 6 (six) hours as needed for wheezing or shortness of breath.    digoxin (LANOXIN) 0.125 MG tablet Take 1 tablet (0.125 mg total) by mouth daily. Qty: 30 tablet, Refills: 0    donepezil (ARICEPT) 5 MG tablet Take 5 mg by mouth at bedtime.    fluticasone (FLONASE) 50 MCG/ACT nasal spray Place 1 spray into both nostrils daily. Qty: 16 g, Refills: 2    HYDROcodone-acetaminophen (NORCO) 10-325 MG tablet Take 1-2 tablets by mouth every 6 (six) hours as needed. Qty: 60 tablet, Refills: 0    meloxicam (MOBIC) 7.5 MG tablet Take 1 tablet (7.5 mg total) by mouth 2 (two) times  daily after a meal. Qty: 28 tablet, Refills: 2    nystatin (MYCOSTATIN) powder Apply 1 g topically 2 (two) times daily as needed.    oxymetazoline (AFRIN) 0.05 % nasal spray Place 1 spray into both nostrils 2 (two) times daily as needed.     zolpidem (AMBIEN) 5 MG tablet Take 5 mg by mouth at bedtime as needed for sleep.         DISCHARGE INSTRUCTIONS:   Activity as tolerated  Continue oxygen via nasal cannula Diet-healthy heart Follow-up with primary care physician in a week Follow-up with orthopedics regarding the  left wrist fracture as recorded by ortho Follow-up with cardiology Dr. Lady Gary  in 3 days  DIET:  Cardiac diet  DISCHARGE CONDITION:  Fair  ACTIVITY:  Activity as tolerated  OXYGEN:  Home Oxygen: Yes.     Oxygen Delivery: 3 liters/min via Patient connected to nasal cannula oxygen  DISCHARGE LOCATION:  home   If you experience worsening of your admission symptoms, develop shortness of  breath, life threatening emergency, suicidal or homicidal thoughts you must seek medical attention immediately by calling 911 or calling your MD immediately  if symptoms less severe.  You Must read complete instructions/literature along with all the possible adverse reactions/side effects for all the Medicines you take and that have been prescribed to you. Take any new Medicines after you have completely understood and accpet all the possible adverse reactions/side effects.   Please note  You were cared for by a hospitalist during your hospital stay. If you have any questions about your discharge medications or the care you received while you were in the hospital after you are discharged, you can call the unit and asked to speak with the hospitalist on call if the hospitalist that took care of you is not available. Once you are discharged, your primary care physician will handle any further medical issues. Please note that NO REFILLS for any discharge medications will be authorized  once you are discharged, as it is imperative that you return to your primary care physician (or establish a relationship with a primary care physician if you do not have one) for your aftercare needs so that they can reassess your need for medications and monitor your lab values.     Today  Chief Complaint  Patient presents with  . Altered Mental Status   Pt is feeling fine with no new complaints  ROS:  CONSTITUTIONAL: Denies fevers, chills. Denies any fatigue, weakness.  EYES: Denies blurry vision, double vision, eye pain. EARS, NOSE, THROAT: Denies tinnitus, ear pain, hearing loss. RESPIRATORY: Denies cough, wheeze, shortness of breath.  CARDIOVASCULAR: Denies chest pain, palpitations, edema.  GASTROINTESTINAL: Denies nausea, vomiting, diarrhea, abdominal pain. Denies bright red blood per rectum. GENITOURINARY: Denies dysuria, hematuria. ENDOCRINE: Denies nocturia or thyroid problems. HEMATOLOGIC AND LYMPHATIC: Denies easy bruising or bleeding. SKIN: Denies rash or lesion. MUSCULOSKELETAL: Denies pain in neck, back, shoulder, knees, hips or arthritic symptoms.  NEUROLOGIC: Denies paralysis, paresthesias.  PSYCHIATRIC: Denies anxiety or depressive symptoms.   VITAL SIGNS:  Blood pressure 142/85, pulse 113, temperature 98.9 F (37.2 C), temperature source Oral, resp. rate 22, height  (1.651 m), weight 109.3 kg (240 lb 15.4 oz), SpO2 93 %.  I/O:   Intake/Output Summary (Last 24 hours) at 07/20/15 1343 Last data filed at 07/20/15 1303  Gross per 24 hour  Intake    590 ml  Output   3000 ml  Net  -2410 ml    PHYSICAL EXAMINATION:  GENERAL:  81 y.o.-year-old patient lying in the bed with no acute distress.  EYES: Pupils equal, round, reactive to light and accommodation. No scleral icterus. Extraocular muscles intact.  HEENT: Head atraumatic, normocephalic. Oropharynx and nasopharynx clear.  NECK:  Supple, no jugular venous distention. No thyroid enlargement, no  tenderness.  LUNGS: Normal breath sounds bilaterally, no wheezing, rales,rhonchi or crepitation. No use of accessory muscles of respiration.  CARDIOVASCULAR: S1, S2 normal. No murmurs, rubs, or gallops.  ABDOMEN: Soft, non-tender, non-distended. Bowel sounds present. No organomegaly or mass.  EXTREMITIES: No pedal edema, cyanosis, or clubbing.  NEUROLOGIC: Cranial nerves II through XII are intact. Muscle strength 5/5 in all extremities. Sensation intact. Gait not checked.  PSYCHIATRIC: The patient is alert and oriented x 3.  SKIN: No obvious rash, lesion, or ulcer.   DATA REVIEW:   CBC  Recent Labs Lab 07/19/15 0513  WBC 7.5  HGB 10.7*  HCT 32.2*  PLT 173    Chemistries   Recent Labs Lab 07/19/15 0513  07/20/15 0413  NA 136 139  K 4.3 3.9  CL 107 107  CO2 26 27  GLUCOSE 156* 135*  BUN 21* 21*  CREATININE 1.18* 1.09*  CALCIUM 8.1* 8.8*  MG 1.8  --   AST 12*  --   ALT 12*  --   ALKPHOS 80  --   BILITOT 0.7  --     Cardiac Enzymes  Recent Labs Lab 07/17/15 1250  TROPONINI 0.03    Microbiology Results  Results for orders placed or performed during the hospital encounter of 07/17/15  Blood culture (routine x 2)     Status: None (Preliminary result)   Collection Time: 07/17/15 11:58 AM  Result Value Ref Range Status   Specimen Description BLOOD RIGHT ASSIST CONTROL  Final   Special Requests BAA,5ML,AER,ANA  Final   Culture NO GROWTH 3 DAYS  Final   Report Status PENDING  Incomplete  Blood culture (routine x 2)     Status: None (Preliminary result)   Collection Time: 07/17/15 11:58 AM  Result Value Ref Range Status   Specimen Description BLOOD LEFT ASSIST CONTROL  Final   Special Requests BAA,5ML,ANA,AER  Final   Culture  Setup Time   Final    GRAM POSITIVE COCCI AEROBIC BOTTLE ONLY CRITICAL RESULT CALLED TO, READ BACK BY AND VERIFIED WITH: MICHELLE WILLIAMS AT 0356 ON 07/18/15 [facility] CONFIRMED BY [facility]    Culture STREPTOCOCCUS PNEUMONIAE AEROBIC BOTTLE  ONLY   Final   Report Status PENDING  Incomplete   Organism ID, Bacteria STREPTOCOCCUS PNEUMONIAE  Final      Susceptibility   Streptococcus pneumoniae - MIC*    ERYTHROMYCIN <=0.12 SENSITIVE Sensitive     VANCOMYCIN 0.5 SENSITIVE Sensitive     TRIMETH/SULFA <=10 SENSITIVE Sensitive     CLINDAMYCIN <=0.25 SENSITIVE Sensitive     TETRACYCLINE Value in next row Sensitive      SENSITIVE0.5    * STREPTOCOCCUS PNEUMONIAE  MRSA PCR Screening     Status: None   Collection Time: 07/17/15  6:03 PM  Result Value Ref Range Status   MRSA by PCR NEGATIVE NEGATIVE Final    Comment:        The GeneXpert MRSA Assay (FDA approved for NASAL specimens only), is one component of a comprehensive MRSA colonization surveillance program. It is not intended to diagnose MRSA infection nor to guide or monitor treatment for MRSA infections.     RADIOLOGY:  Dg Chest 1 View  07/17/2015  CLINICAL DATA:  Altered mental status. EXAM: CHEST 1 VIEW COMPARISON:  April 11, 2015. FINDINGS: Stable cardiomegaly. Right lung is clear. No pneumothorax or significant pleural effusion is noted. New airspace opacity is noted in left upper lobe most consistent with pneumonia. Bony thorax is unremarkable. IMPRESSION: Probable left upper lobe pneumonia. Continued radiographic followup is recommended to ensure resolution. Electronically Signed   By: Lupita Raider, M.D.   On: 07/17/2015 13:25   Dg Wrist 2 Views Left  07/18/2015  CLINICAL DATA:  Persistent wrist pain. Patient fell approximately 6 weeks ago and had subsequent surgery. EXAM: LEFT WRIST - 2 VIEW COMPARISON:  Radiographs 06/09/2015. FINDINGS: No postoperative radiographs available. Patient is status post plate and screw fixation of the distal radial and ulnar fractures. The hardware appears intact. There is persistent impaction of the radial fracture, but no significant displacement. There are irregular ossific densities surrounding the fractures which may reflect  periosteal bone formation or bone grafting. There is osseous resorption around the fracture without gross  bone destruction. The carpal bones appear intact. IMPRESSION: Interval ORIF of distal radial and ulnar fractures without progressive displacement. Bone reabsorption around the fractures is noted without gross bone destruction to suggest postoperative infection. Correlate clinically. Electronically Signed   By: Carey Bullocks M.D.   On: 07/18/2015 18:22   Ct Angio Chest Pe W/cm &/or Wo Cm  07/17/2015  CLINICAL DATA:  Dyspnea.  Atrial fibrillation. EXAM: CT ANGIOGRAPHY CHEST WITH CONTRAST TECHNIQUE: Multidetector CT imaging of the chest was performed using the standard protocol during bolus administration of intravenous contrast. Multiplanar CT image reconstructions and MIPs were obtained to evaluate the vascular anatomy. CONTRAST:  80mL OMNIPAQUE IOHEXOL 350 MG/ML SOLN COMPARISON:  04/06/2015 chest CT. Chest radiograph from earlier today. FINDINGS: Mediastinum/Nodes: The study is moderate quality for the evaluation of pulmonary embolism, with evaluation of the subsegmental pulmonary arteries limited by motion artifact. There are no filling defects in the central, lobar or segmental pulmonary artery branches to suggest acute pulmonary embolism. Great vessels are normal in course and caliber. Mild cardiomegaly . No pericardial fluid/thickening. Left anterior descending, left circumflex and right coronary atherosclerosis. Normal visualized thyroid. There is fluid and debris in the mid thoracic esophagus. No axillary adenopathy. Mildly enlarged 1.0 cm right paratracheal node (series 4/ image 47). Mildly enlarged 1.3 cm subcarinal node (4/62). Mildly enlarged 1.2 cm left infrahilar node (4/66) . Mildly enlarged 1.2 cm right infrahilar node (4/72). Lungs/Pleura: No pneumothorax. No pleural effusion. There is severe patchy consolidation and ground-glass opacity involving most of the left upper lobe including the  lingula, with internal air bronchograms and no appreciable central airway stenosis. There is a subsolid 2.1 x 1.2 cm nodule in the posterior right lower lobe (series 6/ image 75), not definitely seen on the prior chest CT. Dependent density throughout both lungs likely represents passive atelectasis. Upper abdomen: Stable simple 6.5 cm renal cyst in the upper left kidney. Musculoskeletal: No aggressive appearing focal osseous lesions. Moderate degenerative changes in the thoracic spine. Partially visualized is posterior spinal fusion hardware in the upper lumbar spine. Review of the MIP images confirms the above findings. IMPRESSION: 1. No evidence of pulmonary embolism, with mild limitations as described. 2. Severe patchy consolidation and ground-glass opacity involving most of the left upper lobe, with internal air bronchograms. New subsolid nodule in the posterior right lower lobe. No appreciable central airway stenosis. These findings are favored to represent a multilobar infectious pneumonia, however an underlying neoplasm is not entirely excluded and a follow-up post treatment chest CT is advised in 6-8 weeks. 3. Mild mediastinal and bilateral hilar lymphadenopathy, nonspecific. 4. Three-vessel coronary atherosclerosis. Electronically Signed   By: Delbert Phenix M.D.   On: 07/17/2015 16:08   US Venous Img Lower Bilateral  07/18/2015  CLINICAL DATA:  Left greater than right bilateral lower extremity swelling for 1 month. EXAM: BILATERAL LOWER EXTREMITY VENOUS DOPPLER ULTRASOUND TECHNIQUE: Gray-scale sonography with graded compression, as well as color Doppler and duplex ultrasound were performed to evaluate the lower extremity deep venous systems from the level of the common femoral vein and including the common femoral, femoral, profunda femoral, popliteal and calf veins including the posterior tibial, peroneal and gastrocnemius veins when visible. The superficial great saphenous vein was also interrogated.  Spectral Doppler was utilized to evaluate flow at rest and with distal augmentation maneuvers in the common femoral, femoral and popliteal veins. COMPARISON:  04/07/2015 bilateral lower extremity venous Doppler scan. FINDINGS: RIGHT LOWER EXTREMITY Common Femoral Vein: No evidence of thrombus. Normal compressibility,  respiratory phasicity and response to augmentation. Saphenofemoral Junction: No evidence of thrombus. Normal compressibility and flow on color Doppler imaging. Profunda Femoral Vein: No evidence of thrombus. Normal compressibility and flow on color Doppler imaging. Femoral Vein: No evidence of thrombus. Normal compressibility, respiratory phasicity and response to augmentation. Popliteal Vein: No evidence of thrombus. Normal compressibility, respiratory phasicity and response to augmentation. Calf Veins: No evidence of thrombus. Normal compressibility and flow on color Doppler imaging. Right peroneal veins could not be visualized, possibly due to technical limitations. Superficial Great Saphenous Vein: No evidence of thrombus. Normal compressibility and flow on color Doppler imaging. Venous Reflux:  None. Other Findings:  None. LEFT LOWER EXTREMITY Common Femoral Vein: No evidence of thrombus. Normal compressibility, respiratory phasicity and response to augmentation. Saphenofemoral Junction: No evidence of thrombus. Normal compressibility and flow on color Doppler imaging. Profunda Femoral Vein: No evidence of thrombus. Normal compressibility and flow on color Doppler imaging. Femoral Vein: No evidence of thrombus. Normal compressibility, respiratory phasicity and response to augmentation. Popliteal Vein: No evidence of thrombus. Normal compressibility, respiratory phasicity and response to augmentation. Calf Veins: No evidence of thrombus. Normal compressibility and flow on color Doppler imaging. Superficial Great Saphenous Vein: No evidence of thrombus. Normal compressibility and flow on color  Doppler imaging. Venous Reflux:  None. Other Findings: There is a 5.7 x 0.9 x 2.7 cm cystic structure in the left popliteal fossa without internal vascularity on Doppler, which measured 3.2 x 1.4 x 2.4 cm on 04/11/2015 and likely represents a Baker's cyst. IMPRESSION: 1. No evidence of deep venous thrombosis in either lower extremity. 2. 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 Electronically Signed   By: Delbert Phenix M.D.   On: 07/18/2015 12:57    EKG:   Orders placed or performed during the hospital encounter of 07/17/15  . ED EKG  . ED EKG      Management plans discussed with the patient, family and they are in agreement.  CODE STATUS:     Code Status Orders        Start     Ordered   07/17/15 1729  Full code   Continuous     07/17/15 1728      TOTAL TIME TAKING CARE OF THIS PATIENT: 45  minutes.    @  on 07/20/2015 at 1:43 PM  Between 7am to 6pm - Pager - [phone]  After 6pm go to [email or web address] - password [email or web address] [facility]  [place] [facility]  Office  [phone]  CC: Primary care physician; Mickey Farber, MD

## 2015-07-20 NOTE — Plan of Care (Signed)
Problem: Acute Rehab PT Goals(only PT should resolve) Goal: Pt Will Ambulate With HR not over 110 Goal: Pt Will Go Up/Down Stairs And HR not over 110

## 2015-07-21 ENCOUNTER — Telehealth: Payer: Self-pay | Admitting: *Deleted

## 2015-07-21 LAB — BLOOD GAS, VENOUS
Acid-base deficit: 0.7 mmol/L (ref 0.0–2.0)
BICARBONATE: 25.8 meq/L (ref 21.0–28.0)
PH VEN: 7.33 (ref 7.320–7.430)
Patient temperature: 37
pCO2, Ven: 49 mmHg (ref 44.0–60.0)

## 2015-07-21 NOTE — Telephone Encounter (Signed)
-----   Message from Stephanie AcreVishal Mungal, MD sent at 07/18/2015 12:46 PM EST ----- Regarding: HFU Please schedule HFU with next available provider in 2 weeks  Thanks VM

## 2015-07-21 NOTE — Telephone Encounter (Signed)
LMOM for pt to call back to schedule hosp f/u for 2 weeks.

## 2015-07-22 ENCOUNTER — Encounter: Payer: Self-pay | Admitting: *Deleted

## 2015-07-22 LAB — CULTURE, BLOOD (ROUTINE X 2): CULTURE: NO GROWTH

## 2015-07-22 NOTE — Telephone Encounter (Signed)
Left message for patient to call back  

## 2015-07-23 NOTE — Telephone Encounter (Signed)
appt scheduled.  Nothing further needed.  

## 2015-07-24 ENCOUNTER — Ambulatory Visit (INDEPENDENT_AMBULATORY_CARE_PROVIDER_SITE_OTHER): Payer: Medicare Other | Admitting: Pulmonary Disease

## 2015-07-24 ENCOUNTER — Encounter: Payer: Self-pay | Admitting: Pulmonary Disease

## 2015-07-24 VITALS — BP 130/74 | HR 62 | Ht 65.5 in | Wt 242.8 lb

## 2015-07-24 DIAGNOSIS — J45909 Unspecified asthma, uncomplicated: Secondary | ICD-10-CM

## 2015-07-24 DIAGNOSIS — J449 Chronic obstructive pulmonary disease, unspecified: Secondary | ICD-10-CM | POA: Diagnosis not present

## 2015-07-24 DIAGNOSIS — R911 Solitary pulmonary nodule: Secondary | ICD-10-CM | POA: Diagnosis not present

## 2015-07-24 DIAGNOSIS — J4489 Other specified chronic obstructive pulmonary disease: Secondary | ICD-10-CM

## 2015-07-24 DIAGNOSIS — J13 Pneumonia due to Streptococcus pneumoniae: Secondary | ICD-10-CM

## 2015-07-24 MED ORDER — FLUTICASONE FUROATE-VILANTEROL 100-25 MCG/INH IN AEPB: 1.0000 | INHALATION_SPRAY | Freq: Every day | RESPIRATORY_TRACT | Status: AC

## 2015-07-24 MED ORDER — FLUTICASONE FUROATE-VILANTEROL 100-25 MCG/INH IN AEPB: 1.0000 | INHALATION_SPRAY | Freq: Every day | RESPIRATORY_TRACT | Status: DC

## 2015-07-26 NOTE — Progress Notes (Signed)
PULMONARY OFFICE POST-HOSPITALIZATION FOLLOW UP  PROBLEMS: Smoking history Chronic dyspnea, presumed COPD Recent pneumococcal PNA  Pt PROFILE: [age] F admitted 12/02-12/05/16 when she presented with febrile illness and severe AMS. Heer CXR and CT chest revealed dense L lung infiltrate. Her BC's were + for pneumococcus. She was seen in consultation by Dr Dema SeverinMungal and this F/U was arranged.   SUBJ: She is markedly improved and estimates that she is approx 80% back to her baseline. She has no new complaints. She is completing a course of levofloxacin  OBJ: Filed Vitals:   07/24/15 1017  BP: 130/74  Pulse: 62  Height: 5' 5.5" (1.664 m)  Weight: 242 lb 12.8 oz (110.133 kg)  SpO2: 97%   Chronically ill appearing, no overt distress, centripetal obesity HEENT: All WNL Neck: NO LAN, no JVD noted Lungs: full BS, normal percussion note throughout, scattered wheezes Cardiovascular: IRIR, rate controlled, no M noted Abdomen: Soft, NT +BS Ext: 2+ symmetric pretibial edema Neuro: CNs intact, motor/sens grossly intact Skin: severe xerodermia with multiple ecchymoses on all extremities   DATA: Recent CXR and CT chest reviewed. The CT chest incidentally revealed a pleural based RLL nodular density that had not been seen on previosu CT chest in Aug 2016  IMPRESSION:   ICD-9-CM ICD-10-CM   1. Pneumonia of left upper lobe due to Streptococcus pneumoniae (HCC) 481 J13 DG Chest 2 View  2. Lung nodule 793.11 R91.1   3. Asthmatic bronchitis , chronic (HCC) 493.20 J44.9     J45.909      PLAN: Begin Breo 100/25, daily Complete abx as prescribed ROV in 6 wks with CXR    Merwyn Katosavid B Jarrius [place], MD [facility]

## 2015-07-27 NOTE — Pre-Procedure Instructions (Signed)
Cleared by Dr Harrington Challengerhies and left message at patient number to bring Lantus in am to hospital instead of taking am dose

## 2015-07-27 NOTE — Pre-Procedure Instructions (Signed)
No clearance note from Dr Audelia Actonheis yet. Patient appt 07/27/15

## 2015-07-28 ENCOUNTER — Ambulatory Visit: Payer: Medicare Other | Admitting: Anesthesiology

## 2015-07-28 ENCOUNTER — Ambulatory Visit
Admission: RE | Admit: 2015-07-28 | Discharge: 2015-07-28 | Disposition: A | Discharge status: Home / Self Care | Payer: Medicare Other | Source: Ambulatory Visit | Attending: Ophthalmology | Admitting: Ophthalmology

## 2015-07-28 ENCOUNTER — Encounter
Admission: RE | Disposition: A | Discharge status: Home / Self Care | Payer: Self-pay | Source: Ambulatory Visit | Attending: Ophthalmology

## 2015-07-28 DIAGNOSIS — I4891 Unspecified atrial fibrillation: Secondary | ICD-10-CM | POA: Insufficient documentation

## 2015-07-28 DIAGNOSIS — Z7982 Long term (current) use of aspirin: Secondary | ICD-10-CM | POA: Insufficient documentation

## 2015-07-28 DIAGNOSIS — Z8249 Family history of ischemic heart disease and other diseases of the circulatory system: Secondary | ICD-10-CM | POA: Diagnosis not present

## 2015-07-28 DIAGNOSIS — J45909 Unspecified asthma, uncomplicated: Secondary | ICD-10-CM | POA: Insufficient documentation

## 2015-07-28 DIAGNOSIS — Z7901 Long term (current) use of anticoagulants: Secondary | ICD-10-CM | POA: Insufficient documentation

## 2015-07-28 DIAGNOSIS — Z79899 Other long term (current) drug therapy: Secondary | ICD-10-CM | POA: Diagnosis not present

## 2015-07-28 DIAGNOSIS — Z90710 Acquired absence of both cervix and uterus: Secondary | ICD-10-CM | POA: Diagnosis not present

## 2015-07-28 DIAGNOSIS — J449 Chronic obstructive pulmonary disease, unspecified: Secondary | ICD-10-CM | POA: Insufficient documentation

## 2015-07-28 DIAGNOSIS — I10 Essential (primary) hypertension: Secondary | ICD-10-CM | POA: Diagnosis not present

## 2015-07-28 DIAGNOSIS — E7800 Pure hypercholesterolemia, unspecified: Secondary | ICD-10-CM | POA: Insufficient documentation

## 2015-07-28 DIAGNOSIS — H2512 Age-related nuclear cataract, left eye: Secondary | ICD-10-CM | POA: Insufficient documentation

## 2015-07-28 DIAGNOSIS — Z794 Long term (current) use of insulin: Secondary | ICD-10-CM | POA: Diagnosis not present

## 2015-07-28 DIAGNOSIS — E119 Type 2 diabetes mellitus without complications: Secondary | ICD-10-CM | POA: Insufficient documentation

## 2015-07-28 HISTORY — DX: Unspecified asthma, uncomplicated: J45.909

## 2015-07-28 HISTORY — DX: Chronic kidney disease, unspecified: N18.9

## 2015-07-28 HISTORY — DX: Edema, unspecified: R60.9

## 2015-07-28 HISTORY — DX: Cardiac arrhythmia, unspecified: I49.9

## 2015-07-28 HISTORY — DX: Dorsopathy, unspecified: M53.9

## 2015-07-28 HISTORY — DX: Reserved for inherently not codable concepts without codable children: IMO0001

## 2015-07-28 HISTORY — PX: CATARACT EXTRACTION W/PHACO: SHX586

## 2015-07-28 HISTORY — DX: Wheezing: R06.2

## 2015-07-28 HISTORY — DX: Unspecified osteoarthritis, unspecified site: M19.90

## 2015-07-28 LAB — GLUCOSE, CAPILLARY: GLUCOSE-CAPILLARY: 105 mg/dL — AB (ref 65–99)

## 2015-07-28 SURGERY — PHACOEMULSIFICATION, CATARACT, WITH IOL INSERTION
Anesthesia: Monitor Anesthesia Care | Site: Eye | Laterality: Left | Wound class: Clean

## 2015-07-28 MED ORDER — ONDANSETRON HCL 4 MG/2ML IJ SOLN: 4.0000 mg | Freq: Once | INTRAMUSCULAR | Status: DC | PRN

## 2015-07-28 MED ORDER — LIDOCAINE HCL (PF) 1 % IJ SOLN
INTRAMUSCULAR | Status: AC
  Filled 2015-07-28: qty 2

## 2015-07-28 MED ORDER — NA CHONDROIT SULF-NA HYALURON 40-17 MG/ML IO SOLN
INTRAOCULAR | Status: AC
  Filled 2015-07-28: qty 1

## 2015-07-28 MED ORDER — ARMC OPHTHALMIC DILATING GEL
1.0000 "application " | OPHTHALMIC | Status: AC | PRN
  Administered 2015-07-28 (×2): 1 via OPHTHALMIC

## 2015-07-28 MED ORDER — FENTANYL CITRATE (PF) 100 MCG/2ML IJ SOLN
INTRAMUSCULAR | Status: DC | PRN
  Administered 2015-07-28: 25 ug via INTRAVENOUS
  Administered 2015-07-28: 50 ug via INTRAVENOUS
  Administered 2015-07-28: 25 ug via INTRAVENOUS

## 2015-07-28 MED ORDER — TETRACAINE HCL 0.5 % OP SOLN
OPHTHALMIC | Status: AC
  Administered 2015-07-28: 1 [drp] via OPHTHALMIC
  Filled 2015-07-28: qty 2

## 2015-07-28 MED ORDER — TETRACAINE HCL 0.5 % OP SOLN
1.0000 [drp] | OPHTHALMIC | Status: AC | PRN
  Administered 2015-07-28: 1 [drp] via OPHTHALMIC

## 2015-07-28 MED ORDER — SODIUM CHLORIDE 0.9 % IV SOLN
INTRAVENOUS | Status: DC
  Administered 2015-07-28 (×2): via INTRAVENOUS

## 2015-07-28 MED ORDER — EPINEPHRINE HCL 1 MG/ML IJ SOLN
INTRAMUSCULAR | Status: AC
  Filled 2015-07-28: qty 1

## 2015-07-28 MED ORDER — MOXIFLOXACIN HCL 0.5 % OP SOLN
OPHTHALMIC | Status: DC | PRN
  Administered 2015-07-28: 1 [drp] via OPHTHALMIC

## 2015-07-28 MED ORDER — POVIDONE-IODINE 5 % OP SOLN
1.0000 "application " | OPHTHALMIC | Status: AC | PRN
  Administered 2015-07-28: 1 via OPHTHALMIC

## 2015-07-28 MED ORDER — ARMC OPHTHALMIC DILATING GEL
OPHTHALMIC | Status: AC
  Administered 2015-07-28: 1 via OPHTHALMIC
  Filled 2015-07-28: qty 0.25

## 2015-07-28 MED ORDER — CEFUROXIME OPHTHALMIC INJECTION 1 MG/0.1 ML
INJECTION | OPHTHALMIC | Status: AC
  Filled 2015-07-28: qty 0.1

## 2015-07-28 MED ORDER — EPINEPHRINE HCL 1 MG/ML IJ SOLN
INTRAOCULAR | Status: DC | PRN
  Administered 2015-07-28: 1 mL via OPHTHALMIC

## 2015-07-28 MED ORDER — ONDANSETRON HCL 4 MG/2ML IJ SOLN
INTRAMUSCULAR | Status: DC | PRN
  Administered 2015-07-28: 4 mg via INTRAVENOUS

## 2015-07-28 MED ORDER — FENTANYL CITRATE (PF) 100 MCG/2ML IJ SOLN: 25.0000 ug | INTRAMUSCULAR | Status: DC | PRN

## 2015-07-28 MED ORDER — MOXIFLOXACIN HCL 0.5 % OP SOLN: 1.0000 [drp] | OPHTHALMIC | Status: DC | PRN

## 2015-07-28 MED ORDER — CEFUROXIME OPHTHALMIC INJECTION 1 MG/0.1 ML
INJECTION | OPHTHALMIC | Status: DC | PRN
  Administered 2015-07-28: .1 mL via INTRACAMERAL

## 2015-07-28 MED ORDER — NA CHONDROIT SULF-NA HYALURON 40-17 MG/ML IO SOLN
INTRAOCULAR | Status: DC | PRN
  Administered 2015-07-28: 1 mL via INTRAOCULAR

## 2015-07-28 MED ORDER — POVIDONE-IODINE 5 % OP SOLN
OPHTHALMIC | Status: AC
  Administered 2015-07-28: 1 via OPHTHALMIC
  Filled 2015-07-28: qty 30

## 2015-07-28 MED ORDER — MOXIFLOXACIN HCL 0.5 % OP SOLN
OPHTHALMIC | Status: AC
  Filled 2015-07-28: qty 3

## 2015-07-28 MED ORDER — MIDAZOLAM HCL 2 MG/2ML IJ SOLN
INTRAMUSCULAR | Status: DC | PRN
  Administered 2015-07-28 (×3): 1 mg via INTRAVENOUS

## 2015-07-28 MED ORDER — CARBACHOL 0.01 % IO SOLN
INTRAOCULAR | Status: DC | PRN
  Administered 2015-07-28: .5 mL via INTRAOCULAR

## 2015-07-28 SURGICAL SUPPLY — 22 items
CANNULA ANT/CHMB 27GA (MISCELLANEOUS) ×3 IMPLANT
CUP MEDICINE 2OZ PLAST GRAD ST (MISCELLANEOUS) ×3 IMPLANT
GLOVE BIO SURGEON STRL SZ8 (GLOVE) ×3 IMPLANT
GLOVE BIOGEL M 6.5 STRL (GLOVE) ×3 IMPLANT
GLOVE SURG LX 8.0 MICRO (GLOVE) ×2
GLOVE SURG LX STRL 8.0 MICRO (GLOVE) ×1 IMPLANT
GOWN STRL REUS W/ TWL LRG LVL3 (GOWN DISPOSABLE) ×2 IMPLANT
GOWN STRL REUS W/TWL LRG LVL3 (GOWN DISPOSABLE) ×4
LENS IOL TECNIS 23.0 (Intraocular Lens) ×3 IMPLANT
LENS IOL TECNIS MONO 1P 23.0 (Intraocular Lens) ×1 IMPLANT
PACK CATARACT (MISCELLANEOUS) ×3 IMPLANT
PACK CATARACT BRASINGTON LX (MISCELLANEOUS) ×3 IMPLANT
PACK EYE AFTER SURG (MISCELLANEOUS) ×3 IMPLANT
SOL BSS BAG (MISCELLANEOUS) ×3
SOL PREP PVP 2OZ (MISCELLANEOUS) ×3
SOLUTION BSS BAG (MISCELLANEOUS) ×1 IMPLANT
SOLUTION PREP PVP 2OZ (MISCELLANEOUS) ×1 IMPLANT
SYR 3ML LL SCALE MARK (SYRINGE) ×3 IMPLANT
SYR 5ML LL (SYRINGE) ×3 IMPLANT
SYR TB 1ML 27GX1/2 LL (SYRINGE) ×3 IMPLANT
WATER STERILE IRR 1000ML POUR (IV SOLUTION) ×3 IMPLANT
WIPE NON LINTING 3.25X3.25 (MISCELLANEOUS) ×3 IMPLANT

## 2015-07-28 NOTE — OR Nursing (Signed)
Dr Druscilla BrowniePorfilio visited to offer pain medication and assess the patients pain level.  She states it is getting somewhat better and she doesn't like to take pain medication. Will take it when she gets home.

## 2015-07-28 NOTE — Anesthesia Preprocedure Evaluation (Signed)
Anesthesia Evaluation  Patient identified by MRN, date of birth, ID band Patient awake    Reviewed: Allergy & Precautions, NPO status , Patient's Chart, lab work & pertinent test results, reviewed documented beta blocker date and time   Airway Mallampati: III       Dental no notable dental hx.    Pulmonary shortness of breath, COPD, former smoker,     + decreased breath sounds      Cardiovascular Exercise Tolerance: Poor hypertension, Pt. on medications and Pt. on home beta blockers + CAD  + dysrhythmias  Rhythm:Irregular     Neuro/Psych  Headaches,    GI/Hepatic negative GI ROS, Neg liver ROS,   Endo/Other  diabetes, Type 1, Insulin DependentMorbid obesity  Renal/GU      Musculoskeletal   Abdominal (+) + obese,   Peds  Hematology   Anesthesia Other Findings   Reproductive/Obstetrics                             Anesthesia Physical Anesthesia Plan  ASA: III  Anesthesia Plan: MAC   Post-op Pain Management:    Induction: Intravenous  Airway Management Planned: Nasal Cannula  Additional Equipment:   Intra-op Plan:   Post-operative Plan:   Informed Consent: I have reviewed the patients History and Physical, chart, labs and discussed the procedure including the risks, benefits and alternatives for the proposed anesthesia with the patient or authorized representative who has indicated his/her understanding and acceptance.     Plan Discussed with: CRNA  Anesthesia Plan Comments:         Anesthesia Quick Evaluation

## 2015-07-28 NOTE — Discharge Instructions (Signed)
Eye Surgery Discharge Instructions  Expect mild scratchy sensation or mild soreness. DO NOT RUB YOUR EYE!  The day of surgery:  Minimal physical activity, but bed rest is not required  No reading, computer work, or close hand work  No bending, lifting, or straining.  May watch TV  For 24 hours:  No driving, legal decisions, or alcoholic beverages  Safety precautions  Eat anything you prefer: It is better to start with liquids, then soup then solid foods.  _____ Eye patch should be worn until postoperative exam tomorrow.  ____ Solar shield eyeglasses should be worn for comfort in the sunlight/patch while sleeping  Resume all regular medications including aspirin or Coumadin if these were discontinued prior to surgery. You may shower, bathe, shave, or wash your hair. Tylenol may be taken for mild discomfort.  Call your doctor if you experience significant pain, nausea, or vomiting, fever > 101 or other signs of infection. [phone] or [phone] Specific instructions:  Follow-up Information    Follow up with Carlena BjornstadPORFILIO,WILLIAM LOUIS, MD On 07/29/2015.   Specialty:  Ophthalmology   Why:  10:50   Contact information:   [address] [place] [id number] [phone]

## 2015-07-28 NOTE — H&P (Signed)
  All labs reviewed. Abnormal studies sent to patients PCP when indicated.  Previous H&P reviewed, patient examined, there are NO CHANGES.  Allison Shepard LOUIS12/13/20167:16 AM

## 2015-07-28 NOTE — OR Nursing (Signed)
Dr Mordecai RasmussenVanstavern aware patient took her Beta Blocker yesterday. No orders for one at this time.  DR Porfilio aware of motrin allergy in hx no change in orders at this time.

## 2015-07-28 NOTE — Op Note (Signed)
PREOPERATIVE DIAGNOSIS:  Nuclear sclerotic cataract of the left eye.   POSTOPERATIVE DIAGNOSIS:  nuclear sclerotic cataract left eye   OPERATIVE PROCEDURE:  Procedure(s): CATARACT EXTRACTION PHACO AND INTRAOCULAR LENS PLACEMENT (IOC)   SURGEON:  Galen ManilaWilliam Korbin Mapps, MD.   ANESTHESIA:   Anesthesiologist: Gijsbertus Georgana CurioF Van Staveren, MD CRNA: Junious SilkMark Noles, CRNA  1.      Managed anesthesia care. 2.      Topical tetracaine drops followed by 2% Xylocaine jelly applied in the preoperative holding area.   COMPLICATIONS:  None.   TECHNIQUE:   Stop and chop   DESCRIPTION OF PROCEDURE:  The patient was examined and consented in the preoperative holding area where the aforementioned topical anesthesia was applied to the left eye and then brought back to the Operating Room where the left eye was prepped and draped in the usual sterile ophthalmic fashion and a lid speculum was placed. A paracentesis was created with the side port blade and the anterior chamber was filled with viscoelastic. A near clear corneal incision was performed with the steel keratome. A continuous curvilinear capsulorrhexis was performed with a cystotome followed by the capsulorrhexis forceps. Hydrodissection and hydrodelineation were carried out with BSS on a blunt cannula. The lens was removed in a stop and chop  technique and the remaining cortical material was removed with the irrigation-aspiration handpiece. The capsular bag was inflated with viscoelastic and the Technis ZCB00 lens was placed in the capsular bag without complication. The remaining viscoelastic was removed from the eye with the irrigation-aspiration handpiece. The wounds were hydrated. The anterior chamber was flushed with Miostat and the eye was inflated to physiologic pressure. 0.1 mL of cefuroxime concentration 10 mg/mL was placed in the anterior chamber. The wounds were found to be water tight. The eye was dressed with Vigamox. The patient was given protective glasses  to wear throughout the day and a shield with which to sleep tonight. The patient was also given drops with which to begin a drop regimen today and will follow-up with me in one day.  Implant Name Type Inv. Item Serial No. Manufacturer Lot No. LRB No. Used  LENS IMPL INTRAOC ZCB00 23.0 - [id number] Intraocular Lens LENS IMPL INTRAOC ZCB00 23.0 [id number] AMO [id number] Left 1   Procedure(s) with comments: CATARACT EXTRACTION PHACO AND INTRAOCULAR LENS PLACEMENT (IOC) (Left) - US 2:16 AP% 27.4 CDE 37.45 fluid pack lot # [id number]  Electronically signed: Lyndell Gillyard LOUIS 07/28/2015 7:55 AM

## 2015-07-28 NOTE — Anesthesia Postprocedure Evaluation (Signed)
Anesthesia Post Note  Patient: Allison Shepard  Procedure(s) Performed: Procedure(s) (LRB): CATARACT EXTRACTION PHACO AND INTRAOCULAR LENS PLACEMENT (IOC) (Left)  Patient location during evaluation: PACU Anesthesia Type: MAC Level of consciousness: awake Pain management: pain level controlled Vital Signs Assessment: post-procedure vital signs reviewed and stable Respiratory status: nonlabored ventilation Cardiovascular status: stable Anesthetic complications: no    Last Vitals:  Filed Vitals:   07/28/15 0613  BP: 155/78  Pulse: 76  Resp: 22    Last Pain:  Filed Vitals:   07/28/15 0616  PainSc: 5                  VAN STAVEREN,Zeena Starkel

## 2015-07-28 NOTE — OR Nursing (Signed)
Before dc home.  She is instructed that her pain preception would be decreased by taking her pain medication on a regular basis. She will heal better with less stress on her body due to pain.

## 2015-07-28 NOTE — Anesthesia Procedure Notes (Signed)
Procedure Name: MAC Date/Time: 07/28/2015 7:42 AM Performed by: Junious SilkNOLES, Hendrixx Severin Pre-anesthesia Checklist: Patient identified, Emergency Drugs available, Suction available, Patient being monitored and Timeout performed Oxygen Delivery Method: Nasal cannula

## 2015-07-28 NOTE — Transfer of Care (Signed)
Immediate Anesthesia Transfer of Care Note  Patient: Allison Shepard  Procedure(s) Performed: Procedure(s) with comments: CATARACT EXTRACTION PHACO AND INTRAOCULAR LENS PLACEMENT (IOC) (Left) - US 2:16 AP% 27.4 CDE 37.45 fluid pack lot # [id number]  Patient Location: PACU  Anesthesia Type:MAC  Level of Consciousness: awake, alert  and oriented  Airway & Oxygen Therapy: Patient connected to nasal cannula oxygen  Post-op Assessment: Report given to RN and Post -op Vital signs reviewed and stable  Post vital signs: Reviewed and stable  Last Vitals:  Filed Vitals:   07/28/15 0613  BP: 155/78  Pulse: 76  Resp: 22    Complications: No apparent anesthesia complications

## 2015-08-04 ENCOUNTER — Encounter: Payer: Self-pay | Admitting: Occupational Therapy

## 2015-08-04 ENCOUNTER — Ambulatory Visit: Payer: Medicare Other | Attending: Orthopaedic Surgery | Admitting: Occupational Therapy

## 2015-08-04 DIAGNOSIS — M6281 Muscle weakness (generalized): Secondary | ICD-10-CM

## 2015-08-04 NOTE — Patient Instructions (Signed)
Instructed in importance of movement.

## 2015-08-04 NOTE — Therapy (Signed)
[place] MAIN [facility] SERVICES [address] [place], [place], [address] Phone: [phone]   Fax:  [phone]  Occupational Therapy evaluation /Treatment  Patient Details  Name: Allison Shepard MRN: [id number] Date of Birth: 07/12/1934 No Data Recorded  Encounter Date: 08/04/2015      OT End of Session - 08/04/15 1704    OT Start Time 1302   OT Stop Time 1416   OT Time Calculation (min) 74 min   Equipment Utilized During Treatment Test kit   Behavior During Therapy [facility] for tasks assessed/performed      Past Medical History  Diagnosis Date  . COPD (chronic obstructive pulmonary disease) ([place])   . Atrial fibrillation ([place])   . Hypertension   . Hypercholesteremia   . Pneumonia   . Diabetes mellitus without complication ([place])     type 2  . Headache     migraines - years ago  . Shortness of breath dyspnea   . Asthma   . Dysrhythmia     afib  . Chronic kidney disease     history of insufficiency  . Arthritis   . Spinal disorder     stenosis  . Complication of anesthesia     difficulty to wake up after an 8 hour back surgery  . Edema     legs/feet  . Wheezing     Past Surgical History  Procedure Laterality Date  . Abdominal hysterectomy    . Shoulder fusion surgery Right   . Knee surgery Right     knee replacement  . Hip surgery Right     hip replacement  . Back surgery    . Eye surgery      cataract surgery ? eye  . Fracture surgery      right arm 20 years ago  . Colonoscopy      x 3  . Orif distal radius fracture Left 06/17/2015  . Open reduction internal fixation (orif) distal radial fracture Left 06/17/2015    Procedure: OPEN REDUCTION INTERNAL FIXATION LEFT  DISTAL RADIUS AND ULNA FRACTURES;  Surgeon: Leandrew Koyanagi, MD;  Location: [facility];  Service: Orthopedics;  Laterality: Left;  . Joint replacement    . Hernia repair    . Cataract extraction w/phaco Left 07/28/2015    Procedure: CATARACT EXTRACTION PHACO  AND INTRAOCULAR LENS PLACEMENT (IOC);  Surgeon: Birder Robson, MD;  Location: [facility];  Service: Ophthalmology;  Laterality: Left;  [place] 2:16 AP% 27.4 CDE 37.45 fluid pack lot # [id number]    There were no vitals filed for this visit.  Visit Diagnosis:  Muscle weakness      Subjective Assessment - 08/04/15 1320    Subjective  I have very much pain most of the time.   Patient is accompained by: Family member   Pertinent History Husband   Currently in Pain? Yes   Pain Score 2   can be a 7 or an 8   Pain Location Wrist   Pain Orientation Left   Pain Descriptors / Indicators Sharp   Pain Type Surgical pain   Multiple Pain Sites No    This patient is a 81 year old female who came to [facility] out patient after a fall suffering a left wrist fracture. She lives with her husband and had been independent with all ADL including driving. She now has pain decreased range of motion and decreased strength in forearm, wrist and hand.  Active range of motion  in degrees forearm supination to 5, pronation 33, wrist extension 32, wrist flexion also 32, ulnar deviation 13, radial deviation 0, unable to make a full fist.  There is edema in her hand and she had made a wrist brace from a sock. She rates pain at a 2 now, but is up to an 8. She tends to hold her arm up to her chest. Instructed to try not to hold there too often.  Strength still needs to be measured. She needs moderate assist for bathing and dressing, she can not drive. She can not cook, she can not hold a plate or open jars. Issued a edema glove and had her and her husband put it on. Instructed to wear one hour at a time then 2 then 3 then up to 5 and check her fingers. Gloves have open tips to check finger tips. Patient reports glove is very comfortable. This Probation officer believes that this patient will be better served at [employer] branch as [employer] has equipment geared for her  diagnosis.                           OT Education - 08-25-2015 1328    Education provided Yes   Education Details educated on why it is important to move    Person(s) Educated Patient;Spouse   Methods Explanation   Comprehension Verbalized understanding                    Plan - Aug 25, 2015 1331    Clinical Impression Statement This patient is a 81 year old female who came to [facility] out patient after a wrist fracture with ORIF repair on October 29th 2016. At times she is in significant pain and interferes with her ADL. This includes cooking, dressing, bathing, driving,  She would benefit from OT for tange of motion, modaliies, strengthening and ADL training.   Pt will benefit from skilled therapeutic intervention in order to improve on the following deficits (Retired) Decreased coordination;Decreased endurance;Decreased scar mobility;Decreased range of motion;Decreased knowledge of use of DME;Decreased strength;Increased edema;Pain   Rehab Potential Good   OT Frequency 2x / week   OT Duration 12 weeks   OT Treatment/Interventions Self-care/ADL training;Neuromuscular education;Therapeutic exercise;Energy conservation;Manual Therapy;Scar mobilization;Passive range of motion;Splinting;Therapeutic exercises;Therapeutic activities   Plan OT 2 x per week for 12 weeks   Consulted and Agree with Plan of Care Patient;Family member/caregiver          G-Codes - 2015/08/25 1709    Self Care Current Status [phone]) At least 69 percent but less than 100 percent impaired, limited or restricted   Self Care Goal Status (G0174) At least 20 percent but less than 40 percent impaired, limited or restricted      Problem List Patient Active Problem List   Diagnosis Date Noted  . Acute respiratory failure with hypoxia ([place]) 07/17/2015  . Left upper lobe pneumonia 07/17/2015  . Hemoptysis 07/17/2015  . Atrial fibrillation with RVR ([place]) 07/17/2015  . Hypotension 07/17/2015  .  Renal insufficiency 07/17/2015  . Obesity 07/17/2015  . Metabolic encephalopathy 94/49/6759  . Fracture of radius, distal, with ulna, left, closed 06/17/2015  . S/P ORIF (open reduction internal fixation) fracture 06/17/2015  . Diaphragmatic hernia 04/07/2015  . Restrictive lung disease secondary to obesity 04/07/2015  . Pneumonia 04/07/2015  . COPD exacerbation ([place]) 04/07/2015  . Other emphysema ([place]) 04/07/2015  . Morbid obesity ([place]) 04/07/2015  . Debility 04/07/2015  . Sepsis (  [place]) 04/06/2015  . Chronic a-fib ([place]) 03/20/2015  . Acute respiratory distress ([place]) 03/16/2015   Sharon Mt, MS/OTR/L  Sharon Mt 08/04/2015, 5:13 PM  [place] MAIN [facility] SERVICES [address] [place], [place], [address] Phone: [phone]   Fax:  [phone]  Name: Allison Shepard MRN: [id number] Date of Birth: [birth year]

## 2015-08-06 ENCOUNTER — Ambulatory Visit: Payer: Medicare Other | Admitting: Occupational Therapy

## 2015-08-11 ENCOUNTER — Ambulatory Visit: Payer: Medicare Other | Admitting: Occupational Therapy

## 2015-08-12 ENCOUNTER — Encounter: Payer: Medicare Other | Admitting: Occupational Therapy

## 2015-08-19 ENCOUNTER — Ambulatory Visit: Payer: Medicare Other | Attending: Orthopaedic Surgery | Admitting: Occupational Therapy

## 2015-08-19 ENCOUNTER — Encounter: Payer: Self-pay | Admitting: Occupational Therapy

## 2015-08-19 DIAGNOSIS — M25632 Stiffness of left wrist, not elsewhere classified: Secondary | ICD-10-CM | POA: Insufficient documentation

## 2015-08-19 DIAGNOSIS — M25532 Pain in left wrist: Secondary | ICD-10-CM | POA: Insufficient documentation

## 2015-08-19 DIAGNOSIS — M6281 Muscle weakness (generalized): Secondary | ICD-10-CM | POA: Diagnosis present

## 2015-08-19 DIAGNOSIS — S62102A Fracture of unspecified carpal bone, left wrist, initial encounter for closed fracture: Secondary | ICD-10-CM | POA: Diagnosis not present

## 2015-08-19 DIAGNOSIS — X58XXXA Exposure to other specified factors, initial encounter: Secondary | ICD-10-CM | POA: Diagnosis not present

## 2015-08-19 NOTE — Patient Instructions (Signed)
Pt was instructed in HEP for LUE (putty for grip; prayer stretch/wrist extension; active RD/UD; forearm pronation/supination A/AROM and gentle PROM; active wrist flexion/extension. Pt performed all of the above in clinic after Min vc's, tc's and demonstration. Pt requires encouragement to use left UE as she frequently states that "I can't do that" before attempting AROM and A/AROM and functional use. Pt/husband were instructed that pt should not keep LUE in preferred protected position (of elbow flexion and forearm resting on her chest). Encouraged functional activity for ADL's and elbow extension at rest and when sleeping (examples given verbally). Pt/husband verbalized understanding of all of the above.

## 2015-08-19 NOTE — Therapy (Signed)
[facility] PHYS[facility] AND SPOR[facility] MEDICINE [address] [place], [place], [address] Phone: [phone]   Fax:  [phone]  Occupational Therapy Treatment  Patient Details  Name: Allison Shepard MRN: [id number] Date of Birth: 11-19-1933 No Data Recorded  Encounter Date: 08/19/2015      OT End of Session - 08/19/15 1257    Visit Number 224   Date for OT Re-Evaluation 11/02/15   OT Start Time 1035   OT Stop Time 1108   OT Time Calculation (min) 33 min   Equipment Utilized During Treatment Light blue putty   Activity Tolerance Patient tolerated treatment well   Behavior During Therapy [facility] for tasks assessed/performed;Anxious      Past Medical History  Diagnosis Date  . COPD (chronic obstructive pulmonary disease) (HCC)   . Atrial fibrillation (HCC)   . Hypertension   . Hypercholesteremia   . Pneumonia   . Diabetes mellitus without complication (HCC)     type 2  . Headache     migraines - years ago  . Shortness of breath dyspnea   . Asthma   . Dysrhythmia     afib  . Chronic kidney disease     history of insufficiency  . Arthritis   . Spinal disorder     stenosis  . Complication of anesthesia     difficulty to wake up after an 8 hour back surgery  . Edema     legs/feet  . Wheezing     Past Surgical History  Procedure Laterality Date  . Abdominal hysterectomy    . Shoulder fusion surgery Right   . Knee surgery Right     knee replacement  . Hip surgery Right     hip replacement  . Back surgery    . Eye surgery      cataract surgery ? eye  . Fracture surgery      right arm 20 years ago  . Colonoscopy      x 3  . Orif distal radius fracture Left 06/17/2015  . Open reduction internal fixation (orif) distal radial fracture Left 06/17/2015    Procedure: OPEN REDUCTION INTERNAL FIXATION LEFT  DISTAL RADIUS AND ULNA FRACTURES;  Surgeon: Tarry KosNaiping M Xu, MD;  Location: [facility] OR;  Service: Orthopedics;  Laterality: Left;  . Joint  replacement    . Hernia repair    . Cataract extraction w/phaco Left 07/28/2015    Procedure: CATARACT EXTRACTION PHACO AND INTRAOCULAR LENS PLACEMENT (IOC);  Surgeon: Galen ManilaWilliam Porfilio, MD;  Location: [facility] ORS;  Service: Ophthalmology;  Laterality: Left;  US 2:16 AP% 27.4 CDE 37.45 fluid pack lot # [id number]    There were no vitals filed for this visit.  Visit Diagnosis:  Wrist fracture, left, closed, initial encounter  Left wrist pain  Wrist stiffness, left  Muscle weakness      Subjective Assessment - 08/19/15 1041    Subjective  "I have pain all the time and I like to keep it here" Protected position. Pt was educated to not keep L UE in protected position secondary to causing decreased AROM and increased stiffness of left elbow and shoulder etc. Pt verbalized understanding of this today.   Patient is accompained by: Family member   Pertinent History Husband   Currently in Pain? Yes   Pain Score 2    Pain Location Wrist   Pain Orientation Left   Pain Descriptors / Indicators Sore   Pain Type Chronic pain;Surgical pain   Pain  Onset More than a month ago   Pain Frequency Constant   Pain Relieving Factors Holding LUE in protected position   Multiple Pain Sites No                      OT Treatments/Exercises (OP) - 08/19/15 0001    ADLs   Overall ADLs Pt reports that her husband is assisting her with bathing , pulliing up pants, opening jars etc.    Exercises   Exercises Shoulder;Elbow;Wrist;Hand   Elbow Exercises   Elbow Extension AROM;Left;5 reps;Seated   Forearm Supination AAROM;PROM;Left;10 reps;Seated   Forearm Pronation AAROM;Left;10 reps;Seated   Wrist Flexion AROM;Left;10 reps;Seated   Wrist Extension AROM;Left;10 reps;Seated;PROM  PROM: prayer stretch   Other elbow exercises Do not keep left UE in protected position. Pt tends to favor this and may be experiencing forearm pain as she has kept her arm in this position since fracture of left wrist  05/2015.   Wrist Exercises   Wrist Radial Deviation AROM;Left;5 reps;Seated   Wrist Ulnar Deviation AROM;5 reps;Seated;Left   Hand Exercises   Other Hand Exercises Grip strengthening using light blue putty for grip 10-15 reps 1-2x/day.  Pt performed in clinic after demonstration, written and vc's      Pt/husband were instructed in the following: Pt was instructed in HEP for LUE (putty for grip; prayer stretch/wrist extension; active RD/UD; forearm pronation/supination A/AROM and gentle PROM; active wrist flexion/extension. Pt performed all of the above in clinic after Min vc's, tc's and demonstration. Pt requires encouragement to use left UE as she frequently states that "I can't do that" before attempting AROM and A/AROM and functional use. Pt/husband were instructed that pt should not keep LUE in preferred protected position (of elbow flexion and forearm resting on her chest). Encouraged functional activity for ADL's and elbow extension at rest and when sleeping (examples given verbally). Pt/husband verbalized understanding of all of the above.          OT Education - 08/19/15 1256    Education provided Yes   Education Details HEP; increased functinoall use LUE   Person(s) Educated Patient;Spouse   Methods Explanation;Demonstration;Verbal cues;Handout   Comprehension Verbalized understanding             OT Long Term Goals - 08/04/15 1741    OT LONG TERM GOAL #1   Title Patient will improve range of motion of forearm and wrist by 20 degees each motion to be able to dress herself.   Baseline Requires moderate assist    Time 12   Period Weeks   Status New   OT LONG TERM GOAL #2   Title Patient will improve grip in her left hand to be able to hold a plate.   Baseline Unable to hold a plate in left hand.   Time 12   Period Weeks   Status New   OT LONG TERM GOAL #3   Title Patient will improve range of motion in forearm to be able to take a bath herself with set up.    Baseline Patient recieves moderate assistance from her husband.   Time 12   Period Weeks   Status New   OT LONG TERM GOAL #4   Title Patient will improve grip in her left hand to be able to open jars with or with out assistive devices.   Baseline Patient is not able to open jars.   Time 12   Period Weeks   Status New  OT LONG TERM GOAL #5   Title Patient will improve range and strength in her left hand to be able to cook.   Baseline Patient has not been able to cook.   Time 12   Period Weeks   Status New   Long Term Additional Goals   Additional Long Term Goals Yes               Plan - 08/19/15 1258    Clinical Impression Statement Pt should benfit from performace of HEP and functional use left UE for daily activities. Will follow as out-pt for pt/family education, functional use, HEP/AROM/PROM and strengthening.   Pt will benefit from skilled therapeutic intervention in order to improve on the following deficits (Retired) Decreased coordination;Decreased endurance;Decreased scar mobility;Decreased range of motion;Decreased knowledge of use of DME;Decreased strength;Increased edema;Pain   Rehab Potential Good   OT Frequency 2x / week   OT Duration 12 weeks   OT Treatment/Interventions Self-care/ADL training;Neuromuscular education;Therapeutic exercise;Energy conservation;Manual Therapy;Scar mobilization;Passive range of motion;Splinting;Therapeutic exercises;Therapeutic activities;Patient/family education;Moist Heat;Fluidtherapy;Parrafin   Plan Review home program, ther ex, functional use. Gentle PROM LUE   Consulted and Agree with Plan of Care Patient;Family member/caregiver   Family Member Consulted Husband        Problem List Patient Active Problem List   Diagnosis Date Noted  . Acute respiratory failure with hypoxia (HCC) 07/17/2015  . Left upper lobe pneumonia 07/17/2015  . Hemoptysis 07/17/2015  . Atrial fibrillation with RVR (HCC) 07/17/2015  . Hypotension  07/17/2015  . Renal insufficiency 07/17/2015  . Obesity 07/17/2015  . Metabolic encephalopathy 07/17/2015  . Fracture of radius, distal, with ulna, left, closed 06/17/2015  . S/P ORIF (open reduction internal fixation) fracture 06/17/2015  . Diaphragmatic hernia 04/07/2015  . Restrictive lung disease secondary to obesity 04/07/2015  . Pneumonia 04/07/2015  . COPD exacerbation (HCC) 04/07/2015  . Other emphysema (HCC) 04/07/2015  . Morbid obesity (HCC) 04/07/2015  . Debility 04/07/2015  . Sepsis (HCC) 04/06/2015  . Chronic a-fib (HCC) 03/20/2015  . Acute respiratory distress (HCC) 03/16/2015    Anissia Wessells Dionicio Stall, OTR/L 08/19/2015, 1:05 PM  [place] [facility] PHYSICAL AND SPORTS MEDICINE [address] [place], [address] Phone: [phone]   Fax:  [phone]  Name: Allison Shepard MRN: [id number] Date of Birth: 07/14/1934

## 2015-08-26 ENCOUNTER — Ambulatory Visit: Payer: Medicare Other | Admitting: Physical Therapy

## 2015-08-28 ENCOUNTER — Ambulatory Visit: Payer: Medicare Other | Admitting: Occupational Therapy

## 2015-08-28 DIAGNOSIS — S62102A Fracture of unspecified carpal bone, left wrist, initial encounter for closed fracture: Secondary | ICD-10-CM | POA: Diagnosis not present

## 2015-08-28 DIAGNOSIS — M25632 Stiffness of left wrist, not elsewhere classified: Secondary | ICD-10-CM

## 2015-08-28 DIAGNOSIS — M25532 Pain in left wrist: Secondary | ICD-10-CM

## 2015-08-28 DIAGNOSIS — M6281 Muscle weakness (generalized): Secondary | ICD-10-CM

## 2015-08-28 NOTE — Therapy (Signed)
[place] [facility] PHYSICAL [facility] SP[facility]TS MEDICINE [address] [place], [place], [address] Phone: [phone]   Fax:  [phone]  Occupational Therapy Treatment  Patient Details  Name: Allison HeadsMargaret M Melrose MRN: [id number] Date of Birth: 05-23-1934 No Data Recorded  Encounter Date: 08/28/2015      OT End of Session - 08/28/15 1431    Visit Number 3   Number of Visits 24   Date for OT Re-Evaluation 11/02/15   OT Start Time 1231   OT Stop Time 1325   OT Time Calculation (min) 54 min   Activity Tolerance Patient limited by pain;Patient tolerated treatment well   Behavior During Therapy [facility] for tasks assessed/performed      Past Medical History  Diagnosis Date  . COPD (chronic obstructive pulmonary disease) (HCC)   . Atrial fibrillation (HCC)   . Hypertension   . Hypercholesteremia   . Pneumonia   . Diabetes mellitus without complication (HCC)     type 2  . Headache     migraines - years ago  . Shortness of breath dyspnea   . Asthma   . Dysrhythmia     afib  . Chronic kidney disease     history of insufficiency  . Arthritis   . Spinal disorder     stenosis  . Complication of anesthesia     difficulty to wake up after an 8 hour back surgery  . Edema     legs/feet  . Wheezing     Past Surgical History  Procedure Laterality Date  . Abdominal hysterectomy    . Shoulder fusion surgery Right   . Knee surgery Right     knee replacement  . Hip surgery Right     hip replacement  . Back surgery    . Eye surgery      cataract surgery ? eye  . Fracture surgery      right arm 20 years ago  . Colonoscopy      x 3  . Orif distal radius fracture Left 06/17/2015  . Open reduction internal fixation (orif) distal radial fracture Left 06/17/2015    Procedure: OPEN REDUCTION INTERNAL FIXATION LEFT  DISTAL RADIUS AND ULNA FRACTURES;  Surgeon: Tarry KosNaiping M Xu, MD;  Location: [facility] OR;  Service: Orthopedics;  Laterality: Left;  . Joint replacement    .  Hernia repair    . Cataract extraction w/phaco Left 07/28/2015    Procedure: CATARACT EXTRACTION PHACO AND INTRAOCULAR LENS PLACEMENT (IOC);  Surgeon: Galen ManilaWilliam Porfilio, MD;  Location: [facility] ORS;  Service: Ophthalmology;  Laterality: Left;  US 2:16 AP% 27.4 CDE 37.45 fluid pack lot # [id number]    There were no vitals filed for this visit.  Visit Diagnosis:  Left wrist pain  Wrist stiffness, left  Muscle weakness      Subjective Assessment - 08/28/15 1244    Subjective  My fingers are moving better , I can grip now when peeling apple and force myself to use it - pain still bad - it do not hurt if I keep it agains myself   Patient Stated Goals I want to use it more, and get the pain better    Currently in Pain? Yes   Pain Score 2    Pain Location Wrist   Pain Descriptors / Indicators Aching                      OT Treatments/Exercises (OP) - 08/28/15 0001  Wrist Exercises   Other wrist exercises PROM and AAROM for supination done - prayer stretch for wrist extnetion done , and flexion PROM 10 reps each - reviewed with husband present too    Hand Exercises   Other Hand Exercises upgrade ot teal putty for gripping but only 2 -3 x day for 2 x 10 reps    LUE Paraffin   Number Minutes Paraffin 10 Minutes   LUE Paraffin Location Wrist;Hand   Comments With heatingpad but wrist in neutral to increase ROM    Manual Therapy   Manual therapy comments SOft tissue mobs to extendor mass of forearm - triggerpoints present - she over did the light blue putty - scar mobs done on palmar wrist scar and Radial side of wrist - pt ed and husband ed on doing - scar pad issues and edcuation done on wearing at night time                 OT Education - 08/28/15 1430    Education Details HEP , ed on using and scar massage    Person(s) Educated Patient;Spouse   Methods Explanation;Demonstration;Tactile cues;Handout;Verbal cues   Comprehension Verbal cues required;Returned  demonstration;Verbalized understanding             OT Long Term Goals - 08/04/15 1741    OT LONG TERM GOAL #1   Title Patient will improve range of motion of forearm and wrist by 20 degees each motion to be able to dress herself.   Baseline Requires moderate assist    Time 12   Period Weeks   Status New   OT LONG TERM GOAL #2   Title Patient will improve grip in her left hand to be able to hold a plate.   Baseline Unable to hold a plate in left hand.   Time 12   Period Weeks   Status New   OT LONG TERM GOAL #3   Title Patient will improve range of motion in forearm to be able to take a bath herself with set up.   Baseline Patient recieves moderate assistance from her husband.   Time 12   Period Weeks   Status New   OT LONG TERM GOAL #4   Title Patient will improve grip in her left hand to be able to open jars with or with out assistive devices.   Baseline Patient is not able to open jars.   Time 12   Period Weeks   Status New   OT LONG TERM GOAL #5   Title Patient will improve range and strength in her left hand to be able to cook.   Baseline Patient has not been able to cook.   Time 12   Period Weeks   Status New   Long Term Additional Goals   Additional Long Term Goals Yes               Plan - 08/28/15 1432    Clinical Impression Statement Pt cont with decrease supination  more than Wrist flexion and extnetion - and then pt report increase pain over radial side of forearm and extensors - still wants to carry hand or old it against her body - but she do not wear splint anymore and use it more sounds like - pt to not over do putty nad issues scar mobs and scar pad this date    Pt will benefit from skilled therapeutic intervention in order to improve on the following deficits (Retired) Decreased  coordination;Decreased endurance;Decreased scar mobility;Decreased range of motion;Decreased knowledge of use of DME;Decreased strength;Increased edema;Pain   Rehab  Potential Good   OT Frequency 2x / week   OT Duration 8 weeks   OT Treatment/Interventions Self-care/ADL training;Neuromuscular education;Therapeutic exercise;Energy conservation;Manual Therapy;Scar mobilization;Passive range of motion;Splinting;Therapeutic exercises;Therapeutic activities;Patient/family education;Moist Heat;Fluidtherapy;Parrafin   Plan assess supination and grip strenght - and pain    OT Home Exercise Plan see pt instruction    Consulted and Agree with Plan of Care Patient;Family member/caregiver   Family Member Consulted Husband        Problem List Patient Active Problem List   Diagnosis Date Noted  . Acute respiratory failure with hypoxia (HCC) 07/17/2015  . Left upper lobe pneumonia 07/17/2015  . Hemoptysis 07/17/2015  . Atrial fibrillation with RVR (HCC) 07/17/2015  . Hypotension 07/17/2015  . Renal insufficiency 07/17/2015  . Obesity 07/17/2015  . Metabolic encephalopathy 07/17/2015  . Fracture of radius, distal, with ulna, left, closed 06/17/2015  . S/P ORIF (open reduction internal fixation) fracture 06/17/2015  . Diaphragmatic hernia 04/07/2015  . Restrictive lung disease secondary to obesity 04/07/2015  . Pneumonia 04/07/2015  . COPD exacerbation (HCC) 04/07/2015  . Other emphysema (HCC) 04/07/2015  . Morbid obesity (HCC) 04/07/2015  . Debility 04/07/2015  . Sepsis (HCC) 04/06/2015  . Chronic a-fib (HCC) 03/20/2015  . Acute respiratory distress (HCC) 03/16/2015    Oletta Cohn OTR/l,CLT 08/28/2015, 2:36 PM  [place] [facility] PHYSICAL AND SPORTS MEDICINE [address] [place], [address] Phone: [phone]   Fax:  [phone]  Name: KAHLANI GRABER MRN: [id number] Date of Birth: [birth year]

## 2015-08-28 NOTE — Patient Instructions (Signed)
Scar massage and scar pad at night time 3 x day massage  Heat prior to ROM  And supination stretch 3 x day with heating pad - keep for 2-5 min  Prayer stretch and wrist flexion PROM - 10 reps each   teal putty 2 x day 2 x 10 reps only

## 2015-09-04 ENCOUNTER — Encounter: Payer: Medicare Other | Admitting: Occupational Therapy

## 2015-09-08 ENCOUNTER — Ambulatory Visit (INDEPENDENT_AMBULATORY_CARE_PROVIDER_SITE_OTHER): Payer: Medicare Other | Admitting: Pulmonary Disease

## 2015-09-08 ENCOUNTER — Ambulatory Visit
Admission: RE | Admit: 2015-09-08 | Discharge: 2015-09-08 | Disposition: A | Discharge status: Home / Self Care | Payer: Medicare Other | Source: Ambulatory Visit | Attending: Pulmonary Disease | Admitting: Pulmonary Disease

## 2015-09-08 ENCOUNTER — Ambulatory Visit: Payer: Medicare Other | Admitting: Occupational Therapy

## 2015-09-08 ENCOUNTER — Encounter: Payer: Self-pay | Admitting: Pulmonary Disease

## 2015-09-08 VITALS — BP 124/86 | HR 86 | Ht 64.0 in | Wt 241.0 lb

## 2015-09-08 DIAGNOSIS — M6281 Muscle weakness (generalized): Secondary | ICD-10-CM

## 2015-09-08 DIAGNOSIS — R0902 Hypoxemia: Secondary | ICD-10-CM | POA: Diagnosis not present

## 2015-09-08 DIAGNOSIS — J13 Pneumonia due to Streptococcus pneumoniae: Secondary | ICD-10-CM | POA: Insufficient documentation

## 2015-09-08 DIAGNOSIS — M25532 Pain in left wrist: Secondary | ICD-10-CM

## 2015-09-08 DIAGNOSIS — J189 Pneumonia, unspecified organism: Secondary | ICD-10-CM | POA: Diagnosis not present

## 2015-09-08 DIAGNOSIS — S62102A Fracture of unspecified carpal bone, left wrist, initial encounter for closed fracture: Secondary | ICD-10-CM | POA: Diagnosis not present

## 2015-09-08 DIAGNOSIS — Z87891 Personal history of nicotine dependence: Secondary | ICD-10-CM

## 2015-09-08 DIAGNOSIS — J449 Chronic obstructive pulmonary disease, unspecified: Secondary | ICD-10-CM

## 2015-09-08 DIAGNOSIS — R918 Other nonspecific abnormal finding of lung field: Secondary | ICD-10-CM | POA: Diagnosis not present

## 2015-09-08 DIAGNOSIS — M25632 Stiffness of left wrist, not elsewhere classified: Secondary | ICD-10-CM

## 2015-09-08 NOTE — Therapy (Signed)
[place] [facility] PH[facility] SPORTS MEDICINE [address] [place], [address] Phone: [phone]   Fax:  [phone]  Occupational Therapy Treatment  Patient Details  Name: Allison Shepard MRN: [id number] Date of Birth: [birth year] No Data Recorded  Encounter Date: 09/08/2015      OT End of Session - 09/08/15 1503    Visit Number 4   Number of Visits 24   Date for OT Re-Evaluation 11/02/15   OT Start Time 1351   OT Stop Time 1445   OT Time Calculation (min) 54 min   Activity Tolerance Patient tolerated treatment well   Behavior During Therapy [facility] for tasks assessed/performed      Past Medical History  Diagnosis Date  . COPD (chronic obstructive pulmonary disease) (HCC)   . Atrial fibrillation (HCC)   . Hypertension   . Hypercholesteremia   . Pneumonia   . Diabetes mellitus without complication (HCC)     type 2  . Headache     migraines - years ago  . Shortness of breath dyspnea   . Asthma   . Dysrhythmia     afib  . Chronic kidney disease     history of insufficiency  . Arthritis   . Spinal disorder     stenosis  . Complication of anesthesia     difficulty to wake up after an 8 hour back surgery  . Edema     legs/feet  . Wheezing     Past Surgical History  Procedure Laterality Date  . Abdominal hysterectomy    . Shoulder fusion surgery Right   . Knee surgery Right     knee replacement  . Hip surgery Right     hip replacement  . Back surgery    . Eye surgery      cataract surgery ? eye  . Fracture surgery      right arm 20 years ago  . Colonoscopy      x 3  . Orif distal radius fracture Left 06/17/2015  . Open reduction internal fixation (orif) distal radial fracture Left 06/17/2015    Procedure: OPEN REDUCTION INTERNAL FIXATION LEFT  DISTAL RADIUS AND ULNA FRACTURES;  Surgeon: Tarry Kos, MD;  Location: [facility] OR;  Service: Orthopedics;  Laterality: Left;  . Joint replacement    . Hernia repair    .  Cataract extraction w/phaco Left 07/28/2015    Procedure: CATARACT EXTRACTION PHACO AND INTRAOCULAR LENS PLACEMENT (IOC);  Surgeon: Galen Manila, MD;  Location: [facility] ORS;  Service: Ophthalmology;  Laterality: Left;  [place] 2:16 AP% 27.4 CDE 37.45 fluid pack lot # [id number]    There were no vitals filed for this visit.  Visit Diagnosis:  Left wrist pain  Wrist stiffness, left  Muscle weakness      Subjective Assessment - 09/08/15 1457    Subjective  I am doing better -using my hand more - like pick up water picher, undo my seatbelt , cut onions,- most pain still on the side of my wrist - I did hold off on the putty   Patient Stated Goals I want to use it more, and get the pain better    Currently in Pain? Yes   Pain Score 2    Pain Location Wrist   Pain Orientation Left   Pain Descriptors / Indicators Aching   Pain Onset More than a month ago      Grip L 15, R 32 Lat grip L 6, R 15  3 point grip L 6, L 13 lbs                 OT Treatments/Exercises (OP) - 09/08/15 0001    Wrist Exercises   Other wrist exercises PROM and stretch for supination in heatingpad and afterwards ; prayer stretch for wrist extention    Other wrist exercises add 16 hammer or 1 lbs for sup/pro, UD and RD , wrist flexion .extention 10 reps each - 2 xday   Hand Exercises   Other Hand Exercises same teal putty - but focus on grip , lat grip and add pulling with with finger tips    Other Hand Exercises Grip and prehension decrease - see flowsheet   Moist Heat Therapy   Number Minutes Moist Heat 10 Minutes   Moist Heat Location Wrist   Manual Therapy   Manual therapy comments Scar mobs done again - scar on ulnar side of wrist more adhere - pt ed on scar massage again - and provided new scar pad - pt and husband report  previous scar pad not sticking anymore                 OT Education - 09/08/15 1502    Education provided Yes   Education Details see pt instruction - scar pad use     Person(s) Educated Patient;Spouse   Methods Explanation;Demonstration;Tactile cues;Verbal cues;Handout   Comprehension Returned demonstration;Verbal cues required;Verbalized understanding             OT Long Term Goals - 09/08/15 1505    OT LONG TERM GOAL #1   Title Patient will improve range of motion of forearm and wrist by 20 degees each motion to be able to dress herself.   Baseline min A - still limted in supination    Time 4   Period Weeks   Status On-going   OT LONG TERM GOAL #2   Title Patient will improve grip in her left hand to be able to hold a plate.   Baseline grip 15 lbs L, R 32    Time 4   Period Weeks   Status On-going   OT LONG TERM GOAL #3   Title Patient will improve range of motion in forearm to be able to take a bath herself with set up.   Baseline SBA with bathing - suination still impaired    Time 4   Status On-going   OT LONG TERM GOAL #4   Title Patient will improve grip in her left hand to be able to open jars with or with out assistive devices.   Baseline still needs help with jar   Time 4   Period Weeks   Status On-going   OT LONG TERM GOAL #5   Title Patient will improve range and strength in her left hand to be able to cook.   Baseline started cooking    Status Achieved               Plan - 09/08/15 1504    Clinical Impression Statement Pt pain better this date and increase functional use - pt still impaired mostly in strength, ROM at supination - and grip/prehension strength- pt HEP updated and reviewed with pt and husband - cont to increase ind and use of L hand /wrist -    Pt will benefit from skilled therapeutic intervention in order to improve on the following deficits (Retired) Decreased coordination;Decreased endurance;Decreased scar mobility;Decreased range of motion;Decreased knowledge of use of  DME;Decreased strength;Increased edema;Pain   Rehab Potential Good   OT Frequency 1x / week   OT Duration 4 weeks   Plan  assess supination ,  update HEP    OT Home Exercise Plan see pt instruction    Consulted and Agree with Plan of Care Patient;Family member/caregiver   Family Member Consulted Husband        Problem List Patient Active Problem List   Diagnosis Date Noted  . Acute respiratory failure with hypoxia (HCC) 07/17/2015  . Left upper lobe pneumonia 07/17/2015  . Hemoptysis 07/17/2015  . Atrial fibrillation with RVR (HCC) 07/17/2015  . Hypotension 07/17/2015  . Renal insufficiency 07/17/2015  . Obesity 07/17/2015  . Metabolic encephalopathy 07/17/2015  . Fracture of radius, distal, with ulna, left, closed 06/17/2015  . S/P ORIF (open reduction internal fixation) fracture 06/17/2015  . Diaphragmatic hernia 04/07/2015  . Restrictive lung disease secondary to obesity 04/07/2015  . Pneumonia 04/07/2015  . COPD exacerbation (HCC) 04/07/2015  . Other emphysema (HCC) 04/07/2015  . Morbid obesity (HCC) 04/07/2015  . Debility 04/07/2015  . Sepsis (HCC) 04/06/2015  . Chronic a-fib (HCC) 03/20/2015  . Acute respiratory distress (HCC) 03/16/2015    Oletta Cohn OTR/L,CLT 09/08/2015, 3:08 PM  [place] [facility] PHYSICAL AND SPORTS MEDICINE [address] [place], [address] Phone: [phone]   Fax:  [phone]  Name: NOVIA LANSBERRY MRN: [id number] Date of Birth: 04/09/1934

## 2015-09-08 NOTE — Progress Notes (Signed)
PULMONARY OFFICE FOLLOW UP  PROBLEMS: Smoking history Chronic dyspnea, presumed COPD Recent pneumococcal PNA  Pt PROFILE: 64 F admitted 12/02-12/05/16 when she presented with febrile illness and severe AMS. Heer CXR and CT chest revealed dense L lung infiltrate. Her BC's were + for pneumococcus. She was seen in consultation by Dr Dema Severin and this F/U was arranged.   SUBJ: She has no new complaints but expresses great frustration with the degree of exertional limitation and need for supplemental O2. She came to this encounter without O2 and her resting RA SpO2 was 87%. The oximetry reading below is on 2 lpm [place]. Breo was started last visit. She does not believe that it has helped anything.   OBJ: Filed Vitals:   09/08/15 1046  BP: 124/86  Pulse: 86  Height:  (1.626 m)  Weight: 241 lb (109.317 kg)  SpO2: 95%   Chronically ill appearing, no overt distress, centripetal obesity HEENT: All WNL Neck: NO LAN, no JVD noted Lungs: full BS, normal percussion note throughout, scattered wheezes Cardiovascular: IRIR, rate controlled, no M noted Abdomen: Soft, NT +BS Ext: 2+ symmetric pretibial edema with chronicstasis changes Neuro: intact  DATA: CXR 09/08/15: near complete resolution of LUL infiltrate  IMPRESSION:   ICD-9-CM ICD-10-CM   1. Chronic obstructive pulmonary disease, unspecified COPD type (HCC) 496 J44.9   2. Hypoxia 799.02 R09.02   3. Former smoker V15.82 Z87.891   4. Pneumonia, unspecified laterality, unspecified part of lung 486 J18.9   5. Dyspnea - due to COPD and obesity  PLAN: Trial off Breo. Resume if symptoms worsen Emphasized need for [place] O2 as close as 24 hrs/day as possible. We will work to Recruitment consultant for her Discussed weight loss and its potential benefits. She is to work with her endocrinologist on this ROV 3-4 months   Merwyn Katos, MD [facility] [place] [facility]

## 2015-09-08 NOTE — Patient Instructions (Signed)
Heat with supination stretch Prayer stretch  Hammer 16 oz or 1 lbs weight for wrist in all planes - hand out provided - 10 reps 2 x day   Teal putty - grip , lat grip and pulling with all digits  10-12 reps  2 x day

## 2015-09-09 ENCOUNTER — Telehealth: Payer: Self-pay | Admitting: *Deleted

## 2015-09-09 DIAGNOSIS — J449 Chronic obstructive pulmonary disease, unspecified: Secondary | ICD-10-CM

## 2015-09-09 NOTE — Telephone Encounter (Signed)
Small portable tanks ordered for pt.

## 2015-09-16 ENCOUNTER — Ambulatory Visit: Payer: Medicare Other | Attending: Orthopaedic Surgery | Admitting: Occupational Therapy

## 2015-09-16 DIAGNOSIS — M25632 Stiffness of left wrist, not elsewhere classified: Secondary | ICD-10-CM | POA: Insufficient documentation

## 2015-09-16 DIAGNOSIS — M25532 Pain in left wrist: Secondary | ICD-10-CM | POA: Diagnosis present

## 2015-09-16 DIAGNOSIS — M6281 Muscle weakness (generalized): Secondary | ICD-10-CM | POA: Diagnosis present

## 2015-09-16 NOTE — Therapy (Signed)
[facility] [address], [place], [address] Phone: [phone]   Fax:  [phone]  Occupational Therapy Treatment and discharge  Patient Details  Name: Allison Shepard MRN: [id number] Date of Birth: 1933-10-19 No Data Recorded  Encounter Date: 09/16/2015      OT End of Session - 09/16/15 1457    Visit Number 5   Number of Visits 5   Date for OT Re-Evaluation 09/16/15   OT Start Time 0940   OT Stop Time 1016   OT Time Calculation (min) 36 min   Activity Tolerance Patient tolerated treatment well   Behavior During Therapy [facility] for tasks assessed/performed      Past Medical History  Diagnosis Date  . COPD (chronic obstructive pulmonary disease) ([place])   . Atrial fibrillation ([place])   . Hypertension   . Hypercholesteremia   . Pneumonia   . Diabetes mellitus without complication ([place])     type 2  . Headache     migraines - years ago  . Shortness of breath dyspnea   . Asthma   . Dysrhythmia     afib  . Chronic kidney disease     history of insufficiency  . Arthritis   . Spinal disorder     stenosis  . Complication of anesthesia     difficulty to wake up after an 8 hour back surgery  . Edema     legs/feet  . Wheezing     Past Surgical History  Procedure Laterality Date  . Abdominal hysterectomy    . Shoulder fusion surgery Right   . Knee surgery Right     knee replacement  . Hip surgery Right     hip replacement  . Back surgery    . Eye surgery      cataract surgery ? eye  . Fracture surgery      right arm 20 years ago  . Colonoscopy      x 3  . Orif distal radius fracture Left 06/17/2015  . Open reduction internal fixation (orif) distal radial fracture Left 06/17/2015    Procedure: OPEN REDUCTION INTERNAL FIXATION LEFT  DISTAL RADIUS AND ULNA FRACTURES;  Surgeon: Leandrew Koyanagi, MD;  Location: [facility];  Service: Orthopedics;  Laterality: Left;  . Joint replacement    . Hernia  repair    . Cataract extraction w/phaco Left 07/28/2015    Procedure: CATARACT EXTRACTION PHACO AND INTRAOCULAR LENS PLACEMENT (IOC);  Surgeon: Birder Robson, MD;  Location: [facility];  Service: Ophthalmology;  Laterality: Left;  [place] 2:16 AP% 27.4 CDE 37.45 fluid pack lot # [id number]    There were no vitals filed for this visit.  Visit Diagnosis:  Left wrist pain  Wrist stiffness, left  Muscle weakness      Subjective Assessment - 09/16/15 1452    Subjective  I am using my hand with out really thinking of it - not really pain except when I do something weird - like strethc palm over or something heavy    Patient Stated Goals I want to use it more, and get the pain better    Currently in Pain? No/denies            [facility] OT Assessment - 09/16/15 0001    AROM   Right Wrist Extension 65 Degrees   Right Wrist Flexion 70 Degrees   Left Wrist Extension 60 Degrees   Left Wrist Flexion 56 Degrees   Strength  Right Hand Grip (lbs) 31   Right Hand Lateral Pinch 14 lbs   Right Hand 3 Point Pinch 13 lbs   Left Hand Grip (lbs) 20   Left Hand Lateral Pinch 7 lbs   Left Hand 3 Point Pinch 7 lbs                  OT Treatments/Exercises (OP) - 09/16/15 0001    Wrist Exercises   Other wrist exercises  PROM for supination    Other wrist exercises measurements taken - sup after manual again - gained 10 degrees    Hand Exercises   Other Hand Exercises measurement taken    Moist Heat Therapy   Number Minutes Moist Heat 10 Minutes   Moist Heat Location Wrist   Manual Therapy   Manual therapy comments Done graston after heat on palmar and dorsal side of forearm and wrist - with toll 2 and 4 at 30-60% angle - using sweeping, scooping nad fannint - as well as brusing around scars - pt not as tender  over ulnar scar - and did somw swivel with 2 and 4 inbeween Radius and ulnar - gained 10 degrees of sup afterward  and ulnar scar moving better and less pain /tenderness                  OT Education - 09/16/15 1457    Education provided Yes   Education Details HEP   Person(s) Educated Patient   Methods Explanation;Demonstration;Verbal cues   Comprehension Verbal cues required;Returned demonstration;Verbalized understanding             OT Long Term Goals - 09/16/15 1500    OT LONG TERM GOAL #1   Title Patient will improve range of motion of forearm and wrist by 20 degees each motion to be able to dress herself.   Baseline supination still 55-65 degrees   Status Partially Met   OT LONG TERM GOAL #2   Title Patient will improve grip in her left hand to be able to hold a plate.   Baseline grip 20 , L 32    Status Achieved   OT LONG TERM GOAL #3   Title Patient will improve range of motion in forearm to be able to take a bath herself with set up.   Status Achieved   OT LONG TERM GOAL #4   Title Patient will improve grip in her left hand to be able to open jars with or with out assistive devices.   Status Achieved   OT LONG TERM GOAL #5   Title Patient will improve range and strength in her left hand to be able to cook.   Baseline cooking , cutting , stirring    Status Achieved               Plan - 09/16/15 1458    Clinical Impression Statement Pt showed great progress in pain, functional use of  L hand  and ROM the last 3 wks  still cont to be limited in supination and grip strength -but pt has great function - on PRWHE for pain she is 3/50; function 5.5/50 - pt can cont wth HEP at home  as well as scar massage /scar pad - pt  in agreement  - will be discharge with HEP    OT Treatment/Interventions Self-care/ADL training;Neuromuscular education;Therapeutic exercise;Energy conservation;Manual Therapy;Scar mobilization;Passive range of motion;Splinting;Therapeutic exercises;Therapeutic activities;Patient/family education;Moist Heat;Fluidtherapy;Parrafin   Plan discharge with HEP   OT Home Exercise Plan  see pt instruction    Consulted  and Agree with Plan of Care Patient;Family member/caregiver   Family Member Consulted Husband        Problem List Patient Active Problem List   Diagnosis Date Noted  . Acute respiratory failure with hypoxia ([place]) 07/17/2015  . Left upper lobe pneumonia 07/17/2015  . Hemoptysis 07/17/2015  . Atrial fibrillation with RVR ([place]) 07/17/2015  . Hypotension 07/17/2015  . Renal insufficiency 07/17/2015  . Obesity 07/17/2015  . Metabolic encephalopathy 31/74/0992  . Fracture of radius, distal, with ulna, left, closed 06/17/2015  . S/P ORIF (open reduction internal fixation) fracture 06/17/2015  . Diaphragmatic hernia 04/07/2015  . Restrictive lung disease secondary to obesity 04/07/2015  . Pneumonia 04/07/2015  . COPD exacerbation ([place]) 04/07/2015  . Other emphysema ([place]) 04/07/2015  . Morbid obesity ([place]) 04/07/2015  . Debility 04/07/2015  . Sepsis ([place]) 04/06/2015  . Chronic a-fib ([place]) 03/20/2015  . Acute respiratory distress (HCC) 03/16/2015    Rosalyn Gess OTR/L,CLT 09/16/2015, 3:02 PM  [place] [facility] [address], [place], [address] Phone: [phone]   Fax:  [phone]  Name: Allison Shepard MRN: [id number] Date of Birth: 18-Nov-1933

## 2015-09-16 NOTE — Patient Instructions (Signed)
Focus on supination  Grip and prehension strength with green putty  2-3 x day

## 2015-09-17 ENCOUNTER — Telehealth: Payer: Self-pay

## 2015-09-17 NOTE — Telephone Encounter (Signed)
Informed pt's husband to have her here at 3pm Friday 09/18/15 to be seen by DS. Nothing further needed.

## 2015-09-17 NOTE — Telephone Encounter (Signed)
Pt called, states she needs an appointment ASAP with Dr. Sung Amabile for her "terrible cough". States she saw her PCP today, and advised her to be seen . Please call and advise of appointment.

## 2015-09-18 ENCOUNTER — Encounter: Payer: Self-pay | Admitting: Pulmonary Disease

## 2015-09-18 ENCOUNTER — Ambulatory Visit (INDEPENDENT_AMBULATORY_CARE_PROVIDER_SITE_OTHER): Payer: Medicare Other | Admitting: Pulmonary Disease

## 2015-09-18 VITALS — BP 132/70 | HR 86 | Ht 65.0 in | Wt 241.0 lb

## 2015-09-18 DIAGNOSIS — R05 Cough: Secondary | ICD-10-CM | POA: Diagnosis not present

## 2015-09-18 DIAGNOSIS — R053 Chronic cough: Secondary | ICD-10-CM

## 2015-09-18 DIAGNOSIS — J209 Acute bronchitis, unspecified: Secondary | ICD-10-CM

## 2015-09-18 MED ORDER — CANDESARTAN CILEXETIL 8 MG PO TABS: 8.0000 mg | ORAL_TABLET | Freq: Every day | ORAL | Status: DC

## 2015-09-18 MED ORDER — PREDNISONE 20 MG PO TABS: 40.0000 mg | ORAL_TABLET | Freq: Every day | ORAL | Status: DC

## 2015-09-18 MED ORDER — HYDROCOD POLST-CPM POLST ER 10-8 MG/5ML PO SUER: 5.0000 mL | Freq: Two times a day (BID) | ORAL | Status: DC | PRN

## 2015-09-18 NOTE — Patient Instructions (Signed)
Stop lisinopril Candesartan as alternative to lisinopril Prednisone 40 mg (2 pills) daily for 5 days Tussionex at night for cough

## 2015-09-21 ENCOUNTER — Telehealth: Payer: Self-pay | Admitting: *Deleted

## 2015-09-21 ENCOUNTER — Encounter: Payer: Self-pay | Admitting: *Deleted

## 2015-09-21 NOTE — Telephone Encounter (Signed)
Patient's spouse called and insurance doesn't cover candesartan (ATACAND) 8 MG tablet. What other medication can he take? Please call patient.

## 2015-09-21 NOTE — Telephone Encounter (Signed)
Please advise since Atacand is not covered by pt's insurance. Thanks.

## 2015-09-22 NOTE — Telephone Encounter (Signed)
LM on VM for pt's husband to call me back in regards to medication.

## 2015-09-22 NOTE — Telephone Encounter (Signed)
Pt's husband is going to call insurance and see what they will cover in place of Atacand. Will await call back.

## 2015-09-22 NOTE — Telephone Encounter (Signed)
Pt's husband has called back stating the insurance states they will cover Benicar in place of the Atacand. Please advise.

## 2015-09-23 ENCOUNTER — Emergency Department
Admission: EM | Admit: 2015-09-23 | Discharge: 2015-09-23 | Disposition: A | Discharge status: Home / Self Care | Payer: Medicare Other | Attending: Emergency Medicine | Admitting: Emergency Medicine

## 2015-09-23 ENCOUNTER — Encounter: Payer: Self-pay | Admitting: Emergency Medicine

## 2015-09-23 DIAGNOSIS — I129 Hypertensive chronic kidney disease with stage 1 through stage 4 chronic kidney disease, or unspecified chronic kidney disease: Secondary | ICD-10-CM | POA: Diagnosis not present

## 2015-09-23 DIAGNOSIS — R531 Weakness: Secondary | ICD-10-CM

## 2015-09-23 DIAGNOSIS — Z87891 Personal history of nicotine dependence: Secondary | ICD-10-CM | POA: Diagnosis not present

## 2015-09-23 DIAGNOSIS — E119 Type 2 diabetes mellitus without complications: Secondary | ICD-10-CM | POA: Insufficient documentation

## 2015-09-23 DIAGNOSIS — I499 Cardiac arrhythmia, unspecified: Secondary | ICD-10-CM | POA: Diagnosis not present

## 2015-09-23 DIAGNOSIS — L539 Erythematous condition, unspecified: Secondary | ICD-10-CM | POA: Diagnosis not present

## 2015-09-23 DIAGNOSIS — N189 Chronic kidney disease, unspecified: Secondary | ICD-10-CM | POA: Insufficient documentation

## 2015-09-23 DIAGNOSIS — E860 Dehydration: Secondary | ICD-10-CM | POA: Insufficient documentation

## 2015-09-23 LAB — CBC
HCT: 34.9 % — ABNORMAL LOW (ref 35.0–47.0)
Hemoglobin: 11.6 g/dL — ABNORMAL LOW (ref 12.0–16.0)
MCH: 32.4 pg (ref 26.0–34.0)
MCHC: 33.4 g/dL (ref 32.0–36.0)
MCV: 97.2 fL (ref 80.0–100.0)
PLATELETS: 245 10*3/uL (ref 150–440)
RBC: 3.59 MIL/uL — ABNORMAL LOW (ref 3.80–5.20)
RDW: 17.4 % — AB (ref 11.5–14.5)
WBC: 8.5 10*3/uL (ref 3.6–11.0)

## 2015-09-23 LAB — BASIC METABOLIC PANEL
Anion gap: 10 (ref 5–15)
BUN: 50 mg/dL — ABNORMAL HIGH (ref 6–20)
CHLORIDE: 103 mmol/L (ref 101–111)
CO2: 24 mmol/L (ref 22–32)
CREATININE: 1.67 mg/dL — AB (ref 0.44–1.00)
Calcium: 8.6 mg/dL — ABNORMAL LOW (ref 8.9–10.3)
GFR calc non Af Amer: 28 mL/min — ABNORMAL LOW (ref >60)
GFR, EST AFRICAN AMERICAN: 33 mL/min — AB (ref >60)
Glucose, Bld: 150 mg/dL — ABNORMAL HIGH (ref 65–99)
POTASSIUM: 4.4 mmol/L (ref 3.5–5.1)
SODIUM: 137 mmol/L (ref 135–145)

## 2015-09-23 LAB — TROPONIN I: Troponin I: 0.03 ng/mL (ref <0.031)

## 2015-09-23 MED ORDER — SODIUM CHLORIDE 0.9 % IV SOLN
Freq: Once | INTRAVENOUS | Status: AC
  Administered 2015-09-23: 12:00:00 via INTRAVENOUS

## 2015-09-23 MED ORDER — OLMESARTAN MEDOXOMIL 20 MG PO TABS: 20.0000 mg | ORAL_TABLET | Freq: Every day | ORAL | Status: DC

## 2015-09-23 NOTE — Discharge Instructions (Signed)
Dehydration, Adult °Dehydration is a condition in which you do not have enough fluid or water in your body. It happens when you take in less fluid than you lose. Vital organs such as the kidneys, brain, and heart cannot function without a proper amount of fluids. Any loss of fluids from the body can cause dehydration.  °Dehydration can range from mild to severe. This condition should be treated right away to help prevent it from becoming severe. °CAUSES  °This condition may be caused by: °· Vomiting. °· Diarrhea. °· Excessive sweating, such as when exercising in hot or humid weather. °· Not drinking enough fluid during strenuous exercise or during an illness. °· Excessive urine output. °· Fever. °· Certain medicines. °RISK FACTORS °This condition is more likely to develop in: °· People who are taking certain medicines that cause the body to lose excess fluid (diuretics).   °· People who have a chronic illness, such as diabetes, that may increase urination. °· Older adults.   °· People who live at high altitudes.   °· People who participate in endurance sports.   °SYMPTOMS  °Mild Dehydration °· Thirst. °· Dry lips. °· Slightly dry mouth. °· Dry, warm skin. °Moderate Dehydration °· Very dry mouth.   °· Muscle cramps.   °· Dark urine and decreased urine production.   °· Decreased tear production.   °· Headache.   °· Light-headedness, especially when you stand up from a sitting position.   °Severe Dehydration °· Changes in skin.   °· Cold and clammy skin.   °· Skin does not spring back quickly when lightly pinched and released.   °· Changes in body fluids.   °· Extreme thirst.   °· No tears.   °· Not able to sweat when body temperature is high, such as in hot weather.   °· Minimal urine production.   °· Changes in vital signs.   °· Rapid, weak pulse (more than 100 beats per minute when you are sitting still).   °· Rapid breathing.   °· Low blood pressure.   °· Other changes.   °· Sunken eyes.   °· Cold hands and feet.    °· Confusion. °· Lethargy and difficulty being awakened. °· Fainting (syncope).   °· Short-term weight loss.   °· Unconsciousness. °DIAGNOSIS  °This condition may be diagnosed based on your symptoms. You may also have tests to determine how severe your dehydration is. These tests may include:  °· Urine tests.   °· Blood tests.   °TREATMENT  °Treatment for this condition depends on the severity. Mild or moderate dehydration can often be treated at home. Treatment should be started right away. Do not wait until dehydration becomes severe. Severe dehydration needs to be treated at the hospital. °Treatment for Mild Dehydration °· Drinking plenty of water to replace the fluid you have lost.   °· Replacing minerals in your blood (electrolytes) that you may have lost.   °Treatment for Moderate Dehydration  °· Consuming oral rehydration solution (ORS). °Treatment for Severe Dehydration °· Receiving fluid through an IV tube.   °· Receiving electrolyte solution through a feeding tube that is passed through your nose and into your stomach (nasogastric tube or NG tube). °· Correcting any abnormalities in electrolytes. °HOME CARE INSTRUCTIONS  °· Drink enough fluid to keep your urine clear or pale yellow.   °· Drink water or fluid slowly by taking small sips. You can also try sucking on ice cubes.  °· Have food or beverages that contain electrolytes. Examples include bananas and sports drinks. °· Take over-the-counter and prescription medicines only as told by your health care provider.   °· Prepare ORS according to the manufacturer's instructions. Take sips   of ORS every 5 minutes until your urine returns to normal. °· If you have vomiting or diarrhea, continue to try to drink water, ORS, or both.   °· If you have diarrhea, avoid:   °¨ Beverages that contain caffeine.   °¨ Fruit juice.   °¨ Milk.    °¨ Carbonated soft drinks. °· Do not take salt tablets. This can lead to the condition of having too much sodium in your body  (hypernatremia).   °SEEK MEDICAL CARE IF: °· You cannot eat or drink without vomiting. °· You have had moderate diarrhea during a period of more than 24 hours. °· You have a fever. °SEEK IMMEDIATE MEDICAL CARE IF:  °· You have extreme thirst. °· You have severe diarrhea. °· You have not urinated in 6-8 hours, or you have urinated only a small amount of very dark urine. °· You have shriveled skin. °· You are dizzy, confused, or both. °  °This information is not intended to replace advice given to you by your health care provider. Make sure you discuss any questions you have with your health care provider. °  °Document Released: 08/01/2005 Document Revised: 04/22/2015 Document Reviewed: 12/17/2014 °Elsevier Interactive Patient Education ©2016 [employer] ° °Weakness °Weakness is a lack of strength. It may be felt all over the body (generalized) or in one specific part of the body (focal). Some causes of weakness can be serious. You may need further medical evaluation, especially if you are elderly or you have a history of immunosuppression (such as chemotherapy or HIV), kidney disease, heart disease, or diabetes. °CAUSES  °Weakness can be caused by many different things, including: °· Infection. °· Physical exhaustion. °· Internal bleeding or other blood loss that results in a lack of red blood cells (anemia). °· Dehydration. This cause is more common in elderly people. °· Side effects or electrolyte abnormalities from medicines, such as pain medicines or sedatives. °· Emotional distress, anxiety, or depression. °· Circulation problems, especially severe peripheral arterial disease. °· Heart disease, such as rapid atrial fibrillation, bradycardia, or heart failure. °· Nervous system disorders, such as Guillain-Barré syndrome, multiple sclerosis, or stroke. °DIAGNOSIS  °To find the cause of your weakness, your caregiver will take your history and perform a physical exam. Lab tests or X-rays may also be ordered, if  needed. °TREATMENT  °Treatment of weakness depends on the cause of your symptoms and can vary greatly. °HOME CARE INSTRUCTIONS  °· Rest as needed. °· Eat a well-balanced diet. °· Try to get some exercise every day. °· Only take over-the-counter or prescription medicines as directed by your caregiver. °SEEK MEDICAL CARE IF:  °· Your weakness seems to be getting worse or spreads to other parts of your body. °· You develop new aches or pains. °SEEK IMMEDIATE MEDICAL CARE IF:  °· You cannot perform your normal daily activities, such as getting dressed and feeding yourself. °· You cannot walk up and down stairs, or you feel exhausted when you do so. °· You have shortness of breath or chest pain. °· You have difficulty moving parts of your body. °· You have weakness in only one area of the body or on only one side of the body. °· You have a fever. °· You have trouble speaking or swallowing. °· You cannot control your bladder or bowel movements. °· You have black or bloody vomit or stools. °MAKE SURE YOU: °· Understand these instructions. °· Will watch your condition. °· Will get help right away if you are not doing well or get worse. °  °  This information is not intended to replace advice given to you by your health care provider. Make sure you discuss any questions you have with your health care provider. °  °Document Released: 08/01/2005 Document Revised: 01/31/2012 Document Reviewed: 09/30/2011 °Elsevier Interactive Patient Education ©2016 [employer] Inc[employer] ° °

## 2015-09-23 NOTE — Telephone Encounter (Signed)
Per DS send Benicar  daily to pt's pharmacy. LMOAM informing of the medication change. Nothing further needed.

## 2015-09-23 NOTE — ED Notes (Signed)
Pt states that she is going to go home without oxygen tank. Pt does not have portable oxygen tank as it has not arrived from being ordered per patient report. PT states that she is going straight home and does not need oxygen tank. RN offered to help patient get home with oxygen tank, pt refuses. Pt alert and oriented X4, active, cooperative, pt in NAD. RR even and unlabored, color WNL.

## 2015-09-23 NOTE — ED Notes (Signed)
Pt alert and oriented X4, active, cooperative, pt in NAD. RR even and unlabored, color WNL.  Pt informed to return if any life threatening symptoms occur.   

## 2015-09-23 NOTE — ED Notes (Signed)
MD at bedside. 

## 2015-09-23 NOTE — ED Provider Notes (Signed)
[facility] Emergency Department Provider Note     Time seen: ----------------------------------------- 11:26 AM on 09/23/2015 -----------------------------------------    I have reviewed the triage vital signs and the nursing notes.   HISTORY  Chief Complaint Weakness    HPI Allison Shepard is a 82 y.o. female who presents to ER for weakness today.Patient states she's been weak since last night, just started on prednisone yesterday for bronchitis. There've also been changes in her diabetes medication but she is not able to tell me what those are. She denies fevers, chills, chest pain, shortness of breath, vomiting or diarrhea. Patient states she does not sleep well at night and was startled and her sleep last night. Patient does not complain of weakness in the bed only with ambulation.   Past Medical History  Diagnosis Date  . COPD (chronic obstructive pulmonary disease) (HCC)   . Atrial fibrillation (HCC)   . Hypertension   . Hypercholesteremia   . Pneumonia   . Diabetes mellitus without complication (HCC)     type 2  . Headache     migraines - years ago  . Shortness of breath dyspnea   . Asthma   . Dysrhythmia     afib  . Chronic kidney disease     history of insufficiency  . Arthritis   . Spinal disorder     stenosis  . Complication of anesthesia     difficulty to wake up after an 8 hour back surgery  . Edema     legs/feet  . Wheezing     Patient Active Problem List   Diagnosis Date Noted  . Acute respiratory failure with hypoxia (HCC) 07/17/2015  . Left upper lobe pneumonia 07/17/2015  . Hemoptysis 07/17/2015  . Atrial fibrillation with RVR (HCC) 07/17/2015  . Hypotension 07/17/2015  . Renal insufficiency 07/17/2015  . Obesity 07/17/2015  . Metabolic encephalopathy 07/17/2015  . Fracture of radius, distal, with ulna, left, closed 06/17/2015  . S/P ORIF (open reduction internal fixation) fracture 06/17/2015  .  Diaphragmatic hernia 04/07/2015  . Restrictive lung disease secondary to obesity 04/07/2015  . Pneumonia 04/07/2015  . COPD exacerbation (HCC) 04/07/2015  . Other emphysema (HCC) 04/07/2015  . Morbid obesity (HCC) 04/07/2015  . Debility 04/07/2015  . Sepsis (HCC) 04/06/2015  . Chronic a-fib (HCC) 03/20/2015  . Acute respiratory distress (HCC) 03/16/2015    Past Surgical History  Procedure Laterality Date  . Abdominal hysterectomy    . Shoulder fusion surgery Right   . Knee surgery Right     knee replacement  . Hip surgery Right     hip replacement  . Back surgery    . Eye surgery      cataract surgery ? eye  . Fracture surgery      right arm 20 years ago  . Colonoscopy      x 3  . Orif distal radius fracture Left 06/17/2015  . Open reduction internal fixation (orif) distal radial fracture Left 06/17/2015    Procedure: OPEN REDUCTION INTERNAL FIXATION LEFT  DISTAL RADIUS AND ULNA FRACTURES;  Surgeon: Tarry Kos, MD;  Location: [facility] OR;  Service: Orthopedics;  Laterality: Left;  . Joint replacement    . Hernia repair    . Cataract extraction w/phaco Left 07/28/2015    Procedure: CATARACT EXTRACTION PHACO AND INTRAOCULAR LENS PLACEMENT (IOC);  Surgeon: Galen Manila, MD;  Location: [facility] ORS;  Service: Ophthalmology;  Laterality: Left;  [place] 2:16 AP% 27.4 CDE 37.45 fluid pack  lot # [id number]    Allergies Hydromorphone hcl; Ibuprofen; Mucinex; and Percocet  Social History Social History  Substance Use Topics  . Smoking status: Former Smoker    Quit date: 06/15/1992  . Smokeless tobacco: Never Used  . Alcohol Use: None     Comment: daily    Review of Systems Constitutional: Negative for fever. Eyes: Negative for visual changes. ENT: Negative for sore throat. Cardiovascular: Negative for chest pain. Respiratory: Negative for shortness of breath. Gastrointestinal: Negative for abdominal pain, vomiting and diarrhea. Genitourinary: Negative for  dysuria. Musculoskeletal: Negative for back pain. Skin: Negative for rash. Neurological: Negative for headaches, positive for weakness  10-point ROS otherwise negative.  ____________________________________________   PHYSICAL EXAM:  VITAL SIGNS: ED Triage Vitals  Enc Vitals Group     BP 09/23/15 1046 110/56 mmHg     Pulse Rate 09/23/15 1046 69     Resp 09/23/15 1046 20     Temp 09/23/15 1046 97.2 F (36.2 C)     Temp Source 09/23/15 1046 Oral     SpO2 09/23/15 1046 98 %     Weight 09/23/15 1046 235 lb (106.595 kg)     Height 09/23/15 1046  (1.651 m)     Head Cir --      Peak Flow --      Pain Score 09/23/15 1047 0     Pain Loc --      Pain Edu? --      Excl. in GC? --     Constitutional: Alert and oriented. Chronically ill-appearing, no acute distress, obese Eyes: Conjunctivae are normal. PERRL. Normal extraocular movements. ENT   Head: Normocephalic and atraumatic.   Nose: No congestion/rhinnorhea.   Mouth/Throat: Mucous membranes are moist.   Neck: No stridor. Cardiovascular: Irregularly irregular rhythm. Normal and symmetric distal pulses are present in all extremities. No murmurs, rubs, or gallops. Respiratory: Normal respiratory effort without tachypnea nor retractions. Breath sounds are clear and equal bilaterally. No wheezes/rales/rhonchi. Gastrointestinal: Soft and nontender. No distention. No abdominal bruits.  Musculoskeletal: Nontender with normal range of motion in all extremities. No joint effusions.  No lower extremity tenderness, lower extremity edema Neurologic:  Normal speech and language. No gross focal neurologic deficits are appreciated. Speech is normal. No gait instability. Skin:  Skin is warm, dry and intact. Mild erythema on the lower extremities bilaterally. Unna boot wrap on the left leg Psychiatric: Mood and affect are normal.  ____________________________________________  EKG: Interpreted by me. A. fib with a rate of 86 bpm,  normal QRS, long QT interval. Nonspecific ST and T-wave changes. Normal axis.  ____________________________________________  ED COURSE:  Pertinent labs & imaging results that were available during my care of the patient were reviewed by me and considered in my medical decision making (see chart for details). Nonspecific weakness, will check basic labs and reevaluate. ____________________________________________    LABS (pertinent positives/negatives)  Labs Reviewed  BASIC METABOLIC PANEL - Abnormal; Notable for the following:    Glucose, Bld 150 (*)    BUN 50 (*)    Creatinine, Ser 1.67 (*)    Calcium 8.6 (*)    GFR calc non Af Amer 28 (*)    GFR calc Af Amer 33 (*)    All other components within normal limits  CBC - Abnormal; Notable for the following:    RBC 3.59 (*)    Hemoglobin 11.6 (*)    HCT 34.9 (*)    RDW 17.4 (*)    All other  components within normal limits  TROPONIN I  URINALYSIS COMPLETEWITH MICROSCOPIC ([facility] ONLY)  ___________________________________________  FINAL ASSESSMENT AND PLAN  Weakness, dehydration  Plan: Patient with labs and imaging as dictated above. Patient is in no acute distress, has received a liter of saline here. Some of her symptoms are probably dehydration related, and some from prednisone that she is now taking. She is stable for outpatient follow-up with her doctor   Emily Filbert, MD   Emily Filbert, MD 09/23/15 [phone]

## 2015-09-23 NOTE — Progress Notes (Signed)
ACUTE PULMONARY OFFICE VISIT  ACUTE PROBLEM: Severe acute exacerbation of chronic cough Increased dyspnea  CHRONIC PULMONARY PROBLEMS: COPD Chronic hypoxic respiratory failure Chronic cough Recent pneumococcal PNA  SUBJ: Seen as acute visit for one week of severe cough productive of yellow-green sputum and increased dyspnea. She denies fever, CP, hemoptysis and worsening LE edema. She reminds me that she has chronic cough that is usually hacking in nature and minimally productive. She saw her primary MD, Dr Harrington Challenger, and was prescribed Augmentin for AECOPD. She was not prescribed steroids.  OBJ: Filed Vitals:   09/18/15 1521  BP: 132/70  Pulse: 86  Height:  (1.651 m)  Weight: 109.317 kg (241 lb)  SpO2: 91%   Chronically ill appearing, no overt distress HEENT WNL JVP not visualized  diffuse scattered rhonchi and wheezes RRR s M Obese, soft, NT, +BS Chronic LE stasis changes with edema  LEs in ACE wraps    IMPRESSION: Chronic hypoxic respiratory failure COPD Acute COPD exacerbation with wheezing and purulent sputum Chronic cough - note chronic ACEI therapy   PLAN:   Complete Augmentin as prescribed Prednisone 40 mg daily X 5 days Change ACEI to ARB (initially prescribed [place] but this was not covered) - Benicar 20 mg daily ROV has already been scheduled She has requested that I send a copy of this note to Dr Lady Gary who originally prescribed the ACEI. I will do so ROV 4 weeks  I have reviewed this patient's medical problems, current medications and therapies and prior pulmonary office notes in evaluation and formulation of the above assessment and plan    Merwyn Katos, MD [facility] [place] Pulmonary/CCM

## 2015-09-23 NOTE — ED Notes (Addendum)
Pt to ed with c/o weakness today and unable to walk.  Pt with sats 88-92% on room air.  States she is supposed to be on o2 all the time but does not have a portable tank.  Pt placed on o2 at triage via [place] at 3lpm. Sats up to 99%

## 2015-09-23 NOTE — ED Notes (Signed)
Pt reports generalized weakness since last night. Pt presents to ER without home oxygen due to no portable tank accessible at this time. Pt placed on 3L [place] which she reports to wear at home. Pt alert and oriented X4, active, cooperative, pt in NAD. RR even and unlabored, color WNL.

## 2015-09-24 ENCOUNTER — Telehealth: Payer: Self-pay | Admitting: *Deleted

## 2015-09-24 NOTE — Telephone Encounter (Signed)
Discontinue Tussionex Resume prednisone as previously ordered  Theodoro Grist

## 2015-09-24 NOTE — Telephone Encounter (Signed)
Husband called stating that patient got disoriented and weak so he stopped the tussionex and prednisone. States he took her to the ED yesterday and said she was dehydrated. States they gave her fluids and d/c'd her home. Informed him that the cough syrup more than likely caused the disorientation but he wants to know if you think he should resume the prednisone and tussionex. Please advise.

## 2015-09-24 NOTE — Telephone Encounter (Signed)
Pt and husband informed. Nothing further needed.

## 2015-09-30 ENCOUNTER — Telehealth: Payer: Self-pay | Admitting: *Deleted

## 2015-09-30 ENCOUNTER — Telehealth: Payer: Self-pay

## 2015-09-30 NOTE — Telephone Encounter (Signed)
Husband called back asking if he had turned her O2 up higher than ordered would it cause pt to be confused. We discussed the issue with turning up the O2 and that she may have issues going on that we can't see physically and then he states that pt is hallucinating at this time. Informed husband that he needs to take pt to the ER to have her evaluate. Pt's husband verbalized understanding. Nothing further needed.

## 2015-09-30 NOTE — Telephone Encounter (Signed)
Spoke with husband and he states pt is having extreme SOB and weakness. Spoke with DR and offered pt to come in this am at 10am. Husband states pt has an appt with the endocrinologist at 9:30am and that they can't come in. Informed pt's husband that if he can't bring her in here then we suggest going to ER verses waiting to be seen tomorrow due to the extreme SOB. Husband states they will discuss it and he may call back for the appt. Nothing further needed.

## 2015-09-30 NOTE — Telephone Encounter (Signed)
Pt husband called and states pt is having problems breathing since last night.

## 2015-10-05 NOTE — Telephone Encounter (Signed)
This encounter was created in error - please disregard.

## 2015-10-09 ENCOUNTER — Telehealth: Payer: Self-pay | Admitting: Pulmonary Disease

## 2015-10-09 ENCOUNTER — Ambulatory Visit (INDEPENDENT_AMBULATORY_CARE_PROVIDER_SITE_OTHER): Payer: Medicare Other | Admitting: Pulmonary Disease

## 2015-10-09 ENCOUNTER — Encounter: Payer: Self-pay | Admitting: Pulmonary Disease

## 2015-10-09 VITALS — BP 126/74 | HR 99 | Wt 250.0 lb

## 2015-10-09 DIAGNOSIS — R0600 Dyspnea, unspecified: Secondary | ICD-10-CM

## 2015-10-09 DIAGNOSIS — I509 Heart failure, unspecified: Secondary | ICD-10-CM

## 2015-10-09 DIAGNOSIS — R0902 Hypoxemia: Secondary | ICD-10-CM | POA: Diagnosis not present

## 2015-10-09 DIAGNOSIS — I50810 Right heart failure, unspecified: Secondary | ICD-10-CM

## 2015-10-09 DIAGNOSIS — J449 Chronic obstructive pulmonary disease, unspecified: Secondary | ICD-10-CM | POA: Diagnosis not present

## 2015-10-09 DIAGNOSIS — R531 Weakness: Secondary | ICD-10-CM | POA: Diagnosis not present

## 2015-10-09 DIAGNOSIS — K219 Gastro-esophageal reflux disease without esophagitis: Secondary | ICD-10-CM

## 2015-10-09 NOTE — Telephone Encounter (Signed)
Note was faxed to office and given to DS. Nothing further needed.

## 2015-10-09 NOTE — Telephone Encounter (Signed)
Patient pcp office called to let us know there is a note from this mornings visit by Dr. Harrington Challenger that would be very helpful for Dr. Sung Amabile to see.  It can be seen in [facility])

## 2015-10-11 NOTE — Progress Notes (Signed)
ACUTE PULMONARY OFFICE VISIT  SUBJ: Multiple complaints including persistent severe DOE, weakness, wt gain and increased edema, recurrent emesis after after meals, dyspepsia while supine. She has minimal cough, no fever, no hemoptysis. She saw Dr Harrington Challenger earlier in day and received rx for furosemide and omeprazole. She has also seen Dr Lady Gary recently who felt that she should be on prednisone  Filed Vitals:   10/09/15 1306  BP: 126/74  Pulse: 99  Weight: 250 lb (113.399 kg)  SpO2: 94%   Chronically ill appearing in WC with [place] O2 @ 2 lpm HEENT WNL JVP cannot be assessed No wheezes, BS diminished Reg, no M Obese, soft, NT 2-3+ symmetric LE edema with stasis changes No focal neurological deficits  IMPRESSION: Dyspnea  Weakness  Chronic obstructive pulmonary disease, unspecified COPD type (HCC)  Hypoxemia  Right ventricular failure (HCC)  Gastroesophageal reflux disease, esophagitis presence not specified  I am most concerned about the weight gain, fluid retention.   PLAN: Agree with Lasix and omeprazole as ordered by Dr Harrington Challenger Will hold off on prednisone for now as there is no wheezing and it would likely exacerbate edema She already has ROV scheduled with me on 03/02  [name], MD PCCM service Mobile [phone] Pager [phone]

## 2015-10-15 ENCOUNTER — Encounter: Payer: Self-pay | Admitting: Pulmonary Disease

## 2015-10-15 ENCOUNTER — Ambulatory Visit (INDEPENDENT_AMBULATORY_CARE_PROVIDER_SITE_OTHER): Payer: Medicare Other | Admitting: Pulmonary Disease

## 2015-10-15 VITALS — BP 132/72 | HR 99 | Ht 65.0 in | Wt 246.2 lb

## 2015-10-15 DIAGNOSIS — R601 Generalized edema: Secondary | ICD-10-CM

## 2015-10-15 DIAGNOSIS — R0600 Dyspnea, unspecified: Secondary | ICD-10-CM | POA: Diagnosis not present

## 2015-10-15 DIAGNOSIS — J449 Chronic obstructive pulmonary disease, unspecified: Secondary | ICD-10-CM | POA: Diagnosis not present

## 2015-10-15 NOTE — Progress Notes (Signed)
PROBLEMS: COPD Pulmonary hypertension Chronic hypoxemia GERD Severe deconditioning  INTERVAL HISTORY: No major events  SUBJ: Much improved after initiation of  Omeprazole and furosemide. Much better spirits. No new complaints  OBJ: Filed Vitals:   10/15/15 0946 10/15/15 1007  BP: 132/72   Pulse: 99   Height:  (1.651 m)   Weight: 246 lb 3.2 oz (111.676 kg)   SpO2: 90% 90%  2 lpm [place]  Chronically ill appearing, no respiratory distress HEENT WNL JVP cannot be assessed NO wheezes RRR s M Obese, soft, NT BLE wrapped in UNA boots NO focal neurological deficits  DATA: No new data  IMPRESSION: COPD Secondary pulm htn with cor pulmonale Chronic hypoxemia GERD Edema  Overall much improved after starting PPI and diuretic  PLAN: No changes made to her medical regimen She requires frequent F/U due to her tenuous status ROV in 6 wks   Merwyn Katos, MD [facility] [place] Pulmonary/CCM

## 2015-12-04 ENCOUNTER — Ambulatory Visit (INDEPENDENT_AMBULATORY_CARE_PROVIDER_SITE_OTHER): Payer: Medicare Other | Admitting: Pulmonary Disease

## 2015-12-04 ENCOUNTER — Encounter: Payer: Self-pay | Admitting: Gastroenterology

## 2015-12-04 ENCOUNTER — Encounter: Payer: Self-pay | Admitting: Pulmonary Disease

## 2015-12-04 VITALS — BP 110/80 | HR 89 | Wt 243.0 lb

## 2015-12-04 DIAGNOSIS — K219 Gastro-esophageal reflux disease without esophagitis: Secondary | ICD-10-CM | POA: Diagnosis not present

## 2015-12-04 DIAGNOSIS — J449 Chronic obstructive pulmonary disease, unspecified: Secondary | ICD-10-CM | POA: Diagnosis not present

## 2015-12-04 DIAGNOSIS — I4891 Unspecified atrial fibrillation: Secondary | ICD-10-CM

## 2015-12-04 DIAGNOSIS — R112 Nausea with vomiting, unspecified: Secondary | ICD-10-CM

## 2015-12-04 DIAGNOSIS — R630 Anorexia: Secondary | ICD-10-CM

## 2015-12-07 NOTE — Progress Notes (Signed)
PROBLEMS: COPD Recent pneumococcal PNA Pulmonary hypertension Chronic hypoxemia GERD Chronic AF Severe deconditioning    SUBJ: She is very frustrated with her extreme exertional limitation. Continues to c/o class III dyspnea. Still has frequent heartburn symptoms and N/V after eating anything. Reports cough productive of clear mucus after every meal. She has had an episode of rectal bleeding recently and Xarelto has been stopped with resolution of bleeding. She had a referral to GI medicine but she cancelled it. She remains on omeprazole. She also reports that she is "dropping things" frequently.   OBJ: Filed Vitals:   12/04/15 1051  BP: 110/80  Pulse: 89  Weight: 243 lb (110.224 kg)  SpO2: 95%  2 lpm [place]  Chronically ill appearing, no respiratory distress HEENT WNL JVP cannot be assessed No wheezes Tachy, IRIR, no M noted Obese, soft, NT 1+ BLE edema with chronic stasis changes No focal neurological deficits  DATA: No new data  IMPRESSION: Chronic obstructive pulmonary disease, unspecified COPD type (HCC)  Gastroesophageal reflux disease, esophagitis presence not specified - Plan: Ambulatory referral to Gastroenterology  Intractable vomiting with nausea, unspecified vomiting type - Plan: Ambulatory referral to Gastroenterology  Anorexia - Plan: Ambulatory referral to Gastroenterology  Atrial fibrillation, unspecified type [facility](HCC)  She has a multitude of problems and complaints. Although reports severe DOE, I think her limitation is multifactorial and probably not entirely attributable to COPD. I am concerned that she is in AF and not anticoagulated. I am concerned about her recent rectal bleeding that has not been thoroughly evaluated. Lastly, her GERD, nausea and vomiting seems to be severe and disabling. There is not much that can be added from the standpoint of her COPD regimen. I think these other issues need to be investigated more completely.  PLAN: No changes  made to her COD regimen I have made referral to GI medicine to evaluate intractable GERD and N/V If possible, it would be desirable for her to be back on full anticoagulation. She probably needs endoscopy prior to this being resumed. With her multiple medical problems and generally declining health status that we seem unable to reverse, it would seem reasonable to begin EOL planning and goals of care discussions  I will leave this to her primary care MD She requires frequent F/U due to her tenuous status ROV in 6 wks   Billy Fischeravid Simonds, MD PCCM service Mobile [phone] Pager [phone] 12/07/2015

## 2015-12-08 ENCOUNTER — Ambulatory Visit (INDEPENDENT_AMBULATORY_CARE_PROVIDER_SITE_OTHER): Payer: Medicare Other | Admitting: Gastroenterology

## 2015-12-08 ENCOUNTER — Other Ambulatory Visit (INDEPENDENT_AMBULATORY_CARE_PROVIDER_SITE_OTHER): Payer: Medicare Other

## 2015-12-08 ENCOUNTER — Encounter: Payer: Self-pay | Admitting: Gastroenterology

## 2015-12-08 VITALS — BP 94/64 | HR 68 | Ht 64.0 in | Wt 244.0 lb

## 2015-12-08 DIAGNOSIS — R109 Unspecified abdominal pain: Secondary | ICD-10-CM

## 2015-12-08 DIAGNOSIS — R112 Nausea with vomiting, unspecified: Secondary | ICD-10-CM

## 2015-12-08 LAB — COMPREHENSIVE METABOLIC PANEL
ALK PHOS: 113 U/L (ref 39–117)
ALT: 23 U/L (ref 0–35)
AST: 19 U/L (ref 0–37)
Albumin: 3.6 g/dL (ref 3.5–5.2)
BUN: 31 mg/dL — ABNORMAL HIGH (ref 6–23)
CO2: 29 meq/L (ref 19–32)
Calcium: 9 mg/dL (ref 8.4–10.5)
Chloride: 103 mEq/L (ref 96–112)
Creatinine, Ser: 1.47 mg/dL — ABNORMAL HIGH (ref 0.40–1.20)
GFR: 36.4 mL/min — AB (ref >60.00)
GLUCOSE: 170 mg/dL — AB (ref 70–99)
POTASSIUM: 4.8 meq/L (ref 3.5–5.1)
Sodium: 139 mEq/L (ref 135–145)
Total Bilirubin: 0.9 mg/dL (ref 0.2–1.2)
Total Protein: 6.5 g/dL (ref 6.0–8.3)

## 2015-12-08 LAB — CBC WITH DIFFERENTIAL/PLATELET
BASOS ABS: 0 10*3/uL (ref 0.0–0.1)
Basophils Relative: 0.5 % (ref 0.0–3.0)
Eosinophils Absolute: 0.1 10*3/uL (ref 0.0–0.7)
Eosinophils Relative: 3 % (ref 0.0–5.0)
HCT: 28.2 % — ABNORMAL LOW (ref 36.0–46.0)
Hemoglobin: 8.8 g/dL — ABNORMAL LOW (ref 12.0–15.0)
LYMPHS ABS: 0.5 10*3/uL — AB (ref 0.7–4.0)
Lymphocytes Relative: 11.7 % — ABNORMAL LOW (ref 12.0–46.0)
MCHC: 31.3 g/dL (ref 30.0–36.0)
MCV: 81.7 fl (ref 78.0–100.0)
MONO ABS: 0.4 10*3/uL (ref 0.1–1.0)
Monocytes Relative: 9.2 % (ref 3.0–12.0)
NEUTROS PCT: 75.6 % (ref 43.0–77.0)
Neutro Abs: 3.1 10*3/uL (ref 1.4–7.7)
PLATELETS: 262 10*3/uL (ref 150.0–400.0)
RBC: 3.45 Mil/uL — AB (ref 3.87–5.11)
RDW: 20 % — ABNORMAL HIGH (ref 11.5–15.5)
WBC: 4.1 10*3/uL (ref 4.0–10.5)

## 2015-12-08 LAB — HEMOGLOBIN A1C: Hgb A1c MFr Bld: 6.1 % (ref 4.6–6.5)

## 2015-12-08 NOTE — Progress Notes (Signed)
HPI: This is a  very pleasant 82 year old woman    who was referred to me by Merwyn KatosSimonds, David B, MD  to evaluate  vomiting, rectal bleeding .    Chief complaint is rectal bleeding, coughing  Says her stomach is a problem.  About 2 hours after eating dinner she has a coughing fit and then this leads to excessive phlegm. There is no food but says there is a lot of phlegm. This goes on for hours.  This vomiting occurs she feels depends on what she eats.  Can occur 2-3 times per week.  She eats very little she says. This started 2-3 months ago.  Has not been losing weight.  Never throws up blood.  She is in afib.  Sees cardiologist in [place] I believe. She was put on xarelto and noted rectal bleeding.  This was about 5 months ago. She explained this to her PCP and he recommended she stop xarelto and the bleeding stopped.  She has had several colonoscopies:  She has a GI doctor already; She thinks she had acolonoscopy last year at [facility]. She does not think that that MD is aware that she has had bleeding several months.  She does not think she's ever had EGD.  She is in a wheelchair and on 24 hour oxygen, she is morbidly obese with BMI >40.  She thinks eating cheese may bring on her symptoms.  CBC from February 2017 shows slight anemia   Review of systems: Pertinent positive and negative review of systems were noted in the above HPI section. Complete review of systems was performed and was otherwise normal.   Past Medical History  Diagnosis Date  . COPD (chronic obstructive pulmonary disease) (HCC)   . Atrial fibrillation (HCC)   . Hypertension   . Hypercholesteremia   . Pneumonia   . Diabetes mellitus without complication (HCC)     type 2  . Headache     migraines - years ago  . Shortness of breath dyspnea   . Asthma   . Dysrhythmia     afib  . Chronic kidney disease     history of insufficiency  . Arthritis   . Spinal disorder     stenosis  . Complication of anesthesia      difficulty to wake up after an 8 hour back surgery  . Edema     legs/feet  . Wheezing     Past Surgical History  Procedure Laterality Date  . Abdominal hysterectomy    . Shoulder fusion surgery Right   . Total knee arthroplasty Right     knee replacement  . Total hip arthroplasty Right     hip replacement  . Back surgery    . Cataract extraction      cataract surgery ? eye  . Fracture surgery Right     right arm 20 years ago  . Colonoscopy      x 3  . Orif distal radius fracture Left 06/17/2015  . Open reduction internal fixation (orif) distal radial fracture Left 06/17/2015    Procedure: OPEN REDUCTION INTERNAL FIXATION LEFT  DISTAL RADIUS AND ULNA FRACTURES;  Surgeon: Tarry KosNaiping M Xu, MD;  Location: [facility] OR;  Service: Orthopedics;  Laterality: Left;  . Joint replacement    . Hernia repair    . Cataract extraction w/phaco Left 07/28/2015    Procedure: CATARACT EXTRACTION PHACO AND INTRAOCULAR LENS PLACEMENT (IOC);  Surgeon: Galen ManilaWilliam Porfilio, MD;  Location: [facility] ORS;  Service: Ophthalmology;  Laterality: Left;  US 2:16     Current Outpatient Prescriptions  Medication Sig Dispense Refill  . albuterol (PROVENTIL HFA;VENTOLIN HFA) 108 (90 BASE) MCG/ACT inhaler Inhale 2 puffs into the lungs every 6 (six) hours as needed for wheezing or shortness of breath.    Marland Kitchen. albuterol (PROVENTIL) (2.5 MG/3ML) 0.083% nebulizer solution Take 2.5 mg by nebulization every 6 (six) hours as needed for wheezing or shortness of breath.    Marland Kitchen. aspirin EC 81 MG tablet Take 81 mg by mouth daily.    . calcium carbonate (TUMS EX) 750 MG chewable tablet Chew 1 tablet by mouth daily.    . Cholecalciferol (VITAMIN D-3) 1000 UNITS CAPS Take 1,000 Units by mouth daily.    Marland Kitchen. diltiazem (CARDIZEM CD) 180 MG 24 hr capsule     . fluticasone (FLONASE) 50 MCG/ACT nasal spray Place 1 spray into both nostrils daily. 16 g 2  . furosemide (LASIX) 40 MG tablet Take 40 mg by mouth daily.    . hydrocortisone valerate cream  (WESTCORT) 0.2 % Apply 1 application topically daily.    . insulin aspart (NOVOLOG) 100 UNIT/ML injection Inject 8-12 Units into the skin 3 (three) times daily with meals. Below 200=8 units and every 50 units above she gets one extra unit up to 12 units    . insulin glargine (LANTUS) 100 UNIT/ML injection Inject 30 Units into the skin every morning.     . meloxicam (MOBIC) 7.5 MG tablet Take 7.5 mg by mouth daily.    . metoprolol succinate (TOPROL-XL) 100 MG 24 hr tablet Take 100 mg by mouth daily. Take with or immediately following a meal.    . nystatin (MYCOSTATIN) powder Apply 1 g topically 2 (two) times daily as needed.    Marland Kitchen. omeprazole (PRILOSEC) 20 MG capsule Take 20 mg by mouth daily.    . potassium chloride SA (K-DUR,KLOR-CON) 20 MEQ tablet Take 20 mEq by mouth 2 (two) times daily.    . simvastatin (ZOCOR) 10 MG tablet Take 10 mg by mouth daily.    Marland Kitchen. umeclidinium-vilanterol (ANORO ELLIPTA) 62.5-25 MCG/INH AEPB Inhale 1 puff into the lungs daily.    Marland Kitchen. zolpidem (AMBIEN) 5 MG tablet Take 5 mg by mouth at bedtime as needed for sleep. Reported on 09/08/2015     No current facility-administered medications for this visit.    Allergies as of 12/08/2015 - Review Complete 12/08/2015  Allergen Reaction Noted  . Hydromorphone hcl Nausea And Vomiting 03/16/2015  . Ibuprofen Other (See Comments) 03/11/2015  . Mucinex [guaifenesin er] Other (See Comments) 04/06/2015  . Percocet [oxycodone-acetaminophen] Hives 03/11/2015    Family History  Problem Relation Age of Onset  . Heart disease Father     Social History   Social History  . Marital Status: Married    Spouse Name: N/A  . Number of Children: 2  . Years of Education: N/A   Occupational History  . retired    Social History Main Topics  . Smoking status: Former Smoker    Quit date: 06/15/1992  . Smokeless tobacco: Never Used  . Alcohol Use: 8.4 oz/week    7 Glasses of wine, 7 Shots of liquor, 0 Standard drinks or equivalent per  week     Comment: daily  . Drug Use: No  . Sexual Activity: Not on file   Other Topics Concern  . Not on file   Social History Narrative     Physical Exam: BP 94/64 mmHg  Pulse 68  Ht 5'  4" (1.626 m)  Wt 244 lb (110.678 kg)  BMI 41.86 kg/m2 Constitutional:Chronically ill-appearing with morbid obesity, sitting in a wheelchair with nasal cannula oxygen on  Psychiatric: alert and oriented x3 Eyes: extraocular movements intact Mouth: oral pharynx moist, no lesions Neck: supple no lymphadenopathy Cardiovascular: heart regular rate and rhythm Lungs: clear to auscultation bilaterally Abdomen: soft, nontender, nondistended, no obvious ascites, no peritoneal signs, normal bowel sounds Extremities: no lower extremity edema bilaterally Skin: no lesions on visible extremities   Assessment and plan: 81 y.o. female with  morbid obesity, frailty in a wheelchair, oxygen requiring COPD; intermittent abdominal pains and "vomiting", also recent rectal bleeding  First she tells me she gets colonoscopies quite frequently in . She believes her most recent one was within the past year or 2. We will get records from that gastroenterologist here for review it she did seem to have rectal bleeding shortly after starting Xarelto, I'm not sure why but it does sound relatively low volume. The fact that she had a colonoscopy last year to seem to suggest that it is unlikely a sign of underlying colon cancer. She has fits of "vomiting" but to me these really seem like coughing fits. She actually describes severe coughing 2-3 times per week, usually about 2 hours after eating. After coughing for a very prolonged period time she describes extreme phlegm production and spitting out. She says no food never comes out. She said is not like vomiting. I recommended we proceed with upper endoscopy at her soonest convenience. This is absolute safest if it is done at the hospital given her comorbidities. She'll also  get a basic set of labs today including a CBC, complete metabolic profile, hemoglobin A1c.   Daniel Jacobs, MD [facility] 12/08/2015, 10:20 AM  Cc: Simonds, David B, MD   

## 2015-12-08 NOTE — Patient Instructions (Addendum)
We will get records sent from your previous gastroenterologist at [facility] for review.  This will include any endoscopic (colonoscopy or upper endoscopy) procedures and any associated pathology reports.   You will be set [facility] for an [facility] for abd pains, ?vomiting. You will have labs checked today in the basement lab.  Please head down after you check out with the front desk  (cbc, cmet, HbA1c level).

## 2015-12-17 ENCOUNTER — Encounter (HOSPITAL_COMMUNITY): Payer: Self-pay | Admitting: *Deleted

## 2015-12-24 ENCOUNTER — Telehealth: Payer: Self-pay

## 2015-12-24 ENCOUNTER — Ambulatory Visit (HOSPITAL_COMMUNITY): Payer: Medicare Other | Admitting: Anesthesiology

## 2015-12-24 ENCOUNTER — Encounter (HOSPITAL_COMMUNITY)
Admission: RE | Disposition: A | Discharge status: Home / Self Care | Payer: Self-pay | Source: Ambulatory Visit | Attending: Gastroenterology

## 2015-12-24 ENCOUNTER — Encounter (HOSPITAL_COMMUNITY): Payer: Self-pay

## 2015-12-24 ENCOUNTER — Ambulatory Visit (HOSPITAL_COMMUNITY)
Admission: RE | Admit: 2015-12-24 | Discharge: 2015-12-24 | Disposition: A | Discharge status: Home / Self Care | Payer: Medicare Other | Source: Ambulatory Visit | Attending: Gastroenterology | Admitting: Gastroenterology

## 2015-12-24 DIAGNOSIS — R112 Nausea with vomiting, unspecified: Secondary | ICD-10-CM | POA: Diagnosis present

## 2015-12-24 DIAGNOSIS — Z90710 Acquired absence of both cervix and uterus: Secondary | ICD-10-CM | POA: Diagnosis not present

## 2015-12-24 DIAGNOSIS — Z9981 Dependence on supplemental oxygen: Secondary | ICD-10-CM | POA: Insufficient documentation

## 2015-12-24 DIAGNOSIS — Z79899 Other long term (current) drug therapy: Secondary | ICD-10-CM | POA: Insufficient documentation

## 2015-12-24 DIAGNOSIS — Z6841 Body Mass Index (BMI) 40.0 and over, adult: Secondary | ICD-10-CM | POA: Diagnosis not present

## 2015-12-24 DIAGNOSIS — N189 Chronic kidney disease, unspecified: Secondary | ICD-10-CM | POA: Insufficient documentation

## 2015-12-24 DIAGNOSIS — E7800 Pure hypercholesterolemia, unspecified: Secondary | ICD-10-CM | POA: Insufficient documentation

## 2015-12-24 DIAGNOSIS — Z96641 Presence of right artificial hip joint: Secondary | ICD-10-CM | POA: Insufficient documentation

## 2015-12-24 DIAGNOSIS — J449 Chronic obstructive pulmonary disease, unspecified: Secondary | ICD-10-CM | POA: Diagnosis not present

## 2015-12-24 DIAGNOSIS — K2970 Gastritis, unspecified, without bleeding: Secondary | ICD-10-CM | POA: Diagnosis not present

## 2015-12-24 DIAGNOSIS — I129 Hypertensive chronic kidney disease with stage 1 through stage 4 chronic kidney disease, or unspecified chronic kidney disease: Secondary | ICD-10-CM | POA: Insufficient documentation

## 2015-12-24 DIAGNOSIS — Z96651 Presence of right artificial knee joint: Secondary | ICD-10-CM | POA: Insufficient documentation

## 2015-12-24 DIAGNOSIS — Z794 Long term (current) use of insulin: Secondary | ICD-10-CM | POA: Diagnosis not present

## 2015-12-24 DIAGNOSIS — Z7982 Long term (current) use of aspirin: Secondary | ICD-10-CM | POA: Diagnosis not present

## 2015-12-24 DIAGNOSIS — E1122 Type 2 diabetes mellitus with diabetic chronic kidney disease: Secondary | ICD-10-CM | POA: Insufficient documentation

## 2015-12-24 DIAGNOSIS — Z87891 Personal history of nicotine dependence: Secondary | ICD-10-CM | POA: Insufficient documentation

## 2015-12-24 DIAGNOSIS — I4891 Unspecified atrial fibrillation: Secondary | ICD-10-CM | POA: Diagnosis not present

## 2015-12-24 DIAGNOSIS — Z993 Dependence on wheelchair: Secondary | ICD-10-CM | POA: Insufficient documentation

## 2015-12-24 DIAGNOSIS — K625 Hemorrhage of anus and rectum: Secondary | ICD-10-CM | POA: Diagnosis not present

## 2015-12-24 DIAGNOSIS — R109 Unspecified abdominal pain: Secondary | ICD-10-CM

## 2015-12-24 DIAGNOSIS — K319 Disease of stomach and duodenum, unspecified: Secondary | ICD-10-CM | POA: Diagnosis not present

## 2015-12-24 HISTORY — PX: ESOPHAGOGASTRODUODENOSCOPY (EGD) WITH PROPOFOL: SHX5813

## 2015-12-24 LAB — GLUCOSE, CAPILLARY: GLUCOSE-CAPILLARY: 136 mg/dL — AB (ref 65–99)

## 2015-12-24 SURGERY — ESOPHAGOGASTRODUODENOSCOPY (EGD) WITH PROPOFOL
Anesthesia: Monitor Anesthesia Care

## 2015-12-24 MED ORDER — PROPOFOL 10 MG/ML IV BOLUS
INTRAVENOUS | Status: AC
  Filled 2015-12-24: qty 40

## 2015-12-24 MED ORDER — ONDANSETRON HCL 4 MG/2ML IJ SOLN
INTRAMUSCULAR | Status: AC
  Filled 2015-12-24: qty 2

## 2015-12-24 MED ORDER — PROPOFOL 500 MG/50ML IV EMUL
INTRAVENOUS | Status: DC | PRN
  Administered 2015-12-24: 100 ug/kg/min via INTRAVENOUS

## 2015-12-24 MED ORDER — ONDANSETRON HCL 4 MG/2ML IJ SOLN
INTRAMUSCULAR | Status: DC | PRN
  Administered 2015-12-24: 4 mg via INTRAVENOUS

## 2015-12-24 MED ORDER — ONDANSETRON HCL 4 MG PO TABS: 4.0000 mg | ORAL_TABLET | Freq: Two times a day (BID) | ORAL | Status: DC

## 2015-12-24 MED ORDER — PHENYLEPHRINE HCL 10 MG/ML IJ SOLN
INTRAMUSCULAR | Status: DC | PRN
  Administered 2015-12-24 (×2): 80 ug via INTRAVENOUS

## 2015-12-24 MED ORDER — PHENYLEPHRINE 40 MCG/ML (10ML) SYRINGE FOR IV PUSH (FOR BLOOD PRESSURE SUPPORT)
PREFILLED_SYRINGE | INTRAVENOUS | Status: AC
  Filled 2015-12-24: qty 10

## 2015-12-24 MED ORDER — SODIUM CHLORIDE 0.9 % IV SOLN
INTRAVENOUS | Status: DC
  Administered 2015-12-24: 10:00:00 via INTRAVENOUS

## 2015-12-24 SURGICAL SUPPLY — 14 items

## 2015-12-24 NOTE — H&P (View-Only) (Signed)
HPI: This is a  very pleasant 82 year old woman    who was referred to me by Merwyn KatosSimonds, David B, MD  to evaluate  vomiting, rectal bleeding .    Chief complaint is rectal bleeding, coughing  Says her stomach is a problem.  About 2 hours after eating dinner she has a coughing fit and then this leads to excessive phlegm. There is no food but says there is a lot of phlegm. This goes on for hours.  This vomiting occurs she feels depends on what she eats.  Can occur 2-3 times per week.  She eats very little she says. This started 2-3 months ago.  Has not been losing weight.  Never throws up blood.  She is in afib.  Sees cardiologist in [place] believe. She was put on xarelto and noted rectal bleeding.  This was about 5 months ago. She explained this to her PCP and he recommended she stop xarelto and the bleeding stopped.  She has had several colonoscopies:  She has a GI doctor already; She thinks she had acolonoscopy last year at [facility]. She does not think that that MD is aware that she has had bleeding several months.  She does not think she's ever had EGD.  She is in a wheelchair and on 24 hour oxygen, she is morbidly obese with BMI >40.  She thinks eating cheese may bring on her symptoms.  CBC from February 2017 shows slight anemia   Review of systems: Pertinent positive and negative review of systems were noted in the above HPI section. Complete review of systems was performed and was otherwise normal.   Past Medical History  Diagnosis Date  . COPD (chronic obstructive pulmonary disease) (HCC)   . Atrial fibrillation (HCC)   . Hypertension   . Hypercholesteremia   . Pneumonia   . Diabetes mellitus without complication (HCC)     type 2  . Headache     migraines - years ago  . Shortness of breath dyspnea   . Asthma   . Dysrhythmia     afib  . Chronic kidney disease     history of insufficiency  . Arthritis   . Spinal disorder     stenosis  . Complication of anesthesia      difficulty to wake up after an 8 hour back surgery  . Edema     legs/feet  . Wheezing     Past Surgical History  Procedure Laterality Date  . Abdominal hysterectomy    . Shoulder fusion surgery Right   . Total knee arthroplasty Right     knee replacement  . Total hip arthroplasty Right     hip replacement  . Back surgery    . Cataract extraction      cataract surgery ? eye  . Fracture surgery Right     right arm 20 years ago  . Colonoscopy      x 3  . Orif distal radius fracture Left 06/17/2015  . Open reduction internal fixation (orif) distal radial fracture Left 06/17/2015    Procedure: OPEN REDUCTION INTERNAL FIXATION LEFT  DISTAL RADIUS AND ULNA FRACTURES;  Surgeon: Tarry KosNaiping M Xu, MD;  Location: [facility] OR;  Service: Orthopedics;  Laterality: Left;  . Joint replacement    . Hernia repair    . Cataract extraction w/phaco Left 07/28/2015    Procedure: CATARACT EXTRACTION PHACO AND INTRAOCULAR LENS PLACEMENT (IOC);  Surgeon: Galen ManilaWilliam Porfilio, MD;  Location: [facility] ORS;  Service: Ophthalmology;  Laterality: Left;  US 2:16     Current Outpatient Prescriptions  Medication Sig Dispense Refill  . albuterol (PROVENTIL HFA;VENTOLIN HFA) 108 (90 BASE) MCG/ACT inhaler Inhale 2 puffs into the lungs every 6 (six) hours as needed for wheezing or shortness of breath.    Marland Kitchen. albuterol (PROVENTIL) (2.5 MG/3ML) 0.083% nebulizer solution Take 2.5 mg by nebulization every 6 (six) hours as needed for wheezing or shortness of breath.    Marland Kitchen. aspirin EC 81 MG tablet Take 81 mg by mouth daily.    . calcium carbonate (TUMS EX) 750 MG chewable tablet Chew 1 tablet by mouth daily.    . Cholecalciferol (VITAMIN D-3) 1000 UNITS CAPS Take 1,000 Units by mouth daily.    Marland Kitchen. diltiazem (CARDIZEM CD) 180 MG 24 hr capsule     . fluticasone (FLONASE) 50 MCG/ACT nasal spray Place 1 spray into both nostrils daily. 16 g 2  . furosemide (LASIX) 40 MG tablet Take 40 mg by mouth daily.    . hydrocortisone valerate cream  (WESTCORT) 0.2 % Apply 1 application topically daily.    . insulin aspart (NOVOLOG) 100 UNIT/ML injection Inject 8-12 Units into the skin 3 (three) times daily with meals. Below 200=8 units and every 50 units above she gets one extra unit up to 12 units    . insulin glargine (LANTUS) 100 UNIT/ML injection Inject 30 Units into the skin every morning.     . meloxicam (MOBIC) 7.5 MG tablet Take 7.5 mg by mouth daily.    . metoprolol succinate (TOPROL-XL) 100 MG 24 hr tablet Take 100 mg by mouth daily. Take with or immediately following a meal.    . nystatin (MYCOSTATIN) powder Apply 1 g topically 2 (two) times daily as needed.    Marland Kitchen. omeprazole (PRILOSEC) 20 MG capsule Take 20 mg by mouth daily.    . potassium chloride SA (K-DUR,KLOR-CON) 20 MEQ tablet Take 20 mEq by mouth 2 (two) times daily.    . simvastatin (ZOCOR) 10 MG tablet Take 10 mg by mouth daily.    Marland Kitchen. umeclidinium-vilanterol (ANORO ELLIPTA) 62.5-25 MCG/INH AEPB Inhale 1 puff into the lungs daily.    Marland Kitchen. zolpidem (AMBIEN) 5 MG tablet Take 5 mg by mouth at bedtime as needed for sleep. Reported on 09/08/2015     No current facility-administered medications for this visit.    Allergies as of 12/08/2015 - Review Complete 12/08/2015  Allergen Reaction Noted  . Hydromorphone hcl Nausea And Vomiting 03/16/2015  . Ibuprofen Other (See Comments) 03/11/2015  . Mucinex [guaifenesin er] Other (See Comments) 04/06/2015  . Percocet [oxycodone-acetaminophen] Hives 03/11/2015    Family History  Problem Relation Age of Onset  . Heart disease Father     Social History   Social History  . Marital Status: Married    Spouse Name: N/A  . Number of Children: 2  . Years of Education: N/A   Occupational History  . retired    Social History Main Topics  . Smoking status: Former Smoker    Quit date: 06/15/1992  . Smokeless tobacco: Never Used  . Alcohol Use: 8.4 oz/week    7 Glasses of wine, 7 Shots of liquor, 0 Standard drinks or equivalent per  week     Comment: daily  . Drug Use: No  . Sexual Activity: Not on file   Other Topics Concern  . Not on file   Social History Narrative     Physical Exam: BP 94/64 mmHg  Pulse 68  Ht 5'  4" (1.626 m)  Wt 244 lb (110.678 kg)  BMI 41.86 kg/m2 Constitutional:Chronically ill-appearing with morbid obesity, sitting in a wheelchair with nasal cannula oxygen on  Psychiatric: alert and oriented x3 Eyes: extraocular movements intact Mouth: oral pharynx moist, no lesions Neck: supple no lymphadenopathy Cardiovascular: heart regular rate and rhythm Lungs: clear to auscultation bilaterally Abdomen: soft, nontender, nondistended, no obvious ascites, no peritoneal signs, normal bowel sounds Extremities: no lower extremity edema bilaterally Skin: no lesions on visible extremities   Assessment and plan: 82 y.o. female with  morbid obesity, frailty in a wheelchair, oxygen requiring COPD; intermittent abdominal pains and "vomiting", also recent rectal bleeding  First she tells me she gets colonoscopies quite frequently in [place]. She believes her most recent one was within the past year or 2. We will get records from that gastroenterologist here for review it she did seem to have rectal bleeding shortly after starting Xarelto, I'm not sure why but it does sound relatively low volume. The fact that she had a colonoscopy last year to seem to suggest that it is unlikely a sign of underlying colon cancer. She has fits of "vomiting" but to me these really seem like coughing fits. She actually describes severe coughing 2-3 times per week, usually about 2 hours after eating. After coughing for a very prolonged period time she describes extreme phlegm production and spitting out. She says no food never comes out. She said is not like vomiting. I recommended we proceed with upper endoscopy at her soonest convenience. This is absolute safest if it is done at the hospital given her comorbidities. She'll also  get a basic set of labs today including a CBC, complete metabolic profile, hemoglobin A1c.   Rob Bunting, MD [facility] 12/08/2015, 10:20 AM  Cc: Merwyn Katos, MD

## 2015-12-24 NOTE — Op Note (Signed)
[facility] Patient Name: Allison Shepard Procedure Date: 12/24/2015 MRN: [id number] Attending MD: [name] , MD Date of Birth: 1934-06-22 CSN: [id number] Age: 82 Admit Type: Outpatient Procedure:                Upper GI endoscopy Indications:              Nausea with vomiting Providers:                [name], MD, [name], RN, [name], Technician Referring MD:             [name], MD Medicines:                Monitored Anesthesia Care Complications:            No immediate complications. Estimated blood loss:                            None. Estimated Blood Loss:     Estimated blood loss: none. Procedure:                Pre-Anesthesia Assessment:                           - Prior to the procedure, a History and Physical                            was performed, and patient medications and                            allergies were reviewed. The patient's tolerance of                            previous anesthesia was also reviewed. The risks                            and benefits of the procedure and the sedation                            options and risks were discussed with the patient.                            All questions were answered, and informed consent                            was obtained. Prior Anticoagulants: The patient has                            taken no previous anticoagulant or antiplatelet                            agents. ASA Grade Assessment: IV - A patient with  severe systemic disease that is a constant threat                            to life. After reviewing the risks and benefits,                            the patient was deemed in satisfactory condition to                            undergo the procedure.                           After obtaining informed consent, the endoscope was                            passed under direct vision.  Throughout the                            procedure, the patient's blood pressure, pulse, and                            oxygen saturations were monitored continuously. The                            EG-[id number] ([id number]) scope was introduced through the                            mouth, and advanced to the second part of duodenum.                            The upper GI endoscopy was accomplished without                            difficulty. The patient tolerated the procedure                            fairly well. Scope In: Scope Out: Findings:      The esophagus was normal.      Scattered mild inflammation was found in the entire examined stomach.       Biopsies were taken with a cold forceps for histology.      There was a thin mucosal bar of tissue at an otherwise normal appearing       pylorus.      The examined duodenum was normal. Impression:               - Normal esophagus.                           - Gastritis. Biopsied.                           - Mucosal bar of tissue at pylorus, this is                            unlikely of any clinical  signficance. It is                            possibly related to previous peptic disease.                           - Normal examined duodenum. Moderate Sedation:      N/A- Per Anesthesia Care Recommendation:           - Patient has a contact number available for                            emergencies. The signs and symptoms of potential                            delayed complications were discussed with the                            patient. Return to normal activities tomorrow.                            Written discharge instructions were provided to the                            patient.                           - Resume previous diet.                           - Continue present medications. Please also start                            twice daily zofran for chronic nausea, vomiting. A                            new  prescription was called in to your pharmacy.                           - Await pathology results. If biospies show H.                            pylori, you will be treated with appropriate                            antibiotics.                           - Still awaiting reports from your [name]                            Gastroenterologist regarding previous                            colonoscopies. Given your tenuous pulmonary  function it is definitely in your best interest to                            not have further invasive testing unless absolutely                            necessary. Procedure Code(s):        --- Professional ---                           [id number], Esophagogastroduodenoscopy, flexible,                            transoral; with biopsy, single or multiple Diagnosis Code(s):        --- Professional ---                           K29.70, Gastritis, unspecified, without bleeding                           R11.2, Nausea with vomiting, unspecified CPT copyright 2016 American Medical Association. All rights reserved. The codes documented in this report are preliminary and upon coder review may  be revised to meet current compliance requirements. Rachael Feeaniel P Mamie Diiorio, MD 12/24/2015 10:40:20 AM This report has been signed electronically. Number of Addenda: 0

## 2015-12-24 NOTE — Discharge Instructions (Signed)

## 2015-12-24 NOTE — Transfer of Care (Signed)
Immediate Anesthesia Transfer of Care Note  Patient: Herminio HeadsMargaret M Lawlor  Procedure(s) Performed: Procedure(s): ESOPHAGOGASTRODUODENOSCOPY (EGD) WITH PROPOFOL (N/A)  Patient Location: PACU  Anesthesia Type:MAC  Level of Consciousness:  sedated, patient cooperative and responds to stimulation  Airway & Oxygen Therapy:Patient Spontanous Breathing and Patient connected to face mask oxgen  Post-op Assessment:  Report given to PACU RN and Post -op Vital signs reviewed and stable  Post vital signs:  Reviewed and stable  Last Vitals:  Filed Vitals:   12/24/15 0937 12/24/15 1042  BP: 138/90 116/86  Pulse: 95 83  Temp: 36.5 C   Resp: 16 29    Complications: No apparent anesthesia complications

## 2015-12-24 NOTE — Anesthesia Postprocedure Evaluation (Signed)
Anesthesia Post Note  Patient: Allison Shepard  Procedure(s) Performed: Procedure(s) (LRB): ESOPHAGOGASTRODUODENOSCOPY (EGD) WITH PROPOFOL (N/A)  Patient location during evaluation: PACU Anesthesia Type: MAC Level of consciousness: awake and alert Pain management: pain level controlled Vital Signs Assessment: post-procedure vital signs reviewed and stable Respiratory status: spontaneous breathing, nonlabored ventilation, respiratory function stable and patient connected to nasal cannula oxygen Cardiovascular status: stable and blood pressure returned to baseline Anesthetic complications: no    Last Vitals:  Filed Vitals:   12/24/15 1100 12/24/15 1105  BP: 119/93   Pulse: 93 58  Temp: 35.8 C   Resp: 20 17    Last Pain: There were no vitals filed for this visit.               Jiles GarterJACKSON,Allison Shepard

## 2015-12-24 NOTE — Telephone Encounter (Signed)
Release re faxed and [facility] called to request records ASAP, they are faxing over a colon and path from 2013 today i will put it on your desk when received.

## 2015-12-24 NOTE — Interval H&P Note (Signed)
History and Physical Interval Note:  12/24/2015 9:52 AM  Allison Shepard  has presented today for surgery, with the diagnosis of abd pain vomiting  The various methods of treatment have been discussed with the patient and family. After consideration of risks, benefits and other options for treatment, the patient has consented to  Procedure(s): ESOPHAGOGASTRODUODENOSCOPY (EGD) WITH PROPOFOL (N/A) as a surgical intervention .  The patient's history has been reviewed, patient examined, no change in status, stable for surgery.  I have reviewed the patient's chart and labs.  Questions were answered to the patient's satisfaction.     Rachael FeeJacobs, Manreet Kiernan P

## 2015-12-24 NOTE — Telephone Encounter (Signed)
-----   Message from Rachael Feeaniel P Jacobs, MD sent at 12/24/2015 10:43 AM EDT ----- I still haven't seen her colonoscopy reports from GI in [place]. She believes she's had colonoscopy in past 1-2 years.  Can you call them again to get them sent over.  Drue [employer]th[employer]

## 2015-12-24 NOTE — Anesthesia Preprocedure Evaluation (Addendum)
Anesthesia Evaluation  Patient identified by MRN, date of birth, ID band Patient awake    Reviewed: Allergy & Precautions, H&P , Patient's Chart, lab work & pertinent test results, reviewed documented beta blocker date and time   Airway Mallampati: II  TM Distance: >3 FB Neck ROM: full    Dental no notable dental hx.    Pulmonary former smoker,    Pulmonary exam normal breath sounds clear to auscultation       Cardiovascular hypertension,  Rhythm:regular Rate:Normal     Neuro/Psych    GI/Hepatic   Endo/Other  diabetes  Renal/GU      Musculoskeletal   Abdominal   Peds  Hematology   Anesthesia Other Findings COPD ..... Home O2, RA sat 84%  Atrial fibrillation (HCC)    Hypertension     Pneumonia    Diabetes mellitus without complication  type 2 Asthma    Dysrhythmia afib Chronic kidney diseasehistory of insufficiency  Arthritis   Spinal disorder  stenosis  Complication of anesthesia; difficulty to wake up after 8hr surgery  Edemalegs/feet  Wheezing      Reproductive/Obstetrics                            Anesthesia Physical Anesthesia Plan  ASA: III  Anesthesia Plan: MAC   Post-op Pain Management:    Induction: Intravenous  Airway Management Planned: Mask and Natural Airway  Additional Equipment:   Intra-op Plan:   Post-operative Plan:   Informed Consent: I have reviewed the patients History and Physical, chart, labs and discussed the procedure including the risks, benefits and alternatives for the proposed anesthesia with the patient or authorized representative who has indicated his/her understanding and acceptance.   Dental Advisory Given  Plan Discussed with: CRNA and Surgeon  Anesthesia Plan Comments: (Discussed sedation and potential to need to place airway or ETT if warranted by clinical changes intra-operatively. We will start procedure as MAC.)         Anesthesia Quick Evaluation

## 2015-12-25 ENCOUNTER — Telehealth: Payer: Self-pay

## 2015-12-25 ENCOUNTER — Encounter (HOSPITAL_COMMUNITY): Payer: Self-pay | Admitting: Gastroenterology

## 2015-12-25 NOTE — Telephone Encounter (Signed)
Pt had endo yesterday and was given zofran to take twice daily, she took 1 this morning at 8 am and began itching all over her body around 3 pm especially the arms and the bottom of the feet. She has no rash, just itching.  She has allergies to percocet and hydrocodone.  She thinks the zofran is causing the reaction.  She has not taken anything for the itching.  No SOB, she is on 24 hour Oxygen. Please advise.

## 2015-12-25 NOTE — Telephone Encounter (Signed)
Great, thanks

## 2015-12-25 NOTE — Telephone Encounter (Signed)
Encounter closed in error; Dr Christella HartiganJacobs please review.

## 2015-12-25 NOTE — Telephone Encounter (Signed)
Verbal order from Dr Christella HartiganJacobs to have the pt take one  25 mg benadryl and stop the zofran.  If she worsens or develops shortness of breathe she should go to the ED.  Dr Christella HartiganJacobs will send in another antiemetic.  The pt verbalized understanding of all instructions.

## 2015-12-28 NOTE — Telephone Encounter (Signed)
i agree, thanks 

## 2015-12-30 ENCOUNTER — Emergency Department
Admission: EM | Admit: 2015-12-30 | Discharge: 2015-12-30 | Disposition: A | Discharge status: Home / Self Care | Payer: Medicare Other | Source: Home / Self Care | Attending: Emergency Medicine | Admitting: Emergency Medicine

## 2015-12-30 ENCOUNTER — Emergency Department: Payer: Medicare Other

## 2015-12-30 DIAGNOSIS — Y939 Activity, unspecified: Secondary | ICD-10-CM | POA: Insufficient documentation

## 2015-12-30 DIAGNOSIS — J45909 Unspecified asthma, uncomplicated: Secondary | ICD-10-CM | POA: Insufficient documentation

## 2015-12-30 DIAGNOSIS — M25562 Pain in left knee: Secondary | ICD-10-CM

## 2015-12-30 DIAGNOSIS — Z87891 Personal history of nicotine dependence: Secondary | ICD-10-CM

## 2015-12-30 DIAGNOSIS — J9601 Acute respiratory failure with hypoxia: Secondary | ICD-10-CM | POA: Insufficient documentation

## 2015-12-30 DIAGNOSIS — W19XXXA Unspecified fall, initial encounter: Secondary | ICD-10-CM

## 2015-12-30 DIAGNOSIS — S8002XA Contusion of left knee, initial encounter: Secondary | ICD-10-CM

## 2015-12-30 DIAGNOSIS — N189 Chronic kidney disease, unspecified: Secondary | ICD-10-CM | POA: Insufficient documentation

## 2015-12-30 DIAGNOSIS — M1990 Unspecified osteoarthritis, unspecified site: Secondary | ICD-10-CM

## 2015-12-30 DIAGNOSIS — I129 Hypertensive chronic kidney disease with stage 1 through stage 4 chronic kidney disease, or unspecified chronic kidney disease: Secondary | ICD-10-CM | POA: Insufficient documentation

## 2015-12-30 DIAGNOSIS — W1800XA Striking against unspecified object with subsequent fall, initial encounter: Secondary | ICD-10-CM

## 2015-12-30 DIAGNOSIS — Z794 Long term (current) use of insulin: Secondary | ICD-10-CM

## 2015-12-30 DIAGNOSIS — Z79899 Other long term (current) drug therapy: Secondary | ICD-10-CM

## 2015-12-30 DIAGNOSIS — Y999 Unspecified external cause status: Secondary | ICD-10-CM

## 2015-12-30 DIAGNOSIS — Y92009 Unspecified place in unspecified non-institutional (private) residence as the place of occurrence of the external cause: Secondary | ICD-10-CM

## 2015-12-30 DIAGNOSIS — Z7982 Long term (current) use of aspirin: Secondary | ICD-10-CM

## 2015-12-30 DIAGNOSIS — N390 Urinary tract infection, site not specified: Secondary | ICD-10-CM | POA: Insufficient documentation

## 2015-12-30 DIAGNOSIS — E1122 Type 2 diabetes mellitus with diabetic chronic kidney disease: Secondary | ICD-10-CM

## 2015-12-30 DIAGNOSIS — J441 Chronic obstructive pulmonary disease with (acute) exacerbation: Secondary | ICD-10-CM | POA: Insufficient documentation

## 2015-12-30 DIAGNOSIS — R440 Auditory hallucinations: Secondary | ICD-10-CM | POA: Insufficient documentation

## 2015-12-30 DIAGNOSIS — E875 Hyperkalemia: Secondary | ICD-10-CM | POA: Diagnosis not present

## 2015-12-30 DIAGNOSIS — I130 Hypertensive heart and chronic kidney disease with heart failure and stage 1 through stage 4 chronic kidney disease, or unspecified chronic kidney disease: Secondary | ICD-10-CM | POA: Diagnosis not present

## 2015-12-30 LAB — URINALYSIS COMPLETE WITH MICROSCOPIC (ARMC ONLY)
Bilirubin Urine: NEGATIVE
GLUCOSE, UA: NEGATIVE mg/dL
HGB URINE DIPSTICK: NEGATIVE
Ketones, ur: NEGATIVE mg/dL
Nitrite: POSITIVE — AB
PROTEIN: 30 mg/dL — AB
SPECIFIC GRAVITY, URINE: 1.017 (ref 1.005–1.030)
pH: 5 (ref 5.0–8.0)

## 2015-12-30 LAB — BASIC METABOLIC PANEL
Anion gap: 6 (ref 5–15)
BUN: 52 mg/dL — AB (ref 6–20)
CALCIUM: 8.4 mg/dL — AB (ref 8.9–10.3)
CO2: 26 mmol/L (ref 22–32)
CREATININE: 2.07 mg/dL — AB (ref 0.44–1.00)
Chloride: 107 mmol/L (ref 101–111)
GFR, EST AFRICAN AMERICAN: 25 mL/min — AB (ref >60)
GFR, EST NON AFRICAN AMERICAN: 22 mL/min — AB (ref >60)
Glucose, Bld: 100 mg/dL — ABNORMAL HIGH (ref 65–99)
Potassium: 5.4 mmol/L — ABNORMAL HIGH (ref 3.5–5.1)
SODIUM: 139 mmol/L (ref 135–145)

## 2015-12-30 LAB — CBC
HEMATOCRIT: 30.3 % — AB (ref 35.0–47.0)
Hemoglobin: 9.2 g/dL — ABNORMAL LOW (ref 12.0–16.0)
MCH: 24.9 pg — AB (ref 26.0–34.0)
MCHC: 30.3 g/dL — ABNORMAL LOW (ref 32.0–36.0)
MCV: 82.2 fL (ref 80.0–100.0)
PLATELETS: 248 10*3/uL (ref 150–440)
RBC: 3.69 MIL/uL — AB (ref 3.80–5.20)
RDW: 21.3 % — AB (ref 11.5–14.5)
WBC: 5.1 10*3/uL (ref 3.6–11.0)

## 2015-12-30 LAB — COMPREHENSIVE METABOLIC PANEL
ALBUMIN: 3.6 g/dL (ref 3.5–5.0)
ALT: 35 U/L (ref 14–54)
AST: 26 U/L (ref 15–41)
Alkaline Phosphatase: 153 U/L — ABNORMAL HIGH (ref 38–126)
Anion gap: 6 (ref 5–15)
BUN: 53 mg/dL — AB (ref 6–20)
CHLORIDE: 105 mmol/L (ref 101–111)
CO2: 28 mmol/L (ref 22–32)
CREATININE: 2.25 mg/dL — AB (ref 0.44–1.00)
Calcium: 8.7 mg/dL — ABNORMAL LOW (ref 8.9–10.3)
GFR calc Af Amer: 23 mL/min — ABNORMAL LOW (ref >60)
GFR calc non Af Amer: 20 mL/min — ABNORMAL LOW (ref >60)
Glucose, Bld: 97 mg/dL (ref 65–99)
Potassium: 5.7 mmol/L — ABNORMAL HIGH (ref 3.5–5.1)
SODIUM: 139 mmol/L (ref 135–145)
Total Bilirubin: 1.2 mg/dL (ref 0.3–1.2)
Total Protein: 6.7 g/dL (ref 6.5–8.1)

## 2015-12-30 MED ORDER — CEPHALEXIN 500 MG PO CAPS: 500.0000 mg | ORAL_CAPSULE | Freq: Three times a day (TID) | ORAL | Status: DC

## 2015-12-30 MED ORDER — SODIUM CHLORIDE 0.9 % IV BOLUS (SEPSIS)
1000.0000 mL | Freq: Once | INTRAVENOUS | Status: AC
  Administered 2015-12-30: 1000 mL via INTRAVENOUS

## 2015-12-30 MED ORDER — DEXTROSE 5 % IV SOLN
1.0000 g | Freq: Once | INTRAVENOUS | Status: AC
  Administered 2015-12-30: 1 g via INTRAVENOUS
  Filled 2015-12-30: qty 10

## 2015-12-30 NOTE — ED Notes (Signed)
Pt comes into the ED via EMS from home with c/o tripping and falling over a rug at home and hitting her head and left knee pain.. Pt husband reports pt has been having visual and auditory hallucinations. Pt is lethargic in triage, pt fell asleep in triage.. Pt is on continuous 2L [place]..Marland Kitchen

## 2015-12-30 NOTE — Discharge Instructions (Signed)
As we discussed it is very important that you do not take your potassium supplements for the next few days until you can follow up with your primary care doctor. You should get your potassium rechecked in the next couple of days. Please seek medical attention for any high fevers, chest pain, shortness of breath, change in behavior, persistent vomiting, bloody stool or any other new or concerning symptoms.   Urinary Tract Infection A urinary tract infection (UTI) can occur any place along the urinary tract. The tract includes the kidneys, ureters, bladder, and urethra. A type of germ called bacteria often causes a UTI. UTIs are often helped with antibiotic medicine.  HOME CARE   If given, take antibiotics as told by your doctor. Finish them even if you start to feel better.  Drink enough fluids to keep your pee (urine) clear or pale yellow.  Avoid tea, drinks with caffeine, and bubbly (carbonated) drinks.  Pee often. Avoid holding your pee in for a long time.  Pee before and after having sex (intercourse).  Wipe from front to back after you poop (bowel movement) if you are a woman. Use each tissue only once. GET HELP RIGHT AWAY IF:   You have back pain.  You have lower belly (abdominal) pain.  You have chills.  You feel sick to your stomach (nauseous).  You throw up (vomit).  Your burning or discomfort with peeing does not go away.  You have a fever.  Your symptoms are not better in 3 days. MAKE SURE YOU:   Understand these instructions.  Will watch your condition.  Will get help right away if you are not doing well or get worse.   This information is not intended to replace advice given to you by your health care provider. Make sure you discuss any questions you have with your health care provider.   Document Released: 01/18/2008 Document Revised: 08/22/2014 Document Reviewed: 03/01/2012 Elsevier Interactive Patient Education [employer]2016 Elsevier Inc.

## 2015-12-30 NOTE — ED Provider Notes (Signed)
[facility] Emergency Department Provider Note   ____________________________________________  Time seen: ~1635  I have reviewed the triage vital signs and the nursing notes.   HISTORY  Chief Complaint Fall   History limited by: Not Limited   HPI Allison Shepard is a [age] y.o. female who presents to the emergency department today after a fall. The fall happened roughly 9-1/2 hours prior to my examination. She fell off the couch and hit the rug. He patient states she is primarily having pain in her left knee. She also states she hit her head. In addition thehusband states that he has noticed that she is been having more delusions. He states this is been going on for months. It sounds like he has not discussed this with her doctor although did discuss it with his own doctor. No recent fevers. The patient denies any chest pain or shortness breath.   Past Medical History  Diagnosis Date  . COPD (chronic obstructive pulmonary disease) (HCC)   . Atrial fibrillation (HCC)   . Hypertension   . Hypercholesteremia   . Pneumonia   . Diabetes mellitus without complication (HCC)     type 2  . Headache     migraines - years ago  . Shortness of breath dyspnea   . Asthma   . Dysrhythmia     afib  . Chronic kidney disease     history of insufficiency  . Arthritis   . Spinal disorder     stenosis  . Complication of anesthesia     difficulty to wake up after an 8 hour back surgery  . Edema     legs/feet  . Wheezing     Patient Active Problem List   Diagnosis Date Noted  . Acute respiratory failure with hypoxia (HCC) 07/17/2015  . Left upper lobe pneumonia 07/17/2015  . Hemoptysis 07/17/2015  . Atrial fibrillation with RVR (HCC) 07/17/2015  . Hypotension 07/17/2015  . Renal insufficiency 07/17/2015  . Obesity 07/17/2015  . Metabolic encephalopathy 07/17/2015  . Fracture of radius, distal, with ulna, left, closed 06/17/2015  . S/P ORIF (open reduction  internal fixation) fracture 06/17/2015  . Diaphragmatic hernia 04/07/2015  . Restrictive lung disease secondary to obesity 04/07/2015  . Pneumonia 04/07/2015  . COPD exacerbation (HCC) 04/07/2015  . Other emphysema (HCC) 04/07/2015  . Morbid obesity (HCC) 04/07/2015  . Debility 04/07/2015  . Sepsis (HCC) 04/06/2015  . Chronic a-fib (HCC) 03/20/2015  . Acute respiratory distress (HCC) 03/16/2015    Past Surgical History  Procedure Laterality Date  . Abdominal hysterectomy    . Shoulder fusion surgery Right   . Total knee arthroplasty Right     knee replacement  . Total hip arthroplasty Right     hip replacement  . Back surgery    . Cataract extraction      cataract surgery ? eye  . Fracture surgery Right     right arm 20 years ago  . Colonoscopy      x 3  . Orif distal radius fracture Left 06/17/2015  . Open reduction internal fixation (orif) distal radial fracture Left 06/17/2015    Procedure: OPEN REDUCTION INTERNAL FIXATION LEFT  DISTAL RADIUS AND ULNA FRACTURES;  Surgeon: Tarry KosNaiping M Xu, MD;  Location: [facility] OR;  Service: Orthopedics;  Laterality: Left;  . Joint replacement    . Hernia repair    . Cataract extraction w/phaco Left 07/28/2015    Procedure: CATARACT EXTRACTION PHACO AND INTRAOCULAR LENS PLACEMENT (IOC);  Surgeon: Galen Manila, MD;  Location: [facility] ORS;  Service: Ophthalmology;  Laterality: Left;  [place] 2:16   . Esophagogastroduodenoscopy (egd) with propofol N/A 12/24/2015    Procedure: ESOPHAGOGASTRODUODENOSCOPY (EGD) WITH PROPOFOL;  Surgeon: [name], MD;  Location: [facility] ENDOSCOPY;  Service: Endoscopy;  Laterality: N/A;    Current Outpatient Rx  Name  Route  Sig  Dispense  Refill  . albuterol (PROVENTIL HFA;VENTOLIN HFA) 108 (90 BASE) MCG/ACT inhaler   Inhalation   Inhale 2 puffs into the lungs every 6 (six) hours as needed for wheezing or shortness of breath.         Marland Kitchen albuterol (PROVENTIL) (2.5 MG/3ML) 0.083% nebulizer solution   Nebulization   Take  2.5 mg by nebulization every 6 (six) hours as needed for wheezing or shortness of breath.         Marland Kitchen aspirin EC 81 MG tablet   Oral   Take 81 mg by mouth daily.         . calcium carbonate (TUMS EX) 750 MG chewable tablet   Oral   Chew 1 tablet by mouth daily.         . Cholecalciferol (VITAMIN D-3) 1000 UNITS CAPS   Oral   Take 1,000 Units by mouth daily.         Marland Kitchen diltiazem (CARDIZEM CD) 180 MG 24 hr capsule   Oral   Take 180 mg by mouth daily.          . fluticasone (FLONASE) 50 MCG/ACT nasal spray   Each Nare   Place 1 spray into both nostrils daily.   16 g   2   . furosemide (LASIX) 40 MG tablet   Oral   Take 40 mg by mouth daily.         . hydrocortisone valerate cream (WESTCORT) 0.2 %   Topical   Apply 1 application topically daily.         . insulin aspart (NOVOLOG) 100 UNIT/ML injection   Subcutaneous   Inject 8-12 Units into the skin 3 (three) times daily with meals. Below 200=8 units and every 50 units above she gets two extra units up to 12 units.         . insulin glargine (LANTUS) 100 UNIT/ML injection   Subcutaneous   Inject 26 Units into the skin every morning.          Marland Kitchen levothyroxine (SYNTHROID, LEVOTHROID) 100 MCG tablet   Oral   Take 100 mcg by mouth daily before breakfast.      11   . meloxicam (MOBIC) 7.5 MG tablet   Oral   Take 7.5 mg by mouth daily as needed for pain.          . metoprolol succinate (TOPROL-XL) 100 MG 24 hr tablet   Oral   Take 100 mg by mouth daily. Take with or immediately following a meal.         . nystatin (MYCOSTATIN) powder   Topical   Apply 1 g topically 2 (two) times daily as needed.         Marland Kitchen omeprazole (PRILOSEC) 20 MG capsule   Oral   Take 20 mg by mouth daily.         . ondansetron (ZOFRAN) 4 MG tablet   Oral   Take 1 tablet (4 mg total) by mouth 2 (two) times daily.   60 tablet   6   . potassium chloride SA (K-DUR,KLOR-CON) 20 MEQ tablet   Oral  Take 20 mEq by mouth  daily.          . simvastatin (ZOCOR) 10 MG tablet   Oral   Take 10 mg by mouth daily.         Marland Kitchen zolpidem (AMBIEN) 5 MG tablet   Oral   Take 5 mg by mouth at bedtime as needed for sleep. Reported on 09/08/2015           Allergies Hydrocodone-chlorpheniramine; Hydromorphone hcl; Ibuprofen; Mucinex; and Percocet  Family History  Problem Relation Age of Onset  . Heart disease Father     Social History Social History  Substance Use Topics  . Smoking status: Former Smoker    Quit date: 06/15/1992  . Smokeless tobacco: Never Used  . Alcohol Use: 8.4 oz/week    7 Glasses of wine, 7 Shots of liquor, 0 Standard drinks or equivalent per week     Comment: daily    Review of Systems  Constitutional: Negative for fever. Cardiovascular: Negative for chest pain. Respiratory: Negative for shortness of breath. Gastrointestinal: Negative for abdominal pain, vomiting and diarrhea. Neurological: Negative for headaches, focal weakness or numbness.  10-point ROS otherwise negative.  ____________________________________________   PHYSICAL EXAM:  VITAL SIGNS: ED Triage Vitals  Enc Vitals Group     BP --      Pulse Rate 12/30/15 1242 93     Resp 12/30/15 1242 20     Temp 12/30/15 1242 97.7 F (36.5 C)     Temp Source 12/30/15 1242 Oral     SpO2 12/30/15 1242 96 %     Weight 12/30/15 1242 240 lb (108.863 kg)     Height 12/30/15 1242 5\' 4"  (1.626 m)     Head Cir --      Peak Flow --      Pain Score 12/30/15 1255 5   Constitutional: Alert and oriented. Well appearing and in no distress. Eyes: Conjunctivae are normal. PERRL. Normal extraocular movements. ENT   Head: Normocephalic and atraumatic.   Nose: No congestion/rhinnorhea.   Mouth/Throat: Mucous membranes are moist.   Neck: No stridor. Hematological/Lymphatic/Immunilogical: No cervical lymphadenopathy. Cardiovascular: Normal rate, regular rhythm.  No murmurs, rubs, or gallops. Respiratory: Normal  respiratory effort without tachypnea nor retractions. Breath sounds are clear and equal bilaterally. No wheezes/rales/rhonchi. Gastrointestinal: Soft and nontender. No distention.  Genitourinary: Deferred Musculoskeletal: Bruising and tenderness noted over the left patella. No obvious joint effusion. No gross deformity. Sensation and movement intact distally. No other extremity tenderness or swelling. Neurologic:  Normal speech and language. No gross focal neurologic deficits are appreciated.  Skin:  Skin is warm, dry and intact. No rash noted. Psychiatric: Mood and affect are normal. Speech and behavior are normal. Patient exhibits appropriate insight and judgment.  ____________________________________________    LABS (pertinent positives/negatives)  Labs Reviewed  COMPREHENSIVE METABOLIC PANEL - Abnormal; Notable for the following:    Potassium 5.7 (*)    BUN 53 (*)    Creatinine, Ser 2.25 (*)    Calcium 8.7 (*)    Alkaline Phosphatase 153 (*)    GFR calc non Af Amer 20 (*)    GFR calc Af Amer 23 (*)    All other components within normal limits  CBC - Abnormal; Notable for the following:    RBC 3.69 (*)    Hemoglobin 9.2 (*)    HCT 30.3 (*)    MCH 24.9 (*)    MCHC 30.3 (*)    RDW 21.3 (*)  All other components within normal limits  URINALYSIS COMPLETEWITH MICROSCOPIC ([facility] ONLY) - Abnormal; Notable for the following:    Color, Urine YELLOW (*)    APPearance CLEAR (*)    Protein, ur 30 (*)    Nitrite POSITIVE (*)    Leukocytes, UA TRACE (*)    Bacteria, UA MANY (*)    Squamous Epithelial / LPF 0-5 (*)    All other components within normal limits  BASIC METABOLIC PANEL - Abnormal; Notable for the following:    Potassium 5.4 (*)    Glucose, Bld 100 (*)    BUN 52 (*)    Creatinine, Ser 2.07 (*)    Calcium 8.4 (*)    GFR calc non Af Amer 22 (*)    GFR calc Af Amer 25 (*)    All other components within normal limits      ____________________________________________   EKG  I, Phineas Semen, attending physician, personally viewed and interpreted this EKG  EKG Time: 1315 Rate: 90 Rhythm: atrial fibrillation Axis: normal Intervals: qtc 472 QRS: narrow ST changes: no st elevation Impression: abnormal ekg ____________________________________________    RADIOLOGY  CT head IMPRESSION: 1. No evidence of acute intracranial abnormality. 2. Unchanged cerebral atrophy and chronic small vessel ischemic disease. 3. Large bilateral mastoid effusions.  CXR IMPRESSION: No acute osseous findings demonstrated. Tricompartmental degenerative changes with meniscal chondrocalcinosis and small joint effusion.  ____________________________________________   PROCEDURES  Procedure(s) performed: None  Critical Care performed: No  ____________________________________________   INITIAL IMPRESSION / ASSESSMENT AND PLAN / ED COURSE  Pertinent labs & imaging results that were available during my care of the patient were reviewed by me and considered in my medical decision making (see chart for details).  Patient presents to the emergency department today because of concern for primarily in the pain after a fall. The patient also states she hit her head. On exam patient did have some bruising over the left knee. Will check a head CT as well as left knee x-rays. Blood work showed a mild hyperkalemia and mild elevation of the patient's creatinine. Will give some fluids.  ----------------------------------------- 11:04 PM on 12/30/2015 -----------------------------------------  Urine was concerning for urinary tract infection. Patient was given a dose of IV antibiotics. Patient's repeat BMP did show some decrease in potassium as well as decrease in creatinine. Discussed with patient and husband that she should not take her potassium until she can follow up with her primary care doctor. Will discharge home  on antibiotics. ____________________________________________   FINAL CLINICAL IMPRESSION(S) / ED DIAGNOSES  Final diagnoses:  UTI (lower urinary tract infection)  Fall, initial encounter  Left knee pain     Phineas Semen, MD 12/30/15 2306

## 2015-12-31 ENCOUNTER — Inpatient Hospital Stay
Admission: EM | Admit: 2015-12-31 | Discharge: 2016-01-06 | DRG: 291 | Disposition: A | Discharge status: Skilled Nursing Facility | Payer: Medicare Other | Attending: Internal Medicine | Admitting: Internal Medicine

## 2015-12-31 ENCOUNTER — Encounter: Payer: Self-pay | Admitting: Emergency Medicine

## 2015-12-31 ENCOUNTER — Emergency Department: Payer: Medicare Other

## 2015-12-31 DIAGNOSIS — Z87891 Personal history of nicotine dependence: Secondary | ICD-10-CM | POA: Diagnosis not present

## 2015-12-31 DIAGNOSIS — E875 Hyperkalemia: Secondary | ICD-10-CM

## 2015-12-31 DIAGNOSIS — R4182 Altered mental status, unspecified: Secondary | ICD-10-CM | POA: Diagnosis present

## 2015-12-31 DIAGNOSIS — Z794 Long term (current) use of insulin: Secondary | ICD-10-CM

## 2015-12-31 DIAGNOSIS — E7800 Pure hypercholesterolemia, unspecified: Secondary | ICD-10-CM | POA: Diagnosis present

## 2015-12-31 DIAGNOSIS — E1122 Type 2 diabetes mellitus with diabetic chronic kidney disease: Secondary | ICD-10-CM | POA: Diagnosis present

## 2015-12-31 DIAGNOSIS — I509 Heart failure, unspecified: Secondary | ICD-10-CM

## 2015-12-31 DIAGNOSIS — Z79899 Other long term (current) drug therapy: Secondary | ICD-10-CM

## 2015-12-31 DIAGNOSIS — N2581 Secondary hyperparathyroidism of renal origin: Secondary | ICD-10-CM | POA: Diagnosis present

## 2015-12-31 DIAGNOSIS — N184 Chronic kidney disease, stage 4 (severe): Secondary | ICD-10-CM | POA: Diagnosis present

## 2015-12-31 DIAGNOSIS — Z9981 Dependence on supplemental oxygen: Secondary | ICD-10-CM | POA: Diagnosis not present

## 2015-12-31 DIAGNOSIS — Z6841 Body Mass Index (BMI) 40.0 and over, adult: Secondary | ICD-10-CM

## 2015-12-31 DIAGNOSIS — M1990 Unspecified osteoarthritis, unspecified site: Secondary | ICD-10-CM | POA: Diagnosis present

## 2015-12-31 DIAGNOSIS — Z7982 Long term (current) use of aspirin: Secondary | ICD-10-CM | POA: Diagnosis not present

## 2015-12-31 DIAGNOSIS — J9621 Acute and chronic respiratory failure with hypoxia: Secondary | ICD-10-CM | POA: Diagnosis present

## 2015-12-31 DIAGNOSIS — Z8249 Family history of ischemic heart disease and other diseases of the circulatory system: Secondary | ICD-10-CM | POA: Diagnosis not present

## 2015-12-31 DIAGNOSIS — E662 Morbid (severe) obesity with alveolar hypoventilation: Secondary | ICD-10-CM | POA: Diagnosis present

## 2015-12-31 DIAGNOSIS — M8580 Other specified disorders of bone density and structure, unspecified site: Secondary | ICD-10-CM | POA: Diagnosis present

## 2015-12-31 DIAGNOSIS — T17908A Unspecified foreign body in respiratory tract, part unspecified causing other injury, initial encounter: Secondary | ICD-10-CM

## 2015-12-31 DIAGNOSIS — I130 Hypertensive heart and chronic kidney disease with heart failure and stage 1 through stage 4 chronic kidney disease, or unspecified chronic kidney disease: Secondary | ICD-10-CM | POA: Diagnosis present

## 2015-12-31 DIAGNOSIS — I482 Chronic atrial fibrillation: Secondary | ICD-10-CM | POA: Diagnosis present

## 2015-12-31 DIAGNOSIS — I5031 Acute diastolic (congestive) heart failure: Secondary | ICD-10-CM

## 2015-12-31 DIAGNOSIS — M4806 Spinal stenosis, lumbar region: Secondary | ICD-10-CM | POA: Diagnosis present

## 2015-12-31 DIAGNOSIS — N179 Acute kidney failure, unspecified: Secondary | ICD-10-CM | POA: Diagnosis present

## 2015-12-31 DIAGNOSIS — E1121 Type 2 diabetes mellitus with diabetic nephropathy: Secondary | ICD-10-CM | POA: Diagnosis present

## 2015-12-31 DIAGNOSIS — Z96651 Presence of right artificial knee joint: Secondary | ICD-10-CM | POA: Diagnosis present

## 2015-12-31 DIAGNOSIS — I5033 Acute on chronic diastolic (congestive) heart failure: Secondary | ICD-10-CM | POA: Diagnosis present

## 2015-12-31 DIAGNOSIS — J45909 Unspecified asthma, uncomplicated: Secondary | ICD-10-CM | POA: Diagnosis present

## 2015-12-31 DIAGNOSIS — J441 Chronic obstructive pulmonary disease with (acute) exacerbation: Secondary | ICD-10-CM

## 2015-12-31 DIAGNOSIS — R0689 Other abnormalities of breathing: Secondary | ICD-10-CM | POA: Diagnosis not present

## 2015-12-31 DIAGNOSIS — Z96641 Presence of right artificial hip joint: Secondary | ICD-10-CM | POA: Diagnosis present

## 2015-12-31 DIAGNOSIS — Z515 Encounter for palliative care: Secondary | ICD-10-CM | POA: Diagnosis not present

## 2015-12-31 DIAGNOSIS — E785 Hyperlipidemia, unspecified: Secondary | ICD-10-CM | POA: Diagnosis present

## 2015-12-31 DIAGNOSIS — J690 Pneumonitis due to inhalation of food and vomit: Secondary | ICD-10-CM | POA: Diagnosis not present

## 2015-12-31 DIAGNOSIS — J9601 Acute respiratory failure with hypoxia: Secondary | ICD-10-CM

## 2015-12-31 LAB — COMPREHENSIVE METABOLIC PANEL
ALBUMIN: 3.5 g/dL (ref 3.5–5.0)
ALK PHOS: 150 U/L — AB (ref 38–126)
ALT: 37 U/L (ref 14–54)
AST: 32 U/L (ref 15–41)
Anion gap: 7 (ref 5–15)
BILIRUBIN TOTAL: 1.4 mg/dL — AB (ref 0.3–1.2)
BUN: 55 mg/dL — AB (ref 6–20)
CO2: 27 mmol/L (ref 22–32)
Calcium: 8.6 mg/dL — ABNORMAL LOW (ref 8.9–10.3)
Chloride: 106 mmol/L (ref 101–111)
Creatinine, Ser: 2.14 mg/dL — ABNORMAL HIGH (ref 0.44–1.00)
GFR calc Af Amer: 24 mL/min — ABNORMAL LOW (ref >60)
GFR calc non Af Amer: 21 mL/min — ABNORMAL LOW (ref >60)
GLUCOSE: 130 mg/dL — AB (ref 65–99)
POTASSIUM: 5.5 mmol/L — AB (ref 3.5–5.1)
Sodium: 140 mmol/L (ref 135–145)
TOTAL PROTEIN: 7 g/dL (ref 6.5–8.1)

## 2015-12-31 LAB — URINALYSIS COMPLETE WITH MICROSCOPIC (ARMC ONLY)
BACTERIA UA: NONE SEEN
Bilirubin Urine: NEGATIVE
GLUCOSE, UA: NEGATIVE mg/dL
Hgb urine dipstick: NEGATIVE
KETONES UR: NEGATIVE mg/dL
LEUKOCYTES UA: NEGATIVE
NITRITE: NEGATIVE
PROTEIN: NEGATIVE mg/dL
SPECIFIC GRAVITY, URINE: 1.009 (ref 1.005–1.030)
WBC, UA: NONE SEEN WBC/hpf (ref 0–5)
pH: 5 (ref 5.0–8.0)

## 2015-12-31 LAB — BLOOD GAS, ARTERIAL
ACID-BASE EXCESS: 0.7 mmol/L (ref 0.0–3.0)
Allens test (pass/fail): POSITIVE — AB
Bicarbonate: 26 mEq/L (ref 21.0–28.0)
FIO2: 0.36
O2 SAT: 94.8 %
PH ART: 7.38 (ref 7.350–7.450)
Patient temperature: 37
pCO2 arterial: 44 mmHg (ref 32.0–48.0)
pO2, Arterial: 76 mmHg — ABNORMAL LOW (ref 83.0–108.0)

## 2015-12-31 LAB — LACTIC ACID, PLASMA: Lactic Acid, Venous: 1.2 mmol/L (ref 0.5–2.0)

## 2015-12-31 LAB — CBC WITH DIFFERENTIAL/PLATELET
BASOS ABS: 0 10*3/uL (ref 0–0.1)
Basophils Relative: 1 %
EOS PCT: 1 %
Eosinophils Absolute: 0.1 10*3/uL (ref 0–0.7)
HCT: 30.6 % — ABNORMAL LOW (ref 35.0–47.0)
Hemoglobin: 9.3 g/dL — ABNORMAL LOW (ref 12.0–16.0)
LYMPHS PCT: 8 %
Lymphs Abs: 0.4 10*3/uL — ABNORMAL LOW (ref 1.0–3.6)
MCH: 25 pg — AB (ref 26.0–34.0)
MCHC: 30.3 g/dL — AB (ref 32.0–36.0)
MCV: 82.7 fL (ref 80.0–100.0)
MONO ABS: 0.6 10*3/uL (ref 0.2–0.9)
MONOS PCT: 12 %
Neutro Abs: 3.6 10*3/uL (ref 1.4–6.5)
Neutrophils Relative %: 78 %
PLATELETS: 246 10*3/uL (ref 150–440)
RBC: 3.7 MIL/uL — ABNORMAL LOW (ref 3.80–5.20)
RDW: 21 % — AB (ref 11.5–14.5)
WBC: 4.6 10*3/uL (ref 3.6–11.0)

## 2015-12-31 LAB — TROPONIN I: Troponin I: <0.03 ng/mL (ref <0.031)

## 2015-12-31 LAB — BRAIN NATRIURETIC PEPTIDE: B Natriuretic Peptide: 1094 pg/mL — ABNORMAL HIGH (ref 0.0–100.0)

## 2015-12-31 LAB — GLUCOSE, CAPILLARY: GLUCOSE-CAPILLARY: 133 mg/dL — AB (ref 65–99)

## 2015-12-31 MED ORDER — INSULIN GLARGINE 100 UNIT/ML ~~LOC~~ SOLN
26.0000 [IU] | Freq: Every morning | SUBCUTANEOUS | Status: DC
  Administered 2016-01-01 – 2016-01-02 (×2): 26 [IU] via SUBCUTANEOUS
  Filled 2015-12-31 (×3): qty 0.26

## 2015-12-31 MED ORDER — ALBUTEROL SULFATE (2.5 MG/3ML) 0.083% IN NEBU: 2.5000 mg | INHALATION_SOLUTION | Freq: Four times a day (QID) | RESPIRATORY_TRACT | Status: DC | PRN

## 2015-12-31 MED ORDER — DEXTROSE 5 % IV SOLN
500.0000 mg | Freq: Once | INTRAVENOUS | Status: AC
  Administered 2015-12-31: 500 mg via INTRAVENOUS
  Filled 2015-12-31: qty 500

## 2015-12-31 MED ORDER — ALBUTEROL SULFATE (2.5 MG/3ML) 0.083% IN NEBU
2.5000 mg | INHALATION_SOLUTION | Freq: Once | RESPIRATORY_TRACT | Status: AC
  Administered 2015-12-31: 2.5 mg via RESPIRATORY_TRACT
  Filled 2015-12-31: qty 3

## 2015-12-31 MED ORDER — INSULIN ASPART 100 UNIT/ML ~~LOC~~ SOLN
8.0000 [IU] | Freq: Three times a day (TID) | SUBCUTANEOUS | Status: DC
  Administered 2016-01-01: 8 [IU] via SUBCUTANEOUS
  Filled 2015-12-31: qty 8

## 2015-12-31 MED ORDER — LEVOTHYROXINE SODIUM 100 MCG PO TABS
100.0000 ug | ORAL_TABLET | Freq: Every day | ORAL | Status: DC
  Administered 2016-01-01 – 2016-01-06 (×6): 100 ug via ORAL
  Filled 2015-12-31 (×7): qty 1

## 2015-12-31 MED ORDER — SENNOSIDES-DOCUSATE SODIUM 8.6-50 MG PO TABS: 1.0000 | ORAL_TABLET | Freq: Every evening | ORAL | Status: DC | PRN

## 2015-12-31 MED ORDER — ALBUTEROL SULFATE HFA 108 (90 BASE) MCG/ACT IN AERS: 2.0000 | INHALATION_SPRAY | Freq: Four times a day (QID) | RESPIRATORY_TRACT | Status: DC | PRN

## 2015-12-31 MED ORDER — ONDANSETRON HCL 4 MG PO TABS
4.0000 mg | ORAL_TABLET | Freq: Two times a day (BID) | ORAL | Status: DC
  Administered 2015-12-31 – 2016-01-06 (×10): 4 mg via ORAL
  Filled 2015-12-31 (×10): qty 1

## 2015-12-31 MED ORDER — ALBUTEROL SULFATE (2.5 MG/3ML) 0.083% IN NEBU: 2.5000 mg | INHALATION_SOLUTION | RESPIRATORY_TRACT | Status: DC | PRN

## 2015-12-31 MED ORDER — SIMVASTATIN 20 MG PO TABS
10.0000 mg | ORAL_TABLET | Freq: Every day | ORAL | Status: DC
  Administered 2016-01-01 – 2016-01-06 (×5): 10 mg via ORAL
  Filled 2015-12-31 (×6): qty 1

## 2015-12-31 MED ORDER — ACETAMINOPHEN 325 MG PO TABS
650.0000 mg | ORAL_TABLET | Freq: Four times a day (QID) | ORAL | Status: DC | PRN
  Administered 2016-01-01 – 2016-01-04 (×3): 650 mg via ORAL
  Filled 2015-12-31 (×3): qty 2

## 2015-12-31 MED ORDER — ACETAMINOPHEN 650 MG RE SUPP: 650.0000 mg | Freq: Four times a day (QID) | RECTAL | Status: DC | PRN

## 2015-12-31 MED ORDER — IPRATROPIUM-ALBUTEROL 0.5-2.5 (3) MG/3ML IN SOLN
3.0000 mL | Freq: Once | RESPIRATORY_TRACT | Status: AC
  Administered 2015-12-31: 3 mL via RESPIRATORY_TRACT
  Filled 2015-12-31: qty 3

## 2015-12-31 MED ORDER — BISACODYL 10 MG RE SUPP: 10.0000 mg | Freq: Every day | RECTAL | Status: DC | PRN

## 2015-12-31 MED ORDER — VITAMIN D 1000 UNITS PO TABS
1000.0000 [IU] | ORAL_TABLET | Freq: Every day | ORAL | Status: DC
  Administered 2016-01-01 – 2016-01-06 (×5): 1000 [IU] via ORAL
  Filled 2015-12-31 (×7): qty 1

## 2015-12-31 MED ORDER — METHYLPREDNISOLONE SODIUM SUCC 125 MG IJ SOLR
125.0000 mg | Freq: Once | INTRAMUSCULAR | Status: AC
  Administered 2015-12-31: 125 mg via INTRAVENOUS
  Filled 2015-12-31: qty 2

## 2015-12-31 MED ORDER — PANTOPRAZOLE SODIUM 40 MG PO TBEC
40.0000 mg | DELAYED_RELEASE_TABLET | Freq: Every day | ORAL | Status: DC
  Administered 2016-01-01 – 2016-01-06 (×5): 40 mg via ORAL
  Filled 2015-12-31 (×6): qty 1

## 2015-12-31 MED ORDER — ASPIRIN EC 81 MG PO TBEC
81.0000 mg | DELAYED_RELEASE_TABLET | Freq: Every day | ORAL | Status: DC
  Administered 2016-01-01 – 2016-01-06 (×5): 81 mg via ORAL
  Filled 2015-12-31 (×6): qty 1

## 2015-12-31 MED ORDER — METOPROLOL SUCCINATE ER 100 MG PO TB24
100.0000 mg | ORAL_TABLET | Freq: Every day | ORAL | Status: DC
  Administered 2015-12-31 – 2016-01-04 (×4): 100 mg via ORAL
  Filled 2015-12-31 (×5): qty 1

## 2015-12-31 MED ORDER — CEPHALEXIN 500 MG PO CAPS
500.0000 mg | ORAL_CAPSULE | Freq: Three times a day (TID) | ORAL | Status: DC
  Administered 2015-12-31 – 2016-01-01 (×3): 500 mg via ORAL
  Filled 2015-12-31 (×3): qty 1

## 2015-12-31 MED ORDER — ZOLPIDEM TARTRATE 5 MG PO TABS
5.0000 mg | ORAL_TABLET | Freq: Every evening | ORAL | Status: DC | PRN
  Administered 2016-01-02: 5 mg via ORAL
  Filled 2015-12-31: qty 1

## 2015-12-31 MED ORDER — DILTIAZEM HCL ER COATED BEADS 180 MG PO CP24
180.0000 mg | ORAL_CAPSULE | Freq: Every day | ORAL | Status: DC
  Administered 2015-12-31 – 2016-01-06 (×6): 180 mg via ORAL
  Filled 2015-12-31 (×7): qty 1

## 2015-12-31 MED ORDER — FUROSEMIDE 10 MG/ML IJ SOLN
20.0000 mg | Freq: Two times a day (BID) | INTRAMUSCULAR | Status: DC
  Administered 2015-12-31 – 2016-01-03 (×6): 20 mg via INTRAVENOUS
  Filled 2015-12-31 (×6): qty 2

## 2015-12-31 MED ORDER — CALCIUM CARBONATE ANTACID 500 MG PO CHEW
1.5000 | CHEWABLE_TABLET | Freq: Every day | ORAL | Status: DC
  Administered 2016-01-01 – 2016-01-06 (×5): 300 mg via ORAL
  Filled 2015-12-31: qty 1
  Filled 2015-12-31: qty 2
  Filled 2015-12-31 (×5): qty 1

## 2015-12-31 NOTE — ED Provider Notes (Signed)
[facility] Emergency Department Provider Note  ____________________________________________    I have reviewed the triage vital signs and the nursing notes.   HISTORY  Chief Complaint Respiratory Distress    HPI Allison Shepard is a 82 y.o. female who presents with significant shortness of breath. When she awoke this morning she was unable to catch her breath and unable to get up and her husband called EMS. Patient is visibly short of breath. She does wear oxygen at home for history of COPD and sees pulmonology. EMS placed her on 4 L to try to keep her oxygen saturations elevated. Husband also reports that the patient has been altered over the last 2 days and has been confused. Dr. Darrol Angel is her pulmonologist, Dr. Lady Gary is her cardiologist. Patient was seen yesterday in the emergency department after a fall and diagnosed with urinary tract infection. Husband reports that she had pneumonia in the past that "almost killed her"     Past Medical History  Diagnosis Date  . COPD (chronic obstructive pulmonary disease) (HCC)   . Atrial fibrillation (HCC)   . Hypertension   . Hypercholesteremia   . Pneumonia   . Diabetes mellitus without complication (HCC)     type 2  . Headache     migraines - years ago  . Shortness of breath dyspnea   . Asthma   . Dysrhythmia     afib  . Chronic kidney disease     history of insufficiency  . Arthritis   . Spinal disorder     stenosis  . Complication of anesthesia     difficulty to wake up after an 8 hour back surgery  . Edema     legs/feet  . Wheezing     Patient Active Problem List   Diagnosis Date Noted  . Acute respiratory failure with hypoxia (HCC) 07/17/2015  . Left upper lobe pneumonia 07/17/2015  . Hemoptysis 07/17/2015  . Atrial fibrillation with RVR (HCC) 07/17/2015  . Hypotension 07/17/2015  . Renal insufficiency 07/17/2015  . Obesity 07/17/2015  . Metabolic encephalopathy 07/17/2015  . Fracture  of radius, distal, with ulna, left, closed 06/17/2015  . S/P ORIF (open reduction internal fixation) fracture 06/17/2015  . Diaphragmatic hernia 04/07/2015  . Restrictive lung disease secondary to obesity 04/07/2015  . Pneumonia 04/07/2015  . COPD exacerbation (HCC) 04/07/2015  . Other emphysema (HCC) 04/07/2015  . Morbid obesity (HCC) 04/07/2015  . Debility 04/07/2015  . Sepsis (HCC) 04/06/2015  . Chronic a-fib (HCC) 03/20/2015  . Acute respiratory distress (HCC) 03/16/2015    Past Surgical History  Procedure Laterality Date  . Abdominal hysterectomy    . Shoulder fusion surgery Right   . Total knee arthroplasty Right     knee replacement  . Total hip arthroplasty Right     hip replacement  . Back surgery    . Cataract extraction      cataract surgery ? eye  . Fracture surgery Right     right arm 20 years ago  . Colonoscopy      x 3  . Orif distal radius fracture Left 06/17/2015  . Open reduction internal fixation (orif) distal radial fracture Left 06/17/2015    Procedure: OPEN REDUCTION INTERNAL FIXATION LEFT  DISTAL RADIUS AND ULNA FRACTURES;  Surgeon: Tarry Kos, MD;  Location: [facility] OR;  Service: Orthopedics;  Laterality: Left;  . Joint replacement    . Hernia repair    . Cataract extraction w/phaco Left 07/28/2015  Procedure: CATARACT EXTRACTION PHACO AND INTRAOCULAR LENS PLACEMENT (IOC);  Surgeon: Galen ManilaWilliam Porfilio, MD;  Location: [facility] ORS;  Service: Ophthalmology;  Laterality: Left;  US 2:16   . Esophagogastroduodenoscopy (egd) with propofol N/A 12/24/2015    Procedure: ESOPHAGOGASTRODUODENOSCOPY (EGD) WITH PROPOFOL;  Surgeon: Rachael Feeaniel P Jacobs, MD;  Location: [facility] ENDOSCOPY;  Service: Endoscopy;  Laterality: N/A;    Current Outpatient Rx  Name  Route  Sig  Dispense  Refill  . albuterol (PROVENTIL HFA;VENTOLIN HFA) 108 (90 BASE) MCG/ACT inhaler   Inhalation   Inhale 2 puffs into the lungs every 6 (six) hours as needed for wheezing or shortness of breath.         Marland Kitchen.  albuterol (PROVENTIL) (2.5 MG/3ML) 0.083% nebulizer solution   Nebulization   Take 2.5 mg by nebulization every 6 (six) hours as needed for wheezing or shortness of breath.         Marland Kitchen. aspirin EC 81 MG tablet   Oral   Take 81 mg by mouth daily.         . calcium carbonate (TUMS EX) 750 MG chewable tablet   Oral   Chew 1 tablet by mouth daily.         . cephALEXin (KEFLEX) 500 MG capsule   Oral   Take 1 capsule (500 mg total) by mouth 3 (three) times daily.   30 capsule   0   . Cholecalciferol (VITAMIN D-3) 1000 UNITS CAPS   Oral   Take 1,000 Units by mouth daily.         Marland Kitchen. diltiazem (CARDIZEM CD) 180 MG 24 hr capsule   Oral   Take 180 mg by mouth daily.          . furosemide (LASIX) 40 MG tablet   Oral   Take 40 mg by mouth daily.         . hydrocortisone valerate cream (WESTCORT) 0.2 %   Topical   Apply 1 application topically daily.         . insulin aspart (NOVOLOG) 100 UNIT/ML injection   Subcutaneous   Inject 8-12 Units into the skin 3 (three) times daily with meals. Below 200=8 units and every 50 units above she gets two extra units up to 12 units.         . insulin glargine (LANTUS) 100 UNIT/ML injection   Subcutaneous   Inject 26 Units into the skin every morning.          Marland Kitchen. levothyroxine (SYNTHROID, LEVOTHROID) 100 MCG tablet   Oral   Take 100 mcg by mouth daily before breakfast.      11   . meloxicam (MOBIC) 7.5 MG tablet   Oral   Take 7.5 mg by mouth daily as needed for pain.          . metoprolol succinate (TOPROL-XL) 100 MG 24 hr tablet   Oral   Take 100 mg by mouth daily. Take with or immediately following a meal.         . nystatin (MYCOSTATIN) powder   Topical   Apply 1 g topically 2 (two) times daily as needed.         Marland Kitchen. omeprazole (PRILOSEC) 20 MG capsule   Oral   Take 20 mg by mouth daily.         . ondansetron (ZOFRAN) 4 MG tablet   Oral   Take 1 tablet by mouth 2 (two) times daily.      6   . potassium  chloride SA (K-DUR,KLOR-CON) 20 MEQ tablet   Oral   Take 20 mEq by mouth daily.          . simvastatin (ZOCOR) 10 MG tablet   Oral   Take 10 mg by mouth daily.         Marland Kitchen zolpidem (AMBIEN) 5 MG tablet   Oral   Take 5 mg by mouth at bedtime as needed for sleep. Reported on 09/08/2015           Allergies Hydrocodone-chlorpheniramine; Hydromorphone hcl; Ibuprofen; Mucinex; and Percocet  Family History  Problem Relation Age of Onset  . Heart disease Father     Social History Social History  Substance Use Topics  . Smoking status: Former Smoker    Quit date: 06/15/1992  . Smokeless tobacco: Never Used  . Alcohol Use: 8.4 oz/week    7 Glasses of wine, 7 Shots of liquor, 0 Standard drinks or equivalent per week     Comment: daily    Review of Systems  Constitutional: Negative for fever. Eyes: Negative for Blurry vision ENT: Negative for Throat swelling Cardiovascular: Negative for chest pain Respiratory: As above Gastrointestinal: Negative for abdominal pain Genitourinary: Negative for dysuria. Musculoskeletal: Chronic back pain Skin: Negative for rash. Neurological: Negative for focal weakness Psychiatric: no anxiety    ____________________________________________   PHYSICAL EXAM:  VITAL SIGNS: ED Triage Vitals  Enc Vitals Group     BP 12/31/15 1030 149/96 mmHg     Pulse Rate 12/31/15 1013 105     Resp 12/31/15 1013 25     Temp 12/31/15 1013 98.5 F (36.9 C)     Temp Source 12/31/15 1013 Oral     SpO2 12/31/15 1030 95 %     Weight 12/31/15 1013 240 lb (108.863 kg)     Height 12/31/15 1013 5\' 5"  (1.651 m)     Head Cir --      Peak Flow --      Pain Score --      Pain Loc --      Pain Edu? --      Excl. in GC? --      Constitutional: Ill-appearing, obese Eyes: Conjunctivae are normal. No erythema or injection ENT   Head: Normocephalic and atraumatic.   Mouth/Throat: Mucous membranes are moist. Cardiovascular: Tachycardia, irregularly  irregular rhythm. Normal and symmetric distal pulses are present in the upper extremities. No murmurs or rubs  Respiratory: Increased respiratory effort with tachypnea.. Wheezing bilaterally Gastrointestinal: Soft and non-tender in all quadrants. No distention. There is no CVA tenderness. Genitourinary: deferred Musculoskeletal: Nontender with normal range of motion in all extremities. Edema bilaterally Neurologic:  Normal speech and language. No gross focal neurologic deficits are appreciated. Skin:  Skin is warm, dry and intact. No rash noted. Psychiatric: Mood and affect are normal. Patient exhibits appropriate insight and judgment.  ____________________________________________    LABS (pertinent positives/negatives)  Labs Reviewed  COMPREHENSIVE METABOLIC PANEL - Abnormal; Notable for the following:    Potassium 5.5 (*)    Glucose, Bld 130 (*)    BUN 55 (*)    Creatinine, Ser 2.14 (*)    Calcium 8.6 (*)    Alkaline Phosphatase 150 (*)    Total Bilirubin 1.4 (*)    GFR calc non Af Amer 21 (*)    GFR calc Af Amer 24 (*)    All other components within normal limits  CBC WITH DIFFERENTIAL/PLATELET - Abnormal; Notable for the following:    RBC 3.70 (*)  Hemoglobin 9.3 (*)    HCT 30.6 (*)    MCH 25.0 (*)    MCHC 30.3 (*)    RDW 21.0 (*)    Lymphs Abs 0.4 (*)    All other components within normal limits  URINALYSIS COMPLETEWITH MICROSCOPIC ([facility] ONLY) - Abnormal; Notable for the following:    Color, Urine YELLOW (*)    APPearance CLEAR (*)    Squamous Epithelial / LPF 0-5 (*)    All other components within normal limits  BRAIN NATRIURETIC PEPTIDE - Abnormal; Notable for the following:    B Natriuretic Peptide 1094.0 (*)    All other components within normal limits  GLUCOSE, CAPILLARY - Abnormal; Notable for the following:    Glucose-Capillary 133 (*)    All other components within normal limits  BLOOD GAS, ARTERIAL - Abnormal; Notable for the following:    pO2, Arterial  76 (*)    Allens test (pass/fail) POSITIVE (*)    All other components within normal limits  CULTURE, BLOOD (ROUTINE X 2)  CULTURE, BLOOD (ROUTINE X 2)  URINE CULTURE  LACTIC ACID, PLASMA  TROPONIN I    ____________________________________________   EKG  ED ECG REPORT I, Jene Every, the attending physician, personally viewed and interpreted this ECG.  Date: 12/31/2015 EKG Time: 10:09 AM Rate: 113 Rhythm: Atrial fibrillation QRS Axis: Right axis Intervals: normal ST/T Wave abnormalities: normal Conduction Disturbances: none    ____________________________________________    RADIOLOGY  Chest x-ray limited but no acute distress  ____________________________________________   PROCEDURES  Procedure(s) performed: none  Critical Care performed: none  ____________________________________________   INITIAL IMPRESSION / ASSESSMENT AND PLAN / ED COURSE  Pertinent labs & imaging results that were available during my care of the patient were reviewed by me and considered in my medical decision making (see chart for details).  Patient with significant shortness of breath on arrival, she has a history of significant COPD for which she follows pulmonology. We will treat with nebs, steroids and watch closely. Patient's ABG demonstrate mild hypoxia but CO2 is normal. She is hyperkalemic as well and requiring increased oxygen. Suspect exacerbation of COPD as a cause of her shortness of breath.  She will require admission for further management.  ____________________________________________   FINAL CLINICAL IMPRESSION(S) / ED DIAGNOSES  Final diagnoses:  Acute respiratory failure with hypoxia (HCC)  Hyperkalemia  COPD with exacerbation (HCC)          Jene Every, MD 12/31/15 1434

## 2015-12-31 NOTE — ED Notes (Signed)
As RN entered room to assess pt, IV was pulled out by pt. New IV restarted at this time. Site clean dry and needle intact, bleeding controlled. NAD at this time.

## 2015-12-31 NOTE — ED Notes (Signed)
Pt seen and treated yesterday for UTI in ED. She awakened SOB and unable to get up and walk so husband called EMS. Pt is having difficulty breathing; O2 sats in 70's and 80's on 2L with EMS. They placed pt on 4 L for transport. EMS states that pt became unresponsive for a few seconds while they were there, and she lost bladder control at that time. Pt's husband told ems that pt also has altered mental status today. Pt has hx of uncontrolled Afib.

## 2015-12-31 NOTE — ED Notes (Signed)
Patient repositioned in bed, sitting up with [place] at 4L.

## 2015-12-31 NOTE — ED Notes (Signed)
CODE SEPSIS CALLED TO RHONDA AT [facility] 

## 2015-12-31 NOTE — H&P (Signed)
[facility] - [place] at [facility]   PATIENT NAME: Allison Shepard    MR#:  [id number]  DATE OF BIRTH:  [birth year]  DATE OF ADMISSION:  12/31/2015  PRIMARY CARE PHYSICIAN: Mickey Farber, MD   REQUESTING/REFERRING PHYSICIAN: Dr Cyril Loosen  CHIEF COMPLAINT:  Increasing shortness of breath for last several days overall not feeling well.  HISTORY OF PRESENT ILLNESS:  Allison Shepard  is a 82 y.o. female with a known history ofCOPD, chronic respiratory failure on 2 L nasal canal oxygen, morbid obesity, chronic atrial fibrillation off anticoagulation secondary to history of GI bleed, type 2 diabetes, hyperlipidemia comes to the emergency room accompanied by her husband complaining of increasing shortness of breath very hard to get around at home and found to have hypoxia on her 2 L nasal cannula oxygen. Her sats dropped down in the upper 80s. She is currently sleepy and according to the husband did not sleep all last night because she was very uncomfortable and could not get her breath out. She was seen in the emergency room yesterday started on Keflex for UTI sent home. Patient is being admitted with congestive heart failure. She has elevated BNP. She has leg edema. And she was recently seen in cardiology office for the same.  PAST MEDICAL HISTORY:   Past Medical History  Diagnosis Date  . COPD (chronic obstructive pulmonary disease) (HCC)   . Atrial fibrillation (HCC)   . Hypertension   . Hypercholesteremia   . Pneumonia   . Diabetes mellitus without complication (HCC)     type 2  . Headache     migraines - years ago  . Shortness of breath dyspnea   . Asthma   . Dysrhythmia     afib  . Chronic kidney disease     history of insufficiency  . Arthritis   . Spinal disorder     stenosis  . Complication of anesthesia     difficulty to wake up after an 8 hour back surgery  . Edema     legs/feet  . Wheezing     PAST SURGICAL HISTOIRY:   Past Surgical  History  Procedure Laterality Date  . Abdominal hysterectomy    . Shoulder fusion surgery Right   . Total knee arthroplasty Right     knee replacement  . Total hip arthroplasty Right     hip replacement  . Back surgery    . Cataract extraction      cataract surgery ? eye  . Fracture surgery Right     right arm 20 years ago  . Colonoscopy      x 3  . Orif distal radius fracture Left 06/17/2015  . Open reduction internal fixation (orif) distal radial fracture Left 06/17/2015    Procedure: OPEN REDUCTION INTERNAL FIXATION LEFT  DISTAL RADIUS AND ULNA FRACTURES;  Surgeon: Tarry Kos, MD;  Location: [facility] OR;  Service: Orthopedics;  Laterality: Left;  . Joint replacement    . Hernia repair    . Cataract extraction w/phaco Left 07/28/2015    Procedure: CATARACT EXTRACTION PHACO AND INTRAOCULAR LENS PLACEMENT (IOC);  Surgeon: Galen Manila, MD;  Location: [facility] ORS;  Service: Ophthalmology;  Laterality: Left;  [place] 2:16   . Esophagogastroduodenoscopy (egd) with propofol N/A 12/24/2015    Procedure: ESOPHAGOGASTRODUODENOSCOPY (EGD) WITH PROPOFOL;  Surgeon: [name], MD;  Location: [facility] ENDOSCOPY;  Service: Endoscopy;  Laterality: N/A;    SOCIAL HISTORY:   Social History  Substance Use Topics  .  Smoking status: Former Smoker    Quit date: 06/15/1992  . Smokeless tobacco: Never Used  . Alcohol Use: 8.4 oz/week    7 Glasses of wine, 7 Shots of liquor, 0 Standard drinks or equivalent per week     Comment: daily    FAMILY HISTORY:   Family History  Problem Relation Age of Onset  . Heart disease Father     DRUG ALLERGIES:   Allergies  Allergen Reactions  . Hydrocodone-Chlorpheniramine Other (See Comments)    Confusion  . Hydromorphone Hcl Nausea And Vomiting  . Ibuprofen Other (See Comments)    Pt was told by her MD not to take this medication.    . Mucinex [Guaifenesin Er] Other (See Comments)    Red all over. Felt like i was on fire  . Percocet [Oxycodone-Acetaminophen]  Hives    REVIEW OF SYSTEMS:  Review of Systems  Constitutional: Positive for malaise/fatigue. Negative for fever, chills and weight loss.  HENT: Negative for ear discharge, ear pain and nosebleeds.   Eyes: Negative for blurred vision, pain and discharge.  Respiratory: Positive for shortness of breath. Negative for sputum production, wheezing and stridor.   Cardiovascular: Positive for leg swelling. Negative for chest pain, palpitations, orthopnea and PND.  Gastrointestinal: Negative for nausea, vomiting, abdominal pain and diarrhea.  Genitourinary: Negative for urgency and frequency.  Musculoskeletal: Negative for back pain and joint pain.  Neurological: Positive for weakness. Negative for sensory change, speech change and focal weakness.  Psychiatric/Behavioral: Negative for depression and hallucinations. The patient is not nervous/anxious.   All other systems reviewed and are negative.    MEDICATIONS AT HOME:   Prior to Admission medications   Medication Sig Start Date End Date Taking? Authorizing Provider  albuterol (PROVENTIL HFA;VENTOLIN HFA) 108 (90 BASE) MCG/ACT inhaler Inhale 2 puffs into the lungs every 6 (six) hours as needed for wheezing or shortness of breath.    Historical Provider, MD  albuterol (PROVENTIL) (2.5 MG/3ML) 0.083% nebulizer solution Take 2.5 mg by nebulization every 6 (six) hours as needed for wheezing or shortness of breath.    Historical Provider, MD  aspirin EC 81 MG tablet Take 81 mg by mouth daily.    Historical Provider, MD  calcium carbonate (TUMS EX) 750 MG chewable tablet Chew 1 tablet by mouth daily.    Historical Provider, MD  cephALEXin (KEFLEX) 500 MG capsule Take 1 capsule (500 mg total) by mouth 3 (three) times daily. 12/30/15   Phineas SemenGraydon Goodman, MD  Cholecalciferol (VITAMIN D-3) 1000 UNITS CAPS Take 1,000 Units by mouth daily.    Historical Provider, MD  diltiazem (CARDIZEM CD) 180 MG 24 hr capsule Take 180 mg by mouth daily.  09/10/15   Historical  Provider, MD  furosemide (LASIX) 40 MG tablet Take 40 mg by mouth daily.    Historical Provider, MD  hydrocortisone valerate cream (WESTCORT) 0.2 % Apply 1 application topically daily.    Historical Provider, MD  insulin aspart (NOVOLOG) 100 UNIT/ML injection Inject 8-12 Units into the skin 3 (three) times daily with meals. Below 200=8 units and every 50 units above she gets two extra units up to 12 units.    Historical Provider, MD  insulin glargine (LANTUS) 100 UNIT/ML injection Inject 26 Units into the skin every morning.     Historical Provider, MD  levothyroxine (SYNTHROID, LEVOTHROID) 100 MCG tablet Take 100 mcg by mouth daily before breakfast. 11/13/15   Historical Provider, MD  meloxicam (MOBIC) 7.5 MG tablet Take 7.5 mg by mouth  daily as needed for pain.     Historical Provider, MD  metoprolol succinate (TOPROL-XL) 100 MG 24 hr tablet Take 100 mg by mouth daily. Take with or immediately following a meal.    Historical Provider, MD  nystatin (MYCOSTATIN) powder Apply 1 g topically 2 (two) times daily as needed.    Historical Provider, MD  omeprazole (PRILOSEC) 20 MG capsule Take 20 mg by mouth daily.    Historical Provider, MD  ondansetron (ZOFRAN) 4 MG tablet Take 1 tablet by mouth 2 (two) times daily. 12/24/15   Historical Provider, MD  potassium chloride SA (K-DUR,KLOR-CON) 20 MEQ tablet Take 20 mEq by mouth daily.     Historical Provider, MD  simvastatin (ZOCOR) 10 MG tablet Take 10 mg by mouth daily.    Historical Provider, MD  zolpidem (AMBIEN) 5 MG tablet Take 5 mg by mouth at bedtime as needed for sleep. Reported on 09/08/2015    Historical Provider, MD      VITAL SIGNS:  Blood pressure 136/94, pulse 98, temperature 98.9 F (37.2 C), temperature source Core (Comment), resp. rate 22, height 5\' 5"  (1.651 m), weight 108.863 kg (240 lb), SpO2 95 %.  PHYSICAL EXAMINATION:  GENERAL:  82 y.o.-year-old patient lying in the bed with no acute distress. Morbidly obese EYES: Pupils equal,  round, reactive to light and accommodation. No scleral icterus. Extraocular muscles intact.  HEENT: Head atraumatic, normocephalic. Oropharynx and nasopharynx clear.  NECK:  Supple, no jugular venous distention. No thyroid enlargement, no tenderness.  LUNGS: Decreased breath sounds bilaterally, no wheezing, rales,rhonchi or crepitation. No use of accessory muscles of respiration.  CARDIOVASCULAR: S1, S2 normal. No murmurs, rubs, or gallops.  ABDOMEN: Soft, nontender, nondistended. Bowel sounds present. No organomegaly or mass.  EXTREMITIES: ++ pedal edema, no cyanosis, or clubbing.  NEUROLOGIC: Cranial nerves II through XII are intact. Muscle strength 4/5 in all extremities. Sensation intact. Gait not checked. Overall very weak and deconditioned PSYCHIATRIC: The patient is alert but somewhat sleepy SKIN: No obvious rash, lesion, or ulcer. Dry skin and both lower extremities.  LABORATORY PANEL:   CBC  Recent Labs Lab 12/31/15 1039  WBC 4.6  HGB 9.3*  HCT 30.6*  PLT 246   ------------------------------------------------------------------------------------------------------------------  Chemistries   Recent Labs Lab 12/31/15 1039  NA 140  K 5.5*  CL 106  CO2 27  GLUCOSE 130*  BUN 55*  CREATININE 2.14*  CALCIUM 8.6*  AST 32  ALT 37  ALKPHOS 150*  BILITOT 1.4*   ------------------------------------------------------------------------------------------------------------------  Cardiac Enzymes  Recent Labs Lab 12/31/15 1039  TROPONINI <0.03   ------------------------------------------------------------------------------------------------------------------  RADIOLOGY:  Ct Head Wo Contrast  12/30/2015  CLINICAL DATA:  Tripped and fell over a rug at home, hitting head. Visual and auditory hallucinations. Lethargy. Pain. Initial encounter. EXAM: CT HEAD WITHOUT CONTRAST TECHNIQUE: Contiguous axial images were obtained from the base of the skull through the vertex without  intravenous contrast. COMPARISON:  06/09/2015 FINDINGS: Mild cerebral atrophy is unchanged. Patchy cerebral white matter hypodensities are unchanged and compatible with moderate chronic small vessel ischemic disease. A chronic lacunar infarct in the right caudate head is unchanged. There is no evidence of acute cortical infarct, intracranial hemorrhage, mass, midline shift, or extra-axial fluid collection. Prior bilateral cataract extraction is noted. There are large bilateral mastoid effusions, new on the right. The paranasal sinuses are clear. Calcified atherosclerosis is noted at the skullbase. No skull fracture is identified. IMPRESSION: 1. No evidence of acute intracranial abnormality. 2. Unchanged cerebral atrophy and chronic small  vessel ischemic disease. 3. Large bilateral mastoid effusions. Electronically Signed   By: Sebastian Ache M.D.   On: 12/30/2015 17:30   Dg Chest Port 1 View  12/31/2015  CLINICAL DATA:  Respiratory distress EXAM: PORTABLE CHEST 1 VIEW COMPARISON:  09/08/2015 FINDINGS: Cardiomegaly is noted. Study is limited by patient's large body habitus. No gross infiltrate or pulmonary edema. Bilateral basilar atelectasis. IMPRESSION: Limited study by patient's large body habitus shows. Cardiomegaly. Bilateral basilar atelectasis. No gross infiltrate or pulmonary edema. Electronically Signed   By: Natasha Mead M.D.   On: 12/31/2015 10:42   Dg Knee Complete 4 Views Left  12/30/2015  CLINICAL DATA:  Left knee pain after falling today. Lethargy with hallucinations. EXAM: LEFT KNEE - COMPLETE 4+ VIEW COMPARISON:  None. FINDINGS: The bones are mildly demineralized. There is no evidence of acute fracture or dislocation. Tricompartmental degenerative changes are most advanced in the lateral compartment. There is meniscal chondrocalcinosis and a small joint effusion. IMPRESSION: No acute osseous findings demonstrated. Tricompartmental degenerative changes with meniscal chondrocalcinosis and small joint  effusion. Electronically Signed   By: Carey Bullocks M.D.   On: 12/30/2015 17:58    EKG:   Chronic A. fib IMPRESSION AND PLAN:   Ilianna Bown  is a 82 y.o. female with a known history ofCOPD, chronic respiratory failure on 2 L nasal canal oxygen, morbid obesity, chronic atrial fibrillation off anticoagulation secondary to history of GI bleed, type 2 diabetes, hyperlipidemia comes to the emergency room accompanied by her husband complaining of increasing shortness of breath very hard to get around at home and found to have hypoxia on her 2 L nasal cannula oxygen. Her sats dropped down in the upper 80s.   1. Acute on chronic hypoxic respiratory failure appears to be due to congestive heart failure and mild COPD exacerbation -Patient had echo done in July 2016 showed EF of 55% with moderate MR and TR -Her BNP is elevated 1096 -We'll give her Lasix IV 20  mg twice a day , monitor I's and O's monitor creatinine and given history of chronic kidney disease stage IV  -avoid nephrotoxins -Consider nephrology consultation  2. Chronic respiratory failure secondary to COPD -On 2 L nasal canal oxygen. Patient's sats dropped to 87% -Continue nebulizer, oral inhalers  3. Hypertension continue home meds  4. Type 2 diabetes sliding scale insulin and home dose insulin  5 ongoing outpatient treatment for UTI will continue Keflex  6. Hyperlipidemia continue statins  7. Hypokalemia -Potassium is coming down down to 5.2 was originally 5.7 on May 17 Hold off on by mouth potassium  8. DVT prophylaxis SCD teds -Patient recently was evaluated for GI bleeding hence I will not give any adequate agents other than baby aspirin  9 discharge planning care manager social worker along with physical therapy to assess -Given multitude of problems patient will benefit from rehabilitation  All the records are reviewed and case discussed with ED provider. Management plans discussed with the patient, family  and they are in agreement.  CODE STATUS: Full  TOTAL TIME TAKING CARE OF THIS PATIENT: 50* minutes.    Keller Bounds M.D on 12/31/2015 at 3:00 PM  Between 7am to 6pm - Pager - [phone]  After 6pm go to [email or web address] - password [email or web address] [facility]  [place] [facility]  Office  [phone]  CC: Primary care physician; Mickey Farber, MD

## 2015-12-31 NOTE — Progress Notes (Signed)
Pt. admitted to unit, rm251 from ED, report from ConverseLauryn, Charity fundraiserN. Oriented to room, call bell, Ascom phones and staff. Bed in low position, yellow non-skid socks in place, bed alarm on. Full assessment to Epic; skin assessed with Leighton ParodyKathy S., RN. Telemetry box verified with tele clerk and Leighton ParodyKathy S., RN: [phone] . Pt is confused to place, time and situation, unable to complete admission at this time. Will attempt to complete when husband is here. Will continue to monitor.

## 2016-01-01 LAB — GLUCOSE, CAPILLARY
GLUCOSE-CAPILLARY: 181 mg/dL — AB (ref 65–99)
Glucose-Capillary: 204 mg/dL — ABNORMAL HIGH (ref 65–99)
Glucose-Capillary: 240 mg/dL — ABNORMAL HIGH (ref 65–99)
Glucose-Capillary: 241 mg/dL — ABNORMAL HIGH (ref 65–99)
Glucose-Capillary: 240 mg/dL — ABNORMAL HIGH (ref 65–99)

## 2016-01-01 LAB — BASIC METABOLIC PANEL
Anion gap: 8 (ref 5–15)
BUN: 57 mg/dL — AB (ref 6–20)
CHLORIDE: 106 mmol/L (ref 101–111)
CO2: 26 mmol/L (ref 22–32)
CREATININE: 2.29 mg/dL — AB (ref 0.44–1.00)
Calcium: 8.5 mg/dL — ABNORMAL LOW (ref 8.9–10.3)
GFR calc Af Amer: 22 mL/min — ABNORMAL LOW (ref >60)
GFR calc non Af Amer: 19 mL/min — ABNORMAL LOW (ref >60)
Glucose, Bld: 222 mg/dL — ABNORMAL HIGH (ref 65–99)
Potassium: 5.3 mmol/L — ABNORMAL HIGH (ref 3.5–5.1)
SODIUM: 140 mmol/L (ref 135–145)

## 2016-01-01 LAB — URINE CULTURE: CULTURE: NO GROWTH

## 2016-01-01 MED ORDER — METHYLPREDNISOLONE SODIUM SUCC 125 MG IJ SOLR
60.0000 mg | INTRAMUSCULAR | Status: DC
  Administered 2016-01-01 – 2016-01-02 (×2): 60 mg via INTRAVENOUS
  Filled 2016-01-01 (×2): qty 2

## 2016-01-01 MED ORDER — INSULIN ASPART 100 UNIT/ML ~~LOC~~ SOLN
0.0000 [IU] | Freq: Every day | SUBCUTANEOUS | Status: DC
  Administered 2016-01-01: 2 [IU] via SUBCUTANEOUS
  Administered 2016-01-02: 5 [IU] via SUBCUTANEOUS
  Administered 2016-01-03: 3 [IU] via SUBCUTANEOUS
  Administered 2016-01-04: 5 [IU] via SUBCUTANEOUS
  Administered 2016-01-05: 4 [IU] via SUBCUTANEOUS
  Filled 2016-01-01: qty 3
  Filled 2016-01-01: qty 5
  Filled 2016-01-01: qty 4
  Filled 2016-01-01: qty 2
  Filled 2016-01-01: qty 5

## 2016-01-01 MED ORDER — INSULIN ASPART 100 UNIT/ML ~~LOC~~ SOLN
0.0000 [IU] | Freq: Three times a day (TID) | SUBCUTANEOUS | Status: DC
  Administered 2016-01-01 (×2): 5 [IU] via SUBCUTANEOUS
  Administered 2016-01-02: 3 [IU] via SUBCUTANEOUS
  Administered 2016-01-02: 5 [IU] via SUBCUTANEOUS
  Administered 2016-01-02: 8 [IU] via SUBCUTANEOUS
  Administered 2016-01-03 (×2): 3 [IU] via SUBCUTANEOUS
  Administered 2016-01-03: 5 [IU] via SUBCUTANEOUS
  Administered 2016-01-04: 3 [IU] via SUBCUTANEOUS
  Administered 2016-01-04: 5 [IU] via SUBCUTANEOUS
  Administered 2016-01-04: 15 [IU] via SUBCUTANEOUS
  Administered 2016-01-05: 5 [IU] via SUBCUTANEOUS
  Administered 2016-01-05: 12 [IU] via SUBCUTANEOUS
  Administered 2016-01-05: 3 [IU] via SUBCUTANEOUS
  Administered 2016-01-06: 1 [IU] via SUBCUTANEOUS
  Administered 2016-01-06: 5 [IU] via SUBCUTANEOUS
  Filled 2016-01-01: qty 5
  Filled 2016-01-01: qty 1
  Filled 2016-01-01: qty 3
  Filled 2016-01-01: qty 8
  Filled 2016-01-01 (×2): qty 3
  Filled 2016-01-01: qty 8
  Filled 2016-01-01 (×2): qty 3
  Filled 2016-01-01 (×4): qty 5
  Filled 2016-01-01: qty 1
  Filled 2016-01-01: qty 15
  Filled 2016-01-01: qty 5

## 2016-01-01 MED ORDER — CEPHALEXIN 500 MG PO CAPS
500.0000 mg | ORAL_CAPSULE | Freq: Two times a day (BID) | ORAL | Status: DC
  Administered 2016-01-01 – 2016-01-02 (×2): 500 mg via ORAL
  Filled 2016-01-01 (×2): qty 1

## 2016-01-01 MED ORDER — SODIUM POLYSTYRENE SULFONATE 15 GM/60ML PO SUSP
30.0000 g | Freq: Once | ORAL | Status: AC
  Administered 2016-01-01: 30 g via ORAL
  Filled 2016-01-01: qty 120

## 2016-01-01 NOTE — Progress Notes (Signed)
Initial Heart Failure Clinic appointment scheduled for January 14, 2016 at 11:00am. Thank you.

## 2016-01-01 NOTE — Progress Notes (Signed)
PT Contraindication Note  Patient Details Name: Allison Shepard MRN: [id number] DOB: 09/13/1933   Cancelled Treatment:    Reason Eval/Treat Not Completed: Medical issues which prohibited therapy. Chart reviewed. K+=5.3 although trending down from yesterday. HR is also low according to last reading. Pt currently not appropriate for PT evaluation. Will continue to follow and attempt PT evaluation when appropriate.  Sharalyn InkJason D Lio Wehrly PT, DPT   Reveca Desmarais 01/01/2016, 8:47 AM

## 2016-01-01 NOTE — Progress Notes (Signed)
[facility] - [place] at [facility]   PATIENT NAME: Allison Shepard    MR#:  [id number]  DATE OF BIRTH:  [birth year]  SUBJECTIVE:  CHIEF COMPLAINT:   Chief Complaint  Patient presents with  . Respiratory Distress    REVIEW OF SYSTEMS:    ROS  DRUG ALLERGIES:   Allergies  Allergen Reactions  . Hydrocodone-Chlorpheniramine Other (See Comments)    Confusion  . Hydromorphone Hcl Nausea And Vomiting  . Ibuprofen Other (See Comments)    Pt was told by her MD not to take this medication.    . Mucinex [Guaifenesin Er] Other (See Comments)    Red all over. Felt like i was on fire  . Percocet [Oxycodone-Acetaminophen] Hives    VITALS:  Blood pressure 104/63, pulse 103, temperature 97.6 F (36.4 C), temperature source Oral, resp. rate 21, height 5\' 2"  (1.575 m), weight 119.523 kg (263 lb 8 oz), SpO2 91 %.  PHYSICAL EXAMINATION:   Physical Exam  GENERAL:  82 y.o.-year-old patient lying in the bed with no acute distress.  EYES: Pupils equal, round, reactive to light and accommodation. No scleral icterus. Extraocular muscles intact.  HEENT: Head atraumatic, normocephalic. Oropharynx and nasopharynx clear.  NECK:  Supple, no jugular venous distention. No thyroid enlargement, no tenderness.  LUNGS: Normal breath sounds bilaterally, no wheezing, rales, rhonchi. No use of accessory muscles of respiration.  CARDIOVASCULAR: S1, S2 normal. No murmurs, rubs, or gallops.  ABDOMEN: Soft, nontender, nondistended. Bowel sounds present. No organomegaly or mass.  EXTREMITIES: No cyanosis, clubbing or edema b/l.    NEUROLOGIC: Cranial nerves II through XII are intact. No focal Motor or sensory deficits b/l.   PSYCHIATRIC: The patient is alert and oriented x 3.  SKIN: No obvious rash, lesion, or ulcer.   LABORATORY PANEL:   CBC  Recent Labs Lab 12/31/15 1039  WBC 4.6  HGB 9.3*  HCT 30.6*  PLT 246    ------------------------------------------------------------------------------------------------------------------ Chemistries   Recent Labs Lab 12/31/15 1039 01/01/16 0418  NA 140 140  K 5.5* 5.3*  CL 106 106  CO2 27 26  GLUCOSE 130* 222*  BUN 55* 57*  CREATININE 2.14* 2.29*  CALCIUM 8.6* 8.5*  AST 32  --   ALT 37  --   ALKPHOS 150*  --   BILITOT 1.4*  --    ------------------------------------------------------------------------------------------------------------------  Cardiac Enzymes  Recent Labs Lab 12/31/15 1039  TROPONINI <0.03   ------------------------------------------------------------------------------------------------------------------  RADIOLOGY:  Ct Head Wo Contrast  12/30/2015  CLINICAL DATA:  Tripped and fell over a rug at home, hitting head. Visual and auditory hallucinations. Lethargy. Pain. Initial encounter. EXAM: CT HEAD WITHOUT CONTRAST TECHNIQUE: Contiguous axial images were obtained from the base of the skull through the vertex without intravenous contrast. COMPARISON:  06/09/2015 FINDINGS: Mild cerebral atrophy is unchanged. Patchy cerebral white matter hypodensities are unchanged and compatible with moderate chronic small vessel ischemic disease. A chronic lacunar infarct in the right caudate head is unchanged. There is no evidence of acute cortical infarct, intracranial hemorrhage, mass, midline shift, or extra-axial fluid collection. Prior bilateral cataract extraction is noted. There are large bilateral mastoid effusions, new on the right. The paranasal sinuses are clear. Calcified atherosclerosis is noted at the skullbase. No skull fracture is identified. IMPRESSION: 1. No evidence of acute intracranial abnormality. 2. Unchanged cerebral atrophy and chronic small vessel ischemic disease. 3. Large bilateral mastoid effusions. Electronically Signed   By: Sebastian AcheAllen  Grady M.D.   On: 12/30/2015 17:30   Dg Chest  Port 1 View  12/31/2015  CLINICAL DATA:   Respiratory distress EXAM: PORTABLE CHEST 1 VIEW COMPARISON:  09/08/2015 FINDINGS: Cardiomegaly is noted. Study is limited by patient's large body habitus. No gross infiltrate or pulmonary edema. Bilateral basilar atelectasis. IMPRESSION: Limited study by patient's large body habitus shows. Cardiomegaly. Bilateral basilar atelectasis. No gross infiltrate or pulmonary edema. Electronically Signed   By: Natasha Mead M.D.   On: 12/31/2015 10:42   Dg Knee Complete 4 Views Left  12/30/2015  CLINICAL DATA:  Left knee pain after falling today. Lethargy with hallucinations. EXAM: LEFT KNEE - COMPLETE 4+ VIEW COMPARISON:  None. FINDINGS: The bones are mildly demineralized. There is no evidence of acute fracture or dislocation. Tricompartmental degenerative changes are most advanced in the lateral compartment. There is meniscal chondrocalcinosis and a small joint effusion. IMPRESSION: No acute osseous findings demonstrated. Tricompartmental degenerative changes with meniscal chondrocalcinosis and small joint effusion. Electronically Signed   By: Carey Bullocks M.D.   On: 12/30/2015 17:58     ASSESSMENT AND PLAN:   Tkai Large is a 82 y.o. female with a known history ofCOPD, chronic respiratory failure on 2 L nasal canal oxygen, morbid obesity, chronic atrial fibrillation off anticoagulation secondary to history of GI bleed, type 2 diabetes, hyperlipidemia admitted for SOB  1. Acute on chronic hypoxic respiratory failure appears to be due to congestive heart failure and COPD exacerbation -  EF of 55% with moderate MR and TR -- IV Lasix, Beta blockers - Input and Output - Counseled to limit fluids and Salt - Monitor Bun/Cr and Potassium -Cardiology follow up after discharge  2. COPD exacerbation -IV steroids, Antibiotics - Scheduled Nebulizers - Inhalers -Wean O2 as tolerated - Consult pulmonary if no improvement  * ARF over CKD3 Likely cardiorenal syndrome. Monitor while on diuresis.  3.  Hypertension continue home meds  4. Type 2 diabetes sliding scale insulin and home dose insulin  5 Ongoing outpatient treatment for UTI will continue Keflex  6. Hyperlipidemia continue statins  7. Hyperkalemia -Potassium is coming down down to 5.3 was originally 5.7 on May 17 Hold off on by mouth potassium  8. DVT prophylaxis SCD teds Recent evaluation for GI bleed.  9. physical therapy to see. May benefit from short-term rehabilitation.  All the records are reviewed and case discussed with Care Management/Social Workerr. Management plans discussed with the patient, family and they are in agreement.  CODE STATUS: FULL CODE  DVT Prophylaxis: SCDs  TOTAL TIME TAKING CARE OF THIS PATIENT: 30 minutes.   POSSIBLE D/C IN 1- 2 DAYS, DEPENDING ON CLINICAL CONDITION.  Milagros Loll R M.D on 01/01/2016 at 12:17 PM  Between 7am to 6pm - Pager - [phone]  After 6pm go to [email or web address] - password [email or web address]  [place] [facility]  Office  [phone]  CC: Primary care physician; Mickey Farber, MD  Note: This dictation was prepared with Dragon dictation along with smaller phrase technology. Any transcriptional errors that result from this process are unintentional.

## 2016-01-01 NOTE — Consult Note (Signed)
Reason for Consult: Congestive heart failure and leg edema altered mental status Referring Physician: Fritzi Mandes hospitalist primary physician Dr. Rachelle Hora Allison Shepard is an 82 y.o. female.  HPI: Patient presents with altered mental status was totally failure and known COPD hypoxemia on 2 L home O2 morbid obesity atrial fibrillation GI bleeding diabetes hyperlipidemia presented to emergency room with a complaint of progressive shortness of breath at home found to have severe hypoxemia he is on 2 L oxygen and required admission to the emergency room. Patient had shortness of breath and leg swelling over the last 2 weeks recently treated for UTI and Keflex admitted for congestive heart failure BNP was elevated with bilateral edema.) She been more lethargic and confused for mental status.  Past Medical History  Diagnosis Date  . COPD (chronic obstructive pulmonary disease) ([place])   . Atrial fibrillation ([place])   . Hypertension   . Hypercholesteremia   . Pneumonia   . Diabetes mellitus without complication ([place])     type 2  . Headache     migraines - years ago  . Shortness of breath dyspnea   . Asthma   . Dysrhythmia     afib  . Chronic kidney disease     history of insufficiency  . Arthritis   . Spinal disorder     stenosis  . Complication of anesthesia     difficulty to wake up after an 8 hour back surgery  . Edema     legs/feet  . Wheezing     Past Surgical History  Procedure Laterality Date  . Abdominal hysterectomy    . Shoulder fusion surgery Right   . Total knee arthroplasty Right     knee replacement  . Total hip arthroplasty Right     hip replacement  . Back surgery    . Cataract extraction      cataract surgery ? eye  . Fracture surgery Right     right arm 20 years ago  . Colonoscopy      x 3  . Orif distal radius fracture Left 06/17/2015  . Open reduction internal fixation (orif) distal radial fracture Left 06/17/2015    Procedure: OPEN REDUCTION INTERNAL  FIXATION LEFT  DISTAL RADIUS AND ULNA FRACTURES;  Surgeon: Leandrew Koyanagi, MD;  Location: [facility];  Service: Orthopedics;  Laterality: Left;  . Joint replacement    . Hernia repair    . Cataract extraction w/phaco Left 07/28/2015    Procedure: CATARACT EXTRACTION PHACO AND INTRAOCULAR LENS PLACEMENT (IOC);  Surgeon: Birder Robson, MD;  Location: [facility];  Service: Ophthalmology;  Laterality: Left;  [place] 2:16   . Esophagogastroduodenoscopy (egd) with propofol N/A 12/24/2015    Procedure: ESOPHAGOGASTRODUODENOSCOPY (EGD) WITH PROPOFOL;  Surgeon: Milus Banister, MD;  Location: [facility];  Service: Endoscopy;  Laterality: N/A;    Family History  Problem Relation Age of Onset  . Heart disease Father     Social History:  reports that she quit smoking about 23 years ago. She has never used smokeless tobacco. She reports that she drinks about 8.4 oz of alcohol per week. She reports that she does not use illicit drugs.  Allergies:  Allergies  Allergen Reactions  . Hydrocodone-Chlorpheniramine Other (See Comments)    Confusion  . Hydromorphone Hcl Nausea And Vomiting  . Ibuprofen Other (See Comments)    Pt was told by her MD not to take this medication.    Marland Kitchen Mucinex [Guaifenesin Er] Other (See Comments)  Red all over. Felt like i was on fire  . Percocet [Oxycodone-Acetaminophen] Hives    Medications: I have reviewed the patient's current medications.  Results for orders placed or performed during the hospital encounter of 12/31/15 (from the past 48 hour(s))  Glucose, capillary     Status: Abnormal   Collection Time: 12/31/15 10:20 AM  Result Value Ref Range   Glucose-Capillary 133 (H) 65 - 99 mg/dL  Lactic acid, plasma     Status: None   Collection Time: 12/31/15 10:39 AM  Result Value Ref Range   Lactic Acid, Venous 1.2 0.5 - 2.0 mmol/L  Comprehensive metabolic panel     Status: Abnormal   Collection Time: 12/31/15 10:39 AM  Result Value Ref Range   Sodium 140 135 - 145 mmol/L    Potassium 5.5 (H) 3.5 - 5.1 mmol/L   Chloride 106 101 - 111 mmol/L   CO2 27 22 - 32 mmol/L   Glucose, Bld 130 (H) 65 - 99 mg/dL   BUN 55 (H) 6 - 20 mg/dL   Creatinine, Ser 2.14 (H) 0.44 - 1.00 mg/dL   Calcium 8.6 (L) 8.9 - 10.3 mg/dL   Total Protein 7.0 6.5 - 8.1 g/dL   Albumin 3.5 3.5 - 5.0 g/dL   AST 32 15 - 41 U/L   ALT 37 14 - 54 U/L   Alkaline Phosphatase 150 (H) 38 - 126 U/L   Total Bilirubin 1.4 (H) 0.3 - 1.2 mg/dL   GFR calc non Af Amer 21 (L) >60 mL/min   GFR calc Af Amer 24 (L) >60 mL/min    Comment: (NOTE) The eGFR has been calculated using the CKD EPI equation. This calculation has not been validated in all clinical situations. eGFR's persistently <60 mL/min signify possible Chronic Kidney Disease.    Anion gap 7 5 - 15  CBC WITH DIFFERENTIAL     Status: Abnormal   Collection Time: 12/31/15 10:39 AM  Result Value Ref Range   WBC 4.6 3.6 - 11.0 K/uL   RBC 3.70 (L) 3.80 - 5.20 MIL/uL   Hemoglobin 9.3 (L) 12.0 - 16.0 g/dL   HCT 30.6 (L) 35.0 - 47.0 %   MCV 82.7 80.0 - 100.0 fL   MCH 25.0 (L) 26.0 - 34.0 pg   MCHC 30.3 (L) 32.0 - 36.0 g/dL   RDW 21.0 (H) 11.5 - 14.5 %   Platelets 246 150 - 440 K/uL   Neutrophils Relative % 78 %   Neutro Abs 3.6 1.4 - 6.5 K/uL   Lymphocytes Relative 8 %   Lymphs Abs 0.4 (L) 1.0 - 3.6 K/uL   Monocytes Relative 12 %   Monocytes Absolute 0.6 0.2 - 0.9 K/uL   Eosinophils Relative 1 %   Eosinophils Absolute 0.1 0 - 0.7 K/uL   Basophils Relative 1 %   Basophils Absolute 0.0 0 - 0.1 K/uL  Blood Culture (routine x 2)     Status: None (Preliminary result)   Collection Time: 12/31/15 10:39 AM  Result Value Ref Range   Specimen Description BLOOD LEFT AC    Special Requests      BOTTLES DRAWN AEROBIC AND ANAEROBIC AER 4ML ANA 3ML   Culture NO GROWTH < 12 HOURS    Report Status PENDING   Blood Culture (routine x 2)     Status: None (Preliminary result)   Collection Time: 12/31/15 10:39 AM  Result Value Ref Range   Specimen  Description BLOOD LEFT HAND    Special Requests BOTTLES  DRAWN AEROBIC AND ANAEROBIC .5ML    Culture NO GROWTH < 12 HOURS    Report Status PENDING   Urine culture     Status: None   Collection Time: 12/31/15 10:39 AM  Result Value Ref Range   Specimen Description URINE, RANDOM    Special Requests NONE    Culture NO GROWTH Performed at [facility]     Report Status 01/01/2016 FINAL   Urinalysis complete, with microscopic ([facility] only)     Status: Abnormal   Collection Time: 12/31/15 10:39 AM  Result Value Ref Range   Color, Urine YELLOW (A) YELLOW   APPearance CLEAR (A) CLEAR   Glucose, UA NEGATIVE NEGATIVE mg/dL   Bilirubin Urine NEGATIVE NEGATIVE   Ketones, ur NEGATIVE NEGATIVE mg/dL   Specific Gravity, Urine 1.009 1.005 - 1.030   Hgb urine dipstick NEGATIVE NEGATIVE   pH 5.0 5.0 - 8.0   Protein, ur NEGATIVE NEGATIVE mg/dL   Nitrite NEGATIVE NEGATIVE   Leukocytes, UA NEGATIVE NEGATIVE   RBC / HPF 0-5 0 - 5 RBC/hpf   WBC, UA NONE SEEN 0 - 5 WBC/hpf   Bacteria, UA NONE SEEN NONE SEEN   Squamous Epithelial / LPF 0-5 (A) NONE SEEN   Mucous PRESENT    Hyaline Casts, UA PRESENT   Troponin I     Status: None   Collection Time: 12/31/15 10:39 AM  Result Value Ref Range   Troponin I <0.03 <0.031 ng/mL    Comment:        NO INDICATION OF MYOCARDIAL INJURY.   Brain natriuretic peptide     Status: Abnormal   Collection Time: 12/31/15 10:39 AM  Result Value Ref Range   B Natriuretic Peptide 1094.0 (H) 0.0 - 100.0 pg/mL  Blood gas, arterial     Status: Abnormal   Collection Time: 12/31/15 11:20 AM  Result Value Ref Range   FIO2 0.36    pH, Arterial 7.38 7.350 - 7.450   pCO2 arterial 44 32.0 - 48.0 mmHg   pO2, Arterial 76 (L) 83.0 - 108.0 mmHg   Bicarbonate 26.0 21.0 - 28.0 mEq/L   Acid-Base Excess 0.7 0.0 - 3.0 mmol/L   O2 Saturation 94.8 %   Patient temperature 37.0    Collection site RIGHT RADIAL    Sample type ARTERIAL DRAW    Allens test (pass/fail) POSITIVE (A)  PASS  Basic metabolic panel     Status: Abnormal   Collection Time: 01/01/16  4:18 AM  Result Value Ref Range   Sodium 140 135 - 145 mmol/L   Potassium 5.3 (H) 3.5 - 5.1 mmol/L   Chloride 106 101 - 111 mmol/L   CO2 26 22 - 32 mmol/L   Glucose, Bld 222 (H) 65 - 99 mg/dL   BUN 57 (H) 6 - 20 mg/dL   Creatinine, Ser 2.29 (H) 0.44 - 1.00 mg/dL   Calcium 8.5 (L) 8.9 - 10.3 mg/dL   GFR calc non Af Amer 19 (L) >60 mL/min   GFR calc Af Amer 22 (L) >60 mL/min    Comment: (NOTE) The eGFR has been calculated using the CKD EPI equation. This calculation has not been validated in all clinical situations. eGFR's persistently <60 mL/min signify possible Chronic Kidney Disease.    Anion gap 8 5 - 15  Glucose, capillary     Status: Abnormal   Collection Time: 01/01/16  8:03 AM  Result Value Ref Range   Glucose-Capillary 181 (H) 65 - 99 mg/dL  Glucose, capillary  Status: Abnormal   Collection Time: 01/01/16 11:46 AM  Result Value Ref Range   Glucose-Capillary 204 (H) 65 - 99 mg/dL   Comment 1 Notify RN     Ct Head Wo Contrast  12/30/2015  CLINICAL DATA:  Tripped and fell over a rug at home, hitting head. Visual and auditory hallucinations. Lethargy. Pain. Initial encounter. EXAM: CT HEAD WITHOUT CONTRAST TECHNIQUE: Contiguous axial images were obtained from the base of the skull through the vertex without intravenous contrast. COMPARISON:  06/09/2015 FINDINGS: Mild cerebral atrophy is unchanged. Patchy cerebral white matter hypodensities are unchanged and compatible with moderate chronic small vessel ischemic disease. A chronic lacunar infarct in the right caudate head is unchanged. There is no evidence of acute cortical infarct, intracranial hemorrhage, mass, midline shift, or extra-axial fluid collection. Prior bilateral cataract extraction is noted. There are large bilateral mastoid effusions, new on the right. The paranasal sinuses are clear. Calcified atherosclerosis is noted at the skullbase.  No skull fracture is identified. IMPRESSION: 1. No evidence of acute intracranial abnormality. 2. Unchanged cerebral atrophy and chronic small vessel ischemic disease. 3. Large bilateral mastoid effusions. Electronically Signed   By: Logan Bores M.D.   On: 12/30/2015 17:30   Dg Chest Port 1 View  12/31/2015  CLINICAL DATA:  Respiratory distress EXAM: PORTABLE CHEST 1 VIEW COMPARISON:  09/08/2015 FINDINGS: Cardiomegaly is noted. Study is limited by patient's large body habitus. No gross infiltrate or pulmonary edema. Bilateral basilar atelectasis. IMPRESSION: Limited study by patient's large body habitus shows. Cardiomegaly. Bilateral basilar atelectasis. No gross infiltrate or pulmonary edema. Electronically Signed   By: Lahoma Crocker M.D.   On: 12/31/2015 10:42   Dg Knee Complete 4 Views Left  12/30/2015  CLINICAL DATA:  Left knee pain after falling today. Lethargy with hallucinations. EXAM: LEFT KNEE - COMPLETE 4+ VIEW COMPARISON:  None. FINDINGS: The bones are mildly demineralized. There is no evidence of acute fracture or dislocation. Tricompartmental degenerative changes are most advanced in the lateral compartment. There is meniscal chondrocalcinosis and a small joint effusion. IMPRESSION: No acute osseous findings demonstrated. Tricompartmental degenerative changes with meniscal chondrocalcinosis and small joint effusion. Electronically Signed   By: Richardean Sale M.D.   On: 12/30/2015 17:58    Review of Systems  Unable to perform ROS  Blood pressure 104/63, pulse 103, temperature 97.6 F (36.4 C), temperature source Oral, resp. rate 21, height 5' 2"  (1.575 m), weight 119.523 kg (263 lb 8 oz), SpO2 91 %. Physical Exam  Nursing note and vitals reviewed. Constitutional: She appears well-developed and well-nourished. She appears listless.  HENT:  Head: Normocephalic and atraumatic.  Eyes: Conjunctivae and EOM are normal. Pupils are equal, round, and reactive to light.  Neck: Normal range of  motion. Neck supple.  Cardiovascular: S1 normal, S2 normal and normal pulses.  An irregularly irregular rhythm present. Tachycardia present.   Murmur heard.  Systolic murmur is present with a grade of 2/6  Respiratory: She has wheezes. She has rales.  GI: Soft.  Musculoskeletal: Normal range of motion. She exhibits edema.  Neurological: She appears listless. She is disoriented.  Skin: Skin is warm and dry.  Psychiatric: She has a normal mood and affect.    Assessment/Plan: Congestive heart failure Shortness of breath COPD Altered mental status Atrial fibrillation Hyperlipidemia Hypertension Diabetes type 2 uncomplicated Asthma Chronic renal insufficiency Obesity Pneumonia Recent urinary tract infection . PLAN Agree with admission to telemetry Recommend rate control for atrial fibrillation Consider broad-spectrum antibiotics for possible sepsis Consider  nephrology input for altered mental status Diuretic therapy for congestive heart failure and edema Proceed with inhalers for COPD symptoms Continue current diabetes management Follow-up with nephrology for renal insufficiency Recommend weight loss moderate exercise for obesity portion control  Timothea Bodenheimer D. 01/01/2016, 1:08 PM

## 2016-01-01 NOTE — Progress Notes (Signed)
Per Dr. Elpidio AnisSudini okay to discontinue the insulin 8-12 units with meals.

## 2016-01-01 NOTE — Clinical Documentation Improvement (Signed)
Internal Medicine Please update your documentation within the medical record to reflect your response to this query. Thank you  Can the diagnosis of CHF be further specified?    Acuity - Acute, Chronic, Acute on Chronic   Type - Systolic, Diastolic, Systolic and Diastolic  Other  Clinically Undetermined  Document any associated diagnoses/conditions  Supporting Information: 01/01/16 progr note "...congestive heart failure and COPD exacerbation- EF of 55% with moderate MR and TR-- IV Lasix, Beta blockers- Input and Output- Counseled to limit fluids and Salt..."  Please exercise your independent, professional judgment when responding. A specific answer is not anticipated or expected.  Thank You,  Toribio Harbourphelia R Karlynn Furrow, RN, BSN, CCDS Certified Clinical Documentation Specialist [place]: Health Information Management [phone]

## 2016-01-01 NOTE — Care Management (Addendum)
Presents from home with spouse. Chronic O2. CHF and COPD exacerbation. Wheezing and shortness of breath.  PT consult pending. Patient wants to go home. She may need a nebulizer order. Discussed home health and patient refused any services.

## 2016-01-01 NOTE — Progress Notes (Signed)
CSW received a consult for discharge planning. CSW reviewed patient's chart. CSW is awaiting completing of PT evaluation to determine patient's needs at discharge. CSW will continue to follow and assist.   Woodroe Modehristina Vinny Taranto, MSW, LCSW-A Clinical Social Work Department [phone]

## 2016-01-02 ENCOUNTER — Inpatient Hospital Stay: Payer: Medicare Other

## 2016-01-02 LAB — BASIC METABOLIC PANEL
ANION GAP: 9 (ref 5–15)
BUN: 75 mg/dL — AB (ref 6–20)
CALCIUM: 8.4 mg/dL — AB (ref 8.9–10.3)
CO2: 26 mmol/L (ref 22–32)
Chloride: 103 mmol/L (ref 101–111)
Creatinine, Ser: 2.49 mg/dL — ABNORMAL HIGH (ref 0.44–1.00)
GFR calc Af Amer: 20 mL/min — ABNORMAL LOW (ref >60)
GFR, EST NON AFRICAN AMERICAN: 17 mL/min — AB (ref >60)
GLUCOSE: 198 mg/dL — AB (ref 65–99)
Potassium: 4.6 mmol/L (ref 3.5–5.1)
Sodium: 138 mmol/L (ref 135–145)

## 2016-01-02 LAB — CBC WITH DIFFERENTIAL/PLATELET
BASOS ABS: 0 10*3/uL (ref 0–0.1)
Basophils Relative: 0 %
Eosinophils Absolute: 0 10*3/uL (ref 0–0.7)
HEMATOCRIT: 28.9 % — AB (ref 35.0–47.0)
Hemoglobin: 8.7 g/dL — ABNORMAL LOW (ref 12.0–16.0)
LYMPHS ABS: 0.3 10*3/uL — AB (ref 1.0–3.6)
MCH: 24.9 pg — AB (ref 26.0–34.0)
MCHC: 30 g/dL — ABNORMAL LOW (ref 32.0–36.0)
MCV: 83 fL (ref 80.0–100.0)
MONO ABS: 0.4 10*3/uL (ref 0.2–0.9)
NEUTROS ABS: 5.7 10*3/uL (ref 1.4–6.5)
Neutrophils Relative %: 90 %
PLATELETS: 253 10*3/uL (ref 150–440)
RBC: 3.48 MIL/uL — ABNORMAL LOW (ref 3.80–5.20)
RDW: 21 % — AB (ref 11.5–14.5)
WBC: 6.3 10*3/uL (ref 3.6–11.0)

## 2016-01-02 LAB — GLUCOSE, CAPILLARY
GLUCOSE-CAPILLARY: 292 mg/dL — AB (ref 65–99)
GLUCOSE-CAPILLARY: 362 mg/dL — AB (ref 65–99)
Glucose-Capillary: 196 mg/dL — ABNORMAL HIGH (ref 65–99)
Glucose-Capillary: 209 mg/dL — ABNORMAL HIGH (ref 65–99)

## 2016-01-02 MED ORDER — GUAIFENESIN 100 MG/5ML PO SOLN
5.0000 mL | ORAL | Status: DC | PRN
  Administered 2016-01-02 – 2016-01-04 (×3): 100 mg via ORAL
  Filled 2016-01-02 (×3): qty 10

## 2016-01-02 MED ORDER — PREDNISONE 50 MG PO TABS
50.0000 mg | ORAL_TABLET | Freq: Every day | ORAL | Status: DC
  Administered 2016-01-03 – 2016-01-04 (×2): 50 mg via ORAL
  Filled 2016-01-02 (×3): qty 1

## 2016-01-02 MED ORDER — AMOXICILLIN-POT CLAVULANATE 500-125 MG PO TABS
1.0000 | ORAL_TABLET | Freq: Every day | ORAL | Status: DC
  Administered 2016-01-02 – 2016-01-04 (×2): 500 mg via ORAL
  Filled 2016-01-02 (×4): qty 1

## 2016-01-02 MED ORDER — IPRATROPIUM-ALBUTEROL 0.5-2.5 (3) MG/3ML IN SOLN
3.0000 mL | RESPIRATORY_TRACT | Status: DC | PRN
  Administered 2016-01-02 (×2): 3 mL via RESPIRATORY_TRACT
  Filled 2016-01-02 (×2): qty 3

## 2016-01-02 NOTE — Progress Notes (Signed)
[facility]  ROUNDING NOTE   Subjective:   Admitted on 12/31/2015 for Hyperkalemia [E87.5] Acute respiratory failure with hypoxia ([place]) [J96.01] COPD with exacerbation ([place]) [J44.1]   Patient now with aspiration event this morning. Seems to be getting confused. +productive cough with wheezing.   Husband at bedside.   Objective:  Vital signs in last 24 hours:  Temp:  [96.7 F (35.9 C)-97.6 F (36.4 C)] 96.7 F (35.9 C) (05/20 1143) Pulse Rate:  [33-131] 102 (05/20 1144) Resp:  [16-22] 20 (05/20 1143) BP: (107-163)/(62-140) 158/96 mmHg (05/20 1158) SpO2:  [76 %-94 %] 94 % (05/20 1144)  Weight change:  Filed Weights   12/31/15 1013 12/31/15 1706  Weight: 108.863 kg (240 lb) 119.523 kg (263 lb 8 oz)    Intake/Output: I/O last 3 completed shifts: In: 480 [P.O.:480] Out: 1800 [Urine:1800]   Intake/Output this shift:  Total I/O In: 120 [P.O.:120] Out: 400 [Urine:400]  Physical Exam: General: NAD, laying in bed  Head: Normocephalic, atraumatic. Moist oral mucosal membranes  Eyes: Anicteric, PERRL  Neck: Supple, trachea midline  Lungs:  Bilateral wheezing  Heart: irregular  Abdomen:  Soft, nontender,   Extremities:  1+ peripheral edema.  Neurologic: Nonfocal, moving all four extremities  Skin: No lesions       Basic Metabolic Panel:  Recent Labs Lab 12/30/15 1308 12/30/15 2143 12/31/15 1039 01/01/16 0418 01/02/16 0657  NA 139 139 140 140 138  K 5.7* 5.4* 5.5* 5.3* 4.6  CL 105 107 106 106 103  CO2 28 26 27 26 26   GLUCOSE 97 100* 130* 222* 198*  BUN 53* 52* 55* 57* 75*  CREATININE 2.25* 2.07* 2.14* 2.29* 2.49*  CALCIUM 8.7* 8.4* 8.6* 8.5* 8.4*    Liver Function Tests:  Recent Labs Lab 12/30/15 1308 12/31/15 1039  AST 26 32  ALT 35 37  ALKPHOS 153* 150*  BILITOT 1.2 1.4*  PROT 6.7 7.0  ALBUMIN 3.6 3.5   No results for input(s): LIPASE, AMYLASE in the last 168 hours. No results for input(s): AMMONIA in the last 168  hours.  CBC:  Recent Labs Lab 12/30/15 1308 12/31/15 1039 01/02/16 0657  WBC 5.1 4.6 6.3  NEUTROABS  --  3.6 5.7  HGB 9.2* 9.3* 8.7*  HCT 30.3* 30.6* 28.9*  MCV 82.2 82.7 83.0  PLT 248 246 253    Cardiac Enzymes:  Recent Labs Lab 12/31/15 1039  TROPONINI <0.03    BNP: Invalid input(s): POCBNP  CBG:  Recent Labs Lab 01/01/16 1621 01/01/16 2123 01/01/16 2227 01/02/16 0751 01/02/16 1148  GLUCAP 240* 241* 240* 196* 209*    Microbiology: Results for orders placed or performed during the hospital encounter of 12/31/15  Blood Culture (routine x 2)     Status: None (Preliminary result)   Collection Time: 12/31/15 10:39 AM  Result Value Ref Range Status   Specimen Description BLOOD LEFT AC  Final   Special Requests   Final    BOTTLES DRAWN AEROBIC AND ANAEROBIC AER 4ML ANA 3ML   Culture NO GROWTH 1 DAY  Final   Report Status PENDING  Incomplete  Blood Culture (routine x 2)     Status: None (Preliminary result)   Collection Time: 12/31/15 10:39 AM  Result Value Ref Range Status   Specimen Description BLOOD LEFT HAND  Final   Special Requests BOTTLES DRAWN AEROBIC AND ANAEROBIC .5ML  Final   Culture NO GROWTH 1 DAY  Final   Report Status PENDING  Incomplete  Urine culture  Status: None   Collection Time: 12/31/15 10:39 AM  Result Value Ref Range Status   Specimen Description URINE, RANDOM  Final   Special Requests NONE  Final   Culture NO GROWTH Performed at [facility]   Final   Report Status 01/01/2016 FINAL  Final    Coagulation Studies: No results for input(s): LABPROT, INR in the last 72 hours.  Urinalysis:  Recent Labs  12/30/15 2143 12/31/15 1039  COLORURINE YELLOW* YELLOW*  LABSPEC 1.017 1.009  PHURINE 5.0 5.0  GLUCOSEU NEGATIVE NEGATIVE  HGBUR NEGATIVE NEGATIVE  BILIRUBINUR NEGATIVE NEGATIVE  KETONESUR NEGATIVE NEGATIVE  PROTEINUR 30* NEGATIVE  NITRITE POSITIVE* NEGATIVE  LEUKOCYTESUR TRACE* NEGATIVE      Imaging: Dg  Chest 2 View  01/02/2016  CLINICAL DATA:  82 year old female with a history of aspiration. EXAM: CHEST  2 VIEW COMPARISON:  12/31/2015 FINDINGS: Cardiomediastinal silhouette unchanged with cardiomegaly. Calcifications of the aortic arch. Dense opacity at the base of the lungs, most pronounced on the lateral view. No pneumothorax. IMPRESSION: Dense basilar opacity, compatible with aspiration/pneumonitis. Followup PA and lateral chest X-ray is recommended in 3-4 weeks following therapy to ensure resolution. Signed, Dulcy Fanny. Earleen Newport, DO [facility] Radiology Electronically Signed   By: Corrie Mckusick D.O.   On: 01/02/2016 11:55     Medications:     . amoxicillin-clavulanate  1 tablet Oral Daily  . aspirin EC  81 mg Oral Daily  . calcium carbonate  1.5 tablet Oral Daily  . cholecalciferol  1,000 Units Oral Daily  . diltiazem  180 mg Oral Daily  . furosemide  20 mg Intravenous Q12H  . insulin aspart  0-15 Units Subcutaneous TID WC  . insulin aspart  0-5 Units Subcutaneous QHS  . insulin glargine  26 Units Subcutaneous q morning - 10a  . levothyroxine  100 mcg Oral QAC breakfast  . metoprolol succinate  100 mg Oral Daily  . ondansetron  4 mg Oral BID  . pantoprazole  40 mg Oral Daily  . [START ON 01/03/2016] predniSONE  50 mg Oral Q breakfast  . simvastatin  10 mg Oral Daily   acetaminophen **OR** acetaminophen, albuterol, bisacodyl, senna-docusate, zolpidem  Assessment/ Plan:  Ms Stiner is a 82 year old white female with atrial fibrillation, hypertension, COPD, osteoarthritis, hyperlipidemia, osteopenia, allergic rhinitis, lumbar spinal stenosis presents as a follow up patient chronic kidney disease stage III with proteinuria.   1. Acute Renal Failure on Chronic Kidney Disease stage IV with proteinuria: Acute renal failure from hypoxia and acute cardio-renal syndrome.  Baseline creatinine of 1.8, eGFR of 27. 10/26/15. Chronic Kidney Disease  secondary to diabetic nephropathy and hypertensive nephrosclerosis - holding olmesartan - Renally dose all medications, avoid nephrotoxic agents  - Continue to monitor.   2. Hypertension and Edema:   - furosemide 69m IV.   3. Secondary Hyperparathyroidism: calcium at goal. PTH and phosphorus at goal.  4. Diabetes Mellitus type II with chronic kidney disease:  - Continue glucose control.   5. COPD: now with aspiration. Wheezing on examination.  - change to Duonebs    LOS: 2 Mickey Hebel 5/20/20171:31 PM

## 2016-01-02 NOTE — Progress Notes (Addendum)
[facility] Physicians - [place] at [facility]   PATIENT NAME: Allison Shepard    MR#:  [id number]  DATE OF BIRTH:  [birth year]  SUBJECTIVE:  CHIEF COMPLAINT:   Chief Complaint  Patient presents with  . Respiratory Distress   Aspirated this am. Has SOB, wheezing and cough, on O2 [place] 4L. REVIEW OF SYSTEMS:    CONSTITUTIONAL: No fever, Generalized weakness.  EYES: No blurred or double vision.  EARS, NOSE, AND THROAT: No tinnitus or ear pain.  RESPIRATORY: has cough, shortness of breath and wheezing but no hemoptysis.  CARDIOVASCULAR: No chest pain, orthopnea, edema.  GASTROINTESTINAL: No nausea, vomiting, diarrhea or abdominal pain.  GENITOURINARY: No dysuria, hematuria.  ENDOCRINE: No polyuria, nocturia,  HEMATOLOGY: No anemia, easy bruising or bleeding SKIN: No rash or lesion. MUSCULOSKELETAL: No joint pain or arthritis.  NEUROLOGIC: No tingling, numbness, weakness.  PSYCHIATRY: No anxiety or depression.   DRUG ALLERGIES:   Allergies  Allergen Reactions  . Hydrocodone-Chlorpheniramine Other (See Comments)    Confusion  . Hydromorphone Hcl Nausea And Vomiting  . Ibuprofen Other (See Comments)    Pt was told by her MD not to take this medication.    . Mucinex [Guaifenesin Er] Other (See Comments)    Red all over. Felt like i was on fire  . Percocet [Oxycodone-Acetaminophen] Hives    VITALS:  Blood pressure 158/96, pulse 102, temperature 96.7 F (35.9 C), temperature source Axillary, resp. rate 20, height  (1.575 m), weight 119.523 kg (263 lb 8 oz), SpO2 94 %.  PHYSICAL EXAMINATION:   Physical Exam  GENERAL:  82 y.o.-year-old patient lying in the bed with no acute distress. Morbid obese. EYES: Pupils equal, round, reactive to light and accommodation. No scleral icterus. Extraocular muscles intact.  HEENT: Head atraumatic, normocephalic. Oropharynx and nasopharynx clear.  NECK:  Supple, no jugular venous distention. No thyroid  enlargement, no tenderness.  LUNGS: Normal breath sounds bilaterally, has expiratory wheezing and rhonchi. No use of accessory muscles of respiration.  CARDIOVASCULAR: S1, S2 normal. No murmurs, rubs, or gallops.  ABDOMEN: Soft, nontender, nondistended. Bowel sounds present. No organomegaly or mass.  EXTREMITIES: No cyanosis, clubbing or edema b/l.    NEUROLOGIC: Cranial nerves II through XII are intact. No focal Motor or sensory deficits b/l.   PSYCHIATRIC: The patient is alert and oriented x 3.  SKIN: No obvious rash, lesion, or ulcer.   LABORATORY PANEL:   CBC  Recent Labs Lab 01/02/16 0657  WBC 6.3  HGB 8.7*  HCT 28.9*  PLT 253   ------------------------------------------------------------------------------------------------------------------ Chemistries   Recent Labs Lab 12/31/15 1039  01/02/16 0657  NA 140  < > 138  K 5.5*  < > 4.6  CL 106  < > 103  CO2 27  < > 26  GLUCOSE 130*  < > 198*  BUN 55*  < > 75*  CREATININE 2.14*  < > 2.49*  CALCIUM 8.6*  < > 8.4*  AST 32  --   --   ALT 37  --   --   ALKPHOS 150*  --   --   BILITOT 1.4*  --   --   < > = values in this interval not displayed. ------------------------------------------------------------------------------------------------------------------  Cardiac Enzymes  Recent Labs Lab 12/31/15 1039  TROPONINI <0.03   ------------------------------------------------------------------------------------------------------------------  RADIOLOGY:  Dg Chest 2 View  01/02/2016  CLINICAL DATA:  82 year old female with a history of aspiration. EXAM: CHEST  2 VIEW COMPARISON:  12/31/2015 FINDINGS: Cardiomediastinal silhouette unchanged  with cardiomegaly. Calcifications of the aortic arch. Dense opacity at the base of the lungs, most pronounced on the lateral view. No pneumothorax. IMPRESSION: Dense basilar opacity, compatible with aspiration/pneumonitis. Followup PA and lateral chest X-ray is recommended in 3-4 weeks  following therapy to ensure resolution. Signed, Yvone NeuJaime S. Loreta AveWagner, DO [facility] Electronically Signed   By: Gilmer MorJaime  Wagner D.O.   On: 01/02/2016 11:55     ASSESSMENT AND PLAN:   Allison DimitriMargaret Shepard is a 82 y.o. female with a known history ofCOPD, chronic respiratory failure on 2 L nasal canal oxygen, morbid obesity, chronic atrial fibrillation off anticoagulation secondary to history of GI bleed, type 2 diabetes, hyperlipidemia admitted for SOB  1. Acute on chronic hypoxic respiratory failure with hypoxia.  appears to be due to congestive heart failure and COPD exacerbation Try to wean down O2 [place]. NEB prn. Robitussin prn.   * Acute diastolic congestive heart failure -  EF of 55% with moderate MR and TR -- IV Lasix, Beta blockers - Input and Output - Counseled to limit fluids and Salt - Monitor Bun/Cr and Potassium -Cardiology follow up after discharge  2. COPD exacerbation Change to po prednisone, on keflex. - Scheduled Nebulizers - Inhalers -Wean O2 as tolerated - Consult pulmonary if no improvement  * Aspiration PNA. Change to augmentin.  Robitussin prn. Aspiration precaution.  * ARF on CKD3 Likely cardiorenal syndrome. Monitor while on diuresis. Nephrology consult.  3. Hypertension continue home meds  4. Type 2 diabetes sliding scale insulin and lantus 26 unit daily.  5 Ongoing outpatient treatment for UTI, on Keflex. Repeat UA: negative.  6. Hyperlipidemia continue statins  7. Hyperkalemia Improved. Hold off on by mouth potassium  8. DVT prophylaxis SCD teds Recent evaluation for GI bleed.  9. Weakness. physical therapy.  I discussed with Dr. Wynelle LinkKolluru.  All the records are reviewed and case discussed with Care Management/Social Workerr. Management plans discussed with the patient, family and they are in agreement.  CODE STATUS: FULL CODE  DVT Prophylaxis: SCDs  TOTAL TIME TAKING CARE OF THIS  PATIENT: 38 minutes.   POSSIBLE D/C IN 2 DAYS, DEPENDING ON CLINICAL CONDITION.  Shaune Pollackhen, Jenni Thew M.D on 01/02/2016 at 12:35 PM  Between 7am to 6pm - Pager - [phone]  After 6pm go to [email or web address] - password [email or web address] [facility]  [facility]  Office  [phone]  CC: Primary care physician; Mickey FarberHIES, DAVID, MD  Note: This dictation was prepared with Dragon dictation along with smaller phrase technology. Any transcriptional errors that result from this process are unintentional.

## 2016-01-02 NOTE — Progress Notes (Signed)
Pt having chocking episode while eating breakfast / RN to bedside/ continues to cough with white foam appearing/ Pt pulled up in bed/ VSS/ no distress noted/ Dr. Imogene Burnhen made aware of poss aspiration and at pts bedside/ chest xray ordered.

## 2016-01-02 NOTE — Progress Notes (Signed)
PT Cancellation Note  Patient Details Name: Allison Shepard MRN: [id number] DOB: Aug 05, 1934   Cancelled Treatment:    Reason Eval/Treat Not Completed: Patient declined, no reason specified (tired and in some severe pain).  Will see her after her imaging procedure after pain meds.   Ivar DrapeStout, Danaiya Steadman E 01/02/2016, 11:36 AM    Samul Dadauth Jahlisa Rossitto, PT MS Acute Rehab Dept. Number: [facility] [phone] and [facility] [phone]

## 2016-01-02 NOTE — Evaluation (Signed)
Physical Therapy Evaluation Patient Details Name: Allison Shepard MRN: [id number] DOB: Jan 02, 1934 Today's Date: 01/02/2016   History of Present Illness  82 yo female with onset of CHF and edema all over was admitted, has recent UTI admission.  PMHx:  COPD, EF55%, chronic O2 use, UTI,  Clinical Impression  Pt is up to bedside with PT and is unable to breathe well in bed.  Has been in a slumped posture and after sitting could get her up to higher perch on the bed.  Pt is typically able to walk and will need to be in inpt therapy to return home at this level so recommend SNF.  Will focus on standing and strengthening her control of posture and balance in standing until discharged.    Follow Up Recommendations SNF    Equipment Recommendations  None recommended by PT    Recommendations for Other Services Rehab consult     Precautions / Restrictions Precautions Precautions: Fall (telemtry) Restrictions Weight Bearing Restrictions: No Other Position/Activity Restrictions: sit upright for meals       Mobility  Bed Mobility Overal bed mobility: Needs Assistance Bed Mobility: Supine to Sit;Sit to Supine     Supine to sit: Mod assist Sit to supine: Mod assist;HOB elevated   General bed mobility comments: had pt get up to bedside to see how sitting control looks then pt helped scoot up bedside in sitting  Transfers Overall transfer level: Needs assistance Equipment used: 1 person hand held assist;Rolling walker (2 wheeled) Transfers: Sit to/from Stand Sit to Stand: Min assist;Mod assist;From elevated surface         General transfer comment: reminders for hand placement  Ambulation/Gait             General Gait Details: unable  Stairs            Wheelchair Mobility    Modified Rankin (Stroke Patients Only)       Balance Overall balance assessment: Needs assistance Sitting-balance support: Bilateral upper extremity supported;Feet supported Sitting  balance-Leahy Scale: Fair Sitting balance - Comments: weak trunk control   Standing balance support: Bilateral upper extremity supported Standing balance-Leahy Scale: Poor                               Pertinent Vitals/Pain Pain Assessment: Faces Faces Pain Scale: Hurts little more Pain Location: ribcage and legs Pain Descriptors / Indicators: Aching Pain Intervention(s): Monitored during session;Premedicated before session;Repositioned    Home Living Family/patient expects to be discharged to:: Private residence Living Arrangements: Spouse/significant other Available Help at Discharge: Family Type of Home: House Home Access: Stairs to enter Entrance Stairs-Rails: None Secretary/administrator of Steps: 2 Home Layout: One level Home Equipment: Wheelchair - manual Additional Comments: O2 3L at home, has uncontrolled HR per pt    Prior Function Level of Independence: Independent with assistive device(s)               Hand Dominance   Dominant Hand: Right    Extremity/Trunk Assessment   Upper Extremity Assessment: Generalized weakness           Lower Extremity Assessment: Generalized weakness      Cervical / Trunk Assessment: Kyphotic  Communication   Communication: No difficulties  Cognition Arousal/Alertness: Awake/alert Behavior During Therapy: WFL for tasks assessed/performed Overall Cognitive Status: Within Functional Limits for tasks assessed  General Comments General comments (skin integrity, edema, etc.): Pt has reduced endurance due to her breathing and her need for O2.  Has tolerated bedside sitting and can assist mod assist to scoot up side of bed and then into bed.    Exercises        Assessment/Plan    PT Assessment Patient needs continued PT services  PT Diagnosis Generalized weakness   PT Problem List Decreased strength;Decreased range of motion;Decreased activity tolerance;Decreased  balance;Decreased mobility;Decreased coordination;Decreased knowledge of use of DME;Decreased safety awareness;Cardiopulmonary status limiting activity;Obesity;Pain  PT Treatment Interventions DME instruction;Gait training;Stair training;Functional mobility training;Therapeutic activities;Therapeutic exercise;Balance training;Neuromuscular re-education;Patient/family education   PT Goals (Current goals can be found in the Care Plan section) Acute Rehab PT Goals Patient Stated Goal: to get her breathing to feel better PT Goal Formulation: With patient Time For Goal Achievement: 01/16/16 Potential to Achieve Goals: Good    Frequency Min 2X/week   Barriers to discharge Other (comment) (unable to walk to quickly transition home) needs rehab to strengthen to recover gait skills    Co-evaluation               End of Session Equipment Utilized During Treatment: Gait belt;Oxygen Activity Tolerance: Patient tolerated treatment well;Patient limited by fatigue;Other (comment) (SOB) Patient left: in bed;with call bell/phone within reach;with bed alarm set (sitting upright in bed) Nurse Communication: Mobility status         Time: [id number] PT Time Calculation (min) (ACUTE ONLY): 27 min   Charges:   PT Evaluation $PT Eval Moderate Complexity: 1 Procedure PT Treatments $Therapeutic Activity: 8-22 mins   PT G Codes:        Ivar DrapeStout, Lauralie Blacksher E 01/02/2016, 6:16 PM    Samul Dadauth Remington Skalsky, PT MS Acute Rehab Dept. Number: [facility] [phone] and [facility] [phone]

## 2016-01-03 LAB — GLUCOSE, CAPILLARY
GLUCOSE-CAPILLARY: 200 mg/dL — AB (ref 65–99)
GLUCOSE-CAPILLARY: 281 mg/dL — AB (ref 65–99)
Glucose-Capillary: 225 mg/dL — ABNORMAL HIGH (ref 65–99)
Glucose-Capillary: 193 mg/dL — ABNORMAL HIGH (ref 65–99)

## 2016-01-03 LAB — BASIC METABOLIC PANEL
ANION GAP: 9 (ref 5–15)
BUN: 83 mg/dL — AB (ref 6–20)
CHLORIDE: 103 mmol/L (ref 101–111)
CO2: 27 mmol/L (ref 22–32)
Calcium: 8.3 mg/dL — ABNORMAL LOW (ref 8.9–10.3)
Creatinine, Ser: 2.54 mg/dL — ABNORMAL HIGH (ref 0.44–1.00)
GFR calc Af Amer: 20 mL/min — ABNORMAL LOW (ref >60)
GFR, EST NON AFRICAN AMERICAN: 17 mL/min — AB (ref >60)
GLUCOSE: 241 mg/dL — AB (ref 65–99)
POTASSIUM: 4.6 mmol/L (ref 3.5–5.1)
Sodium: 139 mmol/L (ref 135–145)

## 2016-01-03 MED ORDER — BUDESONIDE 0.25 MG/2ML IN SUSP
0.2500 mg | Freq: Four times a day (QID) | RESPIRATORY_TRACT | Status: DC
  Administered 2016-01-03 – 2016-01-06 (×12): 0.25 mg via RESPIRATORY_TRACT
  Filled 2016-01-03 (×13): qty 2

## 2016-01-03 MED ORDER — FUROSEMIDE 20 MG PO TABS
20.0000 mg | ORAL_TABLET | Freq: Every day | ORAL | Status: DC
  Administered 2016-01-04 – 2016-01-06 (×3): 20 mg via ORAL
  Filled 2016-01-03 (×4): qty 1

## 2016-01-03 MED ORDER — ALBUTEROL SULFATE (2.5 MG/3ML) 0.083% IN NEBU
2.5000 mg | INHALATION_SOLUTION | RESPIRATORY_TRACT | Status: DC | PRN
  Administered 2016-01-04: 2.5 mg via RESPIRATORY_TRACT
  Filled 2016-01-03: qty 3

## 2016-01-03 MED ORDER — IPRATROPIUM-ALBUTEROL 0.5-2.5 (3) MG/3ML IN SOLN
3.0000 mL | Freq: Four times a day (QID) | RESPIRATORY_TRACT | Status: DC
  Administered 2016-01-03 – 2016-01-06 (×13): 3 mL via RESPIRATORY_TRACT
  Filled 2016-01-03 (×15): qty 3

## 2016-01-03 MED ORDER — INSULIN GLARGINE 100 UNIT/ML ~~LOC~~ SOLN
30.0000 [IU] | Freq: Every morning | SUBCUTANEOUS | Status: DC
  Administered 2016-01-03 – 2016-01-06 (×4): 30 [IU] via SUBCUTANEOUS
  Filled 2016-01-03 (×5): qty 0.3

## 2016-01-03 MED ORDER — HALOPERIDOL LACTATE 5 MG/ML IJ SOLN
1.0000 mg | Freq: Once | INTRAMUSCULAR | Status: AC
  Administered 2016-01-03: 1 mg via INTRAVENOUS
  Filled 2016-01-03: qty 1

## 2016-01-03 NOTE — Progress Notes (Signed)
Per MD Imogene Burnhen order an ABG d/t lethargy, pt seems to be huffing and difficult to rouse.Marland Kitchen.  He declines a neuro consult

## 2016-01-03 NOTE — Progress Notes (Addendum)
[facility] Physicians -  at [facility]   PATIENT NAME: Allison Shepard    MR#:  [id number]  DATE OF BIRTH:  1934-07-16  SUBJECTIVE:  CHIEF COMPLAINT:   Chief Complaint  Patient presents with  . Respiratory Distress   Pt is sleepy,  on O2 [place] 2 L. REVIEW OF SYSTEMS:    CONSTITUTIONAL: No fever, Generalized weakness.  EYES: No blurred or double vision.  EARS, NOSE, AND THROAT: No tinnitus or ear pain.  RESPIRATORY: has cough, shortness of breath and wheezing but no hemoptysis.  CARDIOVASCULAR: No chest pain, orthopnea, edema.  GASTROINTESTINAL: No nausea, vomiting, diarrhea or abdominal pain.  GENITOURINARY: No dysuria, hematuria.  ENDOCRINE: No polyuria, nocturia,  HEMATOLOGY: No anemia, easy bruising or bleeding SKIN: No rash or lesion. MUSCULOSKELETAL: No joint pain or arthritis.  NEUROLOGIC: No tingling, numbness, weakness.  PSYCHIATRY: No anxiety or depression.   DRUG ALLERGIES:   Allergies  Allergen Reactions  . Hydrocodone-Chlorpheniramine Other (See Comments)    Confusion  . Hydromorphone Hcl Nausea And Vomiting  . Ibuprofen Other (See Comments)    Pt was told by her MD not to take this medication.    . Mucinex [Guaifenesin Er] Other (See Comments)    Red all over. Felt like i was on fire  . Percocet [Oxycodone-Acetaminophen] Hives    VITALS:  Blood pressure 133/89, pulse 82, temperature 97.5 F (36.4 C), temperature source Oral, resp. rate 20, height  (1.575 m), weight 119.523 kg (263 lb 8 oz), SpO2 93 %.  PHYSICAL EXAMINATION:   Physical Exam  GENERAL:  82 y.o.-year-old patient lying in the bed with no acute distress. Morbid obese. EYES: Pupils equal, round, reactive to light and accommodation. No scleral icterus. Extraocular muscles intact.  HEENT: Head atraumatic, normocephalic. Oropharynx and nasopharynx clear.  NECK:  Supple, no jugular venous distention. No thyroid enlargement, no tenderness.  LUNGS: Normal  breath sounds bilaterally, mild wheezing and no rhonchi. No use of accessory muscles of respiration.  CARDIOVASCULAR: S1, S2 normal. No murmurs, rubs, or gallops.  ABDOMEN: Soft, nontender, nondistended. Bowel sounds present. No organomegaly or mass.  EXTREMITIES: No cyanosis, clubbing or edema b/l.    NEUROLOGIC: Cranial nerves II through XII are intact. No focal Motor or sensory deficits b/l.   PSYCHIATRIC: The patient is alert and oriented x 3.  SKIN: No obvious rash, lesion, or ulcer.   LABORATORY PANEL:   CBC  Recent Labs Lab 01/02/16 0657  WBC 6.3  HGB 8.7*  HCT 28.9*  PLT 253   ------------------------------------------------------------------------------------------------------------------ Chemistries   Recent Labs Lab 12/31/15 1039  01/03/16 0603  NA 140  < > 139  K 5.5*  < > 4.6  CL 106  < > 103  CO2 27  < > 27  GLUCOSE 130*  < > 241*  BUN 55*  < > 83*  CREATININE 2.14*  < > 2.54*  CALCIUM 8.6*  < > 8.3*  AST 32  --   --   ALT 37  --   --   ALKPHOS 150*  --   --   BILITOT 1.4*  --   --   < > = values in this interval not displayed. ------------------------------------------------------------------------------------------------------------------  Cardiac Enzymes  Recent Labs Lab 12/31/15 1039  TROPONINI <0.03   ------------------------------------------------------------------------------------------------------------------  RADIOLOGY:  Dg Chest 2 View  01/02/2016  CLINICAL DATA:  82 year old female with a history of aspiration. EXAM: CHEST  2 VIEW COMPARISON:  12/31/2015 FINDINGS: Cardiomediastinal silhouette unchanged with cardiomegaly. Calcifications  of the aortic arch. Dense opacity at the base of the lungs, most pronounced on the lateral view. No pneumothorax. IMPRESSION: Dense basilar opacity, compatible with aspiration/pneumonitis. Followup PA and lateral chest X-ray is recommended in 3-4 weeks following therapy to ensure resolution. Signed, Yvone NeuJaime  S. Loreta AveWagner, DO [facility] Electronically Signed   By: Gilmer MorJaime  Wagner D.O.   On: 01/02/2016 11:55     ASSESSMENT AND PLAN:   Allison Shepard is a 82 y.o. female with a known history ofCOPD, chronic respiratory failure on 2 L nasal canal oxygen, morbid obesity, chronic atrial fibrillation off anticoagulation secondary to history of GI bleed, type 2 diabetes, hyperlipidemia admitted for SOB  1. Acute on chronic hypoxic respiratory failure with hypoxia.  appears to be due to congestive heart failure and COPD exacerbation Weaned down to O2 [place] 2L. scheduled nebulized steroids and bronchodilators. Robitussin prn.   * Acute diastolic congestive heart failure -  EF of 55% with moderate MR and TR -- IV Lasix, Beta blockers - Input and Output - Counseled to limit fluids and Salt - Monitor Bun/Cr and Potassium -Cardiology follow up after discharge  2. COPD exacerbation taper po prednisone, on augmentin. - Scheduled Nebulizers - Inhalers -Wean O2 as tolerated Per Dr. Sung AmabileSimonds, poor prognosis, needs palliative care consult.  * Aspiration PNA. Change to augmentin.  Robitussin prn. Aspiration precaution.  *Acute Renal Failure on Chronic Kidney Disease stage IV with proteinuria, worsening. Likely cardiorenal syndrome. Monitor while on diuresis. holding olmesartan, Renally dose all medications, avoid nephrotoxic agents per Dr. Wynelle LinkKolluru.  3. Hypertension continue home meds  4. Type 2 diabetes sliding scale insulin and increase lantus from 26 to 30 unit daily.  5 Ongoing outpatient treatment for UTI, on Keflex. Repeat UA: negative.  6. Hyperlipidemia continue statins  7. Hyperkalemia Improved. Hold off on by mouth potassium  8. DVT prophylaxis SCD teds Recent evaluation for GI bleed.  9. Weakness. physical therapy: SNF.  I discussed with Dr. Wynelle LinkKolluru and Dr. Sung AmabileSimonds.  All the records are reviewed and case discussed with Care  Management/Social Workerr. Management plans discussed with the patient, her husband and they are in agreement.  CODE STATUS: FULL CODE  DVT Prophylaxis: SCDs  TOTAL TIME TAKING CARE OF THIS PATIENT: 38 minutes.   POSSIBLE D/C IN 2-3 DAYS, DEPENDING ON CLINICAL CONDITION.  Shaune Pollackhen, Alaysiah Browder M.D on 01/03/2016 at 10:51 AM  Between 7am to 6pm - Pager - [phone]  After 6pm go to [email or web address] - password [email or web address] [facility]  [facility]  Office  [phone]  CC: Primary care physician; Mickey FarberHIES, DAVID, MD  Note: This dictation was prepared with Dragon dictation along with smaller phrase technology. Any transcriptional errors that result from this process are unintentional.

## 2016-01-03 NOTE — Plan of Care (Signed)
Problem: Fluid Volume: Goal: Ability to maintain a balanced intake and output will improve Outcome: Progressing IVF ordered since pt is NPO. Will monitor fluid balance closely since pt has CHF

## 2016-01-03 NOTE — Significant Event (Addendum)
COURTESY VISIT  I know Mrs Noyes very well from the pulmonary clinic. Her husband notified me that she is in the hospital. PCCM has not been officially consulted. We first met her in Dec 2016 when she was hospitalized for pneumococcal PNA. She has COPD, CHF, obesity and chronic, debilitating dyspnea with which she has become very frustrated. She has expressed great dissatisfaction with her QOL over the past several months. I stopped in to say hi to her yesterday.   I spoke with Dr Bridgett Larsson and will order scheduled nebulized steroids and bronchodilators and a palliative care consultation  Merton Border, MD PCCM service Mobile [phone] Pager [phone] 01/03/2016

## 2016-01-03 NOTE — Progress Notes (Signed)
[facility]  ROUNDING NOTE   Subjective:   Aspirating food and medications. Daughter and husband at bedside.   Objective:  Vital signs in last 24 hours:  Temp:  [97.4 F (36.3 C)-98.1 F (36.7 C)] 98.1 F (36.7 C) (05/21 1132) Pulse Rate:  [82-91] 91 (05/21 1132) Resp:  [18-20] 20 (05/21 1132) BP: (121-133)/(74-89) 128/89 mmHg (05/21 1132) SpO2:  [93 %-94 %] 94 % (05/21 1132)  Weight change:  Filed Weights   12/31/15 1013 12/31/15 1706  Weight: 108.863 kg (240 lb) 119.523 kg (263 lb 8 oz)    Intake/Output: I/O last 3 completed shifts: In: 600 [P.O.:600] Out: 400 [Urine:400]   Intake/Output this shift:     Physical Exam: General: NAD, laying in bed  Head: Normocephalic, atraumatic. Moist oral mucosal membranes  Eyes: Anicteric, PERRL  Neck: Supple, trachea midline  Lungs:  Bilateral wheezing  Heart: irregular  Abdomen:  Soft, nontender, obese  Extremities:  1+ peripheral and dependent edema.  Neurologic: confused  Skin: No lesions       Basic Metabolic Panel:  Recent Labs Lab 12/30/15 2143 12/31/15 1039 01/01/16 0418 01/02/16 0657 01/03/16 0603  NA 139 140 140 138 139  K 5.4* 5.5* 5.3* 4.6 4.6  CL 107 106 106 103 103  CO2 26 27 26 26 27   GLUCOSE 100* 130* 222* 198* 241*  BUN 52* 55* 57* 75* 83*  CREATININE 2.07* 2.14* 2.29* 2.49* 2.54*  CALCIUM 8.4* 8.6* 8.5* 8.4* 8.3*    Liver Function Tests:  Recent Labs Lab 12/30/15 1308 12/31/15 1039  AST 26 32  ALT 35 37  ALKPHOS 153* 150*  BILITOT 1.2 1.4*  PROT 6.7 7.0  ALBUMIN 3.6 3.5   No results for input(s): LIPASE, AMYLASE in the last 168 hours. No results for input(s): AMMONIA in the last 168 hours.  CBC:  Recent Labs Lab 12/30/15 1308 12/31/15 1039 01/02/16 0657  WBC 5.1 4.6 6.3  NEUTROABS  --  3.6 5.7  HGB 9.2* 9.3* 8.7*  HCT 30.3* 30.6* 28.9*  MCV 82.2 82.7 83.0  PLT 248 246 253    Cardiac Enzymes:  Recent Labs Lab 12/31/15 1039  TROPONINI <0.03     BNP: Invalid input(s): POCBNP  CBG:  Recent Labs Lab 01/02/16 1148 01/02/16 1640 01/02/16 2130 01/03/16 0733 01/03/16 1130  GLUCAP 209* 292* 362* 225* 200*    Microbiology: Results for orders placed or performed during the hospital encounter of 12/31/15  Blood Culture (routine x 2)     Status: None (Preliminary result)   Collection Time: 12/31/15 10:39 AM  Result Value Ref Range Status   Specimen Description BLOOD LEFT AC  Final   Special Requests   Final    BOTTLES DRAWN AEROBIC AND ANAEROBIC AER 4ML ANA 3ML   Culture NO GROWTH 2 DAYS  Final   Report Status PENDING  Incomplete  Blood Culture (routine x 2)     Status: None (Preliminary result)   Collection Time: 12/31/15 10:39 AM  Result Value Ref Range Status   Specimen Description BLOOD LEFT HAND  Final   Special Requests BOTTLES DRAWN AEROBIC AND ANAEROBIC .5ML  Final   Culture NO GROWTH 2 DAYS  Final   Report Status PENDING  Incomplete  Urine culture     Status: None   Collection Time: 12/31/15 10:39 AM  Result Value Ref Range Status   Specimen Description URINE, RANDOM  Final   Special Requests NONE  Final   Culture NO GROWTH Performed at [facility]  [facility]   Final   Report Status 01/01/2016 FINAL  Final    Coagulation Studies: No results for input(s): LABPROT, INR in the last 72 hours.  Urinalysis: No results for input(s): COLORURINE, LABSPEC, PHURINE, GLUCOSEU, HGBUR, BILIRUBINUR, KETONESUR, PROTEINUR, UROBILINOGEN, NITRITE, LEUKOCYTESUR in the last 72 hours.  Invalid input(s): APPERANCEUR    Imaging: Dg Chest 2 View  01/02/2016  CLINICAL DATA:  82 year old female with a history of aspiration. EXAM: CHEST  2 VIEW COMPARISON:  12/31/2015 FINDINGS: Cardiomediastinal silhouette unchanged with cardiomegaly. Calcifications of the aortic arch. Dense opacity at the base of the lungs, most pronounced on the lateral view. No pneumothorax. IMPRESSION: Dense basilar opacity, compatible with  aspiration/pneumonitis. Followup PA and lateral chest X-ray is recommended in 3-4 weeks following therapy to ensure resolution. Signed, Dulcy Fanny. Earleen Newport, DO [facility] Radiology Electronically Signed   By: Corrie Mckusick D.O.   On: 01/02/2016 11:55     Medications:     . amoxicillin-clavulanate  1 tablet Oral Daily  . aspirin EC  81 mg Oral Daily  . budesonide (PULMICORT) nebulizer solution  0.25 mg Nebulization Q6H  . calcium carbonate  1.5 tablet Oral Daily  . cholecalciferol  1,000 Units Oral Daily  . diltiazem  180 mg Oral Daily  . furosemide  20 mg Oral Daily  . insulin aspart  0-15 Units Subcutaneous TID WC  . insulin aspart  0-5 Units Subcutaneous QHS  . insulin glargine  30 Units Subcutaneous q morning - 10a  . ipratropium-albuterol  3 mL Nebulization Q6H  . levothyroxine  100 mcg Oral QAC breakfast  . metoprolol succinate  100 mg Oral Daily  . ondansetron  4 mg Oral BID  . pantoprazole  40 mg Oral Daily  . predniSONE  50 mg Oral Q breakfast  . simvastatin  10 mg Oral Daily   acetaminophen **OR** acetaminophen, albuterol, bisacodyl, guaiFENesin, senna-docusate, zolpidem  Assessment/ Plan:  Ms Bellissimo is a 82 year old white female with atrial fibrillation, hypertension, COPD, osteoarthritis, hyperlipidemia, osteopenia, allergic rhinitis, lumbar spinal stenosis presents as a follow up patient chronic kidney disease stage III with proteinuria.   1. Acute Renal Failure on Chronic Kidney Disease stage IV with proteinuria: Acute renal failure from hypoxia and acute cardio-renal syndrome.  Baseline creatinine of 1.8, eGFR of 27. 10/26/15. Chronic Kidney Disease secondary to diabetic nephropathy and hypertensive nephrosclerosis - holding olmesartan - Renally dose all medications, avoid nephrotoxic agents  - Continue to monitor.   2. Hypertension and Edema:   - Changed from IV furosemide to PO but now unable to swallow medications.    3. Secondary Hyperparathyroidism: calcium at goal. PTH and phosphorus at goal.  4. Diabetes Mellitus type II with chronic kidney disease:  - Continue glucose control.   5. COPD: now with aspiration. Wheezing on examination.  - Appreciate pulmonary input.     LOS: [place], [place] 5/21/201712:23 PM

## 2016-01-03 NOTE — Evaluation (Signed)
Clinical/Bedside Swallow Evaluation Patient Details  Name: Allison Shepard MRN: [id number] Date of Birth: 07-12-1934  Today's Date: 01/03/2016 Time: SLP Start Time (ACUTE ONLY): 1640 SLP Stop Time (ACUTE ONLY): 1740 SLP Time Calculation (min) (ACUTE ONLY): 60 min  Past Medical History:  Past Medical History  Diagnosis Date  . COPD (chronic obstructive pulmonary disease) (HCC)   . Atrial fibrillation (HCC)   . Hypertension   . Hypercholesteremia   . Pneumonia   . Diabetes mellitus without complication (HCC)     type 2  . Headache     migraines - years ago  . Shortness of breath dyspnea   . Asthma   . Dysrhythmia     afib  . Chronic kidney disease     history of insufficiency  . Arthritis   . Spinal disorder     stenosis  . Complication of anesthesia     difficulty to wake up after an 8 hour back surgery  . Edema     legs/feet  . Wheezing    Past Surgical History:  Past Surgical History  Procedure Laterality Date  . Abdominal hysterectomy    . Shoulder fusion surgery Right   . Total knee arthroplasty Right     knee replacement  . Total hip arthroplasty Right     hip replacement  . Back surgery    . Cataract extraction      cataract surgery ? eye  . Fracture surgery Right     right arm 20 years ago  . Colonoscopy      x 3  . Orif distal radius fracture Left 06/17/2015  . Open reduction internal fixation (orif) distal radial fracture Left 06/17/2015    Procedure: OPEN REDUCTION INTERNAL FIXATION LEFT  DISTAL RADIUS AND ULNA FRACTURES;  Surgeon: Tarry KosNaiping M Xu, MD;  Location: [facility] OR;  Service: Orthopedics;  Laterality: Left;  . Joint replacement    . Hernia repair    . Cataract extraction w/phaco Left 07/28/2015    Procedure: CATARACT EXTRACTION PHACO AND INTRAOCULAR LENS PLACEMENT (IOC);  Surgeon: Galen ManilaWilliam Porfilio, MD;  Location: [facility] ORS;  Service: Ophthalmology;  Laterality: Left;  US 2:16   . Esophagogastroduodenoscopy (egd) with propofol N/A 12/24/2015     Procedure: ESOPHAGOGASTRODUODENOSCOPY (EGD) WITH PROPOFOL;  Surgeon: Rachael Feeaniel P Jacobs, MD;  Location: [facility] ENDOSCOPY;  Service: Endoscopy;  Laterality: N/A;   HPI:  Pt is a 82 y.o. female with a known history of multiple medical issues including GERD, COPD, chronic respiratory failure on 2 L nasal canal oxygen, morbid obesity, chronic atrial fibrillation off anticoagulation secondary to history of GI bleed, type 2 diabetes, hyperlipidemia comes to the emergency room accompanied by her husband complaining of increasing shortness of breath very hard to get around at home and found to have hypoxia on her 2 L nasal cannula oxygen. Her sats dropped down in the upper 80s. She is currently sleepy and according to the husband did not sleep all last night because she was very uncomfortable and could not get her breath out. She was seen in the emergency room recently and started on Keflex for UTI sent home. Patient is being admitted with congestive heart failure. She has elevated BNP. She has leg edema. And she was recently seen in cardiology office for the same. Per recent GI chart notes, she was evaluated for fits of "vomiting". She actually described severe coughing 2-3 times per week, usually about 2 hours after eating. After coughing for a very prolonged period time, she  described extreme phlegm production and spitting out. She says no food ever comes out. She said is not like vomiting. An upper endoscopy was recommended but no results are found in chart; unsure if she had the procedure(?). Pt was eating at a meal and began choking on the solid food eaten. Unsure if pt was sleepy as she presents now. Pt was made NPO until ST evaluation. Noted pt could awaken for brief periods but required max verbal/tactile cues to do so. When she was awake, she was alert to SLP, but she immediately dropped off to sleep again. Also noted pt exhaled forcefully w/ each breath but did not appear SOB or have WOB. NSG had noted same and is  consulting MD for an ABG. Pt is admitted for CHF.   Assessment / Plan / Recommendation Clinical Impression  Pt appears to present w/ moderate risk for aspiration sec. to her declined Cognitive status and poor ability to maintain an awake state. Pt was able to be awakened for brief moments w/ max verbal/tactile stimulation but then fell asleep again w/ heavy snoring when not given the stimulation. Pt exhibited heavy exhalations w/ blowing then heavy snoring w/ inhalation at rest. When she awakened to stimluation, she was able to accept TSP boluses but upon accepting the TSP boluses, pt appeared uncoordinated in the task sec. to Cognitive status and the timing of the heavy exhalations. SHe was able to transfer, swallow and clear the TSP bolus trials appropriately and timely when engaged in the task. No trials of thin liquids or solids were attempted. Due to her intermittent periods of wakefulness, pt is at increased risk for aspiration. MD consulted and agreed w/ modifying her diet to a Dysphagia 1 w/ Honey consistency liquids at this time; strict aspiration precautions; strict feeding by NSG staff to monitor pt's status and appropriateness for po's at that moment. MD stated Palliative Care has been consulted. Discussed w/ NSG who will monitor pt during any po's; ST will f/u w/ pt's status and progress while admitted.     Aspiration Risk  Moderate aspiration risk - severe aspiration risk (d/t alertness)   Diet Recommendation  Dysphagia 1 w/ Honey consistency liquids; strict aspiration precautions; feeding assistance and monitoring   Medication Administration: Crushed with puree    Other  Recommendations Recommended Consults: Consider GI evaluation (has followed recently; Dietician consult) Oral Care Recommendations: Oral care BID;Staff/trained caregiver to provide oral care Other Recommendations: Order thickener from pharmacy;Prohibited food (jello, ice cream, thin soups);Remove water pitcher   Follow  up Recommendations  Skilled Nursing facility (TBD)    Frequency and Duration min 3x week  2 weeks       Prognosis Prognosis for Safe Diet Advancement: Guarded Barriers to Reach Goals: Cognitive deficits;Severity of deficits;Behavior      Swallow Study   General Date of Onset: 12/31/15 HPI: Pt is a 82 y.o. female with a known history of multiple medical issues including GERD, COPD, chronic respiratory failure on 2 L nasal canal oxygen, morbid obesity, chronic atrial fibrillation off anticoagulation secondary to history of GI bleed, type 2 diabetes, hyperlipidemia comes to the emergency room accompanied by her husband complaining of increasing shortness of breath very hard to get around at home and found to have hypoxia on her 2 L nasal cannula oxygen. Her sats dropped down in the upper 80s. She is currently sleepy and according to the husband did not sleep all last night because she was very uncomfortable and could not get her breath  out. She was seen in the emergency room recently and started on Keflex for UTI sent home. Patient is being admitted with congestive heart failure. She has elevated BNP. She has leg edema. And she was recently seen in cardiology office for the same. Per recent GI chart notes, she was evaluated for fits of "vomiting". She actually described severe coughing 2-3 times per week, usually about 2 hours after eating. After coughing for a very prolonged period time, she described extreme phlegm production and spitting out. She says no food ever comes out. She said is not like vomiting. An upper endoscopy was recommended but no results are found in chart; unsure if she had the procedure(?). Pt was eating at a meal and began choking on the solid food eaten. Unsure if pt was sleepy as she presents now. Pt was made NPO until ST evaluation. Noted pt could awaken for brief periods but required max verbal/tactile cues to do so. When she was awake, she was alert to SLP, but she immediately  dropped off to sleep again. Also noted pt exhaled forcefully w/ each breath but did not appear SOB or have WOB. NSG had noted same and is consulting MD for an ABG. Pt is admitted for CHF. Type of Study: Bedside Swallow Evaluation Previous Swallow Assessment: none indicated; scheduled for EGD recently Diet Prior to this Study: Regular;Thin liquids (then made NPO today by NSG) Temperature Spikes Noted: No (wbc 6.3 yesterday) Respiratory Status: Nasal cannula (2 liters) History of Recent Intubation: No Behavior/Cognition: Cooperative;Pleasant mood;Confused;Lethargic/Drowsy;Requires cueing (Awake for brief moments w/ stimulation then asleep again) Oral Cavity Assessment:  (difficult to assess sec. to sleepiness) Oral Care Completed by SLP: Recent completion by staff Oral Cavity - Dentition: Missing dentition Vision:  (n/a) Self-Feeding Abilities: Total assist Patient Positioning: Upright in bed Baseline Vocal Quality: Normal (w just the few verbalizations when awake) Volitional Cough: Cognitively unable to elicit Volitional Swallow: Unable to elicit    Oral/Motor/Sensory Function Overall Oral Motor/Sensory Function:  (unable to assess sec. to Cognition(declined))   Ice Chips Ice chips: Not tested   Thin Liquid Thin Liquid: Not tested    Nectar Thick Nectar Thick Liquid: Not tested   Honey Thick Honey Thick Liquid: Impaired Presentation: Spoon (fed; 3 trials) Oral Phase Impairments: Poor awareness of bolus (poor coordination accepting tsp boluses) Oral Phase Functional Implications:  (wfl) Pharyngeal Phase Impairments:  (none) Other Comments: when accepting the tsp boluses, pt was uncoordinated sec. to Cognitive status and the timing of the heavy exhalations.    Puree Puree: Impaired Presentation: Spoon (fed; 3 trials) Oral Phase Impairments: Poor awareness of bolus (same as w/ Honey consistency trials) Oral Phase Functional Implications:  (none) Pharyngeal Phase Impairments:   (none) Other Comments: same as w/ the Honey consistency trials   Solid   GO   Solid: Not tested       Jerilynn Som, MS, CCC-SLP  Sarahanne Novakowski 01/03/2016,5:54 PM

## 2016-01-04 LAB — BASIC METABOLIC PANEL
Anion gap: 7 (ref 5–15)
BUN: 79 mg/dL — AB (ref 6–20)
CHLORIDE: 106 mmol/L (ref 101–111)
CO2: 28 mmol/L (ref 22–32)
CREATININE: 2.29 mg/dL — AB (ref 0.44–1.00)
Calcium: 8.9 mg/dL (ref 8.9–10.3)
GFR calc Af Amer: 22 mL/min — ABNORMAL LOW (ref >60)
GFR calc non Af Amer: 19 mL/min — ABNORMAL LOW (ref >60)
Glucose, Bld: 268 mg/dL — ABNORMAL HIGH (ref 65–99)
Potassium: 4.5 mmol/L (ref 3.5–5.1)
SODIUM: 141 mmol/L (ref 135–145)

## 2016-01-04 LAB — BLOOD GAS, ARTERIAL
ACID-BASE EXCESS: 3.2 mmol/L — AB (ref 0.0–3.0)
Allens test (pass/fail): POSITIVE — AB
BICARBONATE: 29.7 meq/L — AB (ref 21.0–28.0)
FIO2: 0.21
O2 Saturation: 83.3 %
PCO2 ART: 55 mmHg — AB (ref 32.0–48.0)
PH ART: 7.34 — AB (ref 7.350–7.450)
PO2 ART: 51 mmHg — AB (ref 83.0–108.0)
Patient temperature: 37

## 2016-01-04 LAB — GLUCOSE, CAPILLARY
GLUCOSE-CAPILLARY: 361 mg/dL — AB (ref 65–99)
GLUCOSE-CAPILLARY: 387 mg/dL — AB (ref 65–99)
Glucose-Capillary: 198 mg/dL — ABNORMAL HIGH (ref 65–99)
Glucose-Capillary: 236 mg/dL — ABNORMAL HIGH (ref 65–99)

## 2016-01-04 LAB — MAGNESIUM: MAGNESIUM: 2.8 mg/dL — AB (ref 1.7–2.4)

## 2016-01-04 MED ORDER — INSULIN ASPART 100 UNIT/ML ~~LOC~~ SOLN
4.0000 [IU] | Freq: Three times a day (TID) | SUBCUTANEOUS | Status: DC
  Administered 2016-01-04 – 2016-01-06 (×6): 4 [IU] via SUBCUTANEOUS
  Filled 2016-01-04 (×6): qty 4

## 2016-01-04 MED ORDER — PREDNISONE 20 MG PO TABS
40.0000 mg | ORAL_TABLET | Freq: Every day | ORAL | Status: DC
  Administered 2016-01-05: 40 mg via ORAL
  Filled 2016-01-04: qty 2

## 2016-01-04 MED ORDER — AMOXICILLIN-POT CLAVULANATE 500-125 MG PO TABS
1.0000 | ORAL_TABLET | Freq: Two times a day (BID) | ORAL | Status: DC
  Administered 2016-01-04 – 2016-01-06 (×4): 500 mg via ORAL
  Filled 2016-01-04 (×4): qty 1

## 2016-01-04 NOTE — Progress Notes (Signed)
Speech Language Pathology Treatment: Dysphagia  Patient Details Name: Allison Shepard MRN: [id number] DOB: Jan 18, 1934 Today's Date: 01/04/2016 Time: 1400-1500 SLP Time Calculation (min) (ACUTE ONLY): 60 min  Assessment / Plan / Recommendation Clinical Impression  Pt appeared to adequately tolerate trials of thin liquids via cup and straw w/ supervision and monitoring of bolus amounts; rest breaks given b/t trials to avoid any increased WOB/SOB. Pt did exhibit coughing several minutes post trials and even coughing which awakened her - suspect d/t increased saliva held orally and unaware of. With supervision w/ oral intake, recommend upgrade diet to Puree w/ thin liquids; strict aspiration precautions; feeding assistance. Recommend meds in puree - crushed as able. ST will f/u w/ toleration of diet and trials to upgrade as appropriate. Education provided to pt and husband on aspiration precautions; Reflux precautions. Recommend a PPI if not already on one; will f/u w/ NSG/MD tomorrow.    HPI HPI: Pt is awake today, sitting in a chair in room. Husband present. Pt is verbally conversive today (excessively) but w/ increased confusion and required max verbal/tactile cues to maintain attention to task. Noted pt continued to exhale forcefully w/ each breath but did not appear SOB or have WOB. Pt was able to assist in self-feeding w/ cues. When she dozed off x1 post session, pt awakened herself w/ coughing on her own saliva (it appeared). Husband stated she oftens does this at home. He also explained her coughing "spells" after meals in which she expectorates moderate phlegm. Pt stated she felt similar coughing after lunch today.      SLP Plan  Continue with current plan of care     Recommendations  Diet recommendations: Dysphagia 1 (puree);Thin liquid Liquids provided via: Cup;Straw Medication Administration: Crushed with puree Supervision: Staff to assist with self feeding;Full supervision/cueing  for compensatory strategies Compensations: Minimize environmental distractions;Slow rate;Small sips/bites;Multiple dry swallows after each bite/sip;Follow solids with liquid Postural Changes and/or Swallow Maneuvers: Seated upright 90 degrees;Upright 30-60 min after meal (rest breaks as needed)             General recommendations:  (Dietician) Oral Care Recommendations: Oral care BID;Staff/trained caregiver to provide oral care Follow up Recommendations: Skilled Nursing facility Plan: Continue with current plan of care     GO               Jerilynn SomKatherine Watson, MS, CCC-SLP  Watson,Katherine 01/04/2016, 4:10 PM

## 2016-01-04 NOTE — Progress Notes (Signed)
[facility] - [place] at [facility]   PATIENT NAME: Allison Shepard    MR#:  [id number]  DATE OF BIRTH:  1934-06-29  SUBJECTIVE:  CHIEF COMPLAINT:   Chief Complaint  Patient presents with  . Respiratory Distress   Pt slept on sofa, woke up upon verbal stimuli,  on O2 [place] 4 L. has cough, shortness of breath and wheezing. REVIEW OF SYSTEMS:    CONSTITUTIONAL: No fever, Generalized weakness.  EYES: No blurred or double vision.  EARS, NOSE, AND THROAT: No tinnitus or ear pain.  RESPIRATORY: has cough, shortness of breath and wheezing but no hemoptysis.  CARDIOVASCULAR: No chest pain, orthopnea, edema.  GASTROINTESTINAL: No nausea, vomiting, diarrhea or abdominal pain.  GENITOURINARY: No dysuria, hematuria.  ENDOCRINE: No polyuria, nocturia,  HEMATOLOGY: No anemia, easy bruising or bleeding SKIN: No rash or lesion. MUSCULOSKELETAL: No joint pain or arthritis.  NEUROLOGIC: No tingling, numbness, weakness.  PSYCHIATRY: No anxiety or depression.   DRUG ALLERGIES:   Allergies  Allergen Reactions  . Hydrocodone-Chlorpheniramine Other (See Comments)    Confusion  . Hydromorphone Hcl Nausea And Vomiting  . Ibuprofen Other (See Comments)    Pt was told by her MD not to take this medication.    . Mucinex [Guaifenesin Er] Other (See Comments)    Red all over. Felt like i was on fire  . Percocet [Oxycodone-Acetaminophen] Hives    VITALS:  Blood pressure 94/81, pulse 58, temperature 97.8 F (36.6 C), temperature source Oral, resp. rate 20, height  (1.575 m), weight 119.523 kg (263 lb 8 oz), SpO2 93 %.  PHYSICAL EXAMINATION:   Physical Exam  GENERAL:  82 y.o.-year-old patient lying in the bed with no acute distress. Morbid obese. EYES: Pupils equal, round, reactive to light and accommodation. No scleral icterus. Extraocular muscles intact.  HEENT: Head atraumatic, normocephalic. Oropharynx and nasopharynx clear.  NECK:  Supple, no  jugular venous distention. No thyroid enlargement, no tenderness.  LUNGS: Normal breath sounds bilaterally, mild wheezing and no rhonchi. No use of accessory muscles of respiration.  CARDIOVASCULAR: S1, S2 normal. No murmurs, rubs, or gallops.  ABDOMEN: Soft, nontender, nondistended. Bowel sounds present. No organomegaly or mass.  EXTREMITIES: No cyanosis, clubbing or edema b/l.    NEUROLOGIC: Cranial nerves II through XII are intact. No focal Motor or sensory deficits b/l.   PSYCHIATRIC: The patient is alert and oriented x 3.  SKIN: No obvious rash, lesion, or ulcer.   LABORATORY PANEL:   CBC  Recent Labs Lab 01/02/16 0657  WBC 6.3  HGB 8.7*  HCT 28.9*  PLT 253   ------------------------------------------------------------------------------------------------------------------ Chemistries   Recent Labs Lab 12/31/15 1039  01/04/16 0430  NA 140  < > 141  K 5.5*  < > 4.5  CL 106  < > 106  CO2 27  < > 28  GLUCOSE 130*  < > 268*  BUN 55*  < > 79*  CREATININE 2.14*  < > 2.29*  CALCIUM 8.6*  < > 8.9  MG  --   --  2.8*  AST 32  --   --   ALT 37  --   --   ALKPHOS 150*  --   --   BILITOT 1.4*  --   --   < > = values in this interval not displayed. ------------------------------------------------------------------------------------------------------------------  Cardiac Enzymes  Recent Labs Lab 12/31/15 1039  TROPONINI <0.03   ------------------------------------------------------------------------------------------------------------------  RADIOLOGY:  No results found.   ASSESSMENT AND PLAN:  Allison Shepard is a 82 y.o. female with a known history ofCOPD, chronic respiratory failure on 2 L nasal canal oxygen, morbid obesity, chronic atrial fibrillation off anticoagulation secondary to history of GI bleed, type 2 diabetes, hyperlipidemia admitted for SOB  1. Acute on chronic hypoxic respiratory failure with hypoxia.  appears to be due to congestive heart  failure and COPD exacerbation Weaned down to O2 [place] 2L. scheduled nebulized steroids and bronchodilators. Robitussin prn.   * Acute diastolic congestive heart failure -  EF of 55% with moderate MR and TR -- IV Lasix, Beta blockers - Input and Output - Counseled to limit fluids and Salt - Monitor Bun/Cr and Potassium -Cardiology follow up after discharge  2. COPD exacerbation taper po prednisone, on augmentin. - Scheduled Nebulizers - Inhalers -Wean O2 as tolerated Per Dr. Sung AmabileSimonds, poor prognosis, needs palliative care consult.  * Aspiration PNA. Changed to augmentin.  Robitussin prn. Aspiration precaution.  *Acute Renal Failure on Chronic Kidney Disease stage IV with proteinuria, worsening. Likely cardiorenal syndrome. Monitor while on diuresis. holding olmesartan, Renally dose all medications, avoid nephrotoxic agents per Dr. Wynelle LinkKolluru.  3. Hypertension continue home meds  4. Type 2 diabetes sliding scale insulin and increase lantus from 26 to 30 unit daily.  5 Ongoing outpatient treatment for UTI, on Keflex. Repeat UA: negative.  6. Hyperlipidemia continue statins  7. Hyperkalemia Improved. Hold off on by mouth potassium  8. DVT prophylaxis SCD teds Recent evaluation for GI bleed.  9. Weakness. physical therapy: SNF.  Palliative care consult.  All the records are reviewed and case discussed with Care Management/Social Workerr. Management plans discussed with the patient, her husband and they are in agreement.  CODE STATUS: FULL CODE  DVT Prophylaxis: SCDs  TOTAL TIME TAKING CARE OF THIS PATIENT: 37 minutes.   POSSIBLE D/C IN 3 DAYS, DEPENDING ON CLINICAL CONDITION.  Shaune Pollackhen, Susette Seminara M.D on 01/04/2016 at 3:31 PM  Between 7am to 6pm - Pager - [phone]  After 6pm go to [email or web address] - password [email or web address] [email or web address]  [facility]  Office  [phone]  CC: Primary care physician; Mickey FarberHIES, DAVID, MD  Note: This dictation was prepared with Dragon dictation  along with smaller phrase technology. Any transcriptional errors that result from this process are unintentional.

## 2016-01-04 NOTE — Clinical Social Work Note (Signed)
Clinical Social Work Assessment  Patient Details  Name: Allison Shepard MRN: [id number] Date of Birth: 1933-09-07  Date of referral:  01/04/16               Reason for consult:  Discharge Planning                Permission sought to share information with:  Family Supports Permission granted to share information::  Yes, Verbal Permission Granted  Name::        Agency::     Relationship::   Allison Shepard - Spouse )  Contact Information:     Housing/Transportation Living arrangements for the past 2 months:  Single Family Home Source of Information:  Spouse, Patient Patient Interpreter Needed:  None Criminal Activity/Legal Involvement Pertinent to Current Situation/Hospitalization:  No - Comment as needed Significant Relationships:  Spouse, Other Family Members Lives with:  Spouse Do you feel safe going back to the place where you live?  Yes Need for family participation in patient care:  Yes (Comment) Allison Shepard- Spouse)  Care giving concerns:  Patient's husband feels that he cannot care for patient in her current stated. Requested SNF placement for STR.    Social Worker assessment / plan:  CSW was consulted by PT stating that patient will benefit from SNF placement. CSW met with patient and her husband at bedside. CSW introduced herself and her role. Per patient's husband patient lives with him. He reports that he cannot care for patient in her current stated. Stated that MD Bridgett Larsson suggested SNF placement and he agreed. CSW proved patient's husband with SNF list. Gained verbal permission to refer patient to SNFs in [facility].   FL2/ PASRR completed and faxed to SNFs in [facility]. Presented bed offers to patient and her husband. Accepted a bed at [facility]. CSW contacted Doug- Admission Coordinator at [facility] to inform him of above. He reports that a bed will be available on Wednesday, May 24. CSW informed Marden Noble that according to MD Chen's note patient will be ready for  discharge when medically stable but estimated 3 days. CSW will continue to follow and assist.   Employment status:  Retired Forensic scientist:  Medicare PT Recommendations:  Happy / Referral to community resources:  [place]  Patient/Family's Response to care:  Patient's husband is in agreement for patient to go to SNF at discharge.   Patient/Family's Understanding of and Emotional Response to Diagnosis, Current Treatment, and Prognosis: Patient's husband reports that he understands patient's diagnosis and understands that she will benefit from SNF. Stated he appreciated CSW's assistance.   Emotional Assessment Appearance:  Appears stated age Attitude/Demeanor/Rapport:   (None) Affect (typically observed):  Calm, Pleasant Orientation:  Oriented to Self, Oriented to Place, Oriented to  Time, Oriented to Situation Alcohol / Substance use:  Not Applicable Psych involvement (Current and /or in the community):  No (Comment)  Discharge Needs  Concerns to be addressed:  Discharge Planning Concerns Readmission within the last 30 days:  No Current discharge risk:  Chronically ill Barriers to Discharge:  Continued Medical Work up   [employer], LCSW 01/04/2016, 4:32 PM

## 2016-01-04 NOTE — Progress Notes (Signed)
Physical Therapy Treatment Patient Details Name: Allison Shepard MRN: [id number] DOB: [birth year] Today's Date: 01/04/2016    History of Present Illness 82 yo female with onset of CHF and edema all over was admitted, has recent UTI admission.  PMHx:  COPD, EF55%, chronic O2 use, UTI,    PT Comments    Pt continues to be limited in functional mobility due to weakness and cognitive status. She demonstrated decreased ability to stay on task and follow commands, requiring max cues for completing tasks. Pt required mod A +2 for STS with FWW. Due to her knees buckling in standing and having small standing tolerance, ambulation deferred during this session. RN notified of pt frequently loosing grip of B UEs and dropping her arms, as well as repetitive statements and AMS. STR is still recommended to address deficits prior to returning home.  Follow Up Recommendations  SNF     Equipment Recommendations  None recommended by PT    Recommendations for Other Services       Precautions / Restrictions Precautions Precautions: Fall Restrictions Weight Bearing Restrictions: No Other Position/Activity Restrictions: sit upright for meals     Mobility  Bed Mobility               General bed mobility comments: up in recliner, NT  Transfers Overall transfer level: Needs assistance Equipment used: Rolling walker (2 wheeled) Transfers: Sit to/from Stand Sit to Stand: Mod assist;+2 physical assistance         General transfer comment: cues for hand placement, anterior weight shift and knee extension to stand upright; knees buckle occasionally  Ambulation/Gait             General Gait Details: NT due to knees buckling in standing and limited standing tolerance   Stairs            Wheelchair Mobility    Modified Rankin (Stroke Patients Only)       Balance Overall balance assessment: Needs assistance Sitting-balance support: Bilateral upper extremity  supported;Feet supported Sitting balance-Leahy Scale: Fair Sitting balance - Comments: weak trunk control   Standing balance support: Bilateral upper extremity supported Standing balance-Leahy Scale: Poor Standing balance comment: knees buckling, poor standing tolerance                    Cognition Arousal/Alertness: Awake/alert Behavior During Therapy: WFL for tasks assessed/performed Overall Cognitive Status: Impaired/Different from baseline Area of Impairment: Orientation;Attention;Memory;Following commands;Awareness   Current Attention Level: Selective;Alternating Memory: Decreased short-term memory Following Commands: Follows one step commands inconsistently       General Comments: Pt repeats herself often throughout session; calls for her husband to bring her her cloths frequently; difficulty following simple commands    Exercises Other Exercises Other Exercises: B LE seated therex: ankle pumps, LAQs, marching, hip add squeezes x15 each. Cues for proper technique and staying on task. Other Exercises: Multiple STS and static standing up to 20 seconds at the time with mod A +2 and FWW. Pt's knees occasionally buckle and requires returning to seated position. Cues for upright posture, knee extension and anterior weight shift to improve standing balance.    General Comments        Pertinent Vitals/Pain Pain Assessment: No/denies pain    Home Living                      Prior Function            PT Goals (current goals can now  be found in the care plan section) Acute Rehab PT Goals Patient Stated Goal: to get her breathing to feel better PT Goal Formulation: With patient Time For Goal Achievement: 01/16/16 Potential to Achieve Goals: Good Progress towards PT goals: Progressing toward goals    Frequency  Min 2X/week    PT Plan Current plan remains appropriate    Co-evaluation             End of Session Equipment Utilized During Treatment:  Gait belt;Oxygen Activity Tolerance: Patient limited by fatigue Patient left: in chair;with call bell/phone within reach;with chair alarm set     Time: [id number] PT Time Calculation (min) (ACUTE ONLY): 23 min  Charges:  $Therapeutic Exercise: 8-22 mins $Therapeutic Activity: 8-22 mins                    G Codes:      Adelene IdlerMindy Jo Orvilla Truett, PT, DPT  01/04/2016, 3:58 PM [phone]

## 2016-01-04 NOTE — Progress Notes (Signed)
Inpatient Diabetes Program Recommendations  AACE/ADA: New Consensus Statement on Inpatient Glycemic Control (2015)  Target Ranges:  Prepandial:   less than 140 mg/dL      Peak postprandial:   less than 180 mg/dL (1-2 hours)      Critically ill patients:  140 - 180 mg/dL   Review of Glycemic ControlResults for Allison Shepard, [name] (MRN [id number]) as of 01/04/2016 10:56  Ref. Range 01/03/2016 07:33 01/03/2016 11:30 01/03/2016 17:16 01/03/2016 20:56 01/04/2016 07:36  Glucose-Capillary Latest Ref Range: 65-99 mg/dL 409225 (H) 811200 (H) 914193 (H) 281 (H) 198 (H)  Results for Allison Shepard, [name] (MRN [id number]) as of 01/04/2016 10:56  Ref. Range 12/08/2015 11:02  Hemoglobin A1C Latest Ref Range: 4.6-6.5 % 6.1   Diabetes history:  Type 2 diabetes Outpatient Diabetes medications: Lantus 26 units q AM, Novolog 8-12 units tid with meals Current orders for Inpatient glycemic control:  Moderate tid with meals and HS, Prednisone 50 mg daily with breakfast Inpatient Diabetes Program Recommendations:   May consider adding Novolog meal coverage 4 units tid with meals.  Hold if patient eats less than 50%.  Thanks, Beryl MeagerJenny Catrena Vari, RN, BC-ADM Inpatient Diabetes Coordinator Pager [phone] (8a-5p)

## 2016-01-04 NOTE — Progress Notes (Signed)
Pt. Was awake most of the night drifting off to sleep for a few minutes at a time. She was pleasantly confused calling for her husband Allison Shepard and other family members. VSS. No SOB or acute distress noted. Will continue to monitor pt.

## 2016-01-04 NOTE — [place] FL2 (Signed)
[place] MEDICAID FL2 LEVEL OF CARE SCREENING TOOL     IDENTIFICATION  Patient Name: Allison Shepard Birthdate: [birth year] Sex: female Admission Date (Current Location): 12/31/2015  [place] and [place] Number:  Chiropodist and Address:  [facility], [address], [place], [place] [address]      Provider Number: [id number]  Attending Physician Name and Address:  Shaune Pollack, MD  Relative Name and Phone Number:       Current Level of Care: Hospital Recommended Level of Care: Skilled Nursing Facility Prior Approval Number:    Date Approved/Denied:   PASRR Number:  [id number])  Discharge Plan: SNF    Current Diagnoses: Patient Active Problem List   Diagnosis Date Noted  . CHF (congestive heart failure) (HCC) 12/31/2015  . Acute respiratory failure with hypoxia (HCC) 07/17/2015  . Left upper lobe pneumonia 07/17/2015  . Hemoptysis 07/17/2015  . Atrial fibrillation with RVR (HCC) 07/17/2015  . Hypotension 07/17/2015  . Renal insufficiency 07/17/2015  . Obesity 07/17/2015  . Metabolic encephalopathy 07/17/2015  . Fracture of radius, distal, with ulna, left, closed 06/17/2015  . S/P ORIF (open reduction internal fixation) fracture 06/17/2015  . Diaphragmatic hernia 04/07/2015  . Restrictive lung disease secondary to obesity 04/07/2015  . Pneumonia 04/07/2015  . COPD exacerbation (HCC) 04/07/2015  . Other emphysema (HCC) 04/07/2015  . Morbid obesity (HCC) 04/07/2015  . Debility 04/07/2015  . Sepsis (HCC) 04/06/2015  . Chronic a-fib (HCC) 03/20/2015  . Acute respiratory distress (HCC) 03/16/2015    Orientation RESPIRATION BLADDER Height & Weight     Self, Time, Situation, Place  O2 (Nasal Cannula 4 (L/min) ) Continent Weight: 263 lb 8 oz (119.523 kg) Height:   (157.5 cm)  BEHAVIORAL SYMPTOMS/MOOD NEUROLOGICAL BOWEL NUTRITION STATUS   (None)  (None) Continent Diet (DYS 1 Fluid consistency:: Honey Thick)  AMBULATORY  STATUS COMMUNICATION OF NEEDS Skin   Extensive Assist Verbally Normal                       Personal Care Assistance Level of Assistance  Bathing, Feeding, Dressing Bathing Assistance: Limited assistance Feeding assistance: Independent Dressing Assistance: Limited assistance     Functional Limitations Info  Sight, Hearing, Speech Sight Info: Adequate Hearing Info: Adequate Speech Info: Adequate    SPECIAL CARE FACTORS FREQUENCY  PT (By licensed PT)     PT Frequency:  (5)              Contractures      Additional Factors Info  Code Status, Allergies, Insulin Sliding Scale Code Status Info:  (Full Code) Allergies Info:  (Hydrocodone-chlorpheniramine, Hydromorphone Hcl, Ibuprofen, Mucinex, Percocet)   Insulin Sliding Scale Info:  (insulin glargine (LANTUS) injection 30 Units 30 Units, Subcutaneous, Every morning - 10a  , insulin aspart (novoLOG) injection 0-5 Units 0-5 Units, Subcutaneous, Daily at bedtime& insulin aspart (novoLOG) injection 0-15 Units 0-15 Units, Subcutaneous, 3 )       Current Medications (01/04/2016):  This is the current hospital active medication list Current Facility-Administered Medications  Medication Dose Route Frequency Provider Last Rate Last Dose  . acetaminophen (TYLENOL) tablet 650 mg  650 mg Oral Q6H PRN Enedina Finner, MD   650 mg at 01/02/16 1747   Or  . acetaminophen (TYLENOL) suppository 650 mg  650 mg Rectal Q6H PRN Enedina Finner, MD      . albuterol (PROVENTIL) (2.5 MG/3ML) 0.083% nebulizer solution 2.5 mg  2.5 mg Nebulization Q3H PRN Oley Balm  Simonds, MD      . amoxicillin-clavulanate (AUGMENTIN) 500-125 MG per tablet 500 mg  1 tablet Oral Daily Shaune PollackQing Nayeli Calvert, MD   500 mg at 01/02/16 1420  . aspirin EC tablet 81 mg  81 mg Oral Daily Enedina FinnerSona Patel, MD   81 mg at 01/02/16 1047  . bisacodyl (DULCOLAX) suppository 10 mg  10 mg Rectal Daily PRN Enedina FinnerSona Patel, MD      . budesonide (PULMICORT) nebulizer solution 0.25 mg  0.25 mg Nebulization Q6H Merwyn Katosavid B  Simonds, MD   0.25 mg at 01/04/16 0753  . calcium carbonate (TUMS - dosed in mg elemental calcium) chewable tablet 300 mg of elemental calcium  1.5 tablet Oral Daily Enedina FinnerSona Patel, MD   300 mg of elemental calcium at 01/02/16 1046  . cholecalciferol (VITAMIN D) tablet 1,000 Units  1,000 Units Oral Daily Enedina FinnerSona Patel, MD   1,000 Units at 01/02/16 1046  . diltiazem (CARDIZEM CD) 24 hr capsule 180 mg  180 mg Oral Daily Enedina FinnerSona Patel, MD   180 mg at 01/02/16 1046  . furosemide (LASIX) tablet 20 mg  20 mg Oral Daily Lamont DowdySarath Kolluru, MD   20 mg at 01/03/16 1128  . guaiFENesin (ROBITUSSIN) 100 MG/5ML solution 100 mg  5 mL Oral Q4H PRN Shaune PollackQing Lasandra Batley, MD   100 mg at 01/02/16 1850  . insulin aspart (novoLOG) injection 0-15 Units  0-15 Units Subcutaneous TID WC Milagros LollSrikar Sudini, MD   3 Units at 01/03/16 1757  . insulin aspart (novoLOG) injection 0-5 Units  0-5 Units Subcutaneous QHS Milagros LollSrikar Sudini, MD   3 Units at 01/03/16 2203  . insulin glargine (LANTUS) injection 30 Units  30 Units Subcutaneous q morning - 10a Shaune PollackQing Arwen Haseley, MD   30 Units at 01/03/16 1210  . ipratropium-albuterol (DUONEB) 0.5-2.5 (3) MG/3ML nebulizer solution 3 mL  3 mL Nebulization Q6H Merwyn Katosavid B Simonds, MD   3 mL at 01/04/16 0753  . levothyroxine (SYNTHROID, LEVOTHROID) tablet 100 mcg  100 mcg Oral QAC breakfast Enedina FinnerSona Patel, MD   100 mcg at 01/03/16 0803  . metoprolol succinate (TOPROL-XL) 24 hr tablet 100 mg  100 mg Oral Daily Enedina FinnerSona Patel, MD   100 mg at 01/02/16 1046  . ondansetron (ZOFRAN) tablet 4 mg  4 mg Oral BID Enedina FinnerSona Patel, MD   4 mg at 01/03/16 2203  . pantoprazole (PROTONIX) EC tablet 40 mg  40 mg Oral Daily Enedina FinnerSona Patel, MD   40 mg at 01/02/16 1046  . predniSONE (DELTASONE) tablet 50 mg  50 mg Oral Q breakfast Shaune PollackQing Artesha Wemhoff, MD   50 mg at 01/03/16 0803  . senna-docusate (Senokot-S) tablet 1 tablet  1 tablet Oral QHS PRN Enedina FinnerSona Patel, MD      . simvastatin (ZOCOR) tablet 10 mg  10 mg Oral Daily Enedina FinnerSona Patel, MD   10 mg at 01/02/16 1000  . zolpidem (AMBIEN) tablet  5 mg  5 mg Oral QHS PRN Enedina FinnerSona Patel, MD   5 mg at 01/02/16 2239     Discharge Medications: Please see discharge summary for a list of discharge medications.  Relevant Imaging Results:  Relevant Lab Results:   Additional Information  (SSN [id number])  Verta Ellenhristina E Sunkins, LCSW

## 2016-01-04 NOTE — Progress Notes (Signed)
[facility]  ROUNDING NOTE   Subjective:  Creatinine currently down to 2.29. BUN also lower at 79. Patient having cough this a.m.  Objective:  Vital signs in last 24 hours:  Temp:  [97.8 F (36.6 C)-98 F (36.7 C)] 97.8 F (36.6 C) (05/22 1131) Pulse Rate:  [58-107] 58 (05/22 1131) Resp:  [18-20] 20 (05/22 1131) BP: (94-122)/(70-96) 94/81 mmHg (05/22 1131) SpO2:  [88 %-93 %] 93 % (05/22 1131)  Weight change:  Filed Weights   12/31/15 1013 12/31/15 1706  Weight: 108.863 kg (240 lb) 119.523 kg (263 lb 8 oz)    Intake/Output:     Intake/Output this shift:  Total I/O In: 240 [P.O.:240] Out: 0   Physical Exam: General: NAD, laying in bed  Head: Normocephalic, atraumatic. Moist oral mucosal membranes  Eyes: Anicteric  Neck: Supple, trachea midline  Lungs:  Bilateral rhonchi, normal effort  Heart: Irregular no rubs  Abdomen:  Soft, nontender, obese  Extremities:  1+ peripheral and dependent edema.  Neurologic: Continues to have intermittent confusion, will follow commands  Skin: No lesions       Basic Metabolic Panel:  Recent Labs Lab 12/31/15 1039 01/01/16 0418 01/02/16 0657 01/03/16 0603 01/04/16 0430  NA 140 140 138 139 141  K 5.5* 5.3* 4.6 4.6 4.5  CL 106 106 103 103 106  CO2 _0 GLUCOSE 130* 222* 198* 241* 268*  BUN 55* 57* 75* 83* 79*  CREATININE 2.14* 2.29* 2.49* 2.54* 2.29*  CALCIUM 8.6* 8.5* 8.4* 8.3* 8.9  MG  --   --   --   --  2.8*    Liver Function Tests:  Recent Labs Lab 12/30/15 1308 12/31/15 1039  AST 26 32  ALT 35 37  ALKPHOS 153* 150*  BILITOT 1.2 1.4*  PROT 6.7 7.0  ALBUMIN 3.6 3.5   No results for input(s): LIPASE, AMYLASE in the last 168 hours. No results for input(s): AMMONIA in the last 168 hours.  CBC:  Recent Labs Lab 12/30/15 1308 12/31/15 1039 01/02/16 0657  WBC 5.1 4.6 6.3  NEUTROABS  --  3.6 5.7  HGB 9.2* 9.3* 8.7*  HCT 30.3* 30.6* 28.9*  MCV 82.2 82.7 83.0  PLT 248 246 253     Cardiac Enzymes:  Recent Labs Lab 12/31/15 1039  TROPONINI <0.03    BNP: Invalid input(s): POCBNP  CBG:  Recent Labs Lab 01/03/16 1130 01/03/16 1716 01/03/16 2056 01/04/16 0736 01/04/16 1130  GLUCAP 200* 193* 281* 198* 236*    Microbiology: Results for orders placed or performed during the hospital encounter of 12/31/15  Blood Culture (routine x 2)     Status: None (Preliminary result)   Collection Time: 12/31/15 10:39 AM  Result Value Ref Range Status   Specimen Description BLOOD LEFT AC  Final   Special Requests   Final    BOTTLES DRAWN AEROBIC AND ANAEROBIC AER 4ML ANA 3ML   Culture NO GROWTH 3 DAYS  Final   Report Status PENDING  Incomplete  Blood Culture (routine x 2)     Status: None (Preliminary result)   Collection Time: 12/31/15 10:39 AM  Result Value Ref Range Status   Specimen Description BLOOD LEFT HAND  Final   Special Requests BOTTLES DRAWN AEROBIC AND ANAEROBIC .5ML  Final   Culture NO GROWTH 3 DAYS  Final   Report Status PENDING  Incomplete  Urine culture     Status: None   Collection Time: 12/31/15 10:39 AM  Result Value  Ref Range Status   Specimen Description URINE, RANDOM  Final   Special Requests NONE  Final   Culture NO GROWTH Performed at [facility]   Final   Report Status 01/01/2016 FINAL  Final    Coagulation Studies: No results for input(s): LABPROT, INR in the last 72 hours.  Urinalysis: No results for input(s): COLORURINE, LABSPEC, PHURINE, GLUCOSEU, HGBUR, BILIRUBINUR, KETONESUR, PROTEINUR, UROBILINOGEN, NITRITE, LEUKOCYTESUR in the last 72 hours.  Invalid input(s): APPERANCEUR    Imaging: No results found.   Medications:     . amoxicillin-clavulanate  1 tablet Oral Daily  . aspirin EC  81 mg Oral Daily  . budesonide (PULMICORT) nebulizer solution  0.25 mg Nebulization Q6H  . calcium carbonate  1.5 tablet Oral Daily  . cholecalciferol  1,000 Units Oral Daily  . diltiazem  180 mg Oral Daily  .  furosemide  20 mg Oral Daily  . insulin aspart  0-15 Units Subcutaneous TID WC  . insulin aspart  0-5 Units Subcutaneous QHS  . insulin glargine  30 Units Subcutaneous q morning - 10a  . ipratropium-albuterol  3 mL Nebulization Q6H  . levothyroxine  100 mcg Oral QAC breakfast  . metoprolol succinate  100 mg Oral Daily  . ondansetron  4 mg Oral BID  . pantoprazole  40 mg Oral Daily  . [START ON 01/05/2016] predniSONE  40 mg Oral Q breakfast  . simvastatin  10 mg Oral Daily   acetaminophen **OR** acetaminophen, albuterol, bisacodyl, guaiFENesin, senna-docusate, zolpidem  Assessment/ Plan:  [name] is a 82 year old white female with atrial fibrillation, hypertension, COPD, osteoarthritis, hyperlipidemia, osteopenia, allergic rhinitis, lumbar spinal stenosis presents as a follow up patient chronic kidney disease stage III with proteinuria.   1. Acute Renal Failure on Chronic Kidney Disease stage IV with proteinuria: Acute renal failure from hypoxia and acute cardio-renal syndrome.  Baseline creatinine of 1.8, eGFR of 27. 10/26/15. Chronic Kidney Disease secondary to diabetic nephropathy and hypertensive nephrosclerosis - renal function has improved a bit.  Creatinine currently 2.3 but still above baseline.   Continue to monitor renal function trend daily.  2. Hypertension and Edema:   -  Blood pressure currently 94/81.  Continue metoprolol and diltiazem at their current doses.  3. Secondary Hyperparathyroidism: calcium up to 8.9.  Continue to periodically monitor PTH, phosphorus, calcium.  4. Diabetes Mellitus type II with chronic kidney disease:  - Continue glucose control Per hospitalist.  5. COPD: now with aspiration.  - Appreciate pulmonary input, currently on prednisone.    LOS: 4 Fortune Brannigan 5/22/20171:38 PM

## 2016-01-05 DIAGNOSIS — E662 Morbid (severe) obesity with alveolar hypoventilation: Secondary | ICD-10-CM

## 2016-01-05 DIAGNOSIS — I129 Hypertensive chronic kidney disease with stage 1 through stage 4 chronic kidney disease, or unspecified chronic kidney disease: Secondary | ICD-10-CM

## 2016-01-05 DIAGNOSIS — Z9981 Dependence on supplemental oxygen: Secondary | ICD-10-CM

## 2016-01-05 DIAGNOSIS — E1122 Type 2 diabetes mellitus with diabetic chronic kidney disease: Secondary | ICD-10-CM

## 2016-01-05 DIAGNOSIS — Z8719 Personal history of other diseases of the digestive system: Secondary | ICD-10-CM

## 2016-01-05 DIAGNOSIS — J441 Chronic obstructive pulmonary disease with (acute) exacerbation: Secondary | ICD-10-CM

## 2016-01-05 DIAGNOSIS — I5033 Acute on chronic diastolic (congestive) heart failure: Secondary | ICD-10-CM

## 2016-01-05 DIAGNOSIS — Z515 Encounter for palliative care: Secondary | ICD-10-CM

## 2016-01-05 DIAGNOSIS — R410 Disorientation, unspecified: Secondary | ICD-10-CM

## 2016-01-05 DIAGNOSIS — R443 Hallucinations, unspecified: Secondary | ICD-10-CM

## 2016-01-05 DIAGNOSIS — N179 Acute kidney failure, unspecified: Secondary | ICD-10-CM

## 2016-01-05 DIAGNOSIS — N184 Chronic kidney disease, stage 4 (severe): Secondary | ICD-10-CM

## 2016-01-05 DIAGNOSIS — R1310 Dysphagia, unspecified: Secondary | ICD-10-CM

## 2016-01-05 DIAGNOSIS — R0689 Other abnormalities of breathing: Secondary | ICD-10-CM

## 2016-01-05 DIAGNOSIS — E785 Hyperlipidemia, unspecified: Secondary | ICD-10-CM

## 2016-01-05 LAB — BASIC METABOLIC PANEL
Anion gap: 5 (ref 5–15)
BUN: 69 mg/dL — AB (ref 6–20)
CALCIUM: 8.8 mg/dL — AB (ref 8.9–10.3)
CO2: 32 mmol/L (ref 22–32)
CREATININE: 2.08 mg/dL — AB (ref 0.44–1.00)
Chloride: 105 mmol/L (ref 101–111)
GFR calc Af Amer: 25 mL/min — ABNORMAL LOW (ref >60)
GFR, EST NON AFRICAN AMERICAN: 22 mL/min — AB (ref >60)
GLUCOSE: 285 mg/dL — AB (ref 65–99)
Potassium: 4.8 mmol/L (ref 3.5–5.1)
Sodium: 142 mmol/L (ref 135–145)

## 2016-01-05 LAB — GLUCOSE, CAPILLARY
GLUCOSE-CAPILLARY: 291 mg/dL — AB (ref 65–99)
Glucose-Capillary: 238 mg/dL — ABNORMAL HIGH (ref 65–99)
Glucose-Capillary: 196 mg/dL — ABNORMAL HIGH (ref 65–99)
Glucose-Capillary: 333 mg/dL — ABNORMAL HIGH (ref 65–99)

## 2016-01-05 LAB — CULTURE, BLOOD (ROUTINE X 2)
CULTURE: NO GROWTH
CULTURE: NO GROWTH

## 2016-01-05 MED ORDER — METOPROLOL TARTRATE 25 MG PO TABS
12.5000 mg | ORAL_TABLET | Freq: Two times a day (BID) | ORAL | Status: DC
  Administered 2016-01-05 – 2016-01-06 (×3): 12.5 mg via ORAL
  Filled 2016-01-05 (×3): qty 1

## 2016-01-05 MED ORDER — PREDNISONE 20 MG PO TABS
20.0000 mg | ORAL_TABLET | Freq: Every day | ORAL | Status: DC
  Administered 2016-01-06: 20 mg via ORAL
  Filled 2016-01-05: qty 1

## 2016-01-05 NOTE — Progress Notes (Signed)
CSW was informed by MD Imogene Burnhen that patient will possibly discharge tomorrow 01/06/16. [facility] to inform him of above and to ensure that there is a bed available. Awaiting phone call back. CSW will continue to follow and assist.   Woodroe Modehristina Shantil Vallejo, MSW, LCSW-A Clinical Social Work Department [phone]

## 2016-01-05 NOTE — Plan of Care (Signed)
Problem: Education: Goal: Knowledge of [facility] Education information/materials will improve Outcome: Completed/Met Date Met:  01/05/16 Educated family  Problem: Health Behavior/Discharge Planning: Goal: Ability to manage health-related needs will improve Outcome: Completed/Met Date Met:  01/05/16 Pt will discharge to SNF

## 2016-01-05 NOTE — Progress Notes (Signed)
Inpatient Diabetes Program Recommendations  AACE/ADA: New Consensus Statement on Inpatient Glycemic Control (2015)  Target Ranges:  Prepandial:   less than 140 mg/dL      Peak postprandial:   less than 180 mg/dL (1-2 hours)      Critically ill patients:  140 - 180 mg/dL   Review of Glycemic Control:  Results for Allison Shepard, [name] (MRN [id number]) as of 01/05/2016 10:56  Ref. Range 01/04/2016 07:36 01/04/2016 11:30 01/04/2016 16:47 01/04/2016 21:21 01/05/2016 07:23  Glucose-Capillary Latest Ref Range: 65-99 mg/dL 782198 (H) 956236 (H) 213361 (H) 387 (H) 238 (H)    Diabetes history: Type 2 diabetes Outpatient Diabetes medications: Lantus  26 units q AM, and Novolog 8-12 units tid with meals Current orders for Inpatient glycemic control:  Lantus 30 units q AM, Novolog 4 units tid with meals, Novolog moderate tid with meals and HS  Inpatient Diabetes Program Recommendations:    Please consider increasing Lantus to 36 units q AM while patient is on PO steroids.    Thanks, Beryl MeagerJenny Tajanay Hurley, RN, BC-ADM Inpatient Diabetes Coordinator Pager [phone] (8a-5p)

## 2016-01-05 NOTE — Progress Notes (Addendum)
[facility] - [place] at [facility]   PATIENT NAME: Allison Shepard    MR#:  [id number]  DATE OF BIRTH:  1934-02-22  SUBJECTIVE:  CHIEF COMPLAINT:   Chief Complaint  Patient presents with  . Respiratory Distress   On O2 [place] 4 L. better cough, shortness of breath and wheezing. REVIEW OF SYSTEMS:    CONSTITUTIONAL: No fever, Generalized weakness.  EYES: No blurred or double vision.  EARS, NOSE, AND THROAT: No tinnitus or ear pain.  RESPIRATORY: has cough, shortness of breath and wheezing but no hemoptysis.  CARDIOVASCULAR: No chest pain, orthopnea, edema.  GASTROINTESTINAL: No nausea, vomiting, diarrhea or abdominal pain.  GENITOURINARY: No dysuria, hematuria.  ENDOCRINE: No polyuria, nocturia,  HEMATOLOGY: No anemia, easy bruising or bleeding SKIN: No rash or lesion. MUSCULOSKELETAL: No joint pain or arthritis.  NEUROLOGIC: No tingling, numbness, weakness.  PSYCHIATRY: No anxiety or depression.   DRUG ALLERGIES:   Allergies  Allergen Reactions  . Hydrocodone-Chlorpheniramine Other (See Comments)    Confusion  . Hydromorphone Hcl Nausea And Vomiting  . Ibuprofen Other (See Comments)    Pt was told by her MD not to take this medication.    . Mucinex [Guaifenesin Er] Other (See Comments)    Red all over. Felt like i was on fire  . Percocet [Oxycodone-Acetaminophen] Hives    VITALS:  Blood pressure 112/82, pulse 74, temperature 98.8 F (37.1 C), temperature source Oral, resp. rate 19, height  (1.575 m), weight 119.523 kg (263 lb 8 oz), SpO2 96 %.  PHYSICAL EXAMINATION:   Physical Exam  GENERAL:  82 y.o.-year-old patient lying in the bed with no acute distress. Morbid obese. EYES: Pupils equal, round, reactive to light and accommodation. No scleral icterus. Extraocular muscles intact.  HEENT: Head atraumatic, normocephalic. Oropharynx and nasopharynx clear.  NECK:  Supple, no jugular venous distention. No thyroid enlargement,  no tenderness.  LUNGS: Normal breath sounds bilaterally, mild wheezing and no rhonchi. No use of accessory muscles of respiration.  CARDIOVASCULAR: S1, S2 normal. No murmurs, rubs, or gallops.  ABDOMEN: Soft, nontender, nondistended. Bowel sounds present. No organomegaly or mass.  EXTREMITIES: No cyanosis, clubbing or edema b/l.    NEUROLOGIC: Cranial nerves II through XII are intact. No focal Motor or sensory deficits b/l.   PSYCHIATRIC: The patient is alert and oriented x 3.  SKIN: No obvious rash, lesion, or ulcer.   LABORATORY PANEL:   CBC  Recent Labs Lab 01/02/16 0657  WBC 6.3  HGB 8.7*  HCT 28.9*  PLT 253   ------------------------------------------------------------------------------------------------------------------ Chemistries   Recent Labs Lab 12/31/15 1039  01/04/16 0430 01/05/16 0508  NA 140  < > 141 142  K 5.5*  < > 4.5 4.8  CL 106  < > 106 105  CO2 27  < > 28 32  GLUCOSE 130*  < > 268* 285*  BUN 55*  < > 79* 69*  CREATININE 2.14*  < > 2.29* 2.08*  CALCIUM 8.6*  < > 8.9 8.8*  MG  --   --  2.8*  --   AST 32  --   --   --   ALT 37  --   --   --   ALKPHOS 150*  --   --   --   BILITOT 1.4*  --   --   --   < > = values in this interval not displayed. ------------------------------------------------------------------------------------------------------------------  Cardiac Enzymes  Recent Labs Lab 12/31/15 1039  TROPONINI <0.03   ------------------------------------------------------------------------------------------------------------------  RADIOLOGY:  No results found.   ASSESSMENT AND PLAN:   Allison Shepard is a 82 y.o. female with a known history ofCOPD, chronic respiratory failure on 2 L nasal canal oxygen, morbid obesity, chronic atrial fibrillation off anticoagulation secondary to history of GI bleed, type 2 diabetes, hyperlipidemia admitted for SOB  1. Acute on chronic hypoxic respiratory failure with hypoxia.  appears to be due  to congestive heart failure and COPD exacerbation Weaned down to O2 [place] 2L but back to 4L.  scheduled nebulized steroids and bronchodilators. Robitussin prn.   * Acute diastolic congestive heart failure -  EF of 55% with moderate MR and TR Changed to po Lasix, Beta blockers - Input and Output - Counseled to limit fluids and Salt - Monitor Bun/Cr and Potassium -Cardiology follow up after discharge  2. COPD exacerbation taper po prednisone, on augmentin. - Scheduled Nebulizers - Inhalers -Wean O2 as tolerated Per Dr. Sung AmabileSimonds, poor prognosis, needs palliative care consult.  * Aspiration PNA. Changed to augmentin.  Robitussin prn. Aspiration precaution.  *Acute Renal Failure on Chronic Kidney Disease stage IV with proteinuria, improving. Likely cardiorenal syndrome. Monitor while on diuresis. holding olmesartan, Renally dose all medications, avoid nephrotoxic agents per Dr. Wynelle LinkKolluru.  3. Hypertension continue home meds  4. Type 2 diabetes sliding scale insulin and increased lantus from 26 to 30 unit daily. novolog 4 units tid ac.  5 Ongoing outpatient treatment for UTI, on Keflex. Repeat UA: negative.  6. Hyperlipidemia continue statins  7. Hyperkalemia Improved. Hold off on by mouth potassium  8. DVT prophylaxis SCD teds Recent evaluation for GI bleed.  9. Weakness. physical therapy: SNF.  Palliative care consult.  disussed with Dr. Orvan Falconerampbell and Dr. Cherylann RatelLateef.  All the records are reviewed and case discussed with Care Management/Social Workerr. Management plans discussed with the patient, her husband and they are in agreement.  CODE STATUS: FULL CODE  DVT Prophylaxis: SCDs  TOTAL TIME TAKING CARE OF THIS PATIENT: 37 minutes.   POSSIBLE D/C SNF IN 1-2 DAYS, DEPENDING ON CLINICAL CONDITION.  Shaune Pollackhen, Breean Nannini M.D on 01/05/2016 at 2:31 PM  Between 7am to 6pm - Pager - [phone]  After 6pm go to [email or web address] - password [email or web address] [facility]  [facility]  Office   [phone]  CC: Primary care physician; Mickey FarberHIES, DAVID, MD  Note: This dictation was prepared with Dragon dictation along with smaller phrase technology. Any transcriptional errors that result from this process are unintentional.

## 2016-01-05 NOTE — Progress Notes (Signed)
Physical Therapy Treatment Patient Details Name: Allison HeadsMargaret M Shepard MRN: [id number] DOB: [birth year] Today's Date: 01/05/2016    History of Present Illness 82 yo female with onset of CHF and edema all over was admitted, has recent UTI admission.  PMHx:  COPD, EF55%, chronic O2 use, UTI,    PT Comments    Pt was able to demonstrate progress during today's session. She participation was improved during this mornings session versus yesterday's afternoon session in terms of alertness. Pt required min guard for bed mobility. Improved independence with transfers requiring min A +2 for safety/equipment. She was able to ambulate up to 10 ft with FWW and min A +2 for safety/equipment and chair follow incase of knee buckling. Pt had no LOB, but was highly impulsive requiring constant cues for safety. Plan to progress mobility as appropriate.  Follow Up Recommendations  SNF     Equipment Recommendations  None recommended by PT    Recommendations for Other Services Rehab consult     Precautions / Restrictions Precautions Precautions: Fall Restrictions Weight Bearing Restrictions: No Other Position/Activity Restrictions: sit upright for meals     Mobility  Bed Mobility Overal bed mobility: Needs Assistance Bed Mobility: Supine to Sit     Supine to sit: Min guard;HOB elevated     General bed mobility comments: uses rail  Transfers Overall transfer level: Needs assistance Equipment used: Rolling walker (2 wheeled) Transfers: Sit to/from [employer];Stand Pivot Transfers Sit to Stand: Min assist;+2 safety/equipment;From elevated surface Stand pivot transfers: Min assist;+2 safety/equipment;From elevated surface       General transfer comment: cues for hand placement, anterior weight shift and knee extension to stand upright; knees didn't buckle during this session  Ambulation/Gait Ambulation/Gait assistance: Min assist;+2 safety/equipment Ambulation Distance (Feet): 10 Feet Assistive  device: Rolling walker (2 wheeled) Gait Pattern/deviations: Decreased stride length;Shuffle;Trunk flexed;Narrow base of support Gait velocity: reduced   General Gait Details: cues for staying inside FWW, pt impulsive and requires cues for safety   Stairs            Wheelchair Mobility    Modified Rankin (Stroke Patients Only)       Balance Overall balance assessment: Needs assistance Sitting-balance support: Bilateral upper extremity supported;Feet supported Sitting balance-Leahy Scale: Fair Sitting balance - Comments: maintains independently   Standing balance support: Bilateral upper extremity supported Standing balance-Leahy Scale: Fair Standing balance comment: improved from yesterday's session                    Cognition Arousal/Alertness: Awake/alert Behavior During Therapy: WFL for tasks assessed/performed Overall Cognitive Status: Impaired/Different from baseline Area of Impairment: Orientation;Attention;Memory;Following commands;Awareness   Current Attention Level: Selective;Alternating Memory: Decreased short-term memory Following Commands: Follows one step commands inconsistently       General Comments: Pt repeats herself often throughout session; calls for her husband frequently    Exercises Other Exercises Other Exercises: B LE therex:supine: ankle pumps, QS, GS, heel slides, hip abd slides, seated: LAQs  x15 each. Cues for proper technique and staying on task. Other Exercises: Pt ambulated 10 ft with min A +2 for equipment/safety with FWW. Pt was impulsive and required cues for safety and staying inside FWW. Chair kept close incase of knee buckling.    General Comments        Pertinent Vitals/Pain Pain Assessment: No/denies pain    Home Living                      Prior Function  PT Goals (current goals can now be found in the care plan section) Acute Rehab PT Goals Patient Stated Goal: to get her breathing to  feel better PT Goal Formulation: With patient Time For Goal Achievement: 01/16/16 Potential to Achieve Goals: Good Progress towards PT goals: Progressing toward goals    Frequency  Min 2X/week    PT Plan Current plan remains appropriate    Co-evaluation             End of Session Equipment Utilized During Treatment: Gait belt;Oxygen Activity Tolerance: Patient tolerated treatment well Patient left: in chair;with call bell/phone within reach;with chair alarm set     Time: 1020-1043 PT Time Calculation (min) (ACUTE ONLY): 23 min  Charges:  $Gait Training: 8-22 mins $Therapeutic Exercise: 8-22 mins                    G Codes:      Adelene Idler, PT, DPT  01/05/2016, 10:59 AM [phone]

## 2016-01-05 NOTE — Progress Notes (Signed)
Per MD Imogene Burnhen, do not order any anti anxiety or anti psychotics.  Pt does not need to be sedated, she is to discharge tomorrow

## 2016-01-05 NOTE — Progress Notes (Signed)
Per MD Imogene Burnhen, ok to DC telemetry monitor, it agitates patient

## 2016-01-05 NOTE — Care Management (Signed)
[facility] It is anticipated that patient will dc tomorrow.

## 2016-01-05 NOTE — Consult Note (Signed)
Palliative Medicine Inpatient Consult Note   Name: Allison Shepard Date: 01/05/2016 MRN: [id number]  DOB: April 18, 1934  Referring Physician: Shaune Pollack, MD  Palliative Care consult requested for this 82 y.o. female for goals of medical therapy in patient with COPD and Obesity Hypoventilation Syndrome.    DISCUSSIONS AND PLAN: Allison Shepard is pt's husband [phone]).  Duaghter is Allison Shepard at [phone].  Pt lives in [place].  Pt is supposed to go to [employer] tomorrow apparently.    I attempted to call pts husband, Allison Shepard, and left a message on the machine.  I will try to call again in the am.  If she goes to SNF, perhaps her delerium will settle down over time. It does seem to be a component of delerium though she may have an underlying dementia as well.  She has a slight elevation of total bilirubin recently, so i would recommend checking an ammonia level and rechecking LFTs (I have ordered these).  I strongly recommend a PALLIATIVE CARE CONSULT while she is at SNF for rehab b/c her prognosis is quite poor given her problems with her lungs, her heart, her kidneys, and of course, her mind.  I would like to discuss code status but did not reach pts husband by phone and he is not here and pt is completely unable to make decisions given her delerium.   --------------------------------------------------------------- CLINICAL NARRATIVE: Pt is 82 yo woman who came to the ED c/o worsening shortness of breath and difficulty getting around the home.  She was hypoxic on her usual 2 liters of O2.  Sats were in the 80's. She had been seen in the ED the day prior about 10 hrs following a fall which resulted in pain in the left knee.  She was diagnosed with a UTI, given an RX for Keflex, and sent home. During that ED visit, the husband stated that the pt is having more DELUSIONS and HALLUCINATIONS for months. She was found to have en elevated BNP and s/s of CHF and was admitted with  diagnosis of CHF.  Dr Lady Gary is her cardiologist.   She has continued to have some confusion and hallucinations here.  She had a transient choking episode while eating breakfast on 5/20.  She has continued to have wheezing and has had occasional productive coughing. Aspiration was diagnosed.  Now she cannot swallow pills. SLP evaluated pt and ordered Dysphagia 1 with honey thik Liquids and strict aspiration precautions as well as feeding assistance and monitoring.  All meds to be crushed with puree.   Dr Sung Amabile saw pt on 5/21 and he discussed having scheduled nebs and steroids ordered and also a palliative care consultation.   -----------------------------------------------------------------------  ACTIVE PROBLEMS: Copd with exacerbation Acute on chronic resp failure with hypoxia  ---on 2 LPM [place] O2 at home ---due to CHF and COPD exac H/O Pneumococcal Pneumonia in Dec 2016 (seen by Dr Sung Amabile as outpt) Morbid Obesity Chronic Afib --43ff anticoagulation due to h/o GI bleeding (had been taken of Xarelto 6mos ago) Recent GI bleeding ---had EGD at [facility] on 12/08/15 --it showed GASTRITIS DM2 on insulin HLD Recent UTI --but urine is negative on 5/18 and cx is negative from 5/17.   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   ------------------------------------------------------------------------------  PAST MEDICAL HISTORY: Past Medical History  Diagnosis Date  . COPD (chronic obstructive pulmonary disease) (HCC)   .  Atrial fibrillation (HCC)   . Hypertension   . Hypercholesteremia   . Pneumonia   . Diabetes mellitus without complication (HCC)     type 2  . Headache     migraines - years ago  . Shortness of breath dyspnea   . Asthma   . Dysrhythmia     afib  . Chronic kidney disease     history of insufficiency  . Arthritis   . Spinal disorder     stenosis  . Complication of anesthesia     difficulty to wake up after an 8 hour back surgery  . Edema     legs/feet  . Wheezing     PAST SURGICAL HISTORY:  Past Surgical History  Procedure Laterality Date  . Abdominal hysterectomy    . Shoulder fusion surgery Right   . Total knee arthroplasty Right     knee replacement  . Total hip arthroplasty Right     hip replacement  . Back surgery    . Cataract extraction      cataract surgery ? eye  . Fracture surgery Right     right arm 20 years ago  . Colonoscopy      x 3  . Orif distal radius fracture Left 06/17/2015  . Open reduction internal fixation (orif) distal radial fracture Left 06/17/2015    Procedure: OPEN REDUCTION INTERNAL FIXATION LEFT  DISTAL RADIUS AND ULNA FRACTURES;  Surgeon: Tarry Kos, MD;  Location: [facility] OR;  Service: Orthopedics;  Laterality: Left;  . Joint replacement    . Hernia repair    . Cataract extraction w/phaco Left 07/28/2015    Procedure: CATARACT EXTRACTION PHACO AND INTRAOCULAR LENS PLACEMENT (IOC);  Surgeon: Galen Manila, MD;  Location: [facility] ORS;  Service: Ophthalmology;  Laterality: Left;  [place] 2:16   . Esophagogastroduodenoscopy (egd) with propofol N/A 12/24/2015    Procedure: ESOPHAGOGASTRODUODENOSCOPY (EGD) WITH PROPOFOL;  Surgeon: [name], MD;  Location: [facility] ENDOSCOPY;  Service: Endoscopy;  Laterality: N/A;   SOCIAL HISTORY:  reports that she quit smoking about 23 years ago. She has never used smokeless tobacco. She reports that she drinks daily alcohol --but not more than 2 drinks/ day.   She reports that she does not use illicit  drugs.  ALLERGIES:  is allergic to hydrocodone-chlorpheniramine; hydromorphone hcl; ibuprofen; mucinex; and percocet.  MEDICATIONS:  Current Facility-Administered Medications  Medication Dose Route Frequency Provider Last Rate Last Dose  . acetaminophen (TYLENOL) tablet 650 mg  650 mg Oral Q6H PRN Enedina Finner, MD   650 mg at 01/04/16 1030   Or  . acetaminophen (TYLENOL) suppository 650 mg  650 mg Rectal Q6H PRN Enedina Finner, MD      . albuterol (PROVENTIL) (2.5 MG/3ML) 0.083% nebulizer solution 2.5 mg  2.5 mg Nebulization Q3H PRN Merwyn Katos, MD   2.5 mg at 01/04/16 1029  . amoxicillin-clavulanate (AUGMENTIN) 500-125 MG per tablet 500 mg  1 tablet Oral BID Shaune Pollack, MD   500 mg at 01/05/16 1012  . aspirin EC tablet 81 mg  81 mg Oral Daily Enedina Finner, MD   81 mg at 01/05/16 1012  . bisacodyl (  DULCOLAX) suppository 10 mg  10 mg Rectal Daily PRN Enedina FinnerSona Patel, MD      . budesonide (PULMICORT) nebulizer solution 0.25 mg  0.25 mg Nebulization Q6H Merwyn Katosavid B Simonds, MD   0.25 mg at 01/05/16 1433  . calcium carbonate (TUMS - dosed in mg elemental calcium) chewable tablet 300 mg of elemental calcium  1.5 tablet Oral Daily Enedina FinnerSona Patel, MD   300 mg of elemental calcium at 01/05/16 1013  . cholecalciferol (VITAMIN D) tablet 1,000 Units  1,000 Units Oral Daily Enedina FinnerSona Patel, MD   1,000 Units at 01/05/16 1012  . diltiazem (CARDIZEM CD) 24 hr capsule 180 mg  180 mg Oral Daily Shaune PollackQing Chen, MD   180 mg at 01/05/16 1012  . furosemide (LASIX) tablet 20 mg  20 mg Oral Daily Shaune PollackQing Chen, MD   20 mg at 01/05/16 1012  . guaiFENesin (ROBITUSSIN) 100 MG/5ML solution 100 mg  5 mL Oral Q4H PRN Shaune PollackQing Chen, MD   100 mg at 01/04/16 1758  . insulin aspart (novoLOG) injection 0-15 Units  0-15 Units Subcutaneous TID WC Milagros LollSrikar Sudini, MD   3 Units at 01/05/16 1250  . insulin aspart (novoLOG) injection 0-5 Units  0-5 Units Subcutaneous QHS Milagros LollSrikar Sudini, MD   5 Units at 01/04/16 2232  . insulin aspart (novoLOG) injection 4 Units  4 Units  Subcutaneous TID WC Shaune PollackQing Chen, MD   4 Units at 01/05/16 1251  . insulin glargine (LANTUS) injection 30 Units  30 Units Subcutaneous q morning - 10a Shaune PollackQing Chen, MD   30 Units at 01/05/16 1011  . ipratropium-albuterol (DUONEB) 0.5-2.5 (3) MG/3ML nebulizer solution 3 mL  3 mL Nebulization Q6H Merwyn Katosavid B Simonds, MD   3 mL at 01/05/16 1433  . levothyroxine (SYNTHROID, LEVOTHROID) tablet 100 mcg  100 mcg Oral QAC breakfast Enedina FinnerSona Patel, MD   100 mcg at 01/05/16 0813  . metoprolol tartrate (LOPRESSOR) tablet 12.5 mg  12.5 mg Oral BID Shaune PollackQing Chen, MD   12.5 mg at 01/05/16 1251  . ondansetron (ZOFRAN) tablet 4 mg  4 mg Oral BID Enedina FinnerSona Patel, MD   4 mg at 01/05/16 1012  . pantoprazole (PROTONIX) EC tablet 40 mg  40 mg Oral Daily Enedina FinnerSona Patel, MD   40 mg at 01/05/16 1012  . [START ON 01/06/2016] predniSONE (DELTASONE) tablet 20 mg  20 mg Oral Q breakfast Shaune PollackQing Chen, MD      . senna-docusate (Senokot-S) tablet 1 tablet  1 tablet Oral QHS PRN Enedina FinnerSona Patel, MD      . simvastatin (ZOCOR) tablet 10 mg  10 mg Oral Daily Enedina FinnerSona Patel, MD   10 mg at 01/05/16 1012  . zolpidem (AMBIEN) tablet 5 mg  5 mg Oral QHS PRN Enedina FinnerSona Patel, MD   5 mg at 01/02/16 2239   REVIEW OF SYSTEMS:  Less short of breath on 3-4 L O2 via [place]  Vital Signs: BP 112/82 mmHg  Pulse 74  Temp(Src) 98.8 F (37.1 C) (Oral)  Resp 19  Ht 5\' 2"  (1.575 m)  Wt 119.523 kg (263 lb 8 oz)  BMI 48.18 kg/m2  SpO2 96% Filed Weights   12/31/15 1013 12/31/15 1706  Weight: 108.863 kg (240 lb) 119.523 kg (263 lb 8 oz)    Estimated body mass index is 48.18 kg/(m^2) as calculated from the following:   Height as of this encounter: 5\' 2"  (1.575 m).   Weight as of this encounter: 119.523 kg (263 lb 8 oz).  PERFORMANCE STATUS (ECOG) : 3 - Symptomatic, >  50% confined to bed  LEGAL DOCUMENTS:  none  CODE STATUS: Full code  PHYSICAL EXAM: Morbidly obese [place]idly confused --she is fiddling with her nasal cannula tubing as I walk in EOMI OP clear Full neck but NO JVD OR  TM Hrt irreg irreg Lungs cta upper anteriorly Abd morbidly obese and soft  Ext no mottling or cyanosis   LABS: CBC:    Component Value Date/Time   WBC 6.3 01/02/2016 0657   WBC 6.3 02/11/2014 0501   HGB 8.7* 01/02/2016 0657   HGB 14.6 02/11/2014 0501   HCT 28.9* 01/02/2016 0657   HCT 44.5 02/11/2014 0501   PLT 253 01/02/2016 0657   PLT 188 02/11/2014 0501   MCV 83.0 01/02/2016 0657   MCV 100 02/11/2014 0501   NEUTROABS 5.7 01/02/2016 0657   NEUTROABS 4.5 02/11/2014 0501   LYMPHSABS 0.3* 01/02/2016 0657   LYMPHSABS 1.2 02/11/2014 0501   MONOABS 0.4 01/02/2016 0657   MONOABS 0.4 02/11/2014 0501   EOSABS 0.0 01/02/2016 0657   EOSABS 0.2 02/11/2014 0501   BASOSABS 0.0 01/02/2016 0657   BASOSABS 0.1 02/11/2014 0501   Comprehensive Metabolic Panel:    Component Value Date/Time   NA 142 01/05/2016 0508   NA 140 02/11/2014 0501   K 4.8 01/05/2016 0508   K 3.5 02/11/2014 0501   CL 105 01/05/2016 0508   CL 107 02/11/2014 0501   CO2 32 01/05/2016 0508   CO2 26 02/11/2014 0501   BUN 69* 01/05/2016 0508   BUN 35* 02/11/2014 0501   CREATININE 2.08* 01/05/2016 0508   CREATININE 1.23 02/11/2014 0501   GLUCOSE 285* 01/05/2016 0508   GLUCOSE 193* 02/11/2014 0501   CALCIUM 8.8* 01/05/2016 0508   CALCIUM 8.8 02/11/2014 0501   AST 32 12/31/2015 1039   AST 55* 02/11/2014 0501   ALT 37 12/31/2015 1039   ALT 94* 02/11/2014 0501   ALKPHOS 150* 12/31/2015 1039   ALKPHOS 74 02/11/2014 0501   BILITOT 1.4* 12/31/2015 1039   BILITOT 0.9 02/11/2014 0501   PROT 7.0 12/31/2015 1039   PROT 6.4 02/11/2014 0501   ALBUMIN 3.5 12/31/2015 1039   ALBUMIN 2.9* 02/11/2014 0501    More than 50% of the visit was spent in counseling/coordination of care: Yes  Time Spent: 55 minutes

## 2016-01-05 NOTE — Progress Notes (Signed)
[facility]  ROUNDING NOTE   Subjective:  Creatinine down to 2.08. Patient still having some hallucinations per her husband. Will need to be placed in a rehabilitation facility.  Objective:  Vital signs in last 24 hours:  Temp:  [98.4 F (36.9 C)-98.8 F (37.1 C)] 98.8 F (37.1 C) (05/23 1134) Pulse Rate:  [43-74] 74 (05/23 1134) Resp:  [19-24] 19 (05/23 1134) BP: (112-123)/(63-82) 112/82 mmHg (05/23 1134) SpO2:  [92 %-96 %] 96 % (05/23 1134)  Weight change:  Filed Weights   12/31/15 1013 12/31/15 1706  Weight: 108.863 kg (240 lb) 119.523 kg (263 lb 8 oz)    Intake/Output: I/O last 3 completed shifts: In: 240 [P.O.:240] Out: 0    Intake/Output this shift:  Total I/O In: 240 [P.O.:240] Out: 301 [Urine:300; Stool:1]  Physical Exam: General: NAD, laying in bed  Head: Normocephalic, atraumatic. Moist oral mucosal membranes  Eyes: Anicteric  Neck: Supple, trachea midline  Lungs:  Bilateral rhonchi, normal effort  Heart: Irregular no rubs  Abdomen:  Soft, nontender, obese  Extremities:  1+ peripheral and dependent edema.  Neurologic: Continues to have intermittent confusion, lethargic this AM  Skin: No lesions       Basic Metabolic Panel:  Recent Labs Lab 01/01/16 0418 01/02/16 0657 01/03/16 0603 01/04/16 0430 01/05/16 0508  NA 140 138 139 141 142  K 5.3* 4.6 4.6 4.5 4.8  CL 106 103 103 106 105  CO2 _0 32  GLUCOSE 222* 198* 241* 268* 285*  BUN 57* 75* 83* 79* 69*  CREATININE 2.29* 2.49* 2.54* 2.29* 2.08*  CALCIUM 8.5* 8.4* 8.3* 8.9 8.8*  MG  --   --   --  2.8*  --     Liver Function Tests:  Recent Labs Lab 12/30/15 1308 12/31/15 1039  AST 26 32  ALT 35 37  ALKPHOS 153* 150*  BILITOT 1.2 1.4*  PROT 6.7 7.0  ALBUMIN 3.6 3.5   No results for input(s): LIPASE, AMYLASE in the last 168 hours. No results for input(s): AMMONIA in the last 168 hours.  CBC:  Recent Labs Lab 12/30/15 1308 12/31/15 1039 01/02/16 0657   WBC 5.1 4.6 6.3  NEUTROABS  --  3.6 5.7  HGB 9.2* 9.3* 8.7*  HCT 30.3* 30.6* 28.9*  MCV 82.2 82.7 83.0  PLT 248 246 253    Cardiac Enzymes:  Recent Labs Lab 12/31/15 1039  TROPONINI <0.03    BNP: Invalid input(s): POCBNP  CBG:  Recent Labs Lab 01/04/16 1130 01/04/16 1647 01/04/16 2121 01/05/16 0723 01/05/16 1131  GLUCAP 236* 361* 387* 238* 196*    Microbiology: Results for orders placed or performed during the hospital encounter of 12/31/15  Blood Culture (routine x 2)     Status: None   Collection Time: 12/31/15 10:39 AM  Result Value Ref Range Status   Specimen Description BLOOD LEFT AC  Final   Special Requests   Final    BOTTLES DRAWN AEROBIC AND ANAEROBIC AER 4ML ANA 3ML   Culture NO GROWTH 5 DAYS  Final   Report Status 01/05/2016 FINAL  Final  Blood Culture (routine x 2)     Status: None   Collection Time: 12/31/15 10:39 AM  Result Value Ref Range Status   Specimen Description BLOOD LEFT HAND  Final   Special Requests BOTTLES DRAWN AEROBIC AND ANAEROBIC .5ML  Final   Culture NO GROWTH 5 DAYS  Final   Report Status 01/05/2016 FINAL  Final  Urine culture  Status: None   Collection Time: 12/31/15 10:39 AM  Result Value Ref Range Status   Specimen Description URINE, RANDOM  Final   Special Requests NONE  Final   Culture NO GROWTH Performed at [facility]   Final   Report Status 01/01/2016 FINAL  Final    Coagulation Studies: No results for input(s): LABPROT, INR in the last 72 hours.  Urinalysis: No results for input(s): COLORURINE, LABSPEC, PHURINE, GLUCOSEU, HGBUR, BILIRUBINUR, KETONESUR, PROTEINUR, UROBILINOGEN, NITRITE, LEUKOCYTESUR in the last 72 hours.  Invalid input(s): APPERANCEUR    Imaging: No results found.   Medications:     . amoxicillin-clavulanate  1 tablet Oral BID  . aspirin EC  81 mg Oral Daily  . budesonide (PULMICORT) nebulizer solution  0.25 mg Nebulization Q6H  . calcium carbonate  1.5 tablet Oral Daily   . cholecalciferol  1,000 Units Oral Daily  . diltiazem  180 mg Oral Daily  . furosemide  20 mg Oral Daily  . insulin aspart  0-15 Units Subcutaneous TID WC  . insulin aspart  0-5 Units Subcutaneous QHS  . insulin aspart  4 Units Subcutaneous TID WC  . insulin glargine  30 Units Subcutaneous q morning - 10a  . ipratropium-albuterol  3 mL Nebulization Q6H  . levothyroxine  100 mcg Oral QAC breakfast  . metoprolol tartrate  12.5 mg Oral BID  . ondansetron  4 mg Oral BID  . pantoprazole  40 mg Oral Daily  . predniSONE  40 mg Oral Q breakfast  . simvastatin  10 mg Oral Daily   acetaminophen **OR** acetaminophen, albuterol, bisacodyl, guaiFENesin, senna-docusate, zolpidem  Assessment/ Plan:  Ms Nordmeyer is a 82 year old white female with atrial fibrillation, hypertension, COPD, osteoarthritis, hyperlipidemia, osteopenia, allergic rhinitis, lumbar spinal stenosis presents as a follow up patient chronic kidney disease stage III with proteinuria.   1. Acute Renal Failure on Chronic Kidney Disease stage IV with proteinuria: Acute renal failure from hypoxia and acute cardio-renal syndrome.  Baseline creatinine of 1.8, eGFR of 27. 10/26/15. Chronic Kidney Disease secondary to diabetic nephropathy and hypertensive nephrosclerosis - Renal function has been slow to improve. BUN down to 69 with a creatinine of 2.08. Continue to monitor renal parameters daily for now.   2. Hypertension and Edema:   -  Blood pressure 112/82 this a.m.  Continue metoprolol, patient taken off of diltiazem   3. Secondary Hyperparathyroidism: Continue vitamin D 1000 international units by mouth daily.   4. Diabetes Mellitus type II with chronic kidney disease:  - Continue glucose control Per hospitalist.  5. COPD: now with aspiration.  - Continue prednisone, ipratropium/albuterol, and Pulmicort.    LOS: 5 Carlis Blanchard 5/23/20171:47 PM

## 2016-01-06 DIAGNOSIS — J441 Chronic obstructive pulmonary disease with (acute) exacerbation: Secondary | ICD-10-CM | POA: Insufficient documentation

## 2016-01-06 LAB — BASIC METABOLIC PANEL
ANION GAP: 5 (ref 5–15)
BUN: 63 mg/dL — ABNORMAL HIGH (ref 6–20)
CHLORIDE: 107 mmol/L (ref 101–111)
CO2: 32 mmol/L (ref 22–32)
Calcium: 9 mg/dL (ref 8.9–10.3)
Creatinine, Ser: 1.79 mg/dL — ABNORMAL HIGH (ref 0.44–1.00)
GFR calc non Af Amer: 26 mL/min — ABNORMAL LOW (ref >60)
GFR, EST AFRICAN AMERICAN: 30 mL/min — AB (ref >60)
Glucose, Bld: 180 mg/dL — ABNORMAL HIGH (ref 65–99)
POTASSIUM: 4.9 mmol/L (ref 3.5–5.1)
Sodium: 144 mmol/L (ref 135–145)

## 2016-01-06 LAB — HEPATIC FUNCTION PANEL
ALBUMIN: 3.7 g/dL (ref 3.5–5.0)
ALK PHOS: 122 U/L (ref 38–126)
ALT: 35 U/L (ref 14–54)
AST: 21 U/L (ref 15–41)
BILIRUBIN TOTAL: 1.2 mg/dL (ref 0.3–1.2)
Bilirubin, Direct: 0.4 mg/dL (ref 0.1–0.5)
Indirect Bilirubin: 0.8 mg/dL (ref 0.3–0.9)
Total Protein: 6.4 g/dL — ABNORMAL LOW (ref 6.5–8.1)

## 2016-01-06 LAB — AMMONIA: Ammonia: 26 umol/L (ref 9–35)

## 2016-01-06 LAB — GLUCOSE, CAPILLARY
GLUCOSE-CAPILLARY: 127 mg/dL — AB (ref 65–99)
GLUCOSE-CAPILLARY: 297 mg/dL — AB (ref 65–99)
Glucose-Capillary: 227 mg/dL — ABNORMAL HIGH (ref 65–99)

## 2016-01-06 MED ORDER — SENNOSIDES-DOCUSATE SODIUM 8.6-50 MG PO TABS: 1.0000 | ORAL_TABLET | Freq: Every evening | ORAL | Status: DC | PRN

## 2016-01-06 MED ORDER — INSULIN GLARGINE 100 UNIT/ML ~~LOC~~ SOLN: 30.0000 [IU] | Freq: Every morning | SUBCUTANEOUS | Status: DC

## 2016-01-06 MED ORDER — AMOXICILLIN-POT CLAVULANATE 500-125 MG PO TABS: 1.0000 | ORAL_TABLET | Freq: Two times a day (BID) | ORAL | Status: DC

## 2016-01-06 MED ORDER — FLUTICASONE-SALMETEROL 250-50 MCG/DOSE IN AEPB
1.0000 | INHALATION_SPRAY | Freq: Two times a day (BID) | RESPIRATORY_TRACT | Status: DC
Start: 2016-01-06 — End: 2016-03-11

## 2016-01-06 MED ORDER — GUAIFENESIN 100 MG/5ML PO SOLN: 5.0000 mL | ORAL | Status: DC | PRN

## 2016-01-06 MED ORDER — FUROSEMIDE 20 MG PO TABS: 20.0000 mg | ORAL_TABLET | Freq: Every day | ORAL | Status: DC

## 2016-01-06 MED ORDER — PREDNISONE 10 MG PO TABS: 10.0000 mg | ORAL_TABLET | Freq: Every day | ORAL | Status: DC

## 2016-01-06 MED ORDER — METOPROLOL TARTRATE 25 MG PO TABS: 12.5000 mg | ORAL_TABLET | Freq: Two times a day (BID) | ORAL | Status: DC

## 2016-01-06 NOTE — Discharge Summary (Signed)
[facility] - Register at [facility]   PATIENT NAME: Allison Shepard    MR#:  [id number]  DATE OF BIRTH:  December 09, 1933  DATE OF ADMISSION:  12/31/2015 ADMITTING PHYSICIAN: Auburn Bilberry, MD  DATE OF DISCHARGE: 01/06/2016  PRIMARY CARE PHYSICIAN: Mickey Farber, MD    ADMISSION DIAGNOSIS:  Hyperkalemia [E87.5] Acute respiratory failure with hypoxia (HCC) [J96.01] COPD with exacerbation (HCC) [J44.1]   DISCHARGE DIAGNOSIS:   Acute on chronic respiratory failure with hypoxia Acute diastolic congestive heart failure COPD exacerbation Aspiration PNA. Acute Renal Failure on Chronic Kidney Disease stage IV with proteinuria SECONDARY DIAGNOSIS:   Past Medical History  Diagnosis Date  . COPD (chronic obstructive pulmonary disease) (HCC)   . Atrial fibrillation (HCC)   . Hypertension   . Hypercholesteremia   . Pneumonia   . Diabetes mellitus without complication (HCC)     type 2  . Headache     migraines - years ago  . Shortness of breath dyspnea   . Asthma   . Dysrhythmia     afib  . Chronic kidney disease     history of insufficiency  . Arthritis   . Spinal disorder     stenosis  . Complication of anesthesia     difficulty to wake up after an 8 hour back surgery  . Edema     legs/feet  . Wheezing     HOSPITAL COURSE:   Allison Shepard is a 82 y.o. female with a known history ofCOPD, chronic respiratory failure on 2 L nasal canal oxygen, morbid obesity, chronic atrial fibrillation off anticoagulation secondary to history of GI bleed, type 2 diabetes, hyperlipidemia admitted for SOB  1. Acute on chronic respiratory failure with hypoxia. appears to be due to congestive heart failure and COPD exacerbation continue O2 [place] 3 L.Marland Kitchen scheduled nebulized steroids and bronchodilators. Robitussin prn.  * Acute diastolic congestive heart failure - EF of 55% with moderate MR and TR Changed to po Lasix, Beta blockers - Input and Output - Counseled  to limit fluids and Salt - Monitor Bun/Cr and Potassium -Cardiology follow up after discharge  2. COPD exacerbation taper po prednisone, on augmentin. - Scheduled Nebulizers - Inhalers continue O2 [place] 3L. Per Dr. Sung Amabile, poor prognosis, needs palliative care consult.  * Aspiration PNA. Changed to augmentin. Robitussin prn. Aspiration precaution.  *Acute Renal Failure on Chronic Kidney Disease stage IV with proteinuria, improving. Likely cardiorenal syndrome. Monitor while on diuresis. holding olmesartan, Renally dose all medications, avoid nephrotoxic agents per Dr. Wynelle Link.  3. Hypertension continue home meds  4. Type 2 diabetes sliding scale insulin and increased lantus from 26 to 30 unit daily. novolog 4 units tid ac.  5. She was on outpatient treatment for UTI, on Keflex. Repeat UA: negative.  6. Hyperlipidemia continue statins  7. Hyperkalemia Improved. Hold off on by mouth potassium  8. DVT prophylaxis SCD teds Recent evaluation for GI bleed.  9. Weakness. physical therapy: SNF.  Pt wants FULL CODE.  disussed with Dr. Orvan Falconer and Dr. Cherylann Ratel.  Palliative care consult in SNF in future per Dr. Orvan Falconer.  DISCHARGE CONDITIONS:   Stable, but poor prognosis. Discharge to SNF today.  CONSULTS OBTAINED:  Treatment Team:  Alwyn Pea, MD Lamont Dowdy, MD  DRUG ALLERGIES:   Allergies  Allergen Reactions  . Hydrocodone-Chlorpheniramine Other (See Comments)    Confusion  . Hydromorphone Hcl Nausea And Vomiting  . Ibuprofen Other (See Comments)    Pt was told by her MD not  to take this medication.    . Mucinex [Guaifenesin Er] Other (See Comments)    Red all over. Felt like i was on fire  . Percocet [Oxycodone-Acetaminophen] Hives    DISCHARGE MEDICATIONS:   Current Discharge Medication List    START taking these medications   Details  amoxicillin-clavulanate (AUGMENTIN) 500-125 MG tablet Take 1 tablet (500 mg total) by mouth 2 (two) times  daily. Qty: 6 tablet, Refills: 0    Fluticasone-Salmeterol (ADVAIR DISKUS) 250-50 MCG/DOSE AEPB Inhale 1 puff into the lungs 2 (two) times daily. Qty: 60 each, Refills: 0    guaiFENesin (ROBITUSSIN) 100 MG/5ML SOLN Take 5 mLs (100 mg total) by mouth every 4 (four) hours as needed for cough or to loosen phlegm. Qty: 118 mL, Refills: 0    metoprolol tartrate (LOPRESSOR) 25 MG tablet Take 0.5 tablets (12.5 mg total) by mouth 2 (two) times daily.    predniSONE (DELTASONE) 10 MG tablet Take 1 tablet (10 mg total) by mouth daily with breakfast. Qty: 3 tablet, Refills: 0    senna-docusate (SENOKOT-S) 8.6-50 MG tablet Take 1 tablet by mouth at bedtime as needed for mild constipation.      CONTINUE these medications which have CHANGED   Details  furosemide (LASIX) 20 MG tablet Take 1 tablet (20 mg total) by mouth daily. Qty: 30 tablet    insulin glargine (LANTUS) 100 UNIT/ML injection Inject 0.3 mLs (30 Units total) into the skin every morning. Qty: 10 mL, Refills: 3      CONTINUE these medications which have NOT CHANGED   Details  albuterol (PROVENTIL HFA;VENTOLIN HFA) 108 (90 BASE) MCG/ACT inhaler Inhale 2 puffs into the lungs every 6 (six) hours as needed for wheezing or shortness of breath.    albuterol (PROVENTIL) (2.5 MG/3ML) 0.083% nebulizer solution Take 2.5 mg by nebulization every 6 (six) hours as needed for wheezing or shortness of breath.    aspirin EC 81 MG tablet Take 81 mg by mouth daily.    calcium carbonate (TUMS EX) 750 MG chewable tablet Chew 1 tablet by mouth daily.    Cholecalciferol (VITAMIN D-3) 1000 UNITS CAPS Take 1,000 Units by mouth daily.    diltiazem (CARDIZEM CD) 180 MG 24 hr capsule Take 180 mg by mouth daily.     hydrocortisone valerate cream (WESTCORT) 0.2 % Apply 1 application topically daily.    insulin aspart (NOVOLOG) 100 UNIT/ML injection Inject 8-12 Units into the skin 3 (three) times daily with meals. Below 200=8 units and every 50 units above  she gets two extra units up to 12 units.    levothyroxine (SYNTHROID, LEVOTHROID) 100 MCG tablet Take 100 mcg by mouth daily before breakfast. Refills: 11    nystatin (MYCOSTATIN) powder Apply 1 g topically 2 (two) times daily as needed.    omeprazole (PRILOSEC) 20 MG capsule Take 20 mg by mouth daily.    ondansetron (ZOFRAN) 4 MG tablet Take 1 tablet by mouth 2 (two) times daily. Refills: 6    simvastatin (ZOCOR) 10 MG tablet Take 10 mg by mouth daily.    zolpidem (AMBIEN) 5 MG tablet Take 5 mg by mouth at bedtime as needed for sleep. Reported on 09/08/2015      STOP taking these medications     cephALEXin (KEFLEX) 500 MG capsule      meloxicam (MOBIC) 7.5 MG tablet      metoprolol succinate (TOPROL-XL) 100 MG 24 hr tablet      potassium chloride SA (K-DUR,KLOR-CON) 20 MEQ tablet  DISCHARGE INSTRUCTIONS:    If you experience worsening of your admission symptoms, develop shortness of breath, life threatening emergency, suicidal or homicidal thoughts you must seek medical attention immediately by calling 911 or calling your MD immediately  if symptoms less severe.  You Must read complete instructions/literature along with all the possible adverse reactions/side effects for all the Medicines you take and that have been prescribed to you. Take any new Medicines after you have completely understood and accept all the possible adverse reactions/side effects.   Please note  You were cared for by a hospitalist during your hospital stay. If you have any questions about your discharge medications or the care you received while you were in the hospital after you are discharged, you can call the unit and asked to speak with the hospitalist on call if the hospitalist that took care of you is not available. Once you are discharged, your primary care physician will handle any further medical issues. Please note that NO REFILLS for any discharge medications will be authorized once you  are discharged, as it is imperative that you return to your primary care physician (or establish a relationship with a primary care physician if you do not have one) for your aftercare needs so that they can reassess your need for medications and monitor your lab values.    Today   SUBJECTIVE   No complaint. On O2 [place] 3 L.   VITAL SIGNS:  Blood pressure 106/55, pulse 112, temperature 97.5 F (36.4 C), temperature source Oral, resp. rate 22, height 5\' 2"  (1.575 m), weight 119.523 kg (263 lb 8 oz), SpO2 95 %.  I/O:   Intake/Output Summary (Last 24 hours) at 01/06/16 1052 Last data filed at 01/05/16 1405  Gross per 24 hour  Intake    240 ml  Output      0 ml  Net    240 ml    PHYSICAL EXAMINATION:  GENERAL:  82 y.o.-year-old patient lying in the bed with no acute distress. Morbid obese. EYES: Pupils equal, round, reactive to light and accommodation. No scleral icterus. Extraocular muscles intact.  HEENT: Head atraumatic, normocephalic. Oropharynx and nasopharynx clear.  NECK:  Supple, no jugular venous distention. No thyroid enlargement, no tenderness.  LUNGS: Normal breath sounds bilaterally, tiny wheezing, no rales,rhonchi or crepitation. No use of accessory muscles of respiration.  CARDIOVASCULAR: S1, S2 normal. No murmurs, rubs, or gallops.  ABDOMEN: Soft, non-tender, non-distended. Bowel sounds present. No organomegaly or mass.  EXTREMITIES: Leg edema, no cyanosis, or clubbing.  NEUROLOGIC: Cranial nerves II through XII are intact. Muscle strength 4/5 in all extremities. Sensation intact. Gait not checked.  PSYCHIATRIC: The patient is alert and oriented x 3.  SKIN: No obvious rash, lesion, or ulcer.   DATA REVIEW:   CBC  Recent Labs Lab 01/02/16 0657  WBC 6.3  HGB 8.7*  HCT 28.9*  PLT 253    Chemistries   Recent Labs Lab 01/04/16 0430  01/06/16 0448  NA 141  < > 144  K 4.5  < > 4.9  CL 106  < > 107  CO2 28  < > 32  GLUCOSE 268*  < > 180*  BUN 79*  < >  63*  CREATININE 2.29*  < > 1.79*  CALCIUM 8.9  < > 9.0  MG 2.8*  --   --   AST  --   --  21  ALT  --   --  35  ALKPHOS  --   --  122  BILITOT  --   --  1.2  < > = values in this interval not displayed.  Cardiac Enzymes  Recent Labs Lab 12/31/15 1039  TROPONINI <0.03    Microbiology Results  Results for orders placed or performed during the hospital encounter of 12/31/15  Blood Culture (routine x 2)     Status: None   Collection Time: 12/31/15 10:39 AM  Result Value Ref Range Status   Specimen Description BLOOD LEFT AC  Final   Special Requests   Final    BOTTLES DRAWN AEROBIC AND ANAEROBIC AER 4ML ANA 3ML   Culture NO GROWTH 5 DAYS  Final   Report Status 01/05/2016 FINAL  Final  Blood Culture (routine x 2)     Status: None   Collection Time: 12/31/15 10:39 AM  Result Value Ref Range Status   Specimen Description BLOOD LEFT HAND  Final   Special Requests BOTTLES DRAWN AEROBIC AND ANAEROBIC .5ML  Final   Culture NO GROWTH 5 DAYS  Final   Report Status 01/05/2016 FINAL  Final  Urine culture     Status: None   Collection Time: 12/31/15 10:39 AM  Result Value Ref Range Status   Specimen Description URINE, RANDOM  Final   Special Requests NONE  Final   Culture NO GROWTH [facility]   Final   Report Status 01/01/2016 FINAL  Final    RADIOLOGY:  No results found.      Management plans discussed with the patient, her husband and they are in agreement.  CODE STATUS:     Code Status Orders        Start     Ordered   12/31/15 1712  Full code   Continuous     12/31/15 1711    Code Status History    Date Active Date Inactive Code Status Order ID Comments User Context   07/17/2015  5:28 PM 07/20/2015  5:46 PM Full Code [id number]  Katharina Caperima Vaickute, MD Inpatient   06/17/2015  5:27 PM 06/19/2015  5:50 PM Full Code [id number]  Tarry KosNaiping M Xu, MD Inpatient   04/06/2015 11:36 AM 04/14/2015  7:29 PM Full Code [id number]  Delfino LovettVipul Shah, MD Inpatient   03/16/2015  11:41 PM 03/20/2015  5:03 PM Full Code [id number]  Ramonita LabAruna Gouru, MD Inpatient      TOTAL TIME TAKING CARE OF THIS PATIENT: 38 minutes.    Shaune Pollackhen, Libero Puthoff M.D on 01/06/2016 at 10:52 AM  Between 7am to 6pm - Pager - [phone]  After 6pm go to [email or web address] - password [email or web address] [facility]  [facility]  Office  [phone]  CC: Primary care physician; Mickey FarberHIES, DAVID, MD

## 2016-01-06 NOTE — Progress Notes (Signed)
[facility]  ROUNDING NOTE   Subjective:  Creatinine currently down to 1.7. Patient much more awake alert and interactive today. She will be discharged to a rehabilitation facility.  Objective:  Vital signs in last 24 hours:  Temp:  [97.5 F (36.4 C)-97.8 F (36.6 C)] 97.7 F (36.5 C) (05/24 1134) Pulse Rate:  [52-122] 52 (05/24 1134) Resp:  [19-22] 19 (05/24 1134) BP: (106-122)/(55-79) 106/79 mmHg (05/24 1134) SpO2:  [83 %-96 %] 88 % (05/24 1134)  Weight change:  Filed Weights   12/31/15 1013 12/31/15 1706  Weight: 108.863 kg (240 lb) 119.523 kg (263 lb 8 oz)    Intake/Output: I/O last 3 completed shifts: In: 480 [P.O.:480] Out: 301 [Urine:300; Stool:1]   Intake/Output this shift:  Total I/O In: 360 [P.O.:360] Out: -   Physical Exam: General: NAD, laying in bed  Head: Normocephalic, atraumatic. Moist oral mucosal membranes  Eyes: Anicteric  Neck: Supple, trachea midline  Lungs:  Bilateral rhonchi, normal effort  Heart: Irregular no rubs  Abdomen:  Soft, nontender, obese  Extremities:  1+ peripheral and dependent edema.  Neurologic: Awake, alert, follows commands  Skin: No lesions       Basic Metabolic Panel:  Recent Labs Lab 01/02/16 0657 01/03/16 0603 01/04/16 0430 01/05/16 0508 01/06/16 0448  NA 138 139 141 142 144  K 4.6 4.6 4.5 4.8 4.9  CL 103 103 106 105 107  CO2 26 27 28  32 32  GLUCOSE 198* 241* 268* 285* 180*  BUN 75* 83* 79* 69* 63*  CREATININE 2.49* 2.54* 2.29* 2.08* 1.79*  CALCIUM 8.4* 8.3* 8.9 8.8* 9.0  MG  --   --  2.8*  --   --     Liver Function Tests:  Recent Labs Lab 12/31/15 1039 01/06/16 0448  AST 32 21  ALT 37 35  ALKPHOS 150* 122  BILITOT 1.4* 1.2  PROT 7.0 6.4*  ALBUMIN 3.5 3.7   No results for input(s): LIPASE, AMYLASE in the last 168 hours.  Recent Labs Lab 01/06/16 0448  AMMONIA 26    CBC:  Recent Labs Lab 12/31/15 1039 01/02/16 0657  WBC 4.6 6.3  NEUTROABS 3.6 5.7  HGB 9.3* 8.7*   HCT 30.6* 28.9*  MCV 82.7 83.0  PLT 246 253    Cardiac Enzymes:  Recent Labs Lab 12/31/15 1039  TROPONINI <0.03    BNP: Invalid input(s): POCBNP  CBG:  Recent Labs Lab 01/05/16 1131 01/05/16 1610 01/05/16 2058 01/06/16 0739 01/06/16 1133  GLUCAP 196* 291* 333* 127* 227*    Microbiology: Results for orders placed or performed during the hospital encounter of 12/31/15  Blood Culture (routine x 2)     Status: None   Collection Time: 12/31/15 10:39 AM  Result Value Ref Range Status   Specimen Description BLOOD LEFT AC  Final   Special Requests   Final    BOTTLES DRAWN AEROBIC AND ANAEROBIC AER 4ML ANA 3ML   Culture NO GROWTH 5 DAYS  Final   Report Status 01/05/2016 FINAL  Final  Blood Culture (routine x 2)     Status: None   Collection Time: 12/31/15 10:39 AM  Result Value Ref Range Status   Specimen Description BLOOD LEFT HAND  Final   Special Requests BOTTLES DRAWN AEROBIC AND ANAEROBIC .5ML  Final   Culture NO GROWTH 5 DAYS  Final   Report Status 01/05/2016 FINAL  Final  Urine culture     Status: None   Collection Time: 12/31/15 10:39 AM  Result  Value Ref Range Status   Specimen Description URINE, RANDOM  Final   Special Requests NONE  Final   Culture NO GROWTH Performed at [facility]   Final   Report Status 01/01/2016 FINAL  Final    Coagulation Studies: No results for input(s): LABPROT, INR in the last 72 hours.  Urinalysis: No results for input(s): COLORURINE, LABSPEC, PHURINE, GLUCOSEU, HGBUR, BILIRUBINUR, KETONESUR, PROTEINUR, UROBILINOGEN, NITRITE, LEUKOCYTESUR in the last 72 hours.  Invalid input(s): APPERANCEUR    Imaging: No results found.   Medications:     . amoxicillin-clavulanate  1 tablet Oral BID  . aspirin EC  81 mg Oral Daily  . budesonide (PULMICORT) nebulizer solution  0.25 mg Nebulization Q6H  . calcium carbonate  1.5 tablet Oral Daily  . cholecalciferol  1,000 Units Oral Daily  . diltiazem  180 mg Oral Daily   . furosemide  20 mg Oral Daily  . insulin aspart  0-15 Units Subcutaneous TID WC  . insulin aspart  0-5 Units Subcutaneous QHS  . insulin aspart  4 Units Subcutaneous TID WC  . insulin glargine  30 Units Subcutaneous q morning - 10a  . ipratropium-albuterol  3 mL Nebulization Q6H  . levothyroxine  100 mcg Oral QAC breakfast  . metoprolol tartrate  12.5 mg Oral BID  . ondansetron  4 mg Oral BID  . pantoprazole  40 mg Oral Daily  . predniSONE  20 mg Oral Q breakfast  . simvastatin  10 mg Oral Daily   acetaminophen **OR** acetaminophen, albuterol, bisacodyl, guaiFENesin, senna-docusate, zolpidem  Assessment/ Plan:  Allison Shepard is a 82 year old white female with atrial fibrillation, hypertension, COPD, osteoarthritis, hyperlipidemia, osteopenia, allergic rhinitis, lumbar spinal stenosis presents as a follow up patient chronic kidney disease stage III with proteinuria.   1. Acute Renal Failure on Chronic Kidney Disease stage IV with proteinuria: Acute renal failure from hypoxia and acute cardio-renal syndrome.  Baseline creatinine of 1.8, eGFR of 27. 10/26/15. Chronic Kidney Disease secondary to diabetic nephropathy and hypertensive nephrosclerosis - creatinine now down to 1.79 which is close to her baseline.  BUN is still elevated but may be secondary to prednisone.  Patient will need continued followup as an outpatient which we will set up.  2. Hypertension and Edema:   -  Blood pressure at last check was 106/79.  Continue metoprolol and diltiazem.  3. Secondary Hyperparathyroidism: Continue vitamin D 1000 international units by mouth daily.  Continue to monitor bone mineral metabolism parameters as an outpatient.  4. Diabetes Mellitus type II with chronic kidney disease:  - Continue glucose control Per hospitalist.  5. COPD: now with aspiration.  - Continue prednisone, ipratropium/albuterol, and Pulmicort.    LOS: 6 Allison Shepard 5/24/20172:53 PM

## 2016-01-06 NOTE — Progress Notes (Signed)
Speech Language Pathology Treatment: Dysphagia (Education on diet; aspiration precautions w/ family/pt)  Patient Details Name: Allison Shepard MRN: [id number] DOB: 03-Jan-1934 Today's Date: 01/06/2016 Time: 1300-1330 SLP Time Calculation (min) (ACUTE ONLY): 30 min  Assessment / Plan / Recommendation Clinical Impression   Met w/ pt and pt's family members including Husband this PM. Pt is preparing to discharge to SNF this PM. Family was interested in learning more about the recommended diet consistency. This session was spent giving education on dysphagia and the need for her recommended, dysphagia diet, prep explanation, food options, potential foods for a diet upgrade w/ supervision of a Speech Therapist at [facility] when appropriate; and discussion of the importance of continuing to follow strict aspiration precautions as she has done in the hospital w/ po's d/t her increased risk for aspiration sec. to her declined Cognitive status. Handouts given on diet consistencies as discussed above; precautions. Husband will f/u w/ NSG and ST services for assessment of appropriateness for upgrade of diet consistency. Discussed risks for aspiration and feeding support/environment. Husband agreed          HPI HPI: Pt is awake today, sitting in a chair in room. Husband present. Pt is verbally conversive today (excessively) but w/ increased confusion and required max verbal/tactile cues to maintain attention to task. Noted pt continued to exhale forcefully w/ each breath but did not appear SOB or have WOB. Pt was able to assist in self-feeding w/ cues. When she dozed off x1 post session, pt awakened herself w/ coughing on her own saliva (it appeared). Husband stated she oftens does this at home. He also explained her coughing "spells" after meals in which she expectorates moderate phlegm. Pt stated she felt similar coughing after lunch today. However, overall it has improved per Husband in the past 2 days since  being on the Pureed diet - pt tends to put too much in her mouth at times he reported. MD is aware of pt's GI dysmotility issues and the increased risk for aspiration of Reflux material.      SLP Plan  Continue with current plan of care     Recommendations  Diet recommendations: Dysphagia 1 (puree);Thin liquid Liquids provided via: Cup;Straw Medication Administration: Crushed with puree Supervision: Staff to assist with self feeding;Full supervision/cueing for compensatory strategies Compensations: Minimize environmental distractions;Slow rate;Small sips/bites;Multiple dry swallows after each bite/sip;Follow solids with liquid Postural Changes and/or Swallow Maneuvers: Seated upright 90 degrees;Upright 30-60 min after meal (rest breaks as needed). MUST reduce talking during oral intake; distractions at meals.              General recommendations:  (Dietician consult) Oral Care Recommendations: Oral care BID;Staff/trained caregiver to provide oral care Follow up Recommendations: Skilled Nursing facility Plan: Continue with current plan of care     [place], [place], CCC-SLP  Watson,Katherine 01/06/2016, 4:01 PM

## 2016-01-06 NOTE — Progress Notes (Signed)
Brief nutrition note:   Dietitian Consult received for assessment. Pt with orders to discharge today. Chart reviewed; appetite improved, pt eating mostly 100% of meals at present. Also receiving Magic Cup TID on meal trays. If pt not discharge today, will complete full nutrition assessment on follow-up tomorrow  Romelle Starcherate Dorissa Stinnette MS, RD, LDN [phone] Pager  [phone] Weekend/On-Call Pager

## 2016-01-06 NOTE — Progress Notes (Signed)
Palliative Medicine Inpatient Consult Follow Up Note   Name: Allison Shepard Date: 01/06/2016 MRN: [id number]  DOB: 1933-12-18  Referring Physician: Shaune Pollack, MD  Palliative Care consult requested for this 82 y.o. female for goals of medical therapy in patient with multi-organ system failures --now somewhat improved. She has issues with heart, lungs, kidneys, and her mind.   DISCUSSIONS AND PLAN: I spoke at some length with pt's very pleasant husband.  I mentioned that pt is currently appropriate for hospice care, but that it is a good idea for now for her to have a rehab stay.  He says their money is limited so he isn't sure what he will do when her rehab days come to an end. He agrees that it would be a GOOD IDEA FOR HER TO HAVE HOSPICE IN THE HOME ONCE SHE IS DISCHARGED FROM [facility].    In the meantime, husband would very much like a Palliative Care consult in [facility].    She will be full code for now, but he may change that.    CLINICAL NARRATIVE: Pt is 82 yo woman who came to the ED c/o worsening shortness of breath and difficulty getting around the home. She was hypoxic on her usual 2 liters of O2. Sats were in the 80's. She had been seen in the ED the day prior about 10 hrs following a fall which resulted in pain in the left knee. She was diagnosed with a UTI, given an RX for Keflex, and sent home. During that ED visit, the husband stated that the pt is having more DELUSIONS and HALLUCINATIONS for months. She was found to have en elevated BNP and s/s of CHF and was admitted with diagnosis of CHF. Dr Lady Gary is her cardiologist.   She has continued to have some confusion and hallucinations here. She had a transient choking episode while eating breakfast on 5/20. She has continued to have wheezing and has had occasional productive coughing. Aspiration was diagnosed. Now she cannot swallow pills. SLP evaluated pt and ordered Dysphagia 1 with honey thik Liquids and  strict aspiration precautions as well as feeding assistance and monitoring. All meds to be crushed with puree.   Dr Sung Amabile saw pt on 5/21 and he discussed having scheduled nebs and steroids ordered and also a palliative care consultation.  -------------------------------------------------------------------------------     REVIEW OF SYSTEMS:  Patient is not able to provide ROS due to confusion.  CODE STATUS: Full code --but husband is encouraged to consider DNR. He says he may change her to DNR if he finds out she is doing very badly.  Pt had wanted full code herself, but she is often quite confused and today does not have the capacity to discuss this rationally.    PAST MEDICAL HISTORY: Past Medical History  Diagnosis Date  . COPD (chronic obstructive pulmonary disease) (HCC)   . Atrial fibrillation (HCC)   . Hypertension   . Hypercholesteremia   . Pneumonia   . Diabetes mellitus without complication (HCC)     type 2  . Headache     migraines - years ago  . Shortness of breath dyspnea   . Asthma   . Dysrhythmia     afib  . Chronic kidney disease     history of insufficiency  . Arthritis   . Spinal disorder     stenosis  . Complication of anesthesia     difficulty to wake up after an 8 hour back surgery  .  Edema     legs/feet  . Wheezing     PAST SURGICAL HISTORY:  Past Surgical History  Procedure Laterality Date  . Abdominal hysterectomy    . Shoulder fusion surgery Right   . Total knee arthroplasty Right     knee replacement  . Total hip arthroplasty Right     hip replacement  . Back surgery    . Cataract extraction      cataract surgery ? eye  . Fracture surgery Right     right arm 20 years ago  . Colonoscopy      x 3  . Orif distal radius fracture Left 06/17/2015  . Open reduction internal fixation (orif) distal radial fracture Left 06/17/2015    Procedure: OPEN REDUCTION INTERNAL FIXATION LEFT  DISTAL RADIUS AND ULNA FRACTURES;  Surgeon: Tarry KosNaiping M Xu,  MD;  Location: [facility] OR;  Service: Orthopedics;  Laterality: Left;  . Joint replacement    . Hernia repair    . Cataract extraction w/phaco Left 07/28/2015    Procedure: CATARACT EXTRACTION PHACO AND INTRAOCULAR LENS PLACEMENT (IOC);  Surgeon: Galen ManilaWilliam Porfilio, MD;  Location: [facility] ORS;  Service: Ophthalmology;  Laterality: Left;  US 2:16   . Esophagogastroduodenoscopy (egd) with propofol N/A 12/24/2015    Procedure: ESOPHAGOGASTRODUODENOSCOPY (EGD) WITH PROPOFOL;  Surgeon: Rachael Feeaniel P Jacobs, MD;  Location: [facility];  Service: Endoscopy;  Laterality: N/A;    Vital Signs: BP 106/55 mmHg  Pulse 112  Temp(Src) 97.5 F (36.4 C) (Oral)  Resp 22  Ht 5\' 2"  (1.575 m)  Wt 119.523 kg (263 lb 8 oz)  BMI 48.18 kg/m2  SpO2 95% Filed Weights   12/31/15 1013 12/31/15 1706  Weight: 108.863 kg (240 lb) 119.523 kg (263 lb 8 oz)    Estimated body mass index is 48.18 kg/(m^2) as calculated from the following:   Height as of this encounter: 5\' 2"  (1.575 m).   Weight as of this encounter: 119.523 kg (263 lb 8 oz).  PHYSICAL EXAM: Sleeping till wakened.  She takes her glasses off and just put them down to roll off the bed --without being aware She knows she is leaving the hospital but thinks she is going home --she is going to [facility] later today (husband says he cannot physically manage her currently) She is quite able to talk coherently about [place] (her mother and my ancestors are from [place] and she and her husband have travelled there often). But, she cannot converse rationally about code status --seems to blow it off and does not seem to have any idea what I am talking about. EOMI OP clear --no thrush seen Neck full Hrt rrr rate about 100 Lungs with decreased BS in bases Abd soft and NT Ext no cyanosis or mottling   LABS: CBC:    Component Value Date/Time   WBC 6.3 01/02/2016 0657   WBC 6.3 02/11/2014 0501   HGB 8.7* 01/02/2016 0657   HGB 14.6 02/11/2014 0501   HCT 28.9*  01/02/2016 0657   HCT 44.5 02/11/2014 0501   PLT 253 01/02/2016 0657   PLT 188 02/11/2014 0501   MCV 83.0 01/02/2016 0657   MCV 100 02/11/2014 0501   NEUTROABS 5.7 01/02/2016 0657   NEUTROABS 4.5 02/11/2014 0501   LYMPHSABS 0.3* 01/02/2016 0657   LYMPHSABS 1.2 02/11/2014 0501   MONOABS 0.4 01/02/2016 0657   MONOABS 0.4 02/11/2014 0501   EOSABS 0.0 01/02/2016 0657   EOSABS 0.2 02/11/2014 0501   BASOSABS 0.0 01/02/2016 16100657  BASOSABS 0.1 02/11/2014 0501   Comprehensive Metabolic Panel:    Component Value Date/Time   NA 144 01/06/2016 0448   NA 140 02/11/2014 0501   K 4.9 01/06/2016 0448   K 3.5 02/11/2014 0501   CL 107 01/06/2016 0448   CL 107 02/11/2014 0501   CO2 32 01/06/2016 0448   CO2 26 02/11/2014 0501   BUN 63* 01/06/2016 0448   BUN 35* 02/11/2014 0501   CREATININE 1.79* 01/06/2016 0448   CREATININE 1.23 02/11/2014 0501   GLUCOSE 180* 01/06/2016 0448   GLUCOSE 193* 02/11/2014 0501   CALCIUM 9.0 01/06/2016 0448   CALCIUM 8.8 02/11/2014 0501   AST 21 01/06/2016 0448   AST 55* 02/11/2014 0501   ALT 35 01/06/2016 0448   ALT 94* 02/11/2014 0501   ALKPHOS 122 01/06/2016 0448   ALKPHOS 74 02/11/2014 0501   BILITOT 1.2 01/06/2016 0448   BILITOT 0.9 02/11/2014 0501   PROT 6.4* 01/06/2016 0448   PROT 6.4 02/11/2014 0501   ALBUMIN 3.7 01/06/2016 0448   ALBUMIN 2.9* 02/11/2014 0501    More than 50% of the visit was spent in counseling/coordination of care: YES  Time Spent: 35 min

## 2016-01-06 NOTE — Clinical Social Work Placement (Signed)
   CLINICAL SOCIAL WORK PLACEMENT  NOTE  Date:  01/06/2016  Patient Details  Name: Allison Shepard MRN: [id number] Date of Birth: 12/23/1933  Clinical Social Work is seeking post-discharge placement for this patient at the Skilled  Nursing Facility level of care (*CSW will initial, date and re-position this form in  chart as items are completed):  Yes   Patient/family provided with [facility] Clinical Social Work Department's list of facilities offering this level of care within the geographic area requested by the patient (or if unable, by the patient's family).  Yes   Patient/family informed of their freedom to choose among providers that offer the needed level of care, that participate in Medicare, Medicaid or managed care program needed by the patient, have an available bed and are willing to accept the patient.  Yes   Patient/family informed of [facility]'s ownership interest in [facility] and [facility], as well as of the fact that they are under no obligation to receive care at these facilities.  PASRR submitted to EDS on       PASRR number received on       Existing PASRR number confirmed on 01/06/16     FL2 transmitted to all facilities in geographic area requested by pt/family on 01/06/16     FL2 transmitted to all facilities within larger geographic area on       Patient informed that his/her managed care company has contracts with or will negotiate with certain facilities, including the following:        Yes   Patient/family informed of bed offers received.  Patient chooses bed at  [facility])     Physician recommends and patient chooses bed at      Patient to be transferred to  [facility]) on 01/06/16.  Patient to be transferred to facility by  [facility])     Patient family notified on 01/06/16 of transfer.  Name of family member notified:   (Husband)     PHYSICIAN       Additional Comment:     _______________________________________________ Idamae Lusherhristina E Jesi Jurgens, LCSW 01/06/2016, 2:53 PM

## 2016-01-06 NOTE — Progress Notes (Addendum)
Patient discharged to [facility], report called to receiving RN, pt had brief choking episode this afternoon. This episode was much briefer than the episode this RN observed on Saturday, no emesis/sputum output. Monitor applied and patient was in AFib, rate controlled, 95% with neb going.  Pt recovered quickly and stated she had been "scared" when she drank water too quickly, Per MD Imogene Burnhen and patient's husband they still wanted the patient to go [facility].  Husband stated this is "normal" for her and he did not want her held in hospital.  IV site DCd, no assigned tele box.  EMS will transport to [facility]

## 2016-01-06 NOTE — Progress Notes (Addendum)
CSW contacted NorwayDoug- Scientist, research (physical sciences)Admissions Coordinator at [facility]. Per Gala Romneyoug he has a bed reserved for patient that is available today 01-06-16 but not until after 2PM. CSW will continue to follow and assist.   Woodroe Modehristina Lukis Bunt, MSW, LCSW-A Clinical Social Work Department [phone]

## 2016-01-06 NOTE — Progress Notes (Signed)
Patient will be discharged to [facility] via EMS.  Patient verbalized understanding of where she will be going, and acceptance.  IV DCd, bleeding controlled, no tele on.  Report called to [facility]

## 2016-01-06 NOTE — Care Management Important Message (Signed)
Important Message  Patient Details  Name: Allison Shepard MRN: [id number] Date of Birth: 09/15/1933   Medicare Important Message Given:  Yes    Olegario MessierKathy A Liddie Chichester 01/06/2016, 10:34 AM

## 2016-01-06 NOTE — Progress Notes (Signed)
Clinical Social Worker was informed that patient will be medically ready to discharge to [facility]. Patient and her husband are in a agreement with plan. CSW called Doug- Admissions Coordinator at [facility] to confirm that patient's bed is ready. Provided patient's room number 402 and number to call for report [phone]. All discharge information faxed to [facility] via [facility]. Rx's added to discharge packet.RN will call report and patient will discharge to [facility] via [facility].   Woodroe Modehristina Stevi Hollinshead, MSW, LCSW-A Clinical Social Work Department [phone]

## 2016-01-06 NOTE — Discharge Instructions (Signed)
Heart Failure Clinic appointment on January 14, 2016 at 11:00am with Clarisa Kindredina Hackney, FNP. Please call [phone] to reschedule.   Heart healthy and ADA diet. Activity as tolerated. Aspiration and fall precaution. Palliative consult in SNF. O2 [place] 3L.

## 2016-01-14 ENCOUNTER — Telehealth: Payer: Self-pay | Admitting: Family

## 2016-01-14 ENCOUNTER — Ambulatory Visit: Payer: Medicare Other | Admitting: Family

## 2016-01-14 NOTE — Telephone Encounter (Signed)
Patient did not show for her initial appointment at the Heart Failure Clinic today (01/14/16). Will attempt to reschedule.

## 2016-01-22 ENCOUNTER — Encounter: Payer: Self-pay | Admitting: Pulmonary Disease

## 2016-01-22 ENCOUNTER — Ambulatory Visit (INDEPENDENT_AMBULATORY_CARE_PROVIDER_SITE_OTHER): Payer: Medicare Other | Admitting: Pulmonary Disease

## 2016-01-22 VITALS — BP 138/76 | HR 82 | Ht 65.0 in | Wt 240.0 lb

## 2016-01-22 DIAGNOSIS — J9621 Acute and chronic respiratory failure with hypoxia: Secondary | ICD-10-CM

## 2016-01-22 DIAGNOSIS — J9622 Acute and chronic respiratory failure with hypercapnia: Secondary | ICD-10-CM

## 2016-01-27 NOTE — Progress Notes (Signed)
PROBLEMS: COPD Recent pneumococcal PNA Pulmonary hypertension Chronic hypoxemia GERD Chronic AF Severe deconditioning  INTERVAL HISTORY: Hospitalized 05/18-05/24/17 for acute on chronic hypercarbic and hypoxic respiratory failure due to AECOPD and CHF. She was seen by Palliative Care Medicine while in the hospital. Discharged [facility].   SUBJ: She is in a wheelchair. Continues to have progressive debilitation despite being in a Rehab facility. She has suffered a recent fall and reports hip pain and back pain. These are getting better. She is intermittently confused and is lethargic during this encounter. Denies CP, fever, purulent sputum, hemoptysis, LE edema and calf tenderness. Candee Furbish.   OBJCeasar Mons: Filed Vitals:   01/22/16 1020  BP: 138/76  Pulse: 82  Height: 5\' 5"  (1.651 m)  Weight: 240 lb (108.863 kg)  SpO2: 93%  2 lpm [place]  Lethargic, no respiratory distress HEENT WNL JVP cannot be assessed No wheezes IRIR, no M noted Obese, soft, +BS BLE in Ace wraps No focal neurological deficits  DATA: No new data  IMPRESSION: Acute on chronic respiratory failure with hypoxia and hypercapnia COPD CHF Chronic severe and progressive debilitation   PLAN: No changes made to her COPD regimen Referral to Hospice - Discussed goals of care with pt and her husband in some detail ROV in 6-8 wks   Billy Fischeravid Luqman Perrelli, MD PCCM service Mobile [phone] Pager [phone] 01/27/2016

## 2016-01-30 ENCOUNTER — Emergency Department: Payer: Medicare Other

## 2016-01-30 ENCOUNTER — Emergency Department
Admission: EM | Admit: 2016-01-30 | Discharge: 2016-01-31 | Disposition: A | Discharge status: Home / Self Care | Payer: Medicare Other | Attending: Emergency Medicine | Admitting: Emergency Medicine

## 2016-01-30 DIAGNOSIS — S62635B Displaced fracture of distal phalanx of left ring finger, initial encounter for open fracture: Secondary | ICD-10-CM | POA: Diagnosis not present

## 2016-01-30 DIAGNOSIS — Z5189 Encounter for other specified aftercare: Secondary | ICD-10-CM

## 2016-01-30 DIAGNOSIS — S62609B Fracture of unspecified phalanx of unspecified finger, initial encounter for open fracture: Secondary | ICD-10-CM

## 2016-01-30 DIAGNOSIS — J441 Chronic obstructive pulmonary disease with (acute) exacerbation: Secondary | ICD-10-CM | POA: Insufficient documentation

## 2016-01-30 DIAGNOSIS — E1122 Type 2 diabetes mellitus with diabetic chronic kidney disease: Secondary | ICD-10-CM | POA: Diagnosis not present

## 2016-01-30 DIAGNOSIS — I130 Hypertensive heart and chronic kidney disease with heart failure and stage 1 through stage 4 chronic kidney disease, or unspecified chronic kidney disease: Secondary | ICD-10-CM | POA: Insufficient documentation

## 2016-01-30 DIAGNOSIS — S61219A Laceration without foreign body of unspecified finger without damage to nail, initial encounter: Secondary | ICD-10-CM

## 2016-01-30 DIAGNOSIS — I509 Heart failure, unspecified: Secondary | ICD-10-CM | POA: Insufficient documentation

## 2016-01-30 DIAGNOSIS — Z96651 Presence of right artificial knee joint: Secondary | ICD-10-CM | POA: Insufficient documentation

## 2016-01-30 DIAGNOSIS — Y999 Unspecified external cause status: Secondary | ICD-10-CM | POA: Diagnosis not present

## 2016-01-30 DIAGNOSIS — Z79899 Other long term (current) drug therapy: Secondary | ICD-10-CM | POA: Diagnosis not present

## 2016-01-30 DIAGNOSIS — Z87891 Personal history of nicotine dependence: Secondary | ICD-10-CM | POA: Insufficient documentation

## 2016-01-30 DIAGNOSIS — Z794 Long term (current) use of insulin: Secondary | ICD-10-CM | POA: Insufficient documentation

## 2016-01-30 DIAGNOSIS — W230XXA Caught, crushed, jammed, or pinched between moving objects, initial encounter: Secondary | ICD-10-CM | POA: Insufficient documentation

## 2016-01-30 DIAGNOSIS — Y939 Activity, unspecified: Secondary | ICD-10-CM | POA: Insufficient documentation

## 2016-01-30 DIAGNOSIS — Z96641 Presence of right artificial hip joint: Secondary | ICD-10-CM | POA: Insufficient documentation

## 2016-01-30 DIAGNOSIS — J45909 Unspecified asthma, uncomplicated: Secondary | ICD-10-CM | POA: Insufficient documentation

## 2016-01-30 DIAGNOSIS — Y929 Unspecified place or not applicable: Secondary | ICD-10-CM | POA: Insufficient documentation

## 2016-01-30 DIAGNOSIS — N189 Chronic kidney disease, unspecified: Secondary | ICD-10-CM | POA: Insufficient documentation

## 2016-01-30 DIAGNOSIS — M1990 Unspecified osteoarthritis, unspecified site: Secondary | ICD-10-CM | POA: Diagnosis not present

## 2016-01-30 DIAGNOSIS — Z7982 Long term (current) use of aspirin: Secondary | ICD-10-CM | POA: Diagnosis not present

## 2016-01-30 DIAGNOSIS — S6992XA Unspecified injury of left wrist, hand and finger(s), initial encounter: Secondary | ICD-10-CM | POA: Diagnosis present

## 2016-01-30 DIAGNOSIS — I4891 Unspecified atrial fibrillation: Secondary | ICD-10-CM | POA: Insufficient documentation

## 2016-01-30 MED ORDER — TRAMADOL HCL 50 MG PO TABS: 50.0000 mg | ORAL_TABLET | Freq: Four times a day (QID) | ORAL | Status: DC | PRN

## 2016-01-30 MED ORDER — ONDANSETRON 4 MG PO TBDP
4.0000 mg | ORAL_TABLET | Freq: Once | ORAL | Status: AC
  Administered 2016-01-30: 4 mg via ORAL

## 2016-01-30 MED ORDER — ONDANSETRON 4 MG PO TBDP
ORAL_TABLET | ORAL | Status: AC
  Administered 2016-01-30: 4 mg via ORAL
  Filled 2016-01-30: qty 1

## 2016-01-30 MED ORDER — CEPHALEXIN 500 MG PO CAPS
500.0000 mg | ORAL_CAPSULE | Freq: Once | ORAL | Status: AC
  Administered 2016-01-30: 500 mg via ORAL

## 2016-01-30 MED ORDER — TRAMADOL HCL 50 MG PO TABS
ORAL_TABLET | ORAL | Status: AC
  Administered 2016-01-30: 50 mg via ORAL
  Filled 2016-01-30: qty 1

## 2016-01-30 MED ORDER — TRAMADOL HCL 50 MG PO TABS
50.0000 mg | ORAL_TABLET | Freq: Once | ORAL | Status: AC
  Administered 2016-01-30: 50 mg via ORAL

## 2016-01-30 MED ORDER — CEPHALEXIN 500 MG PO CAPS: 500.0000 mg | ORAL_CAPSULE | Freq: Four times a day (QID) | ORAL | Status: DC

## 2016-01-30 NOTE — ED Notes (Addendum)
Pt bib EMS w/ c/o finger injury.  Sts that she got L ring finger in wheelchair while rolling.  Pt denies SOB, CP, fall, LOC or dizziness at this time.  Denies any other medical complaints at this time.  Band Aid that pt put on has gotten under nail, bleeding controled.

## 2016-01-30 NOTE — Discharge Instructions (Signed)
1. Take antibiotic as prescribed (Keflex 500 mg 3 times daily 7 days). 2. You may take pain medicine as needed (Tramadol #20). 3. Keep wound clean and dry. 4. Return to the ER for worsening symptoms, increased redness/swelling, purulent discharge or other concerns.  Finger Fracture Fractures of fingers are breaks in the bones of the fingers. There are many types of fractures. There are different ways of treating these fractures. Your health care provider will discuss the best way to treat your fracture. CAUSES Traumatic injury is the main cause of broken fingers. These include:  Injuries while playing sports.  Workplace injuries.  Falls. RISK FACTORS Activities that can increase your risk of finger fractures include:  Sports.  Workplace activities that involve machinery.  A condition called osteoporosis, which can make your bones less dense and cause them to fracture more easily. SIGNS AND SYMPTOMS The main symptoms of a broken finger are pain and swelling within 15 minutes after the injury. Other symptoms include:  Bruising of your finger.  Stiffness of your finger.  Numbness of your finger.  Exposed bones (compound fracture) if the fracture is severe. DIAGNOSIS  The best way to diagnose a broken bone is with X-ray imaging. Additionally, your health care provider will use this X-ray image to evaluate the position of the broken finger bones.  TREATMENT  Finger fractures can be treated with:   Nonreduction--This means the bones are in place. The finger is splinted without changing the positions of the bone pieces. The splint is usually left on for about a week to 10 days. This will depend on your fracture and what your health care provider thinks.  Closed reduction--The bones are put back into position without using surgery. The finger is then splinted.  Open reduction and internal fixation--The fracture site is opened. Then the bone pieces are fixed into place with pins or  some type of hardware. This is seldom required. It depends on the severity of the fracture. HOME CARE INSTRUCTIONS   Follow your health care provider's instructions regarding activities, exercises, and physical therapy.  Only take over-the-counter or prescription medicines for pain, discomfort, or fever as directed by your health care provider. SEEK MEDICAL CARE IF: You have pain or swelling that limits the motion or use of your fingers. SEEK IMMEDIATE MEDICAL CARE IF:  Your finger becomes numb. MAKE SURE YOU:   Understand these instructions.  Will watch your condition.  Will get help right away if you are not doing well or get worse.   This information is not intended to replace advice given to you by your health care provider. Make sure you discuss any questions you have with your health care provider.   Document Released: 11/13/2000 Document Revised: 05/22/2013 Document Reviewed: 03/13/2013 Elsevier Interactive Patient Education 2016 Elsevier Inc.  Nonsutured Laceration Care A laceration is a cut that goes through all layers of the skin and extends into the tissue that is right under the skin. This type of cut is usually stitched up (sutured) or closed with tape (adhesive strips) or skin glue shortly after the injury happens. However, if the wound is dirty or if several hours pass before medical treatment is provided, it is likely that germs (bacteria) will enter the wound. Closing a laceration after bacteria have entered it increases the risk of infection. In these cases, your health care provider may leave the laceration open (nonsutured) and cover it with a bandage. This type of treatment helps prevent infection and allows the wound to  heal from the deepest layer of tissue damage up to the surface. An open fracture is a type of injury that may involve nonsutured lacerations. An open fracture is a break in a bone that happens along with one or more lacerations through the skin that is  near the fracture site. HOW TO CARE FOR YOUR NONSUTURED LACERATION  Take or apply over-the-counter and prescription medicines only as told by your health care provider.  If you were prescribed an antibiotic medicine, take or apply it as told by your health care provider. Do not stop using the antibiotic even if your condition improves.  Clean the wound one time each day or as told by your health care provider.  Wash the wound with mild soap and water.  Rinse the wound with water to remove all soap.  Pat your wound dry with a clean towel. Do not rub the wound.  Do not inject anything into the wound unless your health care provider told you to.  Change any bandages (dressings) as told by your health care provider. This includes changing the dressing if it gets wet, dirty, or starts to smell bad.  Keep the dressing dry until your health care provider says it can be removed. Do not take baths, swim, or do anything that puts your wound underwater until your health care provider approves.  Raise (elevate) the injured area above the level of your heart while you are sitting or lying down, if possible.  Do not scratch or pick at the wound.  Check your wound every day for signs of infection. Watch for:  Redness, swelling, or pain.  Fluid, blood, or pus.  Keep all follow-up visits as told by your health care provider. This is important. SEEK MEDICAL CARE IF:  You received a tetanus and shot and you have swelling, severe pain, redness, or bleeding at the injection site.   You have a fever.  Your pain is not controlled with medicine.  You have increased redness, swelling, or pain at the site of your wound.  You have fluid, blood, or pus coming from your wound.  You notice a bad smell coming from your wound or your dressing.  You notice something coming out of the wound, such as wood or glass.  You notice a change in the color of your skin near your wound.  You develop a new  rash.  You need to change the dressing frequently due to fluid, blood, or pus draining from the wound.  You develop numbness around your wound. SEEK IMMEDIATE MEDICAL CARE IF:  Your pain suddenly increases and is severe.  You develop severe swelling around the wound.  The wound is on your hand or foot and you cannot properly move a finger or toe.  The wound is on your hand or foot and you notice that your fingers or toes look pale or bluish.  You have a red streak going away from your wound.   This information is not intended to replace advice given to you by your health care provider. Make sure you discuss any questions you have with your health care provider.   Document Released: 06/29/2006 Document Revised: 12/16/2014 Document Reviewed: 07/28/2014 Elsevier Interactive Patient Education 2016 Elsevier Inc.  Wound Care Taking care of your wound properly can help to prevent pain and infection. It can also help your wound to heal more quickly.  HOW TO CARE FOR YOUR WOUND  Take or apply over-the-counter and prescription medicines only as told by your  health care provider.  If you were prescribed antibiotic medicine, take or apply it as told by your health care provider. Do not stop using the antibiotic even if your condition improves.  Clean the wound each day or as told by your health care provider.  Wash the wound with mild soap and water.  Rinse the wound with water to remove all soap.  Pat the wound dry with a clean towel. Do not rub it.  There are many different ways to close and cover a wound. For example, a wound can be covered with stitches (sutures), skin glue, or adhesive strips. Follow instructions from your health care provider about:  How to take care of your wound.  When and how you should change your bandage (dressing).  When you should remove your dressing.  Removing whatever was used to close your wound.  Check your wound every day for signs of  infection. Watch for:  Redness, swelling, or pain.  Fluid, blood, or pus.  Keep the dressing dry until your health care provider says it can be removed. Do not take baths, swim, use a hot tub, or do anything that would put your wound underwater until your health care provider approves.  Raise (elevate) the injured area above the level of your heart while you are sitting or lying down.  Do not scratch or pick at the wound.  Keep all follow-up visits as told by your health care provider. This is important. SEEK MEDICAL CARE IF:  You received a tetanus shot and you have swelling, severe pain, redness, or bleeding at the injection site.  You have a fever.  Your pain is not controlled with medicine.  You have increased redness, swelling, or pain at the site of your wound.  You have fluid, blood, or pus coming from your wound.  You notice a bad smell coming from your wound or your dressing. SEEK IMMEDIATE MEDICAL CARE IF:  You have a red streak going away from your wound.   This information is not intended to replace advice given to you by your health care provider. Make sure you discuss any questions you have with your health care provider.   Document Released: 05/10/2008 Document Revised: 12/16/2014 Document Reviewed: 07/28/2014 Elsevier Interactive Patient Education [employer]

## 2016-01-30 NOTE — ED Provider Notes (Signed)
[facility] Emergency Department Provider Note   ____________________________________________  Time seen: Approximately 11:21 PM  I have reviewed the triage vital signs and the nursing notes.   HISTORY  Chief Complaint Finger Injury    HPI Allison Shepard is a 82 y.o. female who presents to the ED from home via EMS with a chief complaint finger injury. States she got her left ring finger crushed in her wheelchair while rolling.She is right-hand dominant. Tetanus is up-to-date, less than 10 years. Denies any other medical complaints. Complains of pain to distal left fourth digit. Denies numbness/tingling. Nothing makes her pain better. Movement makes her pain worse.   Past Medical History  Diagnosis Date  . COPD (chronic obstructive pulmonary disease) (HCC)   . Atrial fibrillation (HCC)   . Hypertension   . Hypercholesteremia   . Pneumonia   . Diabetes mellitus without complication (HCC)     type 2  . Headache     migraines - years ago  . Shortness of breath dyspnea   . Asthma   . Dysrhythmia     afib  . Chronic kidney disease     history of insufficiency  . Arthritis   . Spinal disorder     stenosis  . Complication of anesthesia     difficulty to wake up after an 8 hour back surgery  . Edema     legs/feet  . Wheezing     Patient Active Problem List   Diagnosis Date Noted  . COPD with exacerbation (HCC)   . CHF (congestive heart failure) (HCC) 12/31/2015  . Acute respiratory failure with hypoxia (HCC) 07/17/2015  . Left upper lobe pneumonia 07/17/2015  . Hemoptysis 07/17/2015  . Atrial fibrillation with RVR (HCC) 07/17/2015  . Hypotension 07/17/2015  . Renal insufficiency 07/17/2015  . Obesity 07/17/2015  . Metabolic encephalopathy 07/17/2015  . Fracture of radius, distal, with ulna, left, closed 06/17/2015  . S/P ORIF (open reduction internal fixation) fracture 06/17/2015  . Diaphragmatic hernia 04/07/2015  . Restrictive  lung disease secondary to obesity 04/07/2015  . Pneumonia 04/07/2015  . COPD exacerbation (HCC) 04/07/2015  . Other emphysema (HCC) 04/07/2015  . Morbid obesity (HCC) 04/07/2015  . Debility 04/07/2015  . Sepsis (HCC) 04/06/2015  . Chronic a-fib (HCC) 03/20/2015  . Acute respiratory distress (HCC) 03/16/2015    Past Surgical History  Procedure Laterality Date  . Abdominal hysterectomy    . Shoulder fusion surgery Right   . Total knee arthroplasty Right     knee replacement  . Total hip arthroplasty Right     hip replacement  . Back surgery    . Cataract extraction      cataract surgery ? eye  . Fracture surgery Right     right arm 20 years ago  . Colonoscopy      x 3  . Orif distal radius fracture Left 06/17/2015  . Open reduction internal fixation (orif) distal radial fracture Left 06/17/2015    Procedure: OPEN REDUCTION INTERNAL FIXATION LEFT  DISTAL RADIUS AND ULNA FRACTURES;  Surgeon: Tarry KosNaiping M Xu, MD;  Location: [facility] OR;  Service: Orthopedics;  Laterality: Left;  . Joint replacement    . Hernia repair    . Cataract extraction w/phaco Left 07/28/2015    Procedure: CATARACT EXTRACTION PHACO AND INTRAOCULAR LENS PLACEMENT (IOC);  Surgeon: Galen ManilaWilliam Porfilio, MD;  Location: [facility] ORS;  Service: Ophthalmology;  Laterality: Left;  US 2:16   . Esophagogastroduodenoscopy (egd) with propofol N/A 12/24/2015  Procedure: ESOPHAGOGASTRODUODENOSCOPY (EGD) WITH PROPOFOL;  Surgeon: [name], MD;  Location: [facility] ENDOSCOPY;  Service: Endoscopy;  Laterality: N/A;    Current Outpatient Rx  Name  Route  Sig  Dispense  Refill  . albuterol (PROVENTIL HFA;VENTOLIN HFA) 108 (90 BASE) MCG/ACT inhaler   Inhalation   Inhale 2 puffs into the lungs every 6 (six) hours as needed for wheezing or shortness of breath.         Marland Kitchen albuterol (PROVENTIL) (2.5 MG/3ML) 0.083% nebulizer solution   Nebulization   Take 2.5 mg by nebulization every 6 (six) hours as needed for wheezing or shortness of  breath.         Marland Kitchen amoxicillin-clavulanate (AUGMENTIN) 500-125 MG tablet   Oral   Take 1 tablet (500 mg total) by mouth 2 (two) times daily.   6 tablet   0   . aspirin EC 81 MG tablet   Oral   Take 81 mg by mouth daily.         . calcium carbonate (TUMS EX) 750 MG chewable tablet   Oral   Chew 1 tablet by mouth daily.         . Cholecalciferol (VITAMIN D-3) 1000 UNITS CAPS   Oral   Take 1,000 Units by mouth daily.         Marland Kitchen diltiazem (CARDIZEM CD) 180 MG 24 hr capsule   Oral   Take 180 mg by mouth daily.          . Fluticasone-Salmeterol (ADVAIR DISKUS) 250-50 MCG/DOSE AEPB   Inhalation   Inhale 1 puff into the lungs 2 (two) times daily.   60 each   0   . furosemide (LASIX) 20 MG tablet   Oral   Take 1 tablet (20 mg total) by mouth daily.   30 tablet      . guaiFENesin (ROBITUSSIN) 100 MG/5ML SOLN   Oral   Take 5 mLs (100 mg total) by mouth every 4 (four) hours as needed for cough or to loosen phlegm.   118 mL   0   . hydrocortisone valerate cream (WESTCORT) 0.2 %   Topical   Apply 1 application topically daily.         . insulin aspart (NOVOLOG) 100 UNIT/ML injection   Subcutaneous   Inject 8-12 Units into the skin 3 (three) times daily with meals. Below 200=8 units and every 50 units above she gets two extra units up to 12 units.         . insulin glargine (LANTUS) 100 UNIT/ML injection   Subcutaneous   Inject 0.3 mLs (30 Units total) into the skin every morning.   10 mL   3   . levothyroxine (SYNTHROID, LEVOTHROID) 100 MCG tablet   Oral   Take 100 mcg by mouth daily before breakfast.      11   . metoprolol tartrate (LOPRESSOR) 25 MG tablet   Oral   Take 0.5 tablets (12.5 mg total) by mouth 2 (two) times daily.         Marland Kitchen nystatin (MYCOSTATIN) powder   Topical   Apply 1 g topically 2 (two) times daily as needed.         Marland Kitchen omeprazole (PRILOSEC) 20 MG capsule   Oral   Take 20 mg by mouth daily.         . ondansetron (ZOFRAN)  4 MG tablet   Oral   Take 1 tablet by mouth 2 (two) times daily.  6   . senna-docusate (SENOKOT-S) 8.6-50 MG tablet   Oral   Take 1 tablet by mouth at bedtime as needed for mild constipation.         . simvastatin (ZOCOR) 10 MG tablet   Oral   Take 10 mg by mouth daily.         Marland Kitchen zolpidem (AMBIEN) 5 MG tablet   Oral   Take 5 mg by mouth at bedtime as needed for sleep. Reported on 09/08/2015           Allergies Hydrocodone-chlorpheniramine; Hydromorphone hcl; Ibuprofen; Mucinex; and Percocet  Family History  Problem Relation Age of Onset  . Heart disease Father     Social History Social History  Substance Use Topics  . Smoking status: Former Smoker    Quit date: 06/15/1992  . Smokeless tobacco: Never Used  . Alcohol Use: 8.4 oz/week    7 Glasses of wine, 7 Shots of liquor, 0 Standard drinks or equivalent per week     Comment: daily    Review of Systems  Constitutional: No fever/chills. Eyes: No visual changes. ENT: No sore throat. Cardiovascular: Denies chest pain. Respiratory: Denies shortness of breath. Gastrointestinal: No abdominal pain.  No nausea, no vomiting.  No diarrhea.  No constipation. Genitourinary: Negative for dysuria. Musculoskeletal: Positive for left finger pain. Negative for back pain. Skin: Negative for rash. Neurological: Negative for headaches, focal weakness or numbness.  10-point ROS otherwise negative.  ____________________________________________   PHYSICAL EXAM:  VITAL SIGNS: ED Triage Vitals  Enc Vitals Group     BP 01/30/16 2232 105/79 mmHg     Pulse Rate 01/30/16 2232 120     Resp 01/30/16 2232 20     Temp 01/30/16 2232 97.9 F (36.6 C)     Temp Source 01/30/16 2232 Oral     SpO2 01/30/16 2232 93 %     Weight --      Height --      Head Cir --      Peak Flow --      Pain Score 01/30/16 2235 10     Pain Loc --      Pain Edu? --      Excl. in GC? --     Constitutional: Alert and oriented. Well appearing  and in no acute distress. Eyes: Conjunctivae are normal. PERRL. EOMI. Head: Atraumatic. Nose: No congestion/rhinnorhea. Mouth/Throat: Mucous membranes are moist.  Oropharynx non-erythematous. Neck: No stridor.   Cardiovascular: Normal rate, regular rhythm. Grossly normal heart sounds.  Good peripheral circulation. Respiratory: Normal respiratory effort.  No retractions. Lungs CTAB. Gastrointestinal: Soft and nontender. No distention. No abdominal bruits. No CVA tenderness. Musculoskeletal:  Left fourth digit: Avulsion type crush injury to distal digit. Proximal end of nail avulsed; fingernail still adhered. Small abrasion to dorsal mid finger. Slight venous oozing. Diminished fingerpad sensation. Brisk, less than 5 second capillary refill. 2+ radial pulses. Full range of motion. Neurologic:  Normal speech and language. No gross focal neurologic deficits are appreciated. No gait instability. Skin:  Skin is warm, dry and intact. No rash noted. Psychiatric: Mood and affect are normal. Speech and behavior are normal.  ____________________________________________   LABS (all labs ordered are listed, but only abnormal results are displayed)  Labs Reviewed - No data to display ____________________________________________  EKG  None ____________________________________________  RADIOLOGY  Left ring finger x-rays (viewed by me, interpreted per Dr. Andria Meuse): Comminuted crush fractures of the distal phalangeal tuft of the left fourth finger with soft  tissue gas suggesting an open fracture. ____________________________________________   PROCEDURES  Procedure(s) performed: None  Critical Care performed: No  ____________________________________________   INITIAL IMPRESSION / ASSESSMENT AND PLAN / ED COURSE  Pertinent labs & imaging results that were available during my care of the patient were reviewed by me and considered in my medical decision making (see chart for  details).  82 year old female who presents with crush injury to fourth fingertip of nondominant hand with tufts fracture. Discussed with patient and spouse she will lose that fingernail, but want to keep fingernail in place to diminish risk of infection. Will soak finger, and cleanse and apply wound care with Xeroform and dressing. Will place patient on Keflex and she will follow up closely with orthopedics next week. Strict return precautions given. Both verbalize understanding and agree with plan of care.  ----------------------------------------- 12:19 AM on 01/31/2016 -----------------------------------------  Examined dressing which remains clean and dry without bleeding through. Patient is overall feeling better. Patient has a history of atrial fibrillation; heart rate bouncing between 98 and 106. Patient is eager to go home. We will provide sling so she can elevate finger and she will follow-up with orthopedics in 2-3 days. ____________________________________________   FINAL CLINICAL IMPRESSION(S) / ED DIAGNOSES  Final diagnoses:  Finger fracture, left, open, initial encounter  Finger laceration, initial encounter  Encounter for wound care      NEW MEDICATIONS STARTED DURING THIS VISIT:  New Prescriptions   No medications on file     Note:  This document was prepared using Dragon voice recognition software and may include unintentional dictation errors.    Irean Hong, MD 01/31/16 [phone]

## 2016-01-31 NOTE — ED Notes (Signed)
Discharge instructions reviewed with patient. Patient verbalized understanding. Patient taken to lobby via wheelchair and helped into vehicle by this RN.  

## 2016-02-02 ENCOUNTER — Telehealth: Payer: Self-pay | Admitting: Pulmonary Disease

## 2016-02-02 NOTE — Telephone Encounter (Signed)
Allison CastillaAndrew with [facility] called and wants to know if Dr. Sung AmabileSimonds will sign an order for certification of terminal illness. Please call Allison Castillandrew back # [phone].

## 2016-02-21 ENCOUNTER — Inpatient Hospital Stay
Admission: EM | Admit: 2016-02-21 | Discharge: 2016-02-23 | DRG: 291 | Disposition: A | Discharge status: Home / Self Care | Payer: Medicare Other | Attending: Internal Medicine | Admitting: Internal Medicine

## 2016-02-21 ENCOUNTER — Encounter: Payer: Self-pay | Admitting: Emergency Medicine

## 2016-02-21 ENCOUNTER — Emergency Department: Payer: Medicare Other

## 2016-02-21 DIAGNOSIS — R0600 Dyspnea, unspecified: Secondary | ICD-10-CM | POA: Diagnosis present

## 2016-02-21 DIAGNOSIS — Z885 Allergy status to narcotic agent status: Secondary | ICD-10-CM

## 2016-02-21 DIAGNOSIS — R601 Generalized edema: Secondary | ICD-10-CM | POA: Diagnosis not present

## 2016-02-21 DIAGNOSIS — N184 Chronic kidney disease, stage 4 (severe): Secondary | ICD-10-CM | POA: Diagnosis present

## 2016-02-21 DIAGNOSIS — M4806 Spinal stenosis, lumbar region: Secondary | ICD-10-CM | POA: Diagnosis present

## 2016-02-21 DIAGNOSIS — Z79899 Other long term (current) drug therapy: Secondary | ICD-10-CM

## 2016-02-21 DIAGNOSIS — Z7951 Long term (current) use of inhaled steroids: Secondary | ICD-10-CM

## 2016-02-21 DIAGNOSIS — I5033 Acute on chronic diastolic (congestive) heart failure: Secondary | ICD-10-CM | POA: Diagnosis present

## 2016-02-21 DIAGNOSIS — Z96641 Presence of right artificial hip joint: Secondary | ICD-10-CM | POA: Diagnosis present

## 2016-02-21 DIAGNOSIS — Z888 Allergy status to other drugs, medicaments and biological substances status: Secondary | ICD-10-CM

## 2016-02-21 DIAGNOSIS — M1990 Unspecified osteoarthritis, unspecified site: Secondary | ICD-10-CM | POA: Diagnosis present

## 2016-02-21 DIAGNOSIS — E1121 Type 2 diabetes mellitus with diabetic nephropathy: Secondary | ICD-10-CM | POA: Diagnosis present

## 2016-02-21 DIAGNOSIS — E1122 Type 2 diabetes mellitus with diabetic chronic kidney disease: Secondary | ICD-10-CM | POA: Diagnosis present

## 2016-02-21 DIAGNOSIS — J449 Chronic obstructive pulmonary disease, unspecified: Secondary | ICD-10-CM

## 2016-02-21 DIAGNOSIS — I130 Hypertensive heart and chronic kidney disease with heart failure and stage 1 through stage 4 chronic kidney disease, or unspecified chronic kidney disease: Secondary | ICD-10-CM | POA: Diagnosis not present

## 2016-02-21 DIAGNOSIS — J9621 Acute and chronic respiratory failure with hypoxia: Secondary | ICD-10-CM | POA: Diagnosis present

## 2016-02-21 DIAGNOSIS — Z961 Presence of intraocular lens: Secondary | ICD-10-CM | POA: Diagnosis present

## 2016-02-21 DIAGNOSIS — J441 Chronic obstructive pulmonary disease with (acute) exacerbation: Secondary | ICD-10-CM | POA: Diagnosis present

## 2016-02-21 DIAGNOSIS — I4891 Unspecified atrial fibrillation: Secondary | ICD-10-CM | POA: Diagnosis present

## 2016-02-21 DIAGNOSIS — E039 Hypothyroidism, unspecified: Secondary | ICD-10-CM | POA: Diagnosis present

## 2016-02-21 DIAGNOSIS — Z96651 Presence of right artificial knee joint: Secondary | ICD-10-CM | POA: Diagnosis present

## 2016-02-21 DIAGNOSIS — Z90710 Acquired absence of both cervix and uterus: Secondary | ICD-10-CM

## 2016-02-21 DIAGNOSIS — E785 Hyperlipidemia, unspecified: Secondary | ICD-10-CM | POA: Diagnosis present

## 2016-02-21 DIAGNOSIS — Z87891 Personal history of nicotine dependence: Secondary | ICD-10-CM

## 2016-02-21 DIAGNOSIS — D631 Anemia in chronic kidney disease: Secondary | ICD-10-CM | POA: Diagnosis present

## 2016-02-21 DIAGNOSIS — Z794 Long term (current) use of insulin: Secondary | ICD-10-CM

## 2016-02-21 DIAGNOSIS — Z8249 Family history of ischemic heart disease and other diseases of the circulatory system: Secondary | ICD-10-CM

## 2016-02-21 DIAGNOSIS — I509 Heart failure, unspecified: Secondary | ICD-10-CM

## 2016-02-21 DIAGNOSIS — Z7982 Long term (current) use of aspirin: Secondary | ICD-10-CM

## 2016-02-21 DIAGNOSIS — Z9842 Cataract extraction status, left eye: Secondary | ICD-10-CM

## 2016-02-21 DIAGNOSIS — Z9981 Dependence on supplemental oxygen: Secondary | ICD-10-CM

## 2016-02-21 HISTORY — DX: Heart failure, unspecified: I50.9

## 2016-02-21 LAB — TROPONIN I

## 2016-02-21 LAB — CBC WITH DIFFERENTIAL/PLATELET
BASOS ABS: 0.1 10*3/uL (ref 0–0.1)
Basophils Relative: 1 %
EOS PCT: 2 %
Eosinophils Absolute: 0.1 10*3/uL (ref 0–0.7)
HEMATOCRIT: 29.4 % — AB (ref 35.0–47.0)
Hemoglobin: 9 g/dL — ABNORMAL LOW (ref 12.0–16.0)
LYMPHS PCT: 9 %
Lymphs Abs: 0.7 10*3/uL — ABNORMAL LOW (ref 1.0–3.6)
MCH: 24.6 pg — ABNORMAL LOW (ref 26.0–34.0)
MCHC: 30.5 g/dL — ABNORMAL LOW (ref 32.0–36.0)
MCV: 80.7 fL (ref 80.0–100.0)
Monocytes Absolute: 0.7 10*3/uL (ref 0.2–0.9)
Monocytes Relative: 9 %
NEUTROS PCT: 79 %
Neutro Abs: 6.3 10*3/uL (ref 1.4–6.5)
PLATELETS: 256 10*3/uL (ref 150–440)
RBC: 3.65 MIL/uL — AB (ref 3.80–5.20)
RDW: 20.8 % — ABNORMAL HIGH (ref 11.5–14.5)
WBC: 7.9 10*3/uL (ref 3.6–11.0)

## 2016-02-21 LAB — BASIC METABOLIC PANEL
ANION GAP: 8 (ref 5–15)
BUN: 24 mg/dL — ABNORMAL HIGH (ref 6–20)
CO2: 25 mmol/L (ref 22–32)
Calcium: 8.7 mg/dL — ABNORMAL LOW (ref 8.9–10.3)
Chloride: 108 mmol/L (ref 101–111)
Creatinine, Ser: 1.68 mg/dL — ABNORMAL HIGH (ref 0.44–1.00)
GFR, EST AFRICAN AMERICAN: 32 mL/min — AB (ref >60)
GFR, EST NON AFRICAN AMERICAN: 28 mL/min — AB (ref >60)
GLUCOSE: 101 mg/dL — AB (ref 65–99)
POTASSIUM: 4.6 mmol/L (ref 3.5–5.1)
Sodium: 141 mmol/L (ref 135–145)

## 2016-02-21 LAB — BRAIN NATRIURETIC PEPTIDE: B Natriuretic Peptide: 692 pg/mL — ABNORMAL HIGH (ref 0.0–100.0)

## 2016-02-21 LAB — LACTIC ACID, PLASMA: LACTIC ACID, VENOUS: 1.4 mmol/L (ref 0.5–1.9)

## 2016-02-21 MED ORDER — METHYLPREDNISOLONE SODIUM SUCC 125 MG IJ SOLR
125.0000 mg | Freq: Once | INTRAMUSCULAR | Status: AC
  Administered 2016-02-21: 125 mg via INTRAVENOUS
  Filled 2016-02-21: qty 2

## 2016-02-21 MED ORDER — FUROSEMIDE 10 MG/ML IJ SOLN
40.0000 mg | Freq: Once | INTRAMUSCULAR | Status: AC
  Administered 2016-02-21: 40 mg via INTRAVENOUS
  Filled 2016-02-21: qty 4

## 2016-02-21 NOTE — ED Notes (Signed)
Pt ambulatory to toilet with 1 assist.  

## 2016-02-21 NOTE — ED Notes (Signed)
Pt placed on bi-pap at this time, RT at bedside

## 2016-02-21 NOTE — ED Notes (Signed)
Pt taken off bi-pap per MD Mayford KnifeWilliams and pt request. Pt placed on 4L [place] at 98%

## 2016-02-21 NOTE — ED Provider Notes (Signed)
[facility] Emergency Department Provider Note   L5 caveat: Review of systems and history is limited by altered mental status and dyspnea     Time seen: ----------------------------------------- 9:13 PM on 02/21/2016 -----------------------------------------    I have reviewed the triage vital signs and the nursing notes.   HISTORY  Chief Complaint Shortness of Breath    HPI Allison Shepard is a 82 y.o. female who presents to ER with cough and shortness of breath. According to EMS patient was alert but somewhat drowsy. On their arrival oxygen saturation saturations are around 70-80% with 15 L and she was given 5 mg of albuterol. She arouses 4 L nasal cannula oxygen with a sat of 91%. She does have a history of COPD. Recently she had laryngoscopy as well. Edema was noted on the patient peripherally.   Past Medical History  Diagnosis Date  . COPD (chronic obstructive pulmonary disease) (HCC)   . Atrial fibrillation (HCC)   . Hypertension   . Hypercholesteremia   . Pneumonia   . Diabetes mellitus without complication (HCC)     type 2  . Headache     migraines - years ago  . Shortness of breath dyspnea   . Asthma   . Dysrhythmia     afib  . Chronic kidney disease     history of insufficiency  . Arthritis   . Spinal disorder     stenosis  . Complication of anesthesia     difficulty to wake up after an 8 hour back surgery  . Edema     legs/feet  . Wheezing     Patient Active Problem List   Diagnosis Date Noted  . COPD with exacerbation (HCC)   . CHF (congestive heart failure) (HCC) 12/31/2015  . Acute respiratory failure with hypoxia (HCC) 07/17/2015  . Left upper lobe pneumonia 07/17/2015  . Hemoptysis 07/17/2015  . Atrial fibrillation with RVR (HCC) 07/17/2015  . Hypotension 07/17/2015  . Renal insufficiency 07/17/2015  . Obesity 07/17/2015  . Metabolic encephalopathy 07/17/2015  . Fracture of radius, distal, with ulna, left,  closed 06/17/2015  . S/P ORIF (open reduction internal fixation) fracture 06/17/2015  . Diaphragmatic hernia 04/07/2015  . Restrictive lung disease secondary to obesity 04/07/2015  . Pneumonia 04/07/2015  . COPD exacerbation (HCC) 04/07/2015  . Other emphysema (HCC) 04/07/2015  . Morbid obesity (HCC) 04/07/2015  . Debility 04/07/2015  . Sepsis (HCC) 04/06/2015  . Chronic a-fib (HCC) 03/20/2015  . Acute respiratory distress (HCC) 03/16/2015    Past Surgical History  Procedure Laterality Date  . Abdominal hysterectomy    . Shoulder fusion surgery Right   . Total knee arthroplasty Right     knee replacement  . Total hip arthroplasty Right     hip replacement  . Back surgery    . Cataract extraction      cataract surgery ? eye  . Fracture surgery Right     right arm 20 years ago  . Colonoscopy      x 3  . Orif distal radius fracture Left 06/17/2015  . Open reduction internal fixation (orif) distal radial fracture Left 06/17/2015    Procedure: OPEN REDUCTION INTERNAL FIXATION LEFT  DISTAL RADIUS AND ULNA FRACTURES;  Surgeon: Tarry Kos, MD;  Location: [facility] OR;  Service: Orthopedics;  Laterality: Left;  . Joint replacement    . Hernia repair    . Cataract extraction w/phaco Left 07/28/2015    Procedure: CATARACT EXTRACTION PHACO AND INTRAOCULAR LENS  PLACEMENT (IOC);  Surgeon: Galen Manila, MD;  Location: [facility] ORS;  Service: Ophthalmology;  Laterality: Left;  [place] 2:16   . Esophagogastroduodenoscopy (egd) with propofol N/A 12/24/2015    Procedure: ESOPHAGOGASTRODUODENOSCOPY (EGD) WITH PROPOFOL;  Surgeon: [name], MD;  Location: [facility] END[facility];  Service: Endoscopy;  Laterality: N/A;    Allergies Hydrocodone-chlorpheniramine; Hydromorphone hcl; Ibuprofen; Mucinex; and Percocet  Social History Social History  Substance Use Topics  . Smoking status: Former Smoker    Quit date: 06/15/1992  . Smokeless tobacco: Never Used  . Alcohol Use: 8.4 oz/week    7 Glasses of wine, 7  Shots of liquor, 0 Standard drinks or equivalent per week     Comment: daily    Review of Systems Constitutional: Negative for fever. Cardiovascular: Negative for chest pain. Respiratory: Positive shortness of breath Gastrointestinal: Negative for abdominal pain, vomiting and diarrhea. Genitourinary: Negative for dysuria. Musculoskeletal: Negative for back pain. Positive for edema Skin: Negative for rash. Neurological: Negative for headaches, positive for weakness  10-point ROS otherwise negative.  ____________________________________________   PHYSICAL EXAM:  VITAL SIGNS: ED Triage Vitals  Enc Vitals Group     BP --      Pulse Rate 02/21/16 2108 94     Resp --      Temp --      Temp src --      SpO2 02/21/16 2108 91 %     Weight --      Height --      Head Cir --      Peak Flow --      Pain Score --      Pain Loc --      Pain Edu? --      Excl. in GC? --    Constitutional: Drowsy but oriented. Mild to moderate distress Eyes: Conjunctivae are normal. PERRL. Normal extraocular movements. ENT   Head: Normocephalic and atraumatic.   Nose: No congestion/rhinnorhea.   Mouth/Throat: Mucous membranes are moist.   Neck: No stridor. Cardiovascular: Normal rate, regular rhythm. No murmurs, rubs, or gallops. Respiratory: Prolonged expirations, wheezing bilaterally. Gastrointestinal: Soft and nontender. Normal bowel sounds Musculoskeletal: Nontender with normal range of motion in all extremities. Pitting edema to her lower chest Neurologic: Lethargic, No gross focal neurologic deficits are appreciated.  Skin:  Skin is warm, dry and intact. Mild erythema is appreciated on the left leg  ____________________________________________  EKG: Interpreted by me.Sinus tachycardia with rate 112 bpm, normal PR interval, normal QRS, long QT interval. Right axis deviation, low voltage.  ____________________________________________  ED COURSE:  Pertinent labs & imaging  results that were available during my care of the patient were reviewed by me and considered in my medical decision making (see chart for details). Patient presents to ER with significant shortness of breath, likely multifactorial. We will check basic labs, she will need to be started on BiPAP. She will also receive IV Solu-Medrol. ____________________________________________    LABS (pertinent positives/negatives)  Labs Reviewed  CBC WITH DIFFERENTIAL/PLATELET - Abnormal; Notable for the following:    RBC 3.65 (*)    Hemoglobin 9.0 (*)    HCT 29.4 (*)    MCH 24.6 (*)    MCHC 30.5 (*)    RDW 20.8 (*)    Lymphs Abs 0.7 (*)    All other components within normal limits  BASIC METABOLIC PANEL - Abnormal; Notable for the following:    Glucose, Bld 101 (*)    BUN 24 (*)    Creatinine,  Ser 1.68 (*)    Calcium 8.7 (*)    GFR calc non Af Amer 28 (*)    GFR calc Af Amer 32 (*)    All other components within normal limits  BRAIN NATRIURETIC PEPTIDE - Abnormal; Notable for the following:    B Natriuretic Peptide 692.0 (*)    All other components within normal limits  BLOOD GAS, VENOUS - Abnormal; Notable for the following:    Bicarbonate 28.9 (*)    All other components within normal limits  TROPONIN I  LACTIC ACID, PLASMA    RADIOLOGY Images were viewed by me  Chest x-ray  IMPRESSION: Cardiomegaly with suspected mild interstitial edema. Left upper lobe pneumonia is considered less likely. ____________________________________________  FINAL ASSESSMENT AND PLAN  Dyspnea, COPD, Edema, CHF  Plan: Patient with labs and imaging as dictated above. Patient presents to the ER and is well known to me. She did receive [place] dilators as well as IV Solu-Medrol. She appears to have a component of CHF and has significant peripheral edema. She was given IV Lasix. I'll recommend observation, continue diuresis and breathing treatment as needed.   Emily FilbertWilliams, Jonathan E, MD   Note: This  dictation was prepared with Dragon dictation. Any transcriptional errors that result from this process are unintentional   Emily FilbertJonathan E Williams, MD 02/21/16 2248

## 2016-02-21 NOTE — ED Notes (Signed)
Pt to ED via EMS from home with family, pt c/o cough and SOB. Per EMS pt is alert but drowsy. Per EMS sats 70-80% with 15L and given 5mg  albuterol. Pt is on 4L [place] continously.  %

## 2016-02-22 ENCOUNTER — Inpatient Hospital Stay
Admit: 2016-02-22 | Discharge: 2016-02-22 | Disposition: A | Discharge status: Home / Self Care | Payer: Medicare Other | Attending: Internal Medicine | Admitting: Internal Medicine

## 2016-02-22 DIAGNOSIS — Z87891 Personal history of nicotine dependence: Secondary | ICD-10-CM | POA: Diagnosis not present

## 2016-02-22 DIAGNOSIS — E1122 Type 2 diabetes mellitus with diabetic chronic kidney disease: Secondary | ICD-10-CM | POA: Diagnosis present

## 2016-02-22 DIAGNOSIS — M1990 Unspecified osteoarthritis, unspecified site: Secondary | ICD-10-CM | POA: Diagnosis present

## 2016-02-22 DIAGNOSIS — I4891 Unspecified atrial fibrillation: Secondary | ICD-10-CM | POA: Diagnosis present

## 2016-02-22 DIAGNOSIS — Z96641 Presence of right artificial hip joint: Secondary | ICD-10-CM | POA: Diagnosis present

## 2016-02-22 DIAGNOSIS — Z9842 Cataract extraction status, left eye: Secondary | ICD-10-CM | POA: Diagnosis not present

## 2016-02-22 DIAGNOSIS — Z7951 Long term (current) use of inhaled steroids: Secondary | ICD-10-CM | POA: Diagnosis not present

## 2016-02-22 DIAGNOSIS — J9621 Acute and chronic respiratory failure with hypoxia: Secondary | ICD-10-CM | POA: Diagnosis present

## 2016-02-22 DIAGNOSIS — M4806 Spinal stenosis, lumbar region: Secondary | ICD-10-CM | POA: Diagnosis present

## 2016-02-22 DIAGNOSIS — Z90710 Acquired absence of both cervix and uterus: Secondary | ICD-10-CM | POA: Diagnosis not present

## 2016-02-22 DIAGNOSIS — Z885 Allergy status to narcotic agent status: Secondary | ICD-10-CM | POA: Diagnosis not present

## 2016-02-22 DIAGNOSIS — D631 Anemia in chronic kidney disease: Secondary | ICD-10-CM | POA: Diagnosis present

## 2016-02-22 DIAGNOSIS — I130 Hypertensive heart and chronic kidney disease with heart failure and stage 1 through stage 4 chronic kidney disease, or unspecified chronic kidney disease: Secondary | ICD-10-CM | POA: Diagnosis present

## 2016-02-22 DIAGNOSIS — R601 Generalized edema: Secondary | ICD-10-CM | POA: Diagnosis present

## 2016-02-22 DIAGNOSIS — Z8249 Family history of ischemic heart disease and other diseases of the circulatory system: Secondary | ICD-10-CM | POA: Diagnosis not present

## 2016-02-22 DIAGNOSIS — J441 Chronic obstructive pulmonary disease with (acute) exacerbation: Secondary | ICD-10-CM | POA: Diagnosis present

## 2016-02-22 DIAGNOSIS — Z96651 Presence of right artificial knee joint: Secondary | ICD-10-CM | POA: Diagnosis present

## 2016-02-22 DIAGNOSIS — E785 Hyperlipidemia, unspecified: Secondary | ICD-10-CM | POA: Diagnosis present

## 2016-02-22 DIAGNOSIS — R0600 Dyspnea, unspecified: Secondary | ICD-10-CM | POA: Diagnosis present

## 2016-02-22 DIAGNOSIS — N184 Chronic kidney disease, stage 4 (severe): Secondary | ICD-10-CM | POA: Diagnosis present

## 2016-02-22 DIAGNOSIS — E1121 Type 2 diabetes mellitus with diabetic nephropathy: Secondary | ICD-10-CM | POA: Diagnosis present

## 2016-02-22 DIAGNOSIS — Z794 Long term (current) use of insulin: Secondary | ICD-10-CM | POA: Diagnosis not present

## 2016-02-22 DIAGNOSIS — Z7982 Long term (current) use of aspirin: Secondary | ICD-10-CM | POA: Diagnosis not present

## 2016-02-22 DIAGNOSIS — Z79899 Other long term (current) drug therapy: Secondary | ICD-10-CM | POA: Diagnosis not present

## 2016-02-22 DIAGNOSIS — E039 Hypothyroidism, unspecified: Secondary | ICD-10-CM | POA: Diagnosis present

## 2016-02-22 DIAGNOSIS — Z9981 Dependence on supplemental oxygen: Secondary | ICD-10-CM | POA: Diagnosis not present

## 2016-02-22 DIAGNOSIS — Z888 Allergy status to other drugs, medicaments and biological substances status: Secondary | ICD-10-CM | POA: Diagnosis not present

## 2016-02-22 DIAGNOSIS — I5033 Acute on chronic diastolic (congestive) heart failure: Secondary | ICD-10-CM | POA: Diagnosis present

## 2016-02-22 DIAGNOSIS — Z961 Presence of intraocular lens: Secondary | ICD-10-CM | POA: Diagnosis present

## 2016-02-22 LAB — BLOOD GAS, ARTERIAL
ACID-BASE EXCESS: 1.8 mmol/L (ref 0.0–3.0)
ALLENS TEST (PASS/FAIL): POSITIVE — AB
Bicarbonate: 26.6 mEq/L (ref 21.0–28.0)
FIO2: 0.36
O2 SAT: 95.5 %
PATIENT TEMPERATURE: 37
pCO2 arterial: 42 mmHg (ref 32.0–48.0)
pH, Arterial: 7.41 (ref 7.350–7.450)
pO2, Arterial: 78 mmHg — ABNORMAL LOW (ref 83.0–108.0)

## 2016-02-22 LAB — CBC WITH DIFFERENTIAL/PLATELET
BASOS ABS: 0 10*3/uL (ref 0–0.1)
Basophils Relative: 0 %
EOS PCT: 0 %
Eosinophils Absolute: 0 10*3/uL (ref 0–0.7)
HEMATOCRIT: 28.7 % — AB (ref 35.0–47.0)
Hemoglobin: 8.9 g/dL — ABNORMAL LOW (ref 12.0–16.0)
LYMPHS ABS: 0.3 10*3/uL — AB (ref 1.0–3.6)
LYMPHS PCT: 4 %
MCH: 24.7 pg — AB (ref 26.0–34.0)
MCHC: 31.1 g/dL — ABNORMAL LOW (ref 32.0–36.0)
MCV: 79.6 fL — AB (ref 80.0–100.0)
MONO ABS: 0.1 10*3/uL — AB (ref 0.2–0.9)
Monocytes Relative: 1 %
NEUTROS ABS: 6.9 10*3/uL — AB (ref 1.4–6.5)
Neutrophils Relative %: 95 %
PLATELETS: 248 10*3/uL (ref 150–440)
RBC: 3.6 MIL/uL — ABNORMAL LOW (ref 3.80–5.20)
RDW: 21.3 % — AB (ref 11.5–14.5)
WBC: 7.3 10*3/uL (ref 3.6–11.0)

## 2016-02-22 LAB — ECHOCARDIOGRAM COMPLETE
HEIGHTINCHES: 65 in
Weight: 4197.56 oz

## 2016-02-22 LAB — TROPONIN I: Troponin I: <0.03 ng/mL (ref <0.03)

## 2016-02-22 LAB — MRSA PCR SCREENING: MRSA by PCR: NEGATIVE

## 2016-02-22 LAB — GLUCOSE, CAPILLARY
GLUCOSE-CAPILLARY: 174 mg/dL — AB (ref 65–99)
GLUCOSE-CAPILLARY: 188 mg/dL — AB (ref 65–99)
GLUCOSE-CAPILLARY: 254 mg/dL — AB (ref 65–99)
GLUCOSE-CAPILLARY: 268 mg/dL — AB (ref 65–99)

## 2016-02-22 MED ORDER — CALCIUM CARBONATE ANTACID 500 MG PO CHEW
1.5000 | CHEWABLE_TABLET | Freq: Every day | ORAL | Status: DC
  Administered 2016-02-22: 200 mg via ORAL
  Administered 2016-02-23: 300 mg via ORAL
  Filled 2016-02-22 (×2): qty 1

## 2016-02-22 MED ORDER — INSULIN ASPART 100 UNIT/ML ~~LOC~~ SOLN
0.0000 [IU] | Freq: Every day | SUBCUTANEOUS | Status: DC
  Administered 2016-02-22: 3 [IU] via SUBCUTANEOUS
  Filled 2016-02-22: qty 3

## 2016-02-22 MED ORDER — METOPROLOL TARTRATE 25 MG PO TABS
12.5000 mg | ORAL_TABLET | Freq: Two times a day (BID) | ORAL | Status: DC
  Administered 2016-02-22 – 2016-02-23 (×4): 12.5 mg via ORAL
  Filled 2016-02-22 (×4): qty 1

## 2016-02-22 MED ORDER — ONDANSETRON HCL 4 MG/2ML IJ SOLN: 4.0000 mg | Freq: Four times a day (QID) | INTRAMUSCULAR | Status: DC | PRN

## 2016-02-22 MED ORDER — LEVOFLOXACIN IN D5W 500 MG/100ML IV SOLN
500.0000 mg | Freq: Once | INTRAVENOUS | Status: AC
  Administered 2016-02-22: 500 mg via INTRAVENOUS
  Filled 2016-02-22: qty 100

## 2016-02-22 MED ORDER — FUROSEMIDE 10 MG/ML IJ SOLN
40.0000 mg | Freq: Two times a day (BID) | INTRAMUSCULAR | Status: DC
  Administered 2016-02-22 – 2016-02-23 (×2): 40 mg via INTRAVENOUS
  Filled 2016-02-22 (×3): qty 4

## 2016-02-22 MED ORDER — ALPRAZOLAM 0.25 MG PO TABS
0.2500 mg | ORAL_TABLET | Freq: Three times a day (TID) | ORAL | Status: DC | PRN
  Administered 2016-02-22 (×2): 0.25 mg via ORAL
  Filled 2016-02-22 (×2): qty 1

## 2016-02-22 MED ORDER — SIMVASTATIN 20 MG PO TABS
10.0000 mg | ORAL_TABLET | Freq: Every day | ORAL | Status: DC
  Administered 2016-02-22 – 2016-02-23 (×2): 10 mg via ORAL
  Filled 2016-02-22 (×2): qty 1

## 2016-02-22 MED ORDER — LEVOFLOXACIN IN D5W 250 MG/50ML IV SOLN
250.0000 mg | INTRAVENOUS | Status: DC
  Administered 2016-02-23: 250 mg via INTRAVENOUS
  Filled 2016-02-22: qty 50

## 2016-02-22 MED ORDER — SENNOSIDES-DOCUSATE SODIUM 8.6-50 MG PO TABS: 1.0000 | ORAL_TABLET | Freq: Every evening | ORAL | Status: DC | PRN

## 2016-02-22 MED ORDER — VITAMIN D 1000 UNITS PO TABS
1000.0000 [IU] | ORAL_TABLET | Freq: Every day | ORAL | Status: DC
  Administered 2016-02-22 – 2016-02-23 (×2): 1000 [IU] via ORAL
  Filled 2016-02-22 (×2): qty 1

## 2016-02-22 MED ORDER — INSULIN GLARGINE 100 UNIT/ML ~~LOC~~ SOLN
30.0000 [IU] | Freq: Every morning | SUBCUTANEOUS | Status: DC
  Administered 2016-02-22 – 2016-02-23 (×2): 30 [IU] via SUBCUTANEOUS
  Filled 2016-02-22 (×2): qty 0.3

## 2016-02-22 MED ORDER — CETYLPYRIDINIUM CHLORIDE 0.05 % MT LIQD
7.0000 mL | Freq: Two times a day (BID) | OROMUCOSAL | Status: DC
  Administered 2016-02-22 – 2016-02-23 (×4): 7 mL via OROMUCOSAL

## 2016-02-22 MED ORDER — SODIUM CHLORIDE 0.9% FLUSH
3.0000 mL | Freq: Two times a day (BID) | INTRAVENOUS | Status: DC
  Administered 2016-02-22 – 2016-02-23 (×3): 3 mL via INTRAVENOUS

## 2016-02-22 MED ORDER — LISINOPRIL 5 MG PO TABS
5.0000 mg | ORAL_TABLET | Freq: Every day | ORAL | Status: DC
  Administered 2016-02-22 – 2016-02-23 (×2): 5 mg via ORAL
  Filled 2016-02-22 (×2): qty 1

## 2016-02-22 MED ORDER — SODIUM CHLORIDE 0.9 % IV SOLN: 250.0000 mL | INTRAVENOUS | Status: DC | PRN

## 2016-02-22 MED ORDER — SODIUM CHLORIDE 0.9% FLUSH: 3.0000 mL | INTRAVENOUS | Status: DC | PRN

## 2016-02-22 MED ORDER — LEVOTHYROXINE SODIUM 50 MCG PO TABS
100.0000 ug | ORAL_TABLET | Freq: Every day | ORAL | Status: DC
  Administered 2016-02-22 – 2016-02-23 (×2): 100 ug via ORAL
  Filled 2016-02-22: qty 1
  Filled 2016-02-22: qty 2

## 2016-02-22 MED ORDER — DILTIAZEM HCL ER COATED BEADS 180 MG PO CP24
180.0000 mg | ORAL_CAPSULE | Freq: Every day | ORAL | Status: DC
  Administered 2016-02-22 – 2016-02-23 (×2): 180 mg via ORAL
  Filled 2016-02-22 (×2): qty 1

## 2016-02-22 MED ORDER — METHYLPREDNISOLONE SODIUM SUCC 125 MG IJ SOLR
60.0000 mg | Freq: Four times a day (QID) | INTRAMUSCULAR | Status: DC
  Administered 2016-02-22 – 2016-02-23 (×4): 60 mg via INTRAVENOUS
  Filled 2016-02-22 (×5): qty 2

## 2016-02-22 MED ORDER — ENOXAPARIN SODIUM 40 MG/0.4ML ~~LOC~~ SOLN
40.0000 mg | Freq: Two times a day (BID) | SUBCUTANEOUS | Status: DC
  Administered 2016-02-22 – 2016-02-23 (×3): 40 mg via SUBCUTANEOUS
  Filled 2016-02-22 (×3): qty 0.4

## 2016-02-22 MED ORDER — IPRATROPIUM-ALBUTEROL 0.5-2.5 (3) MG/3ML IN SOLN
3.0000 mL | Freq: Four times a day (QID) | RESPIRATORY_TRACT | Status: DC
  Administered 2016-02-22 (×4): 3 mL via RESPIRATORY_TRACT
  Filled 2016-02-22 (×5): qty 3

## 2016-02-22 MED ORDER — HYDROCORTISONE VALERATE 0.2 % EX CREA
1.0000 "application " | TOPICAL_CREAM | Freq: Every day | CUTANEOUS | Status: DC
  Administered 2016-02-22 – 2016-02-23 (×2): 1 via TOPICAL
  Filled 2016-02-22: qty 15

## 2016-02-22 MED ORDER — INSULIN ASPART 100 UNIT/ML ~~LOC~~ SOLN
0.0000 [IU] | Freq: Three times a day (TID) | SUBCUTANEOUS | Status: DC
  Administered 2016-02-22 (×2): 3 [IU] via SUBCUTANEOUS
  Administered 2016-02-23 (×2): 5 [IU] via SUBCUTANEOUS
  Filled 2016-02-22 (×2): qty 5
  Filled 2016-02-22: qty 3

## 2016-02-22 MED ORDER — ASPIRIN EC 81 MG PO TBEC
81.0000 mg | DELAYED_RELEASE_TABLET | Freq: Every day | ORAL | Status: DC
  Administered 2016-02-22 – 2016-02-23 (×2): 81 mg via ORAL
  Filled 2016-02-22 (×2): qty 1

## 2016-02-22 MED ORDER — PANTOPRAZOLE SODIUM 40 MG PO TBEC
40.0000 mg | DELAYED_RELEASE_TABLET | Freq: Every day | ORAL | Status: DC
  Administered 2016-02-22 – 2016-02-23 (×2): 40 mg via ORAL
  Filled 2016-02-22 (×2): qty 1

## 2016-02-22 MED ORDER — ZOLPIDEM TARTRATE 5 MG PO TABS
5.0000 mg | ORAL_TABLET | Freq: Every evening | ORAL | Status: DC | PRN
  Filled 2016-02-22: qty 1

## 2016-02-22 MED ORDER — GUAIFENESIN 100 MG/5ML PO SOLN
5.0000 mL | ORAL | Status: DC | PRN
  Filled 2016-02-22: qty 10

## 2016-02-22 NOTE — Progress Notes (Signed)
1315 Report called to Dutch JohnLeslie on 1A.

## 2016-02-22 NOTE — Progress Notes (Addendum)
[facility] [place] [facility]   PATIENT NAME: Allison Shepard    MR#:  [id number]  DATE OF BIRTH:  06/14/1934  SUBJECTIVE:   Patient Presented here with shortness of breath and lower extremity edema  REVIEW OF SYSTEMS:    Review of Systems  Constitutional: Negative for fever, chills and malaise/fatigue.  HENT: Negative for ear discharge, ear pain, hearing loss, nosebleeds and sore throat.   Eyes: Negative for blurred vision and pain.  Respiratory: Positive for shortness of breath. Negative for cough, hemoptysis and wheezing.   Cardiovascular: Positive for orthopnea, leg swelling and PND. Negative for chest pain and palpitations.  Gastrointestinal: Negative for nausea, vomiting, abdominal pain, diarrhea and blood in stool.  Genitourinary: Negative for dysuria.  Musculoskeletal: Negative for back pain.  Neurological: Negative for dizziness, tremors, speech change, focal weakness, seizures and headaches.  Endo/Heme/Allergies: Does not bruise/bleed easily.  Psychiatric/Behavioral: Negative for depression, suicidal ideas and hallucinations.    Tolerating Diet:yes      DRUG ALLERGIES:   Allergies  Allergen Reactions  . Hydrocodone-Chlorpheniramine Other (See Comments)    Confusion  . Hydromorphone Hcl Nausea And Vomiting  . Ibuprofen Other (See Comments)    Pt was told by her MD not to take this medication.    . Mucinex [Guaifenesin Er] Other (See Comments)    Red all over. Felt like i was on fire  . Percocet [Oxycodone-Acetaminophen] Hives    VITALS:  Blood pressure 139/99, pulse 117, temperature 97.8 F (36.6 C), temperature source Axillary, resp. rate 24, height  (1.651 m), weight 119 kg (262 lb 5.6 oz), SpO2 89 %.  PHYSICAL EXAMINATION:   Physical Exam  Constitutional: She is oriented to person, place, and time and well-developed, well-nourished, and in no distress. No distress.  HENT:  Head: Normocephalic.  Eyes: No scleral  icterus.  Neck: Normal range of motion. Neck supple. No JVD present. No tracheal deviation present.  Cardiovascular: Normal rate, regular rhythm and normal heart sounds.  Exam reveals no gallop and no friction rub.   No murmur heard. Pulmonary/Chest: Effort normal and breath sounds normal. No respiratory distress. She has no wheezes. She has no rales. She exhibits no tenderness.  Abdominal: Soft. Bowel sounds are normal. She exhibits distension. She exhibits no mass. There is no tenderness. There is no rebound and no guarding.  Musculoskeletal: Normal range of motion. She exhibits edema.  Neurological: She is alert and oriented to person, place, and time.  Skin: Skin is warm. No rash noted. No erythema.  Psychiatric: Affect and judgment normal.      LABORATORY PANEL:   CBC  Recent Labs Lab 02/22/16 0409  WBC 7.3  HGB 8.9*  HCT 28.7*  PLT 248   ------------------------------------------------------------------------------------------------------------------  Chemistries   Recent Labs Lab 02/21/16 2121  NA 141  K 4.6  CL 108  CO2 25  GLUCOSE 101*  BUN 24*  CREATININE 1.68*  CALCIUM 8.7*   ------------------------------------------------------------------------------------------------------------------  Cardiac Enzymes  Recent Labs Lab 02/21/16 2121 02/22/16 0403 02/22/16 1019  TROPONINI <0.03 <0.03 <0.03   ------------------------------------------------------------------------------------------------------------------  RADIOLOGY:  Dg Chest Port 1 View  02/21/2016  CLINICAL DATA:  Respiratory distress, shortness of breath, cough EXAM: PORTABLE CHEST 1 VIEW COMPARISON:  01/02/2016 FINDINGS: Increased interstitial markings, mildly asymmetric on the left, possibly reflecting mild interstitial edema. Left upper lobe pneumonia is considered less likely. No definite pleural effusions. No pneumothorax. Cardiomegaly. IMPRESSION: Cardiomegaly with suspected mild  interstitial edema. Left upper lobe pneumonia is considered  less likely. Electronically Signed   By: Charline BillsSriyesh  Krishnan M.D.   On: 02/21/2016 21:41     ASSESSMENT AND PLAN:   [age] year female with a history of COPD on home oxygen, diastolic heart failure and morbid obesity who presents with shortness of breath and found to have acute on chronic heart failure.  1. Acute on chronic hypoxic respiratory failure, due to decompensated diastolic heart failure. Continue to wean oxygen to baseline.  2. Acute on chronic diastolic heart failure with lower extremity edema and anasarca: Continue IV Lasix. Follow up on echo cardiac exam. Monitor daily weight and BMP. She does have severe peripheral edema and was wearing Unna boots.  3. Atrial fibrillation: Continue diltiazem and metoprolol for heart rate control. She is not on anticoagulation due to history of GI bleed.  4. Diabetes: Continue Lantus, ADA diet and siding scale insulin  5. CAPD: Continue Levaquin  6. Hypothyroid: Continue Synthroid  7. Essential hypertension: Continue metoprolol, lisinopril and diltiazem.  8. Chronic kidney disease stage IV with proteinuria: Continue ACE inhibitor and management as per nephrology.  9. Anemia of chronic kidney disease: Continue to monitor hemoglobin.   Management plans discussed with the patient and she is in agreement.  CODE STATUS: full  CRITICAL CARE TOTAL TIME TAKING CARE OF THIS PATIENT: 30 minutes.    Time 10:45-11:15 POSSIBLE D/C , DEPENDING ON CLINICAL CONDITION.   Chasey Dull M.D on 02/22/2016 at 11:31 AM  Between 7am to 6pm - Pager - [phone] After 6pm go to [email or web address] - password [email or web address]  [facility]  Office  [phone]  CC: Primary care physician; Mickey FarberHIES, DAVID, MD  Note: This dictation was prepared with Dragon dictation along with smaller phrase technology. Any transcriptional errors that result from this process are unintentional.

## 2016-02-22 NOTE — ED Notes (Signed)
Documentation at 0144 was completed by me not Juanetta. IT notified about [employer] discrepancy

## 2016-02-22 NOTE — Progress Notes (Signed)
1440 Transferred to 1A via bed.

## 2016-02-22 NOTE — Progress Notes (Signed)
Pharmacy Antibiotic Note  [name] is a 82 y.o. female admitted on 02/21/2016 with acute on chronic respiratory failure, r/o pneumonia.  Pharmacy has been consulted for Levaquin dosing.  Plan: Levaquin 500 mg iv once then 250 mg iv q 24 hours.   Height: 5\' 5"  (165.1 cm) Weight: 262 lb 5.6 oz (119 kg) IBW/kg (Calculated) : 57  Temp (24hrs), Avg:97.8 F (36.6 C), Min:97.8 F (36.6 C), Max:97.8 F (36.6 C)   Recent Labs Lab 02/21/16 2121 02/21/16 2230 02/22/16 0409  WBC 7.9  --  7.3  CREATININE 1.68*  --   --   LATICACIDVEN  --  1.4  --     Estimated Creatinine Clearance: 35.1 mL/min (by C-G formula based on Cr of 1.68).    Allergies  Allergen Reactions  . Hydrocodone-Chlorpheniramine Other (See Comments)    Confusion  . Hydromorphone Hcl Nausea And Vomiting  . Ibuprofen Other (See Comments)    Pt was told by her MD not to take this medication.    . Mucinex [Guaifenesin Er] Other (See Comments)    Red all over. Felt like i was on fire  . Percocet [Oxycodone-Acetaminophen] Hives    Antimicrobials this admission: Levaquin 7/10 >>   Dose adjustments this admission:   Microbiology results: 7/10 MRSA PCR: negative  Thank you for allowing pharmacy to be a part of this patient's care.  Luisa HartChristy, [name] 02/22/2016 8:13 AM

## 2016-02-22 NOTE — Progress Notes (Signed)
[facility]  ROUNDING NOTE   Subjective:   Admitted on 02/21/2016 for Anasarca [R60.1] Dyspnea [R06.00] Congestive heart failure, unspecified congestive heart failure chronicity, unspecified congestive heart failure type (HCC) [I50.9] Chronic obstructive pulmonary disease, unspecified COPD type (HCC) [J44.9]  Started on furosemide 40mg  IV q12. She is breathing well and in a good mood. Creatinine at baseline.   Objective:  Vital signs in last 24 hours:  Temp:  [97.8 F (36.6 C)] 97.8 F (36.6 C) (07/10 0231) Pulse Rate:  [39-117] 117 (07/10 1000) Resp:  [12-25] 24 (07/10 0700) BP: (111-162)/(67-121) 139/99 mmHg (07/10 0800) SpO2:  [83 %-100 %] 89 % (07/10 1000) Weight:  [119 kg (262 lb 5.6 oz)] 119 kg (262 lb 5.6 oz) (07/10 0231)  Weight change:  Filed Weights   02/22/16 0231  Weight: 119 kg (262 lb 5.6 oz)    Intake/Output: I/O last 3 completed shifts: In: -  Out: 300 [Urine:300]   Intake/Output this shift:  Total I/O In: 120 [P.O.:120] Out: 100 [Urine:100]  Physical Exam: General: NAD, seated in bed.   Head: Normocephalic, atraumatic. Moist oral mucosal membranes  Eyes: Anicteric, PERRL  Neck: Supple, trachea midline  Lungs:  Bilateral crackles  Heart: Tachycardia, irregular  Abdomen:  Soft, nontender, obese  Extremities: Trace to 1+ peripheral edema.  Neurologic: Nonfocal, moving all four extremities  Skin: No lesions       Basic Metabolic Panel:  Recent Labs Lab 02/21/16 2121  NA 141  K 4.6  CL 108  CO2 25  GLUCOSE 101*  BUN 24*  CREATININE 1.68*  CALCIUM 8.7*    Liver Function Tests: No results for input(s): AST, ALT, ALKPHOS, BILITOT, PROT, ALBUMIN in the last 168 hours. No results for input(s): LIPASE, AMYLASE in the last 168 hours. No results for input(s): AMMONIA in the last 168 hours.  CBC:  Recent Labs Lab 02/21/16 2121 02/22/16 0409  WBC 7.9 7.3  NEUTROABS 6.3 6.9*  HGB 9.0* 8.9*  HCT 29.4* 28.7*  MCV 80.7 79.6*   PLT 256 248    Cardiac Enzymes:  Recent Labs Lab 02/21/16 2121 02/22/16 0403  TROPONINI <0.03 <0.03    BNP: Invalid input(s): POCBNP  CBG:  Recent Labs Lab 02/22/16 0232  GLUCAP 174*    Microbiology: Results for orders placed or performed during the hospital encounter of 02/21/16  MRSA PCR Screening     Status: None   Collection Time: 02/22/16  2:32 AM  Result Value Ref Range Status   MRSA by PCR NEGATIVE NEGATIVE Final    Comment:        The GeneXpert MRSA Assay (FDA approved for NASAL specimens only), is one component of a comprehensive MRSA colonization surveillance program. It is not intended to diagnose MRSA infection nor to guide or monitor treatment for MRSA infections.     Coagulation Studies: No results for input(s): LABPROT, INR in the last 72 hours.  Urinalysis: No results for input(s): COLORURINE, LABSPEC, PHURINE, GLUCOSEU, HGBUR, BILIRUBINUR, KETONESUR, PROTEINUR, UROBILINOGEN, NITRITE, LEUKOCYTESUR in the last 72 hours.  Invalid input(s): APPERANCEUR    Imaging: Dg Chest Port 1 View  02/21/2016  CLINICAL DATA:  Respiratory distress, shortness of breath, cough EXAM: PORTABLE CHEST 1 VIEW COMPARISON:  01/02/2016 FINDINGS: Increased interstitial markings, mildly asymmetric on the left, possibly reflecting mild interstitial edema. Left upper lobe pneumonia is considered less likely. No definite pleural effusions. No pneumothorax. Cardiomegaly. IMPRESSION: Cardiomegaly with suspected mild interstitial edema. Left upper lobe pneumonia is considered less likely. Electronically Signed  By: Charline Bills M.D.   On: 02/21/2016 21:41     Medications:     . antiseptic oral rinse  7 mL Mouth Rinse BID  . aspirin EC  81 mg Oral Daily  . calcium carbonate  1.5 tablet Oral Daily  . cholecalciferol  1,000 Units Oral Daily  . diltiazem  180 mg Oral Daily  . enoxaparin (LOVENOX) injection  40 mg Subcutaneous BID  . furosemide  40 mg Intravenous Q12H   . hydrocortisone valerate cream  1 application Topical Daily  . insulin aspart  0-15 Units Subcutaneous TID WC  . insulin aspart  0-5 Units Subcutaneous QHS  . insulin glargine  30 Units Subcutaneous q morning - 10a  . ipratropium-albuterol  3 mL Nebulization Q6H  . [START ON 02/23/2016] levofloxacin (LEVAQUIN) IV  250 mg Intravenous Q24H  . levothyroxine  100 mcg Oral QAC breakfast  . lisinopril  5 mg Oral Daily  . methylPREDNISolone (SOLU-MEDROL) injection  60 mg Intravenous Q6H  . metoprolol tartrate  12.5 mg Oral BID  . pantoprazole  40 mg Oral QAC breakfast  . simvastatin  10 mg Oral Daily  . sodium chloride flush  3 mL Intravenous Q12H   sodium chloride, ALPRAZolam, guaiFENesin, ondansetron (ZOFRAN) IV, senna-docusate, sodium chloride flush, zolpidem  Assessment/ Plan:  [name] is a 82 y.o. white female with atrial fibrillation, hypertension, COPD, osteoarthritis, hyperlipidemia, osteopenia, allergic rhinitis, lumbar spinal stenosis presents with acute exacerbation of congestive heart failure and COPD  1. Chronic Kidney Disease stage IV with proteinuria: Creatinine does fluctuate due to cardiorenal syndrome.  Creatinine currently at baseline. Chronic Kidney Disease secondary to diabetic nephropathy and hypertensive nephrosclerosis  2. Hypertension with acute exacerbation of systolic CHF and Edema: with atrial fibrillation - Furosemide IV. Continue to monitor volume and respiratory status.   3. Anemia of chronic kidney disease: hemoglobin 8.9, microcytic - no epo given on this admission.    LOS: 0 Allison Shepard 7/10/201710:46 AM

## 2016-02-22 NOTE — Progress Notes (Signed)
Pt refused solu-medrol, stated that her primary MD told her not to take Prednisone because it is a dangerous drug. Pt also stated that she wanted to speak with Dr. Juliene PinaMody.

## 2016-02-22 NOTE — Consult Note (Signed)
[facility] Cardiology Consultation Note  Patient ID: RIVER MCKERCHER, MRN: [id number], DOB/AGE: [birth year] 80 y.o. Admit date: 02/21/2016   Date of Consult: 02/22/2016 Primary Physician: Mickey Farber, MD Primary Cardiologist: None  Chief Complaint:  Chief Complaint  Patient presents with  . Shortness of Breath   Reason for Consult:   HPI: 82 y.o. female with acute on chronic diastolic dysfunction heart failure likely secondary to exacerbation of COPD with hypoxia and respiratory distress. The patient had significant atrial fibrillation with rapid ventricular rate when seen with hypoxia but has improved significantly with heart rate control after improvements of hypoxia. There has been no evidence of current myocardial infarction with a normal troponin. Other exacerbating factors including anemia and chronic kidney disease stage III for which the patient has been appropriately treated. The patient currently is improving and his been discharged from the ICU after getting better heart rate control with diltiazem with an average heart rate 98 bpm. Additionally furosemide has helped with the lower pulmonary field edema and also exacerbation of COPD was treated with antibiotics and inhalers with and prednisone. The patient does have improved blood pressure control with metoprolol lisinopril combination. Simvastatin has been treating hyperlipidemia and stable  Past Medical History  Diagnosis Date  . COPD (chronic obstructive pulmonary disease) (HCC)   . Atrial fibrillation (HCC)   . Hypertension   . Hypercholesteremia   . Pneumonia   . Diabetes mellitus without complication (HCC)     type 2  . Headache     migraines - years ago  . Shortness of breath dyspnea   . Asthma   . Dysrhythmia     afib  . Chronic kidney disease     history of insufficiency  . Arthritis   . Spinal disorder     stenosis  . Complication of anesthesia     difficulty to wake up after an 8 hour back surgery   . Edema     legs/feet  . Wheezing   . CHF (congestive heart failure) [facility])       Surgical History:  Past Surgical History  Procedure Laterality Date  . Abdominal hysterectomy    . Shoulder fusion surgery Right   . Total knee arthroplasty Right     knee replacement  . Total hip arthroplasty Right     hip replacement  . Back surgery    . Cataract extraction      cataract surgery ? eye  . Fracture surgery Right     right arm 20 years ago  . Colonoscopy      x 3  . Orif distal radius fracture Left 06/17/2015  . Open reduction internal fixation (orif) distal radial fracture Left 06/17/2015    Procedure: OPEN REDUCTION INTERNAL FIXATION LEFT  DISTAL RADIUS AND ULNA FRACTURES;  Surgeon: Tarry Kos, MD;  Location: [facility] OR;  Service: Orthopedics;  Laterality: Left;  . Joint replacement    . Hernia repair    . Cataract extraction w/phaco Left 07/28/2015    Procedure: CATARACT EXTRACTION PHACO AND INTRAOCULAR LENS PLACEMENT (IOC);  Surgeon: Galen Manila, MD;  Location: [facility] ORS;  Service: Ophthalmology;  Laterality: Left;  [place] 2:16   . Esophagogastroduodenoscopy (egd) with propofol N/A 12/24/2015    Procedure: ESOPHAGOGASTRODUODENOSCOPY (EGD) WITH PROPOFOL;  Surgeon: [name], MD;  Location: [facility] ENDOSCOPY;  Service: Endoscopy;  Laterality: N/A;     Home Meds: Prior to Admission medications   Medication Sig Start Date End Date Taking? Authorizing Provider  albuterol (PROVENTIL HFA;VENTOLIN HFA) 108 (90 BASE) MCG/ACT inhaler Inhale 2 puffs into the lungs every 6 (six) hours as needed for wheezing or shortness of breath.   Yes Historical Provider, MD  albuterol (PROVENTIL) (2.5 MG/3ML) 0.083% nebulizer solution Take 2.5 mg by nebulization every 6 (six) hours as needed for wheezing or shortness of breath.   Yes Historical Provider, MD  aspirin EC 81 MG tablet Take 81 mg by mouth daily.   Yes Historical Provider, MD  calcium carbonate (TUMS EX) 750 MG chewable tablet Chew 1 tablet by  mouth daily.   Yes Historical Provider, MD  Cholecalciferol (VITAMIN D-3) 1000 UNITS CAPS Take 1,000 Units by mouth daily.   Yes Historical Provider, MD  diltiazem (CARDIZEM CD) 180 MG 24 hr capsule Take 180 mg by mouth daily.  09/10/15  Yes Historical Provider, MD  Fluticasone-Salmeterol (ADVAIR DISKUS) 250-50 MCG/DOSE AEPB Inhale 1 puff into the lungs 2 (two) times daily. 01/06/16  Yes Shaune Pollack, MD  furosemide (LASIX) 20 MG tablet Take 1 tablet (20 mg total) by mouth daily. 01/06/16  Yes Shaune Pollack, MD  guaiFENesin (ROBITUSSIN) 100 MG/5ML SOLN Take 5 mLs (100 mg total) by mouth every 4 (four) hours as needed for cough or to loosen phlegm. 01/06/16  Yes Shaune Pollack, MD  hydrocortisone valerate cream (WESTCORT) 0.2 % Apply 1 application topically daily.   Yes Historical Provider, MD  insulin aspart (NOVOLOG) 100 UNIT/ML injection Inject 8-12 Units into the skin 3 (three) times daily with meals. Below 200=8 units and every 50 units above she gets two extra units up to 12 units.   Yes Historical Provider, MD  insulin glargine (LANTUS) 100 UNIT/ML injection Inject 0.3 mLs (30 Units total) into the skin every morning. 01/06/16  Yes Shaune Pollack, MD  levothyroxine (SYNTHROID, LEVOTHROID) 100 MCG tablet Take 100 mcg by mouth daily before breakfast. 11/13/15  Yes Historical Provider, MD  metoprolol tartrate (LOPRESSOR) 25 MG tablet Take 0.5 tablets (12.5 mg total) by mouth 2 (two) times daily. 01/06/16  Yes Shaune Pollack, MD  omeprazole (PRILOSEC) 20 MG capsule Take 20 mg by mouth daily.   Yes Historical Provider, MD  senna-docusate (SENOKOT-S) 8.6-50 MG tablet Take 1 tablet by mouth at bedtime as needed for mild constipation. 01/06/16  Yes Shaune Pollack, MD  simvastatin (ZOCOR) 10 MG tablet Take 10 mg by mouth daily.   Yes Historical Provider, MD  zolpidem (AMBIEN) 5 MG tablet Take 5 mg by mouth at bedtime as needed for sleep. Reported on 09/08/2015   Yes Historical Provider, MD    Inpatient Medications:  . antiseptic oral  rinse  7 mL Mouth Rinse BID  . aspirin EC  81 mg Oral Daily  . calcium carbonate  1.5 tablet Oral Daily  . cholecalciferol  1,000 Units Oral Daily  . diltiazem  180 mg Oral Daily  . enoxaparin (LOVENOX) injection  40 mg Subcutaneous BID  . furosemide  40 mg Intravenous Q12H  . hydrocortisone valerate cream  1 application Topical Daily  . insulin aspart  0-15 Units Subcutaneous TID WC  . insulin aspart  0-5 Units Subcutaneous QHS  . insulin glargine  30 Units Subcutaneous q morning - 10a  . ipratropium-albuterol  3 mL Nebulization Q6H  . [START ON 02/23/2016] levofloxacin (LEVAQUIN) IV  250 mg Intravenous Q24H  . levothyroxine  100 mcg Oral QAC breakfast  . lisinopril  5 mg Oral Daily  . methylPREDNISolone (SOLU-MEDROL) injection  60 mg Intravenous Q6H  . metoprolol tartrate  12.5 mg Oral BID  . pantoprazole  40 mg Oral QAC breakfast  . simvastatin  10 mg Oral Daily  . sodium chloride flush  3 mL Intravenous Q12H      Allergies:  Allergies  Allergen Reactions  . Hydrocodone-Chlorpheniramine Other (See Comments)    Confusion  . Hydromorphone Hcl Nausea And Vomiting  . Ibuprofen Other (See Comments)    Pt was told by her MD not to take this medication.    . Mucinex [Guaifenesin Er] Other (See Comments)    Red all over. Felt like i was on fire  . Percocet [Oxycodone-Acetaminophen] Hives    Social History   Social History  . Marital Status: Married    Spouse Name: N/A  . Number of Children: 2  . Years of Education: N/A   Occupational History  . retired    Social History Main Topics  . Smoking status: Former Smoker    Quit date: 06/15/1992  . Smokeless tobacco: Never Used  . Alcohol Use: 8.4 oz/week    7 Glasses of wine, 7 Shots of liquor, 0 Standard drinks or equivalent per week     Comment: daily  . Drug Use: No  . Sexual Activity: Not on file   Other Topics Concern  . Not on file   Social History Narrative     Family History  Problem Relation Age of Onset   . Heart disease Father      Review of Systems Positive for Shortness of breath Negative for: General:  chills, fever, night sweats or weight changes.  Cardiovascular: PND orthopnea syncope dizziness  Dermatological skin lesions rashes Respiratory: Cough congestion Urologic: Frequent urination urination at night and hematuria Abdominal: negative for nausea, vomiting, diarrhea, bright red blood per rectum, melena, or hematemesis Neurologic: negative for visual changes, and/or hearing changes  All other systems reviewed and are otherwise negative except as noted above.  Labs:  Recent Labs  02/21/16 2121 02/22/16 0403 02/22/16 1019  TROPONINI <0.03 <0.03 <0.03   Lab Results  Component Value Date   WBC 7.3 02/22/2016   HGB 8.9* 02/22/2016   HCT 28.7* 02/22/2016   MCV 79.6* 02/22/2016   PLT 248 02/22/2016    Recent Labs Lab 02/21/16 2121  NA 141  K 4.6  CL 108  CO2 25  BUN 24*  CREATININE 1.68*  CALCIUM 8.7*  GLUCOSE 101*   No results found for: CHOL, HDL, LDLCALC, TRIG No results found for: DDIMER  Radiology/Studies:  Dg Chest Port 1 View  02/21/2016  CLINICAL DATA:  Respiratory distress, shortness of breath, cough EXAM: PORTABLE CHEST 1 VIEW COMPARISON:  01/02/2016 FINDINGS: Increased interstitial markings, mildly asymmetric on the left, possibly reflecting mild interstitial edema. Left upper lobe pneumonia is considered less likely. No definite pleural effusions. No pneumothorax. Cardiomegaly. IMPRESSION: Cardiomegaly with suspected mild interstitial edema. Left upper lobe pneumonia is considered less likely. Electronically Signed   By: Charline Bills M.D.   On: 02/21/2016 21:41   Dg Finger Ring Left  01/30/2016  CLINICAL DATA:  Injury to the left fourth finger. Finger got caught and wheelchair and bent backwards. Fingernail is dark purple. Multiple lacerations with bleeding wounds. History of diabetes. EXAM: LEFT RING FINGER 2+V COMPARISON:  None. FINDINGS: Crush  fractures of the distal phalangeal tuft of the left fourth finger with distraction of fracture fragments. Soft tissue gas is present suggesting an open fracture. Degenerative changes in the interphalangeal joints. No expansile bone lesions. IMPRESSION: Comminuted crush fractures of the distal  phalangeal tuft of the left fourth finger with soft tissue gas suggesting an open fracture. Electronically Signed   By: Burman NievesWilliam  Stevens M.D.   On: 01/30/2016 23:06    EKG: Atrial fibrillation with rapid ventricular rate  Weights: Filed Weights   02/22/16 0231  Weight: 262 lb 5.6 oz (119 kg)     Physical Exam: Blood pressure 155/133, pulse 106, temperature 97.8 F (36.6 C), temperature source Oral, resp. rate 21, height 5\' 5"  (1.651 m), weight 262 lb 5.6 oz (119 kg), SpO2 100 %. Body mass index is 43.66 kg/(m^2). General: Well developed, well nourished, in no acute distress. Head eyes ears nose throat: Normocephalic, atraumatic, sclera non-icteric, no xanthomas, nares are without discharge. No apparent thyromegaly and/or mass  Lungs: Normal respiratory effort.  Diffuse wheezes, basilar rales, some rhonchi.  Heart: Irregular with normal S1 S2. no murmur gallop, no rub, PMI is normal size and placement, carotid upstroke normal without bruit, jugular venous pressure is normal Abdomen: Soft, non-tender, non-distended with normoactive bowel sounds. No hepatomegaly. No rebound/guarding. No obvious abdominal masses. Abdominal aorta is normal size without bruit Extremities: Trace edema. no cyanosis, no clubbing, no ulcers  Peripheral : 2+ bilateral upper extremity pulses, 2+ bilateral femoral pulses, 2+ bilateral dorsal pedal pulse Neuro: Alert and oriented. No facial asymmetry. No focal deficit. Moves all extremities spontaneously. Musculoskeletal: Normal muscle tone without kyphosis Psych:  Responds to questions appropriately with a normal affect.    Assessment: 82 year old female with chronic obstructive  pulmonary disease with exacerbation and infection with hypoxia likely causing diastolic dysfunction heart failure and atrial fibrillation with rapid ventricular rate now improving without current evidence of myocardial infarction  Plan: 1. Continue metoprolol diltiazem combination for heart rate control of atrial fibrillation 2. Consider anticoagulation although currently patient is anemic and would hold off due to concerns of bleeding complications and severe anemia requiring further intervention. 3. Continue ACE inhibitor for hypertension control 4. Watch closely for exacerbation or worsening of chronic kidney disease 5. Antibiotics for exacerbation of COPD 6. Echocardiogram for LV systolic dysfunction valvular heart disease contributing to above 7.  Begin ambulation and follow for improvements of above  Signed, Lamar BlinksKOWALSKI,Kamerin Axford J M.D. [facility] 02/22/2016, 6:39 PM

## 2016-02-22 NOTE — ED Notes (Signed)
Dr. Pyreddy at bedside  

## 2016-02-22 NOTE — Care Management (Signed)
CM assessment for discharge planning. Met with patient at bedside. She lives at home with her husband. Uses a walker and home O2 @ 3L. She does not know the provider. Patient is a smoker. Discussed home health needs and patient refused stating all she needed was someone to clean the house. Husband outside of room while patient was being cleaned. Discussed home health with him and the goals of care. He states that her pulmonologist, Dr. Alva Garnet, recommended hospice services. Patient made reference to hospice during our conversation but said she didn't take the services because they "would'nt pick up a vacuum". Husband ask about hospice services and is interested in having therm come out. He states her care is great and he could benefit from the help, especially with bathing. He stated he realized her prognosis is poor and time is limited. He would like to have a hospice consult. He chooses [facility].  Spoke with  Dr. Benjie Karvonen. She will order a hospice consult.  Referral called to [facility].

## 2016-02-22 NOTE — ED Notes (Signed)
Pt provided graham crackers and something to drink by Waynetta SandyBeth, EDT.

## 2016-02-22 NOTE — Progress Notes (Signed)
*  PRELIMINARY RESULTS* Echocardiogram 2D Echocardiogram has been performed.  Cristela BlueHege, Javid Kemler 02/22/2016, 3:03 PM

## 2016-02-22 NOTE — H&P (Signed)
[facility] [place] at [facility]   PATIENT NAME: Allison Shepard    MR#:  [id number]  DATE OF BIRTH:  1934-03-17  DATE OF ADMISSION:  02/21/2016  PRIMARY CARE PHYSICIAN: Mickey FarberHIES, DAVID, MD   REQUESTING/REFERRING PHYSICIAN:   CHIEF COMPLAINT:   Chief Complaint  Patient presents with  . Shortness of Breath    HISTORY OF PRESENT ILLNESS: Allison Shepard  is a 82 y.o. female with a known history of COPD on home oxygen, congestive heart failure, atrial fibrillation, hypertension, hyperlipidemia, type 2 diabetes mellitus presented to the emergency room with increased shortness of breath for the last couple of days. Patient uses oxygen via nasal cannula continuously throughout the day. She felt more short of breath for the last few days and has been retaining more fluid in the legs. She has been compliant with her medication. No complaints of chest pain. Has history of orthopnea and paroxysmal nocturnal dyspnea. Patient initially was satting around 70% on oxygen at 15 L. She was put on BiPAP and stabilized and later on weaned to oxygen via nasal cannula at 4 L. Patient was given nebulization treatment and Solu-Medrol in the emergency room. She was also given Lasix for diuresis. Workup with a chest x-ray showed interstitial edema. Hospitalist service was consulted for further care of the patient.  PAST MEDICAL HISTORY:   Past Medical History  Diagnosis Date  . COPD (chronic obstructive pulmonary disease) (HCC)   . Atrial fibrillation (HCC)   . Hypertension   . Hypercholesteremia   . Pneumonia   . Diabetes mellitus without complication (HCC)     type 2  . Headache     migraines - years ago  . Shortness of breath dyspnea   . Asthma   . Dysrhythmia     afib  . Chronic kidney disease     history of insufficiency  . Arthritis   . Spinal disorder     stenosis  . Complication of anesthesia     difficulty to wake up after an 8 hour back surgery  . Edema      legs/feet  . Wheezing   . CHF (congestive heart failure) (HCC)     PAST SURGICAL HISTORY: Past Surgical History  Procedure Laterality Date  . Abdominal hysterectomy    . Shoulder fusion surgery Right   . Total knee arthroplasty Right     knee replacement  . Total hip arthroplasty Right     hip replacement  . Back surgery    . Cataract extraction      cataract surgery ? eye  . Fracture surgery Right     right arm 20 years ago  . Colonoscopy      x 3  . Orif distal radius fracture Left 06/17/2015  . Open reduction internal fixation (orif) distal radial fracture Left 06/17/2015    Procedure: OPEN REDUCTION INTERNAL FIXATION LEFT  DISTAL RADIUS AND ULNA FRACTURES;  Surgeon: Tarry KosNaiping M Xu, MD;  Location: [facility] OR;  Service: Orthopedics;  Laterality: Left;  . Joint replacement    . Hernia repair    . Cataract extraction w/phaco Left 07/28/2015    Procedure: CATARACT EXTRACTION PHACO AND INTRAOCULAR LENS PLACEMENT (IOC);  Surgeon: Galen ManilaWilliam Porfilio, MD;  Location: [facility] ORS;  Service: Ophthalmology;  Laterality: Left;  US 2:16   . Esophagogastroduodenoscopy (egd) with propofol N/A 12/24/2015    Procedure: ESOPHAGOGASTRODUODENOSCOPY (EGD) WITH PROPOFOL;  Surgeon: Rachael Feeaniel P Jacobs, MD;  Location: [facility] ENDOSCOPY;  Service: Endoscopy;  Laterality:  N/A;    SOCIAL HISTORY:  Social History  Substance Use Topics  . Smoking status: Former Smoker    Quit date: 06/15/1992  . Smokeless tobacco: Never Used  . Alcohol Use: 8.4 oz/week    7 Glasses of wine, 7 Shots of liquor, 0 Standard drinks or equivalent per week     Comment: daily    FAMILY HISTORY:  Family History  Problem Relation Age of Onset  . Heart disease Father     DRUG ALLERGIES:  Allergies  Allergen Reactions  . Hydrocodone-Chlorpheniramine Other (See Comments)    Confusion  . Hydromorphone Hcl Nausea And Vomiting  . Ibuprofen Other (See Comments)    Pt was told by her MD not to take this medication.    . Mucinex [Guaifenesin  Er] Other (See Comments)    Red all over. Felt like i was on fire  . Percocet [Oxycodone-Acetaminophen] Hives    REVIEW OF SYSTEMS:   CONSTITUTIONAL: No fever, fatigue or weakness.  EYES: No blurred or double vision.  EARS, NOSE, AND THROAT: No tinnitus or ear pain.  RESPIRATORY: Has occasional cough, has shortness of breath, wheezing , no hemoptysis.  CARDIOVASCULAR: No chest pain,has orthopnea and edema.  GASTROINTESTINAL: No nausea, vomiting, diarrhea or abdominal pain.  GENITOURINARY: No dysuria, hematuria.  ENDOCRINE: No polyuria, nocturia,  HEMATOLOGY: No anemia, easy bruising or bleeding SKIN: No rash or lesion. MUSCULOSKELETAL: No joint pain or arthritis.   NEUROLOGIC: No tingling, numbness, weakness.  PSYCHIATRY: No anxiety or depression.   MEDICATIONS AT HOME:  Prior to Admission medications   Medication Sig Start Date End Date Taking? Authorizing Provider  albuterol (PROVENTIL HFA;VENTOLIN HFA) 108 (90 BASE) MCG/ACT inhaler Inhale 2 puffs into the lungs every 6 (six) hours as needed for wheezing or shortness of breath.   Yes Historical Provider, MD  albuterol (PROVENTIL) (2.5 MG/3ML) 0.083% nebulizer solution Take 2.5 mg by nebulization every 6 (six) hours as needed for wheezing or shortness of breath.   Yes Historical Provider, MD  aspirin EC 81 MG tablet Take 81 mg by mouth daily.   Yes Historical Provider, MD  calcium carbonate (TUMS EX) 750 MG chewable tablet Chew 1 tablet by mouth daily.   Yes Historical Provider, MD  Cholecalciferol (VITAMIN D-3) 1000 UNITS CAPS Take 1,000 Units by mouth daily.   Yes Historical Provider, MD  diltiazem (CARDIZEM CD) 180 MG 24 hr capsule Take 180 mg by mouth daily.  09/10/15  Yes Historical Provider, MD  Fluticasone-Salmeterol (ADVAIR DISKUS) 250-50 MCG/DOSE AEPB Inhale 1 puff into the lungs 2 (two) times daily. 01/06/16  Yes Shaune Pollack, MD  furosemide (LASIX) 20 MG tablet Take 1 tablet (20 mg total) by mouth daily. 01/06/16  Yes Shaune Pollack,  MD  guaiFENesin (ROBITUSSIN) 100 MG/5ML SOLN Take 5 mLs (100 mg total) by mouth every 4 (four) hours as needed for cough or to loosen phlegm. 01/06/16  Yes Shaune Pollack, MD  hydrocortisone valerate cream (WESTCORT) 0.2 % Apply 1 application topically daily.   Yes Historical Provider, MD  insulin aspart (NOVOLOG) 100 UNIT/ML injection Inject 8-12 Units into the skin 3 (three) times daily with meals. Below 200=8 units and every 50 units above she gets two extra units up to 12 units.   Yes Historical Provider, MD  insulin glargine (LANTUS) 100 UNIT/ML injection Inject 0.3 mLs (30 Units total) into the skin every morning. 01/06/16  Yes Shaune Pollack, MD  levothyroxine (SYNTHROID, LEVOTHROID) 100 MCG tablet Take 100 mcg by mouth daily before  breakfast. 11/13/15  Yes Historical Provider, MD  metoprolol tartrate (LOPRESSOR) 25 MG tablet Take 0.5 tablets (12.5 mg total) by mouth 2 (two) times daily. 01/06/16  Yes Shaune Pollack, MD  omeprazole (PRILOSEC) 20 MG capsule Take 20 mg by mouth daily.   Yes Historical Provider, MD  senna-docusate (SENOKOT-S) 8.6-50 MG tablet Take 1 tablet by mouth at bedtime as needed for mild constipation. 01/06/16  Yes Shaune Pollack, MD  simvastatin (ZOCOR) 10 MG tablet Take 10 mg by mouth daily.   Yes Historical Provider, MD  zolpidem (AMBIEN) 5 MG tablet Take 5 mg by mouth at bedtime as needed for sleep. Reported on 09/08/2015   Yes Historical Provider, MD      PHYSICAL EXAMINATION:   VITAL SIGNS: Blood pressure 130/85, pulse 113, resp. rate 12, SpO2 97 %.  GENERAL:  82 y.o.-year-old obese female patient lying in the bed in mild distress.  EYES: Pupils equal, round, reactive to light and accommodation. No scleral icterus.pallor present. Extraocular muscles intact.  HEENT: Head atraumatic, normocephalic. Oropharynx and nasopharynx clear.  NECK:  Supple, no jugular venous distention. No thyroid enlargement, no tenderness.  LUNGS: Decreased breath sounds bilaterally, scattered wheezing  bilaterllay, rales,basal crepitations heard. No use of accessory muscles of respiration.  CARDIOVASCULAR: S1, S2 normal. No murmurs, rubs, or gallops.  ABDOMEN: Soft,obese, nontender, nondistended. Bowel sounds present. No organomegaly or mass.  EXTREMITIES: Has pedal edema, no cyanosis, or clubbing.  NEUROLOGIC: Cranial nerves II through XII are intact. Muscle strength 5/5 in all extremities. Sensation intact. Gait not checked.  PSYCHIATRIC: The patient is alert and oriented x 3.  SKIN: No obvious rash, lesion, or ulcer.   LABORATORY PANEL:   CBC  Recent Labs Lab 02/21/16 2121  WBC 7.9  HGB 9.0*  HCT 29.4*  PLT 256  MCV 80.7  MCH 24.6*  MCHC 30.5*  RDW 20.8*  LYMPHSABS 0.7*  MONOABS 0.7  EOSABS 0.1  BASOSABS 0.1   ------------------------------------------------------------------------------------------------------------------  Chemistries   Recent Labs Lab 02/21/16 2121  NA 141  K 4.6  CL 108  CO2 25  GLUCOSE 101*  BUN 24*  CREATININE 1.68*  CALCIUM 8.7*   ------------------------------------------------------------------------------------------------------------------ CrCl cannot be calculated (Unknown ideal weight.). ------------------------------------------------------------------------------------------------------------------ No results for input(s): TSH, T4TOTAL, T3FREE, THYROIDAB in the last 72 hours.  Invalid input(s): FREET3   Coagulation profile No results for input(s): INR, PROTIME in the last 168 hours. ------------------------------------------------------------------------------------------------------------------- No results for input(s): DDIMER in the last 72 hours. -------------------------------------------------------------------------------------------------------------------  Cardiac Enzymes  Recent Labs Lab 02/21/16 2121  TROPONINI <0.03    ------------------------------------------------------------------------------------------------------------------ Invalid input(s): POCBNP  ---------------------------------------------------------------------------------------------------------------  Urinalysis    Component Value Date/Time   COLORURINE YELLOW* 12/31/2015 1039   COLORURINE Yellow 02/13/2013 1512   APPEARANCEUR CLEAR* 12/31/2015 1039   APPEARANCEUR Clear 02/13/2013 1512   LABSPEC 1.009 12/31/2015 1039   LABSPEC 1.020 02/13/2013 1512   PHURINE 5.0 12/31/2015 1039   PHURINE 7.0 02/13/2013 1512   GLUCOSEU NEGATIVE 12/31/2015 1039   GLUCOSEU Negative 02/13/2013 1512   HGBUR NEGATIVE 12/31/2015 1039   HGBUR 1+ 02/13/2013 1512   BILIRUBINUR NEGATIVE 12/31/2015 1039   BILIRUBINUR Negative 02/13/2013 1512   KETONESUR NEGATIVE 12/31/2015 1039   KETONESUR Negative 02/13/2013 1512   PROTEINUR NEGATIVE 12/31/2015 1039   PROTEINUR >=500 02/13/2013 1512   UROBILINOGEN 1.0 11/13/2009 1817   NITRITE NEGATIVE 12/31/2015 1039   NITRITE Negative 02/13/2013 1512   LEUKOCYTESUR NEGATIVE 12/31/2015 1039   LEUKOCYTESUR Negative 02/13/2013 1512     RADIOLOGY: Dg Chest Port 1 [address]  02/21/2016  CLINICAL DATA:  Respiratory distress, shortness of breath, cough EXAM: PORTABLE CHEST 1 VIEW COMPARISON:  01/02/2016 FINDINGS: Increased interstitial markings, mildly asymmetric on the left, possibly reflecting mild interstitial edema. Left upper lobe pneumonia is considered less likely. No definite pleural effusions. No pneumothorax. Cardiomegaly. IMPRESSION: Cardiomegaly with suspected mild interstitial edema. Left upper lobe pneumonia is considered less likely. Electronically Signed   By: Charline Bills M.D.   On: 02/21/2016 21:41    EKG: Orders placed or performed during the hospital encounter of 02/21/16  . ED EKG  . ED EKG    IMPRESSION AND PLAN: 82 year old female patient is to of COPD on home oxygen, congestive heart  failure, type 2 diabetes mellitus insulin-dependent, hypertension presented to the emergency room with increased shortness of breath. Patient was initially stabilized on BiPAP and weaned down to oxygen by nasal cannula. Admitting diagnosis 1. Acute on chronic respiratory failure 2. Decompensated heart failure 3. Acute COPD exacerbation 4. Type 2 diabetes mellitus Treatment plan Admit patient to stepdown unit Diurese patient with IV Lasix 40 MG every 12 hourly Nebulization treatment around-the-clock IV Solu-Medrol 60 MG every 6 hourly DVT prophylaxis with subcutaneous Lovenox 40 MG daily Cycle troponin to rule out ischemia Monitor electrolytes Oxygen via nasal cannula at 4 L Cardiology consultation. Supportive care.   All the records are reviewed and case discussed with ED provider. Management plans discussed with the patient, family and they are in agreement.  CODE STATUS:FULL Code Status History    Date Active Date Inactive Code Status Order ID Comments User Context   12/31/2015  5:11 PM 01/06/2016  8:08 PM Full Code [id number]  Enedina Finner, MD Inpatient   07/17/2015  5:28 PM 07/20/2015  5:46 PM Full Code [id number]  Katharina Caper, MD Inpatient   06/17/2015  5:27 PM 06/19/2015  5:50 PM Full Code [id number]  Tarry Kos, MD Inpatient   04/06/2015 11:36 AM 04/14/2015  7:29 PM Full Code [id number]  Delfino Lovett, MD Inpatient   03/16/2015 11:41 PM 03/20/2015  5:03 PM Full Code [id number]  Ramonita Lab, MD Inpatient       TOTAL TIME TAKING CARE OF THIS PATIENT: 53 minutes.    Ihor Austin M.D on 02/22/2016 at 1:00 AM  Between 7am to 6pm - Pager - [phone]  After 6pm go to [email or web address] - password [email or web address]  [facility]  Office  [phone]  CC: Primary care physician; Mickey Farber, MD

## 2016-02-23 LAB — CBC
HCT: 27.8 % — ABNORMAL LOW (ref 35.0–47.0)
Hemoglobin: 8.5 g/dL — ABNORMAL LOW (ref 12.0–16.0)
MCH: 24.3 pg — AB (ref 26.0–34.0)
MCHC: 30.6 g/dL — AB (ref 32.0–36.0)
MCV: 79.2 fL — ABNORMAL LOW (ref 80.0–100.0)
PLATELETS: 247 10*3/uL (ref 150–440)
RBC: 3.51 MIL/uL — AB (ref 3.80–5.20)
RDW: 21.4 % — ABNORMAL HIGH (ref 11.5–14.5)
WBC: 7.8 10*3/uL (ref 3.6–11.0)

## 2016-02-23 LAB — BASIC METABOLIC PANEL
Anion gap: 7 (ref 5–15)
BUN: 41 mg/dL — ABNORMAL HIGH (ref 6–20)
CALCIUM: 8.7 mg/dL — AB (ref 8.9–10.3)
CO2: 29 mmol/L (ref 22–32)
CREATININE: 1.79 mg/dL — AB (ref 0.44–1.00)
Chloride: 105 mmol/L (ref 101–111)
GFR calc non Af Amer: 26 mL/min — ABNORMAL LOW (ref >60)
GFR, EST AFRICAN AMERICAN: 30 mL/min — AB (ref >60)
Glucose, Bld: 194 mg/dL — ABNORMAL HIGH (ref 65–99)
Potassium: 4.5 mmol/L (ref 3.5–5.1)
Sodium: 141 mmol/L (ref 135–145)

## 2016-02-23 LAB — GLUCOSE, CAPILLARY
GLUCOSE-CAPILLARY: 204 mg/dL — AB (ref 65–99)
GLUCOSE-CAPILLARY: 233 mg/dL — AB (ref 65–99)

## 2016-02-23 MED ORDER — FUROSEMIDE 20 MG PO TABS
40.0000 mg | ORAL_TABLET | Freq: Every day | ORAL | Status: DC
Start: 2016-02-23 — End: 2016-03-08

## 2016-02-23 MED ORDER — INSULIN ASPART 100 UNIT/ML ~~LOC~~ SOLN
5.0000 [IU] | Freq: Three times a day (TID) | SUBCUTANEOUS | Status: DC
  Administered 2016-02-23: 5 [IU] via SUBCUTANEOUS
  Filled 2016-02-23: qty 5

## 2016-02-23 MED ORDER — LEVOFLOXACIN 250 MG PO TABS: 250.0000 mg | ORAL_TABLET | Freq: Every day | ORAL | Status: DC

## 2016-02-23 NOTE — Progress Notes (Signed)
Initial Heart Failure Clinic appointment scheduled for March 01, 2016 at 11:00am. Thank you.

## 2016-02-23 NOTE — Discharge Instructions (Signed)
Heart Failure Clinic appointment on March 01, 2016 at 11:00am with Allison Kindredina Jessie Cowher, FNP. Please call [phone] to reschedule.

## 2016-02-23 NOTE — Discharge Summary (Signed)
[facility] - [place] at [facility]   PATIENT NAME: Allison Shepard    MR#:  [id number]  DATE OF BIRTH:  May 27, 1934  DATE OF ADMISSION:  02/21/2016 ADMITTING PHYSICIAN: Ihor Austin, MD  DATE OF DISCHARGE: 02/23/2016  PRIMARY CARE PHYSICIAN: Harrington Challenger, DAVID, MD    ADMISSION DIAGNOSIS:  Anasarca [R60.1] Dyspnea [R06.00] Congestive heart failure, unspecified congestive heart failure chronicity, unspecified congestive heart failure type (HCC) [I50.9] Chronic obstructive pulmonary disease, unspecified COPD type (HCC) [J44.9]  DISCHARGE DIAGNOSIS:  Active Problems:   Dyspnea   SECONDARY DIAGNOSIS:   Past Medical History  Diagnosis Date  . COPD (chronic obstructive pulmonary disease) (HCC)   . Atrial fibrillation (HCC)   . Hypertension   . Hypercholesteremia   . Pneumonia   . Diabetes mellitus without complication (HCC)     type 2  . Headache     migraines - years ago  . Shortness of breath dyspnea   . Asthma   . Dysrhythmia     afib  . Chronic kidney disease     history of insufficiency  . Arthritis   . Spinal disorder     stenosis  . Complication of anesthesia     difficulty to wake up after an 8 hour back surgery  . Edema     legs/feet  . Wheezing   . CHF (congestive heart failure) [facility])     HOSPITAL COURSE:   82 year female with a history of COPD on home oxygen, diastolic heart failure and morbid obesity who presents with shortness of breath and found to have acute on chronic heart failure.  1. Acute on chronic hypoxic respiratory failure, due to decompensated diastolic heart failure. Her symptoms have improved with IV Lasix.Marland Kitchen  2. Acute on chronic diastolic heart failure with lower extremity edema and anasarca: echocardiogram shows normal ejection fraction with diastolic dysfunction. IV Lasix is improved. She will be discharged with by mouth Lasix. She only close follow-up with her nephrologist and cardiologist.   3. Atrial fibrillation:  Continue diltiazem and metoprolol for heart rate control. She is not on anticoagulation due to history of GI bleed.  4. Diabetes: Continue present medications and ADA diet 5. CAP: Continue Levaquinfor 5 more days.  6. Hypothyroid: Continue Synthroid  7. Essential hypertension: Continue metoprolol, lisinopril and diltiazem.  8. Chronic kidney disease stage IV with proteinuria: Continue ACE inhibitor and management as per nephrology. She will follow-up with her nephrologist in 3 days. 9. Anemia of chronic kidney disease: slight drop in hemoglobin but no indication for transfusion.   DISCHARGE CONDITIONS AND DIET:   Patient will be discharged to home with hospice on heart healthy/diabetic renal diet  CONSULTS OBTAINED:  Treatment Team:  Lamar Blinks, MD  DRUG ALLERGIES:   Allergies  Allergen Reactions  . Hydrocodone-Chlorpheniramine Other (See Comments)    Confusion  . Hydromorphone Hcl Nausea And Vomiting  . Ibuprofen Other (See Comments)    Pt was told by her MD not to take this medication.    . Mucinex [Guaifenesin Er] Other (See Comments)    Red all over. Felt like i was on fire  . Percocet [Oxycodone-Acetaminophen] Hives    DISCHARGE MEDICATIONS:   Current Discharge Medication List    START taking these medications   Details  levofloxacin (LEVAQUIN) 250 MG tablet Take 1 tablet (250 mg total) by mouth daily. Qty: 5 tablet, Refills: 5      CONTINUE these medications which have CHANGED   Details  furosemide (LASIX)  20 MG tablet Take 2 tablets (40 mg total) by mouth daily. Qty: 30 tablet, Refills: 1      CONTINUE these medications which have NOT CHANGED   Details  albuterol (PROVENTIL HFA;VENTOLIN HFA) 108 (90 BASE) MCG/ACT inhaler Inhale 2 puffs into the lungs every 6 (six) hours as needed for wheezing or shortness of breath.    albuterol (PROVENTIL) (2.5 MG/3ML) 0.083% nebulizer solution Take 2.5 mg by nebulization every 6 (six) hours as needed for  wheezing or shortness of breath.    aspirin EC 81 MG tablet Take 81 mg by mouth daily.    calcium carbonate (TUMS EX) 750 MG chewable tablet Chew 1 tablet by mouth daily.    Cholecalciferol (VITAMIN D-3) 1000 UNITS CAPS Take 1,000 Units by mouth daily.    diltiazem (CARDIZEM CD) 180 MG 24 hr capsule Take 180 mg by mouth daily.     Fluticasone-Salmeterol (ADVAIR DISKUS) 250-50 MCG/DOSE AEPB Inhale 1 puff into the lungs 2 (two) times daily. Qty: 60 each, Refills: 0    guaiFENesin (ROBITUSSIN) 100 MG/5ML SOLN Take 5 mLs (100 mg total) by mouth every 4 (four) hours as needed for cough or to loosen phlegm. Qty: 118 mL, Refills: 0    hydrocortisone valerate cream (WESTCORT) 0.2 % Apply 1 application topically daily.    insulin aspart (NOVOLOG) 100 UNIT/ML injection Inject 8-12 Units into the skin 3 (three) times daily with meals. Below 200=8 units and every 50 units above she gets two extra units up to 12 units.    insulin glargine (LANTUS) 100 UNIT/ML injection Inject 0.3 mLs (30 Units total) into the skin every morning. Qty: 10 mL, Refills: 3    levothyroxine (SYNTHROID, LEVOTHROID) 100 MCG tablet Take 100 mcg by mouth daily before breakfast. Refills: 11    metoprolol tartrate (LOPRESSOR) 25 MG tablet Take 0.5 tablets (12.5 mg total) by mouth 2 (two) times daily.    omeprazole (PRILOSEC) 20 MG capsule Take 20 mg by mouth daily.    senna-docusate (SENOKOT-S) 8.6-50 MG tablet Take 1 tablet by mouth at bedtime as needed for mild constipation.    simvastatin (ZOCOR) 10 MG tablet Take 10 mg by mouth daily.    zolpidem (AMBIEN) 5 MG tablet Take 5 mg by mouth at bedtime as needed for sleep. Reported on 09/08/2015              Today   CHIEF COMPLAINT:   Patient wants the Foley out. She wants to go home. Her breathing has improved. Lower extremity edema has improved.   VITAL SIGNS:  Blood pressure 140/83, pulse 83, temperature 97.9 F (36.6 C), temperature source Oral, resp.  rate 18, height 5\' 5"  (1.651 m), weight 118.978 kg (262 lb 4.8 oz), SpO2 97 %.   REVIEW OF SYSTEMS:  Review of Systems  Constitutional: Negative for fever, chills and malaise/fatigue.  HENT: Negative for ear discharge, ear pain, hearing loss, nosebleeds and sore throat.   Eyes: Negative for blurred vision and pain.  Respiratory: Negative for cough, hemoptysis, shortness of breath and wheezing.   Cardiovascular: Positive for leg swelling. Negative for chest pain and palpitations.  Gastrointestinal: Negative for nausea, vomiting, abdominal pain, diarrhea and blood in stool.  Genitourinary: Negative for dysuria.  Musculoskeletal: Negative for back pain.  Neurological: Negative for dizziness, tremors, speech change, focal weakness, seizures and headaches.  Endo/Heme/Allergies: Does not bruise/bleed easily.  Psychiatric/Behavioral: Negative for depression, suicidal ideas and hallucinations.     PHYSICAL EXAMINATION:  GENERAL:  82 y.o.-year-old patient  lying in the bed with no acute distress.  NECK:  Supple, no jugular venous distention. No thyroid enlargement, no tenderness.  LUNGS: Normal breath sounds bilaterally, no wheezing, rales,rhonchi  No use of accessory muscles of respiration.  CARDIOVASCULAR: S1, S2 normal. No murmurs, rubs, or gallops.  ABDOMEN: Soft, non-tender, non-distended. Bowel sounds present. Heart to appreciate organomegaly or mass due to body habitus  EXTREMITIES: 2+ edema, nocyanosis, or clubbing.  PSYCHIATRIC: The patient is alert and oriented x 3.  SKIN: No obvious rash, lesion, or ulcer.   DATA REVIEW:   CBC  Recent Labs Lab 02/23/16 0552  WBC 7.8  HGB 8.5*  HCT 27.8*  PLT 247    Chemistries   Recent Labs Lab 02/23/16 0552  NA 141  K 4.5  CL 105  CO2 29  GLUCOSE 194*  BUN 41*  CREATININE 1.79*  CALCIUM 8.7*    Cardiac Enzymes  Recent Labs Lab 02/21/16 2121 02/22/16 0403 02/22/16 1019  TROPONINI <0.03 <0.03 <0.03    Microbiology  Results  @[email or web address] @  RADIOLOGY:  Dg Chest Port 1 View  02/21/2016  CLINICAL DATA:  Respiratory distress, shortness of breath, cough EXAM: PORTABLE CHEST 1 VIEW COMPARISON:  01/02/2016 FINDINGS: Increased interstitial markings, mildly asymmetric on the left, possibly reflecting mild interstitial edema. Left upper lobe pneumonia is considered less likely. No definite pleural effusions. No pneumothorax. Cardiomegaly. IMPRESSION: Cardiomegaly with suspected mild interstitial edema. Left upper lobe pneumonia is considered less likely. Electronically Signed   By: Charline Bills M.D.   On: 02/21/2016 21:41      Management plans discussed with the patient and she is in agreement. Discussed plan with husband Stable for discharge home with hospice  Patient should follow up with PCP in 1 week  CODE STATUS:     Code Status Orders        Start     Ordered   02/22/16 0232  Full code   Continuous     02/22/16 0231    Code Status History    Date Active Date Inactive Code Status Order ID Comments User Context   12/31/2015  5:11 PM 01/06/2016  8:08 PM Full Code [id number]  Enedina Finner, MD Inpatient   07/17/2015  5:28 PM 07/20/2015  5:46 PM Full Code [id number]  Katharina Caper, MD Inpatient   06/17/2015  5:27 PM 06/19/2015  5:50 PM Full Code [id number]  Tarry Kos, MD Inpatient   04/06/2015 11:36 AM 04/14/2015  7:29 PM Full Code [id number]  Delfino Lovett, MD Inpatient   03/16/2015 11:41 PM 03/20/2015  5:03 PM Full Code [id number]  Ramonita Lab, MD Inpatient      TOTAL TIME TAKING CARE OF THIS PATIENT: 35 minutes.    Note: This dictation was prepared with Dragon dictation along with smaller phrase technology. Any transcriptional errors that result from this process are unintentional.  Akua Blethen M.D on 02/23/2016 at 10:47 AM  Between 7am to 6pm - Pager - [phone] After 6pm go to [email or web address] - password [email or web address]  [facility] [place]  Office  [phone]  CC: Primary care physician;  Mickey Farber, MD

## 2016-02-23 NOTE — Progress Notes (Signed)
Inpatient Diabetes Program Recommendations  AACE/ADA: New Consensus Statement on Inpatient Glycemic Control (2015)  Target Ranges:  Prepandial:   less than 140 mg/dL      Peak postprandial:   less than 180 mg/dL (1-2 hours)      Critically ill patients:  140 - 180 mg/dL   Lab Results  Component Value Date   GLUCAP 204* 02/23/2016   HGBA1C 6.1 12/08/2015    Review of Glycemic Control  Results for Allison Shepard, Allison Shepard (MRN [id number]) as of 02/23/2016 09:26  Ref. Range 02/22/2016 02:32 02/22/2016 16:48 02/22/2016 20:41 02/22/2016 20:43 02/23/2016 07:22  Glucose-Capillary Latest Ref Range: 65-99 mg/dL 295174 (H) 284188 (H) 132254 (H) 268 (H) 204 (H)    Diabetes history: Type 2 Outpatient Diabetes medications: Lantus 30 units qhs, Novolog 8-12 units tid with meals Current orders for Inpatient glycemic control: Lantus 30 units qhs, Novolog moderate correction 0-15 units tid, Novolog 0-5 units qhs  Inpatient Diabetes Program Recommendations: Consider adding Novolog mealtime insulin 5 units tid with meals- continue Novolog moderate correction as ordered.   Allison RacerJulie Melany Wiesman, RN, BA, MHA, CDE Diabetes Coordinator Inpatient Diabetes Program  [phone] (Team Pager) [phone] [facility] Office) 02/23/2016 9:29 AM

## 2016-02-23 NOTE — Care Management Note (Signed)
Case Management Note  Patient Details  Name: Allison Shepard MRN: [id number] Date of Birth: 1934/06/11  Subjective/Objective:        Spoke with patient and husband at the bedside. [name] is alert and oriented . Husband and patient stated that she was set up with hospice [facility] and that was done at [facility] at discharge when she was there. [name] has all necessary equipment at home including WC, Hospital bed, Ramp , BSC , Shower chair, Home O2 and portable. [name] stated that Hospice was discontinued due to patient wanting PT but her and husband now agree that this is not something they want anymore. Patient wants to have Hospice services setup again with [facility]. Husband stated that he prefers to take patient home and not utilize EMS. Patient is able to transfer to car. Contacted Lauren [phone] at [facility] and faxed patient information to [phone].              Action/Plan: Home with HH/Hospice services.   Expected Discharge Date:                  Expected Discharge Plan:  Home w Hospice Care  In-House Referral:     Discharge planning Services  CM Consult  Post Acute Care Choice:    Choice offered to:  NA  DME Arranged:    DME Agency:     HH Arranged:    HH Agency:  [facility]  Status of Service:  Completed, signed off  If discussed at Long Length of Stay Meetings, dates discussed:    Additional Comments:  Adonis HugueninBerkhead, Kayliana Codd L, RN 02/23/2016, 11:15 AM

## 2016-02-26 LAB — BLOOD GAS, VENOUS
Acid-Base Excess: 3 mmol/L (ref 0.0–3.0)
Bicarbonate: 28.9 mEq/L — ABNORMAL HIGH (ref 21.0–28.0)
Delivery systems: POSITIVE
FIO2: 0.35
PATIENT TEMPERATURE: 37
PH VEN: 7.37 (ref 7.320–7.430)
pCO2, Ven: 50 mmHg (ref 44.0–60.0)

## 2016-02-29 ENCOUNTER — Telehealth: Payer: Self-pay | Admitting: Pulmonary Disease

## 2016-02-29 NOTE — Telephone Encounter (Signed)
Nurse with Hospice calling regarding orders that were faxed on Friday 7/14. Please call.

## 2016-03-01 ENCOUNTER — Ambulatory Visit: Payer: Medicare Other | Admitting: Family

## 2016-03-01 NOTE — Telephone Encounter (Signed)
Spoke with Amil AmenJulia and informed her that DS had been on vacation and I would fax them as soon as he signs them. Amil AmenJulia is fine with this. Nothing further needed.

## 2016-03-03 ENCOUNTER — Emergency Department: Payer: Medicare Other

## 2016-03-03 ENCOUNTER — Emergency Department
Admission: EM | Admit: 2016-03-03 | Discharge: 2016-03-03 | Disposition: A | Discharge status: Home / Self Care | Payer: Medicare Other | Attending: Emergency Medicine | Admitting: Emergency Medicine

## 2016-03-03 DIAGNOSIS — Z79899 Other long term (current) drug therapy: Secondary | ICD-10-CM | POA: Insufficient documentation

## 2016-03-03 DIAGNOSIS — W0110XA Fall on same level from slipping, tripping and stumbling with subsequent striking against unspecified object, initial encounter: Secondary | ICD-10-CM | POA: Insufficient documentation

## 2016-03-03 DIAGNOSIS — Y999 Unspecified external cause status: Secondary | ICD-10-CM | POA: Diagnosis not present

## 2016-03-03 DIAGNOSIS — J441 Chronic obstructive pulmonary disease with (acute) exacerbation: Secondary | ICD-10-CM | POA: Insufficient documentation

## 2016-03-03 DIAGNOSIS — S0990XA Unspecified injury of head, initial encounter: Secondary | ICD-10-CM

## 2016-03-03 DIAGNOSIS — Z7982 Long term (current) use of aspirin: Secondary | ICD-10-CM | POA: Insufficient documentation

## 2016-03-03 DIAGNOSIS — Z87891 Personal history of nicotine dependence: Secondary | ICD-10-CM | POA: Insufficient documentation

## 2016-03-03 DIAGNOSIS — Y9389 Activity, other specified: Secondary | ICD-10-CM | POA: Diagnosis not present

## 2016-03-03 DIAGNOSIS — Z794 Long term (current) use of insulin: Secondary | ICD-10-CM | POA: Insufficient documentation

## 2016-03-03 DIAGNOSIS — N189 Chronic kidney disease, unspecified: Secondary | ICD-10-CM | POA: Insufficient documentation

## 2016-03-03 DIAGNOSIS — S0083XA Contusion of other part of head, initial encounter: Secondary | ICD-10-CM | POA: Diagnosis not present

## 2016-03-03 DIAGNOSIS — I480 Paroxysmal atrial fibrillation: Secondary | ICD-10-CM | POA: Diagnosis not present

## 2016-03-03 DIAGNOSIS — E1122 Type 2 diabetes mellitus with diabetic chronic kidney disease: Secondary | ICD-10-CM | POA: Insufficient documentation

## 2016-03-03 DIAGNOSIS — Y929 Unspecified place or not applicable: Secondary | ICD-10-CM | POA: Diagnosis not present

## 2016-03-03 DIAGNOSIS — J45909 Unspecified asthma, uncomplicated: Secondary | ICD-10-CM | POA: Insufficient documentation

## 2016-03-03 DIAGNOSIS — I509 Heart failure, unspecified: Secondary | ICD-10-CM | POA: Diagnosis not present

## 2016-03-03 DIAGNOSIS — I130 Hypertensive heart and chronic kidney disease with heart failure and stage 1 through stage 4 chronic kidney disease, or unspecified chronic kidney disease: Secondary | ICD-10-CM | POA: Insufficient documentation

## 2016-03-03 DIAGNOSIS — M1990 Unspecified osteoarthritis, unspecified site: Secondary | ICD-10-CM | POA: Insufficient documentation

## 2016-03-03 MED ORDER — BACITRACIN ZINC 500 UNIT/GM EX OINT
TOPICAL_OINTMENT | CUTANEOUS | Status: AC
  Administered 2016-03-03: 08:00:00
  Filled 2016-03-03: qty 0.9

## 2016-03-03 NOTE — Discharge Instructions (Signed)
Head Injury, Adult °You have a head injury. Headaches and throwing up (vomiting) are common after a head injury. It should be easy to wake up from sleeping. Sometimes you must stay in the hospital. Most problems happen within the first 24 hours. Side effects may occur up to 7-10 days after the injury.  °WHAT ARE THE TYPES OF HEAD INJURIES? °Head injuries can be as minor as a bump. Some head injuries can be more severe. More severe head injuries include: °· A jarring injury to the brain (concussion). °· A bruise of the brain (contusion). This mean there is bleeding in the brain that can cause swelling. °· A cracked skull (skull fracture). °· Bleeding in the brain that collects, clots, and forms a bump (hematoma). °WHEN SHOULD I GET HELP RIGHT AWAY?  °· You are confused or sleepy. °· You cannot be woken up. °· You feel sick to your stomach (nauseous) or keep throwing up (vomiting). °· Your dizziness or unsteadiness is getting worse. °· You have very bad, lasting headaches that are not helped by medicine. Take medicines only as told by your doctor. °· You cannot use your arms or legs like normal. °· You cannot walk. °· You notice changes in the black spots in the center of the colored part of your eye (pupil). °· You have clear or bloody fluid coming from your nose or ears. °· You have trouble seeing. °During the next 24 hours after the injury, you must stay with someone who can watch you. This person should get help right away (call 911 in the [place] if you start to shake and are not able to control it (have seizures), you pass out, or you are unable to wake up. °HOW CAN I PREVENT A HEAD INJURY IN THE FUTURE? °· Wear seat belts. °· Wear a helmet while bike riding and playing sports like football. °· Stay away from dangerous activities around the house. °WHEN CAN I RETURN TO NORMAL ACTIVITIES AND ATHLETICS? °See your doctor before doing these activities. You should not do normal activities or play contact sports until 1  week after the following symptoms have stopped: °· Headache that does not go away. °· Dizziness. °· Poor attention. °· Confusion. °· Memory problems. °· Sickness to your stomach or throwing up. °· Tiredness. °· Fussiness. °· Bothered by bright lights or loud noises. °· Anxiousness or depression. °· Restless sleep. °MAKE SURE YOU:  °· Understand these instructions. °· Will watch your condition. °· Will get help right away if you are not doing well or get worse. °  °This information is not intended to replace advice given to you by your health care provider. Make sure you discuss any questions you have with your health care provider. °  °Document Released: 07/14/2008 Document Revised: 08/22/2014 Document Reviewed: 04/08/2013 °Elsevier Interactive Patient Education ©2016 [employer] ° °Please return immediately if condition worsens. Please contact her primary physician or the physician you were given for referral. If you have any specialist physicians involved in her treatment and plan please also contact them. Thank you for using [facility]. ° °

## 2016-03-03 NOTE — ED Provider Notes (Signed)
Time Seen: Approximately 0730  I have reviewed the triage notes  Chief Complaint: Fall   History of Present Illness: Allison Shepard is a 82 y.o. female who presents with status post fall with a head injury. Patient states that this morning she was sitting on the edge of the bed and slipped and fell. She struck her head and hasn't had patient/skin tear to her right elbow region. She denies any syncope or loss of consciousness after a fall. She denies any nausea vomiting and neck or lumbar spine discomfort. Patient has history of numerous medical problems including COPD chronic atrial fibrillation etc. She is not on any aggressive anticoagulation at this time.   Past Medical History  Diagnosis Date  . COPD (chronic obstructive pulmonary disease) (HCC)   . Atrial fibrillation (HCC)   . Hypertension   . Hypercholesteremia   . Pneumonia   . Diabetes mellitus without complication (HCC)     type 2  . Headache     migraines - years ago  . Shortness of breath dyspnea   . Asthma   . Dysrhythmia     afib  . Chronic kidney disease     history of insufficiency  . Arthritis   . Spinal disorder     stenosis  . Complication of anesthesia     difficulty to wake up after an 8 hour back surgery  . Edema     legs/feet  . Wheezing   . CHF (congestive heart failure) [facility])     Patient Active Problem List   Diagnosis Date Noted  . Dyspnea 02/22/2016  . COPD with exacerbation (HCC)   . CHF (congestive heart failure) (HCC) 12/31/2015  . Acute respiratory failure with hypoxia (HCC) 07/17/2015  . Left upper lobe pneumonia 07/17/2015  . Hemoptysis 07/17/2015  . Atrial fibrillation with RVR (HCC) 07/17/2015  . Hypotension 07/17/2015  . Renal insufficiency 07/17/2015  . Obesity 07/17/2015  . Metabolic encephalopathy 07/17/2015  . Fracture of radius, distal, with ulna, left, closed 06/17/2015  . S/P ORIF (open reduction internal fixation) fracture 06/17/2015  . Diaphragmatic hernia  04/07/2015  . Restrictive lung disease secondary to obesity 04/07/2015  . Pneumonia 04/07/2015  . COPD exacerbation (HCC) 04/07/2015  . Other emphysema (HCC) 04/07/2015  . Morbid obesity (HCC) 04/07/2015  . Debility 04/07/2015  . Sepsis (HCC) 04/06/2015  . Chronic a-fib (HCC) 03/20/2015  . Acute respiratory distress (HCC) 03/16/2015    Past Surgical History  Procedure Laterality Date  . Abdominal hysterectomy    . Shoulder fusion surgery Right   . Total knee arthroplasty Right     knee replacement  . Total hip arthroplasty Right     hip replacement  . Back surgery    . Cataract extraction      cataract surgery ? eye  . Fracture surgery Right     right arm 20 years ago  . Colonoscopy      x 3  . Orif distal radius fracture Left 06/17/2015  . Open reduction internal fixation (orif) distal radial fracture Left 06/17/2015    Procedure: OPEN REDUCTION INTERNAL FIXATION LEFT  DISTAL RADIUS AND ULNA FRACTURES;  Surgeon: Tarry Kos, MD;  Location: [facility] OR;  Service: Orthopedics;  Laterality: Left;  . Joint replacement    . Hernia repair    . Cataract extraction w/phaco Left 07/28/2015    Procedure: CATARACT EXTRACTION PHACO AND INTRAOCULAR LENS PLACEMENT (IOC);  Surgeon: Galen Manila, MD;  Location: [facility] ORS;  Service: Ophthalmology;  Laterality: Left;  [place] 2:16   . Esophagogastroduodenoscopy (egd) with propofol N/A 12/24/2015    Procedure: ESOPHAGOGASTRODUODENOSCOPY (EGD) WITH PROPOFOL;  Surgeon: [name], MD;  Location: [facility];  Service: Endoscopy;  Laterality: N/A;    Past Surgical History  Procedure Laterality Date  . Abdominal hysterectomy    . Shoulder fusion surgery Right   . Total knee arthroplasty Right     knee replacement  . Total hip arthroplasty Right     hip replacement  . Back surgery    . Cataract extraction      cataract surgery ? eye  . Fracture surgery Right     right arm 20 years ago  . Colonoscopy      x 3  . Orif distal radius fracture  Left 06/17/2015  . Open reduction internal fixation (orif) distal radial fracture Left 06/17/2015    Procedure: OPEN REDUCTION INTERNAL FIXATION LEFT  DISTAL RADIUS AND ULNA FRACTURES;  Surgeon: Tarry Kos, MD;  Location: [facility] OR;  Service: Orthopedics;  Laterality: Left;  . Joint replacement    . Hernia repair    . Cataract extraction w/phaco Left 07/28/2015    Procedure: CATARACT EXTRACTION PHACO AND INTRAOCULAR LENS PLACEMENT (IOC);  Surgeon: Galen Manila, MD;  Location: [facility] ORS;  Service: Ophthalmology;  Laterality: Left;  [place] 2:16   . Esophagogastroduodenoscopy (egd) with propofol N/A 12/24/2015    Procedure: ESOPHAGOGASTRODUODENOSCOPY (EGD) WITH PROPOFOL;  Surgeon: [name], MD;  Location: [facility];  Service: Endoscopy;  Laterality: N/A;    Current Outpatient Rx  Name  Route  Sig  Dispense  Refill  . albuterol (PROVENTIL HFA;VENTOLIN HFA) 108 (90 BASE) MCG/ACT inhaler   Inhalation   Inhale 2 puffs into the lungs every 6 (six) hours as needed for wheezing or shortness of breath.         Marland Kitchen albuterol (PROVENTIL) (2.5 MG/3ML) 0.083% nebulizer solution   Nebulization   Take 2.5 mg by nebulization every 6 (six) hours as needed for wheezing or shortness of breath.         Marland Kitchen aspirin EC 81 MG tablet   Oral   Take 81 mg by mouth daily.         . calcium carbonate (TUMS EX) 750 MG chewable tablet   Oral   Chew 1 tablet by mouth daily.         . Cholecalciferol (VITAMIN D-3) 1000 UNITS CAPS   Oral   Take 1,000 Units by mouth daily.         Marland Kitchen diltiazem (CARDIZEM CD) 180 MG 24 hr capsule   Oral   Take 180 mg by mouth daily.          . Fluticasone-Salmeterol (ADVAIR DISKUS) 250-50 MCG/DOSE AEPB   Inhalation   Inhale 1 puff into the lungs 2 (two) times daily.   60 each   0   . furosemide (LASIX) 20 MG tablet   Oral   Take 2 tablets (40 mg total) by mouth daily.   30 tablet   1   . guaiFENesin (ROBITUSSIN) 100 MG/5ML SOLN   Oral   Take 5 mLs (100 mg  total) by mouth every 4 (four) hours as needed for cough or to loosen phlegm.   118 mL   0   . hydrocortisone valerate cream (WESTCORT) 0.2 %   Topical   Apply 1 application topically daily.         . insulin aspart (NOVOLOG) 100 UNIT/ML injection  Subcutaneous   Inject 8-12 Units into the skin 3 (three) times daily with meals. Below 200=8 units and every 50 units above she gets two extra units up to 12 units.         . insulin glargine (LANTUS) 100 UNIT/ML injection   Subcutaneous   Inject 0.3 mLs (30 Units total) into the skin every morning.   10 mL   3   . levofloxacin (LEVAQUIN) 250 MG tablet   Oral   Take 1 tablet (250 mg total) by mouth daily.   5 tablet   5   . levothyroxine (SYNTHROID, LEVOTHROID) 100 MCG tablet   Oral   Take 100 mcg by mouth daily before breakfast.      11   . metoprolol tartrate (LOPRESSOR) 25 MG tablet   Oral   Take 0.5 tablets (12.5 mg total) by mouth 2 (two) times daily.         Marland Kitchen omeprazole (PRILOSEC) 20 MG capsule   Oral   Take 20 mg by mouth daily.         Marland Kitchen senna-docusate (SENOKOT-S) 8.6-50 MG tablet   Oral   Take 1 tablet by mouth at bedtime as needed for mild constipation.         . simvastatin (ZOCOR) 10 MG tablet   Oral   Take 10 mg by mouth daily.         Marland Kitchen zolpidem (AMBIEN) 5 MG tablet   Oral   Take 5 mg by mouth at bedtime as needed for sleep. Reported on 09/08/2015           Allergies:  Hydrocodone-chlorpheniramine; Hydromorphone hcl; Ibuprofen; Mucinex; and Percocet  Family History: Family History  Problem Relation Age of Onset  . Heart disease Father     Social History: Social History  Substance Use Topics  . Smoking status: Former Smoker    Quit date: 06/15/1992  . Smokeless tobacco: Never Used  . Alcohol Use: 8.4 oz/week    7 Glasses of wine, 7 Shots of liquor, 0 Standard drinks or equivalent per week     Comment: daily     Review of Systems:   10 point review of systems was  performed and was otherwise negative:  Constitutional: No fever Eyes: No visual disturbances ENT: No sore throat, ear pain Cardiac: No chest pain Respiratory: No shortness of breath, wheezing, or stridor Abdomen: No abdominal pain, no vomiting, No diarrhea Endocrine: No weight loss, No night sweats Extremities: No peripheral edema, cyanosis Skin: No rashes, easy bruising Neurologic: No focal weakness, trouble with speech or swollowing Urologic: No dysuria, Hematuria, or urinary frequency Patient denies any hip discomfort, midline thoracic or lumbar spine pain.  Physical Exam:  ED Triage Vitals  Enc Vitals Group     BP 03/03/16 0659 106/77 mmHg     Pulse Rate 03/03/16 0659 82     Resp 03/03/16 0659 16     Temp --      Temp src --      SpO2 03/03/16 0659 96 %     Weight 03/03/16 0659 251 lb 14.4 oz (114.261 kg)     Height 03/03/16 0659 5\' 5"  (1.651 m)     Head Cir --      Peak Flow --      Pain Score 03/03/16 0700 4     Pain Loc --      Pain Edu? --      Excl. in GC? --     General:  Awake , Alert , and Oriented times 3; GCS 15 Head: Patient has a right frontal contusion without any step-off or crepitus noted. No facial instability. Eyes: Pupils equal , round, reactive to light. No papilledema Nose/Throat: No nasal drainage, patent upper airway without erythema or exudate.  Neck: Supple, Full range of motion, No anterior adenopathy or palpable thyroid masses Lungs: Clear to ascultation without wheezes , rhonchi, or rales Heart: Regular rate, irregular rhythm without murmurs , gallops , or rubs Abdomen: Soft, non tender without rebound, guarding , or rigidity; bowel sounds positive and symmetric in all 4 quadrants. No organomegaly .        Extremities: Close examination right elbow shows a approximately nickel sized skin tear. Otherwise, the patient has full range of motion with flexion, extension, supination, and pronation without difficulty. Neurologic: normal ambulation,  Motor symmetric without deficits, sensory intact Skin: warm, dry, no rashes    EKG ED ECG REPORT I, Jennye Moccasin, the attending physician, personally viewed and interpreted this ECG.  Date: 03/03/2016 EKG Time: 0704 Rate: 82 Rhythm: Atrial fibrillation with controlled rate QRS Axis: normal Intervals: normal ST/T Wave abnormalities: normal Conduction Disturbances: none Narrative Interpretation: unremarkable No acute ischemic changes   Radiology:   CT Head Wo Contrast (Final result) Result time: 03/03/16 07:47:06   Final result by Rad Results In Interface (03/03/16 07:47:06)   Narrative:   CLINICAL DATA: Post fall, now with laceration and hematoma above the right eye.  EXAM: CT HEAD WITHOUT CONTRAST  CT CERVICAL SPINE WITHOUT CONTRAST  TECHNIQUE: Multidetector CT imaging of the head and cervical spine was performed following the standard protocol without intravenous contrast. Multiplanar CT image reconstructions of the cervical spine were also generated.  COMPARISON: Head and C-spine CT - 06/09/2015; head CT - 12/30/2015  FINDINGS: CT HEAD FINDINGS  There is asymmetric soft tissue swelling about the right-side of the forehead (representative images 23 through 30, series 2). No radiopaque foreign body or displaced calvarial fracture.  Similar findings of atrophy with sulcal prominence. Scattered periventricular hypodensities compatible microvascular ischemic disease. No CT evidence of acute large territory infarct. No intraparenchymal or extra-axial mass or hemorrhage. Unchanged size a configuration of the ventricles and basilar cisterns. No midline shift. Intracranial atherosclerosis.  Limited visualization of the paranasal sinuses is normal. There is partial opacification of the bilateral mastoid air cells, right greater than left, unchanged since the 12/2015 examination though new compared to the 05/2015 exam. Debris is noted within the  right external auditory canal. Incidental note is made of hyperostosis frontalis post bilateral cataract surgery.  CT CERVICAL SPINE FINDINGS  C1 to the superior endplate of T4 is imaged.  Evaluation the mid in caudal aspects of the cervical spine is degraded secondary to quantum mottle artifact due to patient body habitus.  Normal alignment of the cervical spine. There is mild straightening of the expected cervical lordosis with mild kyphosis centered about the C5-C6 articulation, similar to the 05/2015 examination. No anterolisthesis or retrolisthesis. The bilateral facets are normally aligned. The dens is normally positioned between the lateral masses of C1. Normal atlantodental and atlantoaxial articulations.  No fracture or static subluxation of the cervical spine. Cervical vertebral body heights are preserved. Prevertebral soft tissues are normal.  Mild to moderate multilevel cervical spine DDD, worse at C5-C6 with disc space height loss, endplate irregularity and sclerosis.  Query trace left-sided pleural effusion.  Regional soft tissues appear normal. No bulky cervical lymphadenopathy on this noncontrast examination. Atherosclerotic plaque within the bilateral  carotid bulbs.  IMPRESSION: 1. Mild soft tissue swelling about the right side of the forehead without associated radiopaque foreign body, displaced calvarial fracture or acute intracranial process. 2. No fracture or static subluxation of the cervical spine. 3. Similar findings of atrophy and microvascular ischemic disease. 4. Partial opacification the bilateral mastoid air cells, right greater than left, nonspecific though could be seen in the setting of mastoiditis. Clinical correlation is advised. 5. Mild to moderate multilevel cervical spine DDD, worse at C5-C6   Electronically Signed By: Simonne Come M.D. On: 03/03/2016 07:47          CT Cervical Spine Wo Contrast (Final result) Result time:  03/03/16 07:47:06   Final result by Rad Results In Interface (03/03/16 07:47:06)   Narrative:   CLINICAL DATA: Post fall, now with laceration and hematoma above the right eye.  EXAM: CT HEAD WITHOUT CONTRAST  CT CERVICAL SPINE WITHOUT CONTRAST  TECHNIQUE: Multidetector CT imaging of the head and cervical spine was performed following the standard protocol without intravenous contrast. Multiplanar CT image reconstructions of the cervical spine were also generated.  COMPARISON: Head and C-spine CT - 06/09/2015; head CT - 12/30/2015  FINDINGS: CT HEAD FINDINGS  There is asymmetric soft tissue swelling about the right-side of the forehead (representative images 23 through 30, series 2). No radiopaque foreign body or displaced calvarial fracture.  Similar findings of atrophy with sulcal prominence. Scattered periventricular hypodensities compatible microvascular ischemic disease. No CT evidence of acute large territory infarct. No intraparenchymal or extra-axial mass or hemorrhage. Unchanged size a configuration of the ventricles and basilar cisterns. No midline shift. Intracranial atherosclerosis.  Limited visualization of the paranasal sinuses is normal. There is partial opacification of the bilateral mastoid air cells, right greater than left, unchanged since the 12/2015 examination though new compared to the 05/2015 exam. Debris is noted within the right external auditory canal. Incidental note is made of hyperostosis frontalis post bilateral cataract surgery.  CT CERVICAL SPINE FINDINGS  C1 to the superior endplate of T4 is imaged.  Evaluation the mid in caudal aspects of the cervical spine is degraded secondary to quantum mottle artifact due to patient body habitus.  Normal alignment of the cervical spine. There is mild straightening of the expected cervical lordosis with mild kyphosis centered about the C5-C6 articulation, similar to the 05/2015 examination.  No anterolisthesis or retrolisthesis. The bilateral facets are normally aligned. The dens is normally positioned between the lateral masses of C1. Normal atlantodental and atlantoaxial articulations.  No fracture or static subluxation of the cervical spine. Cervical vertebral body heights are preserved. Prevertebral soft tissues are normal.  Mild to moderate multilevel cervical spine DDD, worse at C5-C6 with disc space height loss, endplate irregularity and sclerosis.  Query trace left-sided pleural effusion.  Regional soft tissues appear normal. No bulky cervical lymphadenopathy on this noncontrast examination. Atherosclerotic plaque within the bilateral carotid bulbs.  IMPRESSION: 1. Mild soft tissue swelling about the right side of the forehead without associated radiopaque foreign body, displaced calvarial fracture or acute intracranial process. 2. No fracture or static subluxation of the cervical spine. 3. Similar findings of atrophy and microvascular ischemic disease. 4. Partial opacification the bilateral mastoid air cells, right greater than left, nonspecific though could be seen in the setting of mastoiditis. Clinical correlation is advised. 5. Mild to moderate multilevel cervical spine DDD, worse at C5-C6   Electronically Signed By: Simonne Come M.D. On: 03/03/2016 07:47       I personally reviewed the radiologic studies  ED Course: * Patient's stay here was uneventful and the patient is going to be discharged in the care of her husband and takes good care of her at home. I felt laboratory work was not necessary at this time. Patient's otherwise hemodynamically stable. Patient has a stable mental status and likely has a concussion with no evidence of subdural epidural hematoma, etc. Skin tear on the right elbow with what appears to be no underlying bony injury based on clinical exam. Patient's wound was cleaned and dressed by the nursing  staff.    Assessment: Acute closed head injury Skin tear right elbow   Final Clinical Impression:   Final diagnoses:  Head injury, initial encounter     Plan: * Outpatient Patient was advised to return immediately if condition worsens. Patient was advised to follow up with their primary care physician or other specialized physicians involved in their outpatient care. The patient and/or family member/power of attorney had laboratory results reviewed at the bedside. All questions and concerns were addressed and appropriate discharge instructions were distributed by the nursing staff.             Jennye MoccasinBrian S Quigley, MD 03/03/16 [phone]

## 2016-03-03 NOTE — ED Notes (Addendum)
Pt arrives to ED from home via [facility] d/t a fall resulting in injury. Pt presents with a laceration and hematoma above the RIGHT eye and a skin tear to the RIGHT elbow. Pt denies pain anywhere but her elbow and above the eye.  Pt denies any LOC before or after the fall, pt reports she "just slipped off the bed". Pt denies N/V. Pt is A&O per her normal baseline, per EMS. Of note, pt is on continuous home O2 at 3L via [place].

## 2016-03-03 NOTE — ED Notes (Signed)
Patient transported to CT 

## 2016-03-06 ENCOUNTER — Emergency Department: Payer: Medicare Other

## 2016-03-06 ENCOUNTER — Encounter: Payer: Self-pay | Admitting: Emergency Medicine

## 2016-03-06 ENCOUNTER — Inpatient Hospital Stay
Admission: EM | Admit: 2016-03-06 | Discharge: 2016-03-08 | DRG: 689 | Disposition: A | Discharge status: Home / Self Care | Payer: Medicare Other | Attending: Internal Medicine | Admitting: Internal Medicine

## 2016-03-06 DIAGNOSIS — Z9849 Cataract extraction status, unspecified eye: Secondary | ICD-10-CM

## 2016-03-06 DIAGNOSIS — E860 Dehydration: Secondary | ICD-10-CM | POA: Diagnosis present

## 2016-03-06 DIAGNOSIS — R451 Restlessness and agitation: Secondary | ICD-10-CM | POA: Diagnosis present

## 2016-03-06 DIAGNOSIS — R410 Disorientation, unspecified: Secondary | ICD-10-CM

## 2016-03-06 DIAGNOSIS — Z794 Long term (current) use of insulin: Secondary | ICD-10-CM

## 2016-03-06 DIAGNOSIS — Z9981 Dependence on supplemental oxygen: Secondary | ICD-10-CM

## 2016-03-06 DIAGNOSIS — F05 Delirium due to known physiological condition: Secondary | ICD-10-CM | POA: Diagnosis present

## 2016-03-06 DIAGNOSIS — Z66 Do not resuscitate: Secondary | ICD-10-CM | POA: Diagnosis present

## 2016-03-06 DIAGNOSIS — R809 Proteinuria, unspecified: Secondary | ICD-10-CM | POA: Diagnosis present

## 2016-03-06 DIAGNOSIS — M4806 Spinal stenosis, lumbar region: Secondary | ICD-10-CM | POA: Diagnosis present

## 2016-03-06 DIAGNOSIS — N390 Urinary tract infection, site not specified: Secondary | ICD-10-CM | POA: Diagnosis not present

## 2016-03-06 DIAGNOSIS — Z7951 Long term (current) use of inhaled steroids: Secondary | ICD-10-CM

## 2016-03-06 DIAGNOSIS — Z96641 Presence of right artificial hip joint: Secondary | ICD-10-CM | POA: Diagnosis present

## 2016-03-06 DIAGNOSIS — I130 Hypertensive heart and chronic kidney disease with heart failure and stage 1 through stage 4 chronic kidney disease, or unspecified chronic kidney disease: Secondary | ICD-10-CM | POA: Diagnosis present

## 2016-03-06 DIAGNOSIS — J449 Chronic obstructive pulmonary disease, unspecified: Secondary | ICD-10-CM

## 2016-03-06 DIAGNOSIS — M1990 Unspecified osteoarthritis, unspecified site: Secondary | ICD-10-CM | POA: Diagnosis present

## 2016-03-06 DIAGNOSIS — R0781 Pleurodynia: Secondary | ICD-10-CM

## 2016-03-06 DIAGNOSIS — S41111A Laceration without foreign body of right upper arm, initial encounter: Secondary | ICD-10-CM

## 2016-03-06 DIAGNOSIS — Z885 Allergy status to narcotic agent status: Secondary | ICD-10-CM

## 2016-03-06 DIAGNOSIS — Z79899 Other long term (current) drug therapy: Secondary | ICD-10-CM

## 2016-03-06 DIAGNOSIS — J441 Chronic obstructive pulmonary disease with (acute) exacerbation: Secondary | ICD-10-CM | POA: Diagnosis present

## 2016-03-06 DIAGNOSIS — W06XXXA Fall from bed, initial encounter: Secondary | ICD-10-CM | POA: Diagnosis present

## 2016-03-06 DIAGNOSIS — E7800 Pure hypercholesterolemia, unspecified: Secondary | ICD-10-CM | POA: Diagnosis present

## 2016-03-06 DIAGNOSIS — Z90710 Acquired absence of both cervix and uterus: Secondary | ICD-10-CM

## 2016-03-06 DIAGNOSIS — M8580 Other specified disorders of bone density and structure, unspecified site: Secondary | ICD-10-CM | POA: Diagnosis present

## 2016-03-06 DIAGNOSIS — R0600 Dyspnea, unspecified: Secondary | ICD-10-CM

## 2016-03-06 DIAGNOSIS — R5383 Other fatigue: Secondary | ICD-10-CM | POA: Diagnosis present

## 2016-03-06 DIAGNOSIS — S0083XA Contusion of other part of head, initial encounter: Secondary | ICD-10-CM

## 2016-03-06 DIAGNOSIS — I5021 Acute systolic (congestive) heart failure: Secondary | ICD-10-CM | POA: Diagnosis present

## 2016-03-06 DIAGNOSIS — R609 Edema, unspecified: Secondary | ICD-10-CM

## 2016-03-06 DIAGNOSIS — Z87891 Personal history of nicotine dependence: Secondary | ICD-10-CM

## 2016-03-06 DIAGNOSIS — G4733 Obstructive sleep apnea (adult) (pediatric): Secondary | ICD-10-CM | POA: Diagnosis present

## 2016-03-06 DIAGNOSIS — J9601 Acute respiratory failure with hypoxia: Secondary | ICD-10-CM | POA: Diagnosis present

## 2016-03-06 DIAGNOSIS — R296 Repeated falls: Secondary | ICD-10-CM

## 2016-03-06 DIAGNOSIS — W19XXXA Unspecified fall, initial encounter: Secondary | ICD-10-CM

## 2016-03-06 DIAGNOSIS — E1122 Type 2 diabetes mellitus with diabetic chronic kidney disease: Secondary | ICD-10-CM | POA: Diagnosis present

## 2016-03-06 DIAGNOSIS — E669 Obesity, unspecified: Secondary | ICD-10-CM | POA: Diagnosis present

## 2016-03-06 DIAGNOSIS — Z888 Allergy status to other drugs, medicaments and biological substances status: Secondary | ICD-10-CM

## 2016-03-06 DIAGNOSIS — Z96651 Presence of right artificial knee joint: Secondary | ICD-10-CM | POA: Diagnosis present

## 2016-03-06 DIAGNOSIS — Z7982 Long term (current) use of aspirin: Secondary | ICD-10-CM

## 2016-03-06 DIAGNOSIS — D631 Anemia in chronic kidney disease: Secondary | ICD-10-CM | POA: Diagnosis present

## 2016-03-06 DIAGNOSIS — B9620 Unspecified Escherichia coli [E. coli] as the cause of diseases classified elsewhere: Secondary | ICD-10-CM | POA: Diagnosis present

## 2016-03-06 DIAGNOSIS — Z8249 Family history of ischemic heart disease and other diseases of the circulatory system: Secondary | ICD-10-CM

## 2016-03-06 DIAGNOSIS — I482 Chronic atrial fibrillation: Secondary | ICD-10-CM | POA: Diagnosis present

## 2016-03-06 DIAGNOSIS — N183 Chronic kidney disease, stage 3 unspecified: Secondary | ICD-10-CM

## 2016-03-06 DIAGNOSIS — N184 Chronic kidney disease, stage 4 (severe): Secondary | ICD-10-CM | POA: Diagnosis present

## 2016-03-06 DIAGNOSIS — Z9889 Other specified postprocedural states: Secondary | ICD-10-CM

## 2016-03-06 LAB — BLOOD GAS, ARTERIAL
ACID-BASE EXCESS: 4.3 mmol/L — AB (ref 0.0–3.0)
Allens test (pass/fail): POSITIVE — AB
BICARBONATE: 29.7 meq/L — AB (ref 21.0–28.0)
FIO2: 36
O2 SAT: 94.9 %
PATIENT TEMPERATURE: 37
PH ART: 7.4 (ref 7.350–7.450)
pCO2 arterial: 48 mmHg (ref 32.0–48.0)
pO2, Arterial: 75 mmHg — ABNORMAL LOW (ref 83.0–108.0)

## 2016-03-06 LAB — BASIC METABOLIC PANEL
Anion gap: 9 (ref 5–15)
BUN: 32 mg/dL — AB (ref 6–20)
CALCIUM: 8.4 mg/dL — AB (ref 8.9–10.3)
CO2: 27 mmol/L (ref 22–32)
CREATININE: 2.14 mg/dL — AB (ref 0.44–1.00)
Chloride: 105 mmol/L (ref 101–111)
GFR calc non Af Amer: 21 mL/min — ABNORMAL LOW (ref >60)
GFR, EST AFRICAN AMERICAN: 24 mL/min — AB (ref >60)
Glucose, Bld: 109 mg/dL — ABNORMAL HIGH (ref 65–99)
Potassium: 4.2 mmol/L (ref 3.5–5.1)
SODIUM: 141 mmol/L (ref 135–145)

## 2016-03-06 LAB — URINALYSIS COMPLETE WITH MICROSCOPIC (ARMC ONLY)
BILIRUBIN URINE: NEGATIVE
Glucose, UA: NEGATIVE mg/dL
Ketones, ur: NEGATIVE mg/dL
Leukocytes, UA: NEGATIVE
Nitrite: POSITIVE — AB
PH: 5 (ref 5.0–8.0)
PROTEIN: 30 mg/dL — AB
Specific Gravity, Urine: 1.017 (ref 1.005–1.030)

## 2016-03-06 LAB — CBC WITH DIFFERENTIAL/PLATELET
BASOS ABS: 0 10*3/uL (ref 0–0.1)
Basophils Relative: 1 %
EOS PCT: 2 %
Eosinophils Absolute: 0.1 10*3/uL (ref 0–0.7)
HEMATOCRIT: 28.6 % — AB (ref 35.0–47.0)
HEMOGLOBIN: 8.9 g/dL — AB (ref 12.0–16.0)
Lymphocytes Relative: 10 %
Lymphs Abs: 0.6 10*3/uL — ABNORMAL LOW (ref 1.0–3.6)
MCH: 24.8 pg — AB (ref 26.0–34.0)
MCHC: 31.3 g/dL — ABNORMAL LOW (ref 32.0–36.0)
MCV: 79.3 fL — AB (ref 80.0–100.0)
Monocytes Absolute: 0.5 10*3/uL (ref 0.2–0.9)
Monocytes Relative: 9 %
NEUTROS PCT: 78 %
Neutro Abs: 4.7 10*3/uL (ref 1.4–6.5)
PLATELETS: 217 10*3/uL (ref 150–440)
RBC: 3.61 MIL/uL — AB (ref 3.80–5.20)
RDW: 21.2 % — ABNORMAL HIGH (ref 11.5–14.5)
WBC: 6 10*3/uL (ref 3.6–11.0)

## 2016-03-06 LAB — TROPONIN I: Troponin I: <0.03 ng/mL (ref <0.03)

## 2016-03-06 LAB — GLUCOSE, CAPILLARY
GLUCOSE-CAPILLARY: 127 mg/dL — AB (ref 65–99)
Glucose-Capillary: 79 mg/dL (ref 65–99)
Glucose-Capillary: 123 mg/dL — ABNORMAL HIGH (ref 65–99)

## 2016-03-06 MED ORDER — PANTOPRAZOLE SODIUM 40 MG PO TBEC
40.0000 mg | DELAYED_RELEASE_TABLET | Freq: Every day | ORAL | Status: DC
  Administered 2016-03-06 – 2016-03-08 (×3): 40 mg via ORAL
  Filled 2016-03-06 (×3): qty 1

## 2016-03-06 MED ORDER — BISACODYL 10 MG RE SUPP: 10.0000 mg | Freq: Every day | RECTAL | Status: DC | PRN

## 2016-03-06 MED ORDER — METFORMIN HCL ER 500 MG PO TB24: 500.0000 mg | ORAL_TABLET | Freq: Every day | ORAL | Status: DC

## 2016-03-06 MED ORDER — IPRATROPIUM-ALBUTEROL 0.5-2.5 (3) MG/3ML IN SOLN
3.0000 mL | Freq: Four times a day (QID) | RESPIRATORY_TRACT | Status: DC
  Administered 2016-03-06 – 2016-03-08 (×7): 3 mL via RESPIRATORY_TRACT
  Filled 2016-03-06 (×7): qty 3

## 2016-03-06 MED ORDER — ASPIRIN EC 81 MG PO TBEC
81.0000 mg | DELAYED_RELEASE_TABLET | Freq: Every day | ORAL | Status: DC
  Administered 2016-03-07 – 2016-03-08 (×2): 81 mg via ORAL
  Filled 2016-03-06 (×2): qty 1

## 2016-03-06 MED ORDER — FUROSEMIDE 40 MG PO TABS
40.0000 mg | ORAL_TABLET | ORAL | Status: DC
  Filled 2016-03-06: qty 1

## 2016-03-06 MED ORDER — INSULIN ASPART 100 UNIT/ML ~~LOC~~ SOLN
0.0000 [IU] | Freq: Three times a day (TID) | SUBCUTANEOUS | Status: DC
  Administered 2016-03-06 – 2016-03-08 (×2): 1 [IU] via SUBCUTANEOUS
  Filled 2016-03-06 (×2): qty 1

## 2016-03-06 MED ORDER — TRAMADOL HCL 50 MG PO TABS
50.0000 mg | ORAL_TABLET | Freq: Four times a day (QID) | ORAL | Status: DC | PRN
Start: 2016-03-06 — End: 2016-03-08
  Administered 2016-03-06: 50 mg via ORAL
  Filled 2016-03-06: qty 1

## 2016-03-06 MED ORDER — FLUTICASONE PROPIONATE 50 MCG/ACT NA SUSP
1.0000 | Freq: Every day | NASAL | Status: DC
  Administered 2016-03-07 – 2016-03-08 (×2): 1 via NASAL
  Filled 2016-03-06: qty 16

## 2016-03-06 MED ORDER — MOMETASONE FURO-FORMOTEROL FUM 200-5 MCG/ACT IN AERO
2.0000 | INHALATION_SPRAY | Freq: Two times a day (BID) | RESPIRATORY_TRACT | Status: DC
Start: 2016-03-06 — End: 2016-03-06

## 2016-03-06 MED ORDER — SODIUM CHLORIDE 0.45 % IV SOLN
INTRAVENOUS | Status: DC
  Administered 2016-03-06: 15:00:00 via INTRAVENOUS

## 2016-03-06 MED ORDER — ACETAMINOPHEN 325 MG PO TABS: 650.0000 mg | ORAL_TABLET | Freq: Four times a day (QID) | ORAL | Status: DC | PRN

## 2016-03-06 MED ORDER — ACETAMINOPHEN 650 MG RE SUPP: 650.0000 mg | Freq: Four times a day (QID) | RECTAL | Status: DC | PRN

## 2016-03-06 MED ORDER — INSULIN GLARGINE 100 UNIT/ML ~~LOC~~ SOLN
30.0000 [IU] | Freq: Every morning | SUBCUTANEOUS | Status: DC
Start: 2016-03-07 — End: 2016-03-08
  Administered 2016-03-07 – 2016-03-08 (×2): 30 [IU] via SUBCUTANEOUS
  Filled 2016-03-06 (×3): qty 0.3

## 2016-03-06 MED ORDER — DILTIAZEM HCL ER COATED BEADS 180 MG PO CP24
180.0000 mg | ORAL_CAPSULE | Freq: Every day | ORAL | Status: DC
  Administered 2016-03-07 – 2016-03-08 (×2): 180 mg via ORAL
  Filled 2016-03-06 (×3): qty 1

## 2016-03-06 MED ORDER — LEVOTHYROXINE SODIUM 25 MCG PO TABS: 125.0000 ug | ORAL_TABLET | Freq: Every day | ORAL | Status: DC

## 2016-03-06 MED ORDER — DEXTROSE 5 % IV SOLN
1.0000 g | INTRAVENOUS | Status: DC
  Administered 2016-03-07 – 2016-03-08 (×2): 1 g via INTRAVENOUS
  Filled 2016-03-06 (×4): qty 10

## 2016-03-06 MED ORDER — HEPARIN SODIUM (PORCINE) 5000 UNIT/ML IJ SOLN
5000.0000 [IU] | Freq: Three times a day (TID) | INTRAMUSCULAR | Status: DC
  Administered 2016-03-06 – 2016-03-08 (×6): 5000 [IU] via SUBCUTANEOUS
  Filled 2016-03-06 (×7): qty 1

## 2016-03-06 MED ORDER — ONDANSETRON HCL 4 MG PO TABS
4.0000 mg | ORAL_TABLET | Freq: Four times a day (QID) | ORAL | Status: DC | PRN
  Filled 2016-03-06: qty 1

## 2016-03-06 MED ORDER — SIMVASTATIN 20 MG PO TABS
10.0000 mg | ORAL_TABLET | Freq: Every day | ORAL | Status: DC
  Administered 2016-03-06: 10 mg via ORAL
  Filled 2016-03-06 (×2): qty 1

## 2016-03-06 MED ORDER — QUETIAPINE FUMARATE 25 MG PO TABS
25.0000 mg | ORAL_TABLET | Freq: Every day | ORAL | Status: DC
  Administered 2016-03-06: 25 mg via ORAL
  Filled 2016-03-06 (×2): qty 1

## 2016-03-06 MED ORDER — UMECLIDINIUM-VILANTEROL 62.5-25 MCG/INH IN AEPB
1.0000 | INHALATION_SPRAY | Freq: Every day | RESPIRATORY_TRACT | Status: DC
  Administered 2016-03-06 – 2016-03-08 (×3): 1 via RESPIRATORY_TRACT
  Filled 2016-03-06: qty 14

## 2016-03-06 MED ORDER — ALBUTEROL SULFATE (2.5 MG/3ML) 0.083% IN NEBU
3.0000 mL | INHALATION_SOLUTION | RESPIRATORY_TRACT | Status: DC | PRN
  Administered 2016-03-07: 3 mL via RESPIRATORY_TRACT
  Filled 2016-03-06: qty 3

## 2016-03-06 MED ORDER — ONDANSETRON HCL 4 MG/2ML IJ SOLN: 4.0000 mg | Freq: Four times a day (QID) | INTRAMUSCULAR | Status: DC | PRN

## 2016-03-06 MED ORDER — DEXTROSE 5 % IV SOLN
1.0000 g | Freq: Once | INTRAVENOUS | Status: AC
  Administered 2016-03-06: 1 g via INTRAVENOUS
  Filled 2016-03-06: qty 10

## 2016-03-06 MED ORDER — DOCUSATE SODIUM 100 MG PO CAPS
100.0000 mg | ORAL_CAPSULE | Freq: Two times a day (BID) | ORAL | Status: DC
  Administered 2016-03-06 – 2016-03-08 (×4): 100 mg via ORAL
  Filled 2016-03-06 (×5): qty 1

## 2016-03-06 MED ORDER — POTASSIUM CHLORIDE CRYS ER 20 MEQ PO TBCR
20.0000 meq | EXTENDED_RELEASE_TABLET | Freq: Every day | ORAL | Status: DC
  Administered 2016-03-07 – 2016-03-08 (×2): 20 meq via ORAL
  Filled 2016-03-06 (×2): qty 1

## 2016-03-06 MED ORDER — MORPHINE SULFATE (PF) 2 MG/ML IV SOLN: 2.0000 mg | INTRAVENOUS | Status: DC | PRN

## 2016-03-06 MED ORDER — BUDESONIDE 0.25 MG/2ML IN SUSP
0.2500 mg | Freq: Two times a day (BID) | RESPIRATORY_TRACT | Status: DC
  Administered 2016-03-06 – 2016-03-08 (×5): 0.25 mg via RESPIRATORY_TRACT
  Filled 2016-03-06 (×6): qty 2

## 2016-03-06 MED ORDER — METOPROLOL SUCCINATE ER 50 MG PO TB24
100.0000 mg | ORAL_TABLET | Freq: Every day | ORAL | Status: DC
  Administered 2016-03-06: 100 mg via ORAL
  Filled 2016-03-06: qty 2

## 2016-03-06 NOTE — ED Notes (Signed)
This RN, and Kathlene November, RN introduced selves to patient. Per patient family who is at bedside. Per patient's family pt slid off the bed approx 4 days ago, resulting in bruising and swelling to the R side of her face, per patient's family, all the bruising and dried blood noted to bridge of nose and to L side of face is from today's fall. Pt's family states that patient drowsiness and falling asleep intermittently in the middle of a conversation is "normal for her when she hasn't gotten enough sleep". Pt intermittently falls asleep but is able to be woken back up.

## 2016-03-06 NOTE — ED Notes (Signed)
Pt slid out of bed while talking to husband. C/o pain to left forehead. Bruising to right face and whole forehead. Pt fell 4 days ago as well. No LOC. Dried blood to nose. No visible laceration. Skin tear to right arm.

## 2016-03-06 NOTE — ED Triage Notes (Signed)
Pt states was sitting on side of bed talking to spouse when she slipped out of bed. Pt states she struck her right side. Pt complains of right rib pain, right elbow pain, abrasion noted to right elbow. Pt states she may have struck her face, laceration noted to right eyebrow. Pt states she fell two days ago striking face. Pt with dark purple bruising noted to face. Pt refuses adamantly to wear c collar. Pt denies loc.

## 2016-03-06 NOTE — ED Notes (Signed)
Pt refusing C collar

## 2016-03-06 NOTE — ED Notes (Signed)
Report given to Leigh, RN.

## 2016-03-06 NOTE — Progress Notes (Signed)
PHARMACIST - PHYSICIAN COMMUNICATION  CONCERNING:  METFORMIN SAFE ADMINISTRATION POLICY  RECOMMENDATION: Metformin has been placed on DISCONTINUE (rejected order) STATUS and should be reordered only after any of the conditions below are ruled out.   DESCRIPTION:  The Pharmacy Committee has adopted a policy that restricts the use of metformin in hospitalized patients until all the contraindications to administration have been ruled out. Specific contraindications are:  [x]   CrCl <32ml/min

## 2016-03-06 NOTE — Care Management Obs Status (Signed)
MEDICARE OBSERVATION STATUS NOTIFICATION   Patient Details  Name: Allison Shepard MRN: [id number] Date of Birth: May 30, 1934   Medicare Observation Status Notification Given:  Yes    Caren Macadam, RN 03/06/2016, 12:13 PM

## 2016-03-06 NOTE — ED Provider Notes (Signed)
[facility] Emergency Department Provider Note   ____________________________________________  Time seen: Approximately 7:10 AM I have reviewed the triage vital signs and the triage nursing note.  HISTORY  Chief Complaint Fall   Historian Patient  HPI Allison Shepard is a 82 y.o. female with a history of CHF and COPD diabetes and A. fib, who wears 3 L home oxygen at home, is here for evaluation after a fall this morning. Patient states that she slipped out of bed and struck her forehead and she is having pain at her forehead especially on the left side. She initially stated that she didn't hurt her neck, but says it feels a little sore.  Denies recent illnesses. Denies chest pain or shortness of breath or coughing. Denies recent fevers. Denies abdominal pain. Denies any extremity pain.  She lives at home with her husband.    Past Medical History:  Diagnosis Date  . Arthritis   . Asthma   . Atrial fibrillation (HCC)   . CHF (congestive heart failure) (HCC)   . Chronic kidney disease    history of insufficiency  . Complication of anesthesia    difficulty to wake up after an 8 hour back surgery  . COPD (chronic obstructive pulmonary disease) (HCC)   . Diabetes mellitus without complication (HCC)    type 2  . Dysrhythmia    afib  . Edema    legs/feet  . Headache    migraines - years ago  . Hypercholesteremia   . Hypertension   . Pneumonia   . Shortness of breath dyspnea   . Spinal disorder    stenosis  . Wheezing     Patient Active Problem List   Diagnosis Date Noted  . Dyspnea 02/22/2016  . COPD with exacerbation (HCC)   . CHF (congestive heart failure) (HCC) 12/31/2015  . Acute respiratory failure with hypoxia (HCC) 07/17/2015  . Left upper lobe pneumonia 07/17/2015  . Hemoptysis 07/17/2015  . Atrial fibrillation with RVR (HCC) 07/17/2015  . Hypotension 07/17/2015  . Renal insufficiency 07/17/2015  . Obesity 07/17/2015  .  Metabolic encephalopathy 07/17/2015  . Fracture of radius, distal, with ulna, left, closed 06/17/2015  . S/P ORIF (open reduction internal fixation) fracture 06/17/2015  . Diaphragmatic hernia 04/07/2015  . Restrictive lung disease secondary to obesity 04/07/2015  . Pneumonia 04/07/2015  . COPD exacerbation (HCC) 04/07/2015  . Other emphysema (HCC) 04/07/2015  . Morbid obesity (HCC) 04/07/2015  . Debility 04/07/2015  . Sepsis (HCC) 04/06/2015  . Chronic a-fib (HCC) 03/20/2015  . Acute respiratory distress (HCC) 03/16/2015    Past Surgical History:  Procedure Laterality Date  . ABDOMINAL HYSTERECTOMY    . BACK SURGERY    . CATARACT EXTRACTION     cataract surgery ? eye  . CATARACT EXTRACTION W/PHACO Left 07/28/2015   Procedure: CATARACT EXTRACTION PHACO AND INTRAOCULAR LENS PLACEMENT (IOC);  Surgeon: Galen Manila, MD;  Location: [facility] ORS;  Service: Ophthalmology;  Laterality: Left;  [place] 2:16   . COLONOSCOPY     x 3  . ESOPHAGOGASTRODUODENOSCOPY (EGD) WITH PROPOFOL N/A 12/24/2015   Procedure: ESOPHAGOGASTRODUODENOSCOPY (EGD) WITH PROPOFOL;  Surgeon: [name], MD;  Location: [facility];  Service: Endoscopy;  Laterality: N/A;  . FRACTURE SURGERY Right    right arm 20 years ago  . HERNIA REPAIR    . JOINT REPLACEMENT    . OPEN REDUCTION INTERNAL FIXATION (ORIF) DISTAL RADIAL FRACTURE Left 06/17/2015   Procedure: OPEN REDUCTION INTERNAL FIXATION LEFT  DISTAL  RADIUS AND ULNA FRACTURES;  Surgeon: Tarry Kos, MD;  Location: [facility] OR;  Service: Orthopedics;  Laterality: Left;  . ORIF DISTAL RADIUS FRACTURE Left 06/17/2015  . SHOULDER FUSION SURGERY Right   . TOTAL HIP ARTHROPLASTY Right    hip replacement  . TOTAL KNEE ARTHROPLASTY Right    knee replacement    Current Outpatient Rx  . Order #: [id number] Class: Historical Med  . Order #: [id number] Class: Historical Med  . Order #: [id number] Class: Historical Med  . Order #: [id number] Class: Historical Med  . Order #:  [id number] Class: Historical Med  . Order #: [id number] Class: Historical Med  . Order #: [id number] Class: No Print  . Order #: [id number] Class: No Print  . Order #: [id number] Class: Print  . Order #: [id number] Class: Historical Med  . Order #: [id number] Class: Historical Med  . Order #: [id number] Class: No Print  . Order #: [id number] Class: Normal  . Order #: [id number] Class: Historical Med  . Order #: [id number] Class: No Print  . Order #: [id number] Class: Historical Med  . Order #: [id number] Class: No Print  . Order #: [id number] Class: Historical Med  . Order #: [id number] Class: Historical Med  Albuterol, aspirin, Cardizem, Lasix, Advair, NovoLog, Lantus, Synthroid, Lopressor, Zocor, Prilosec, Ambien  Allergies Hydrocodone-chlorpheniramine; Hydromorphone hcl; Ibuprofen; Mucinex [guaifenesin er]; and Percocet [oxycodone-acetaminophen]  Family History  Problem Relation Age of Onset  . Heart disease Father     Social History Social History  Substance Use Topics  . Smoking status: Former Smoker    Quit date: 06/15/1992  . Smokeless tobacco: Never Used  . Alcohol use 8.4 oz/week    7 Glasses of wine, 7 Shots of liquor per week     Comment: daily    Review of Systems  Constitutional: Negative for fever. Eyes: Negative for visual changes. ENT: Negative for sore throat. Cardiovascular: Negative for chest pain. Respiratory: Negative for shortness of breath. Gastrointestinal: Negative for abdominal pain, vomiting and diarrhea. Genitourinary: Negative for dysuria. Musculoskeletal: Negative for back pain. Skin: Negative for rash. Neurological: Positive for headache.Reports poor sleep. 10 point Review of Systems otherwise negative ____________________________________________   PHYSICAL EXAM:  VITAL SIGNS: ED Triage Vitals  Enc Vitals Group     BP 03/06/16 0706 121/90     Pulse Rate 03/06/16 0706 78     Resp --      Temp 03/06/16 0706 97.5 F (36.4 C)     Temp Source 03/06/16 0706  Oral     SpO2 03/06/16 0706 94 %     Weight 03/06/16 0706 260 lb 2.3 oz (118 kg)     Height 03/06/16 0706 5\' 5"  (1.651 m)     Head Circumference --      Peak Flow --      Pain Score 03/06/16 0707 4     Pain Loc --      Pain Edu? --      Excl. in GC? --      Constitutional: Sleepy, falls asleep easily, but does wake up and once she is up she is Alert and oriented. Well appearing and in no distress.  HEENT   Head: Normocephalic.  There is a new ecchymosis across the bilateral forehead and down to the right cheek. Abrasions.      Eyes: Conjunctivae are normal. PERRL. Normal extraocular movements.      Ears:         Nose: No congestion/rhinnorhea.   Mouth/Throat: Mucous membranes are moist.   Neck: No stridor.  Due to obesity and short neck, really  unable to palpate well across the posterior cervical spine, but patient states that she does have some soreness. Cardiovascular/Chest: Irregularly irregular, normal rate.  No murmurs, rubs, or gallops. Respiratory: Normal respiratory effort without tachypnea nor retractions. Breath sounds are clear and equal bilaterally. No wheezes/rales/rhonchi. Gastrointestinal: Soft. No distention, no guarding, no rebound. Nontender.  Morbidly obese  Genitourinary/rectal:Deferred Musculoskeletal: Hips stable. Nontender with normal range of motion in all extremities. No joint effusions.  No lower extremity tenderness.  No edema. Neurologic:  Normal speech and language. No gross or focal neurologic deficits are appreciated. Skin:  Skin is warm, dry. Small skin tear right arm. Psychiatric: A little bit sleepy, but awakes to voice and is oriented. Patient exhibits appropriate insight and judgment.  ____________________________________________   EKG I, Governor Rooks, MD, the attending physician have personally viewed and interpreted all ECGs.  93 bpm. Atrial fibrillation. Narrow QRS. Normal axis. Nonspecific ST and  T-wave ____________________________________________  LABS (pertinent positives/negatives)  Labs Reviewed  BASIC METABOLIC PANEL - Abnormal; Notable for the following:       Result Value   Glucose, Bld 109 (*)    BUN 32 (*)    Creatinine, Ser 2.14 (*)    Calcium 8.4 (*)    GFR calc non Af Amer 21 (*)    GFR calc Af Amer 24 (*)    All other components within normal limits  URINALYSIS COMPLETEWITH MICROSCOPIC ([facility] ONLY) - Abnormal; Notable for the following:    Color, Urine YELLOW (*)    APPearance CLEAR (*)    Hgb urine dipstick 3+ (*)    Protein, ur 30 (*)    Nitrite POSITIVE (*)    Bacteria, UA MANY (*)    Squamous Epithelial / LPF 0-5 (*)    All other components within normal limits  BLOOD GAS, ARTERIAL - Abnormal; Notable for the following:    pO2, Arterial 75 (*)    Bicarbonate 29.7 (*)    Acid-Base Excess 4.3 (*)    Allens test (pass/fail) POSITIVE (*)    All other components within normal limits  CBC WITH DIFFERENTIAL/PLATELET - Abnormal; Notable for the following:    RBC 3.61 (*)    Hemoglobin 8.9 (*)    HCT 28.6 (*)    MCV 79.3 (*)    MCH 24.8 (*)    MCHC 31.3 (*)    RDW 21.2 (*)    Lymphs Abs 0.6 (*)    All other components within normal limits  URINE CULTURE  CULTURE, BLOOD (ROUTINE X 2)  CULTURE, BLOOD (ROUTINE X 2)  TROPONIN I    ____________________________________________  RADIOLOGY All Xrays were viewed by me. Imaging interpreted by Radiologist.  Chest one view and ribs right:  IMPRESSION: No acute findings.  No rib fracture. Stable cardiomegaly. Aortic atherosclerosis. Coarse interstitial lung markings, stable compared to multiple prior studies suggesting chronic low-grade CHF versus chronic interstitial lung disease. Electronically Signed   By: Bary Richard M.D.  CT head and C-spine noncontrast:  IMPRESSION: Head CT: Forehead scalp hematoma. No underlying skull fracture or acute intracranial finding. Atrophy and chronic small  vessel disease. Cervical spine CT: No acute or traumatic finding. Chronic degenerative changes as seen previously. Electronically Signed   By: Paulina Fusi M.D. __________________________________________  PROCEDURES  Procedure(s) performed: None  Critical Care performed: None  ____________________________________________   ED COURSE / ASSESSMENT AND PLAN  Pertinent labs & imaging results that were available during my care of the patient were reviewed by me and considered  in my medical decision making (see chart for details).   This patient came in after a fall this morning complaining of a headache. Additionally she does seem a little excessively sleepy, unclear what the cause might be, I'm concerned about potential urinary tract infection, electrolyte abnormality, head injury bleed versus concussion, or hypercarbia with her underlying respiratory issues.  This patient was refusing certain aspects of treatment, and although she is alert and oriented and in some ways gives reasonable exhalation, she is also drifting off to sleep and I'm a little concerned that this may be altered mental status.  Her husband and her daughter came to bedside and we reviewed findings - negative serious/emergency traumatic injuries.  Negative positive UTI.  Concerned that her frequent falls over the past 4 days, excessive sleepiness, indicates she is unsafe for being at home right now. I think the urinary tract infection may have been the tipping point.  She is going be treated with IV Rocephin after blood and urine cultures. She does not appear to be septic at this point in time. She is going to require hospitalization for management and treatment of her frequent falls, sleepiness/altered mental status and nitroglycerin positive urinary tract infection.     CONSULTATIONS:   Hospitalist for admission.   Patient / Family / Caregiver informed of clinical course, medical decision-making process, and  agree with plan.     ___________________________________________   FINAL CLINICAL IMPRESSION(S) / ED DIAGNOSES   Final diagnoses:  Skin tear of right upper arm without complication, initial encounter  Facial contusion, initial encounter  UTI (lower urinary tract infection)  Confusion  Multiple falls              Note: This dictation was prepared with Dragon dictation. Any transcriptional errors that result from this process are unintentional    Governor Rooks, MD 03/06/16 1026

## 2016-03-06 NOTE — ED Notes (Signed)
Patient transported to CT 

## 2016-03-06 NOTE — ED Notes (Signed)
RN at bedside to start IV and get blood. Pt reports does not want blood work because had it 2 days ago and does nto want CT. Dr Shaune Pollack notified and at bedside. For now pt agreeing to testing.

## 2016-03-06 NOTE — ED Notes (Signed)
Report to valerie, rn.  

## 2016-03-06 NOTE — H&P (Signed)
History and Physical    Allison Shepard ZOX:[id number] DOB: 03-16-1934 DOA: 03/06/2016  Referring physician: Dr. Shaune Pollack PCP: Mickey Farber, MD  Specialists: none  Chief Complaint: frequent falls with rib pain  HPI: Allison Shepard is a 82 y.o. female has a past medical history significant for Obesity, COPD, OSA and chronic a-fib now with frequent fall, lethargy, and rib pain. In ER, no rib fractures noted. Was intermittently lethargic with mild elevation of CO2. UA positive for UTI. Ambulating with difficulty. She is now admitted.  Review of Systems: The patient denies anorexia, fever, weight loss,, vision loss, decreased hearing, hoarseness,  syncope,  , hemoptysis, abdominal pain, melena, hematochezia, severe indigestion/heartburn, hematuria, incontinence, genital sores, muscle weakness, suspicious skin lesions, transient blindness,  depression, unusual weight change, abnormal bleeding, enlarged lymph nodes, angioedema, and breast masses.   Past Medical History:  Diagnosis Date  . Arthritis   . Asthma   . Atrial fibrillation (HCC)   . CHF (congestive heart failure) (HCC)   . Chronic kidney disease    history of insufficiency  . Complication of anesthesia    difficulty to wake up after an 8 hour back surgery  . COPD (chronic obstructive pulmonary disease) (HCC)   . Diabetes mellitus without complication (HCC)    type 2  . Dysrhythmia    afib  . Edema    legs/feet  . Headache    migraines - years ago  . Hypercholesteremia   . Hypertension   . Pneumonia   . Shortness of breath dyspnea   . Spinal disorder    stenosis  . Wheezing    Past Surgical History:  Procedure Laterality Date  . ABDOMINAL HYSTERECTOMY    . BACK SURGERY    . CATARACT EXTRACTION     cataract surgery ? eye  . CATARACT EXTRACTION W/PHACO Left 07/28/2015   Procedure: CATARACT EXTRACTION PHACO AND INTRAOCULAR LENS PLACEMENT (IOC);  Surgeon: Galen Manila, MD;  Location: [facility] ORS;  Service:  Ophthalmology;  Laterality: Left;  [place] 2:16   . COLONOSCOPY     x 3  . ESOPHAGOGASTRODUODENOSCOPY (EGD) WITH PROPOFOL N/A 12/24/2015   Procedure: ESOPHAGOGASTRODUODENOSCOPY (EGD) WITH PROPOFOL;  Surgeon: [name], MD;  Location: [facility];  Service: Endoscopy;  Laterality: N/A;  . FRACTURE SURGERY Right    right arm 20 years ago  . HERNIA REPAIR    . JOINT REPLACEMENT    . OPEN REDUCTION INTERNAL FIXATION (ORIF) DISTAL RADIAL FRACTURE Left 06/17/2015   Procedure: OPEN REDUCTION INTERNAL FIXATION LEFT  DISTAL RADIUS AND ULNA FRACTURES;  Surgeon: Tarry Kos, MD;  Location: [facility];  Service: Orthopedics;  Laterality: Left;  . ORIF DISTAL RADIUS FRACTURE Left 06/17/2015  . SHOULDER FUSION SURGERY Right   . TOTAL HIP ARTHROPLASTY Right    hip replacement  . TOTAL KNEE ARTHROPLASTY Right    knee replacement   Social History:  reports that she quit smoking about 23 years ago. She has never used smokeless tobacco. She reports that she drinks about 8.4 oz of alcohol per week . She reports that she does not use drugs.  Allergies  Allergen Reactions  . Hydrocodone-Chlorpheniramine Other (See Comments)    Confusion  . Hydromorphone Hcl Nausea And Vomiting  . Ibuprofen Other (See Comments)    Pt was told by her MD not to take this medication.    . Mucinex [Guaifenesin Er] Other (See Comments)    Red all over. Felt like i was on fire  .  Percocet [Oxycodone-Acetaminophen] Hives    Family History  Problem Relation Age of Onset  . Heart disease Father     Prior to Admission medications   Medication Sig Start Date End Date Taking? Authorizing Provider  albuterol (PROVENTIL HFA;VENTOLIN HFA) 108 (90 BASE) MCG/ACT inhaler Inhale 2 puffs into the lungs every 4 (four) hours as needed for wheezing or shortness of breath.    Yes Historical Provider, MD  aspirin EC 81 MG tablet Take 81 mg by mouth daily.   Yes Historical Provider, MD  calcium carbonate (TUMS EX) 750 MG chewable tablet Chew 1  tablet by mouth 2 (two) times daily with a meal.    Yes Historical Provider, MD  Cholecalciferol (VITAMIN D-3) 1000 UNITS CAPS Take 2,000 Units by mouth daily.    Yes Historical Provider, MD  [employer] STOOL SOFTENER 100 MG capsule Take 100 mg by mouth at bedtime. 02/04/16  Yes Historical Provider, MD  diltiazem (CARDIZEM CD) 180 MG 24 hr capsule Take 180 mg by mouth daily.  09/10/15  Yes Historical Provider, MD  fluticasone (FLONASE) 50 MCG/ACT nasal spray Place 1 spray into both nostrils daily.   Yes Historical Provider, MD  furosemide (LASIX) 40 MG tablet Take 40 mg by mouth every morning.   Yes Historical Provider, MD  hydrocortisone valerate cream (WESTCORT) 0.2 % Apply 1 application topically 2 (two) times daily as needed. For rash.   Yes Historical Provider, MD  insulin aspart (NOVOLOG) 100 UNIT/ML injection Inject 8-12 Units into the skin 3 (three) times daily with meals. Below 200=8 units and every 50 units above she gets two extra units up to 12 units.   Yes Historical Provider, MD  insulin glargine (LANTUS) 100 UNIT/ML injection Inject 0.3 mLs (30 Units total) into the skin every morning. 01/06/16  Yes Shaune Pollack, MD  levothyroxine (SYNTHROID, LEVOTHROID) 125 MCG tablet Take 125 mcg by mouth daily before breakfast.   Yes Historical Provider, MD  metFORMIN (GLUCOPHAGE-XR) 500 MG 24 hr tablet Take 500 mg by mouth daily with supper. 02/26/16  Yes Historical Provider, MD  metoprolol succinate (TOPROL-XL) 100 MG 24 hr tablet Take 100 mg by mouth daily. Take with or immediately following a meal.   Yes Historical Provider, MD  omeprazole (PRILOSEC) 20 MG capsule Take 20 mg by mouth daily.   Yes Historical Provider, MD  potassium chloride SA (K-DUR,KLOR-CON) 20 MEQ tablet Take 20 mEq by mouth daily.   Yes Historical Provider, MD  simvastatin (ZOCOR) 10 MG tablet Take 10 mg by mouth at bedtime.    Yes Historical Provider, MD  traMADol (ULTRAM) 50 MG tablet Take 50 mg by mouth every 6 (six) hours as needed for  moderate pain or severe pain.   Yes Historical Provider, MD  umeclidinium-vilanterol (ANORO ELLIPTA) 62.5-25 MCG/INH AEPB Inhale 1 puff into the lungs daily.   Yes Historical Provider, MD  zolpidem (AMBIEN) 5 MG tablet Take 5 mg by mouth at bedtime as needed for sleep. Reported on 09/08/2015   Yes Historical Provider, MD  Fluticasone-Salmeterol (ADVAIR DISKUS) 250-50 MCG/DOSE AEPB Inhale 1 puff into the lungs 2 (two) times daily. 01/06/16   Shaune Pollack, MD  furosemide (LASIX) 20 MG tablet Take 2 tablets (40 mg total) by mouth daily. 02/23/16   Adrian Saran, MD  guaiFENesin (ROBITUSSIN) 100 MG/5ML SOLN Take 5 mLs (100 mg total) by mouth every 4 (four) hours as needed for cough or to loosen phlegm. 01/06/16   Shaune Pollack, MD  levofloxacin (LEVAQUIN) 250 MG tablet Take  1 tablet (250 mg total) by mouth daily. 02/23/16   Adrian Saran, MD  metoprolol tartrate (LOPRESSOR) 25 MG tablet Take 0.5 tablets (12.5 mg total) by mouth 2 (two) times daily. 01/06/16   Shaune Pollack, MD  senna-docusate (SENOKOT-S) 8.6-50 MG tablet Take 1 tablet by mouth at bedtime as needed for mild constipation. 01/06/16   Shaune Pollack, MD   Physical Exam: Vitals:   03/06/16 0706 03/06/16 0730 03/06/16 0830 03/06/16 1000  BP: 121/90 (!) 119/94 119/71 (!) 128/99  Pulse: 78 87 90 62  Resp:  13 19 (!) 26  Temp: 97.5 F (36.4 C)     TempSrc: Oral     SpO2: 94% 99% 95% 92%  Weight: 118 kg (260 lb 2.3 oz)     Height:  (1.651 m)        General:  No apparent distress, obese, diffuse bruising noted  Eyes: PERRL, EOMI, no scleral icterus, conjunctiva clear  ENT: moist oropharynx without exudate, TM's benign, dentition fair  Neck: supple, no lymphadenopathy. No bruits or thyromegaly  Cardiovascular: irregularly irregular without MRG; 2+ peripheral pulses, no JVD, 2+ peripheral edema  Respiratory: decreased breath sounds with basilar rales without wheezing, rhonchi.Respiratory effort normal  Abdomen: soft, non tender to palpation, positive  bowel sounds, no guarding, no rebound  Skin: no rashes or lesions. Diffuse bruising noted  Musculoskeletal: normal bulk and tone, no joint swelling  Psychiatric: normal mood and affect, A&OX3  Neurologic: CN 2-12 grossly intact, Motor strength 5/5 in all 4 groups with symmetric DTR's and non-focal sensory exam  Labs on Admission:  Basic Metabolic Panel:  Recent Labs Lab 03/06/16 0729  NA 141  K 4.2  CL 105  CO2 27  GLUCOSE 109*  BUN 32*  CREATININE 2.14*  CALCIUM 8.4*   Liver Function Tests: No results for input(s): AST, ALT, ALKPHOS, BILITOT, PROT, ALBUMIN in the last 168 hours. No results for input(s): LIPASE, AMYLASE in the last 168 hours. No results for input(s): AMMONIA in the last 168 hours. CBC:  Recent Labs Lab 03/06/16 0729  WBC 6.0  NEUTROABS 4.7  HGB 8.9*  HCT 28.6*  MCV 79.3*  PLT 217   Cardiac Enzymes:  Recent Labs Lab 03/06/16 0729  TROPONINI <0.03    BNP (last 3 results)  Recent Labs  12/31/15 1039 02/21/16 2121  BNP 1,094.0* 692.0*    ProBNP (last 3 results) No results for input(s): PROBNP in the last 8760 hours.  CBG: No results for input(s): GLUCAP in the last 168 hours.  Radiological Exams on Admission: Dg Chest 1 View  Result Date: 03/06/2016 CLINICAL DATA:  Fall onto right side, right anterior rib pain. EXAM: CHEST 1 VIEW COMPARISON:  Chest x-rays dated 02/21/2016 and 04/11/2015. FINDINGS: Cardiomegaly is stable. Atherosclerotic changes again noted at the aortic arch. Coarse interstitial lung markings appear stable. No new lung findings. No pleural effusion or pneumothorax seen. Osseous structures about the chest are unremarkable. Degenerative changes again noted at the left shoulder. No right-sided rib fracture or displacement seen. IMPRESSION: No acute findings.  No rib fracture. Stable cardiomegaly. Aortic atherosclerosis. Coarse interstitial lung markings, stable compared to multiple prior studies suggesting chronic low-grade  CHF versus chronic interstitial lung disease. Electronically Signed   By: Bary Richard M.D.   On: 03/06/2016 08:39  Dg Ribs Unilateral Right  Result Date: 03/06/2016 CLINICAL DATA:  Fall onto right side, right anterior rib pain. EXAM: CHEST 1 VIEW COMPARISON:  Chest x-rays dated 02/21/2016 and 04/11/2015. FINDINGS: Cardiomegaly is  stable. Atherosclerotic changes again noted at the aortic arch. Coarse interstitial lung markings appear stable. No new lung findings. No pleural effusion or pneumothorax seen. Osseous structures about the chest are unremarkable. Degenerative changes again noted at the left shoulder. No right-sided rib fracture or displacement seen. IMPRESSION: No acute findings.  No rib fracture. Stable cardiomegaly. Aortic atherosclerosis. Coarse interstitial lung markings, stable compared to multiple prior studies suggesting chronic low-grade CHF versus chronic interstitial lung disease. Electronically Signed   By: Bary Richard M.D.   On: 03/06/2016 08:39  Ct Head Wo Contrast  Result Date: 03/06/2016 CLINICAL DATA:  Larey Seat out of bit head several hours ago. Also fell 3 days ago with negative evaluation. EXAM: CT HEAD WITHOUT CONTRAST CT CERVICAL SPINE WITHOUT CONTRAST TECHNIQUE: Multidetector CT imaging of the head and cervical spine was performed following the standard protocol without intravenous contrast. Multiplanar CT image reconstructions of the cervical spine were also generated. COMPARISON:  03/03/2016 FINDINGS: CT HEAD FINDINGS Frontal scalp hematoma. No underlying skull fracture. The brain shows generalized atrophy with extensive chronic small-vessel ischemic changes throughout white matter. No sign of acute infarction, mass lesion, hydrocephalus or extra-axial collection. No fluid in the sinuses. Bilateral mastoid opacification and right middle ear opacification. CT CERVICAL SPINE FINDINGS No fracture or traumatic malalignment. Ordinary facet arthropathy in mid cervical spondylosis.  No soft tissue swelling. Lung apices are clear. Aortic atherosclerosis. IMPRESSION: Head CT: Forehead scalp hematoma. No underlying skull fracture or acute intracranial finding. Atrophy and chronic small vessel disease. Cervical spine CT: No acute or traumatic finding. Chronic degenerative changes as seen previously. Electronically Signed   By: Paulina Fusi M.D.   On: 03/06/2016 08:24  Ct Cervical Spine Wo Contrast  Result Date: 03/06/2016 CLINICAL DATA:  Larey Seat out of bit head several hours ago. Also fell 3 days ago with negative evaluation. EXAM: CT HEAD WITHOUT CONTRAST CT CERVICAL SPINE WITHOUT CONTRAST TECHNIQUE: Multidetector CT imaging of the head and cervical spine was performed following the standard protocol without intravenous contrast. Multiplanar CT image reconstructions of the cervical spine were also generated. COMPARISON:  03/03/2016 FINDINGS: CT HEAD FINDINGS Frontal scalp hematoma. No underlying skull fracture. The brain shows generalized atrophy with extensive chronic small-vessel ischemic changes throughout white matter. No sign of acute infarction, mass lesion, hydrocephalus or extra-axial collection. No fluid in the sinuses. Bilateral mastoid opacification and right middle ear opacification. CT CERVICAL SPINE FINDINGS No fracture or traumatic malalignment. Ordinary facet arthropathy in mid cervical spondylosis. No soft tissue swelling. Lung apices are clear. Aortic atherosclerosis. IMPRESSION: Head CT: Forehead scalp hematoma. No underlying skull fracture or acute intracranial finding. Atrophy and chronic small vessel disease. Cervical spine CT: No acute or traumatic finding. Chronic degenerative changes as seen previously. Electronically Signed   By: Paulina Fusi M.D.   On: 03/06/2016 08:24   EKG: Independently reviewed.  Assessment/Plan Principal Problem:   UTI (lower urinary tract infection) Active Problems:   Frequent falls   COPD (chronic obstructive pulmonary disease) (HCC)    CKD (chronic kidney disease) stage 3, GFR 30-59 ml/min   Will observe on floor with telemetry and begin IV fluids and IV ABX. Cultures sent. Consult Nephrology and Pulmonology. Begin CPAP qhs. Follow sugars. Repeat labs in AM.  Diet: carb modified Fluids: 1/2 NS@75  DVT Prophylaxis: SQ Heparin  Code Status: FULL  Family Communication: none  Disposition Plan: ALF  Time spent: 50 min

## 2016-03-06 NOTE — Discharge Instructions (Signed)
You were evaluated for head injury and found to have bruising to the face and forehead as well as skin tear to the right arm.  Given your multiple recent falls, make sure that you are using extra care in caution when you move without to prevent additional falls.  Return to emergency department for any confusion altered mental status, weakness or numbness, chest pain, or signs of infection to the skin tear including redness, or drainage or any new or worsening pain.

## 2016-03-06 NOTE — ED Provider Notes (Signed)
Medical screening examination/treatment/procedure(s) were performed by non-physician practitioner and as supervising physician I was immediately available for consultation/collaboration.     Governor Rooks, MD 03/06/16 [id number]

## 2016-03-07 ENCOUNTER — Observation Stay: Payer: Medicare Other

## 2016-03-07 DIAGNOSIS — Z96651 Presence of right artificial knee joint: Secondary | ICD-10-CM | POA: Diagnosis present

## 2016-03-07 DIAGNOSIS — E669 Obesity, unspecified: Secondary | ICD-10-CM | POA: Diagnosis present

## 2016-03-07 DIAGNOSIS — Z9849 Cataract extraction status, unspecified eye: Secondary | ICD-10-CM | POA: Diagnosis not present

## 2016-03-07 DIAGNOSIS — N390 Urinary tract infection, site not specified: Secondary | ICD-10-CM | POA: Diagnosis not present

## 2016-03-07 DIAGNOSIS — Z888 Allergy status to other drugs, medicaments and biological substances status: Secondary | ICD-10-CM | POA: Diagnosis not present

## 2016-03-07 DIAGNOSIS — M1990 Unspecified osteoarthritis, unspecified site: Secondary | ICD-10-CM | POA: Diagnosis present

## 2016-03-07 DIAGNOSIS — B9620 Unspecified Escherichia coli [E. coli] as the cause of diseases classified elsewhere: Secondary | ICD-10-CM | POA: Diagnosis present

## 2016-03-07 DIAGNOSIS — E7800 Pure hypercholesterolemia, unspecified: Secondary | ICD-10-CM | POA: Diagnosis present

## 2016-03-07 DIAGNOSIS — R410 Disorientation, unspecified: Secondary | ICD-10-CM | POA: Diagnosis present

## 2016-03-07 DIAGNOSIS — Z9889 Other specified postprocedural states: Secondary | ICD-10-CM | POA: Diagnosis not present

## 2016-03-07 DIAGNOSIS — Z90710 Acquired absence of both cervix and uterus: Secondary | ICD-10-CM | POA: Diagnosis not present

## 2016-03-07 DIAGNOSIS — G4733 Obstructive sleep apnea (adult) (pediatric): Secondary | ICD-10-CM | POA: Diagnosis present

## 2016-03-07 DIAGNOSIS — F05 Delirium due to known physiological condition: Secondary | ICD-10-CM | POA: Diagnosis present

## 2016-03-07 DIAGNOSIS — J9601 Acute respiratory failure with hypoxia: Secondary | ICD-10-CM | POA: Diagnosis present

## 2016-03-07 DIAGNOSIS — E860 Dehydration: Secondary | ICD-10-CM | POA: Diagnosis present

## 2016-03-07 DIAGNOSIS — J441 Chronic obstructive pulmonary disease with (acute) exacerbation: Secondary | ICD-10-CM | POA: Diagnosis present

## 2016-03-07 DIAGNOSIS — Z87891 Personal history of nicotine dependence: Secondary | ICD-10-CM | POA: Diagnosis not present

## 2016-03-07 DIAGNOSIS — I482 Chronic atrial fibrillation: Secondary | ICD-10-CM | POA: Diagnosis present

## 2016-03-07 DIAGNOSIS — E1122 Type 2 diabetes mellitus with diabetic chronic kidney disease: Secondary | ICD-10-CM | POA: Diagnosis present

## 2016-03-07 DIAGNOSIS — W06XXXA Fall from bed, initial encounter: Secondary | ICD-10-CM | POA: Diagnosis present

## 2016-03-07 DIAGNOSIS — I130 Hypertensive heart and chronic kidney disease with heart failure and stage 1 through stage 4 chronic kidney disease, or unspecified chronic kidney disease: Secondary | ICD-10-CM | POA: Diagnosis present

## 2016-03-07 DIAGNOSIS — Z9981 Dependence on supplemental oxygen: Secondary | ICD-10-CM | POA: Diagnosis not present

## 2016-03-07 DIAGNOSIS — N184 Chronic kidney disease, stage 4 (severe): Secondary | ICD-10-CM | POA: Diagnosis present

## 2016-03-07 DIAGNOSIS — I5021 Acute systolic (congestive) heart failure: Secondary | ICD-10-CM | POA: Diagnosis present

## 2016-03-07 DIAGNOSIS — Z885 Allergy status to narcotic agent status: Secondary | ICD-10-CM | POA: Diagnosis not present

## 2016-03-07 DIAGNOSIS — Z96641 Presence of right artificial hip joint: Secondary | ICD-10-CM | POA: Diagnosis present

## 2016-03-07 LAB — BLOOD GAS, ARTERIAL
ACID-BASE EXCESS: 5.2 mmol/L — AB (ref 0.0–3.0)
ALLENS TEST (PASS/FAIL): POSITIVE — AB
Bicarbonate: 29.9 mEq/L — ABNORMAL HIGH (ref 21.0–28.0)
DELIVERY SYSTEMS: POSITIVE
Expiratory PAP: 10
FIO2: 34
INSPIRATORY PAP: 10
O2 Saturation: 92.9 %
PATIENT TEMPERATURE: 37
pCO2 arterial: 44 mmHg (ref 32.0–48.0)
pH, Arterial: 7.44 (ref 7.350–7.450)
pO2, Arterial: 64 mmHg — ABNORMAL LOW (ref 83.0–108.0)

## 2016-03-07 LAB — COMPREHENSIVE METABOLIC PANEL
ALK PHOS: 118 U/L (ref 38–126)
ALT: 16 U/L (ref 14–54)
ANION GAP: 4 — AB (ref 5–15)
AST: 20 U/L (ref 15–41)
Albumin: 3.1 g/dL — ABNORMAL LOW (ref 3.5–5.0)
BUN: 27 mg/dL — AB (ref 6–20)
CALCIUM: 8.4 mg/dL — AB (ref 8.9–10.3)
CO2: 28 mmol/L (ref 22–32)
Chloride: 109 mmol/L (ref 101–111)
Creatinine, Ser: 1.82 mg/dL — ABNORMAL HIGH (ref 0.44–1.00)
GFR calc Af Amer: 29 mL/min — ABNORMAL LOW (ref >60)
GFR, EST NON AFRICAN AMERICAN: 25 mL/min — AB (ref >60)
Glucose, Bld: 108 mg/dL — ABNORMAL HIGH (ref 65–99)
POTASSIUM: 4.2 mmol/L (ref 3.5–5.1)
Sodium: 141 mmol/L (ref 135–145)
TOTAL PROTEIN: 6.1 g/dL — AB (ref 6.5–8.1)
Total Bilirubin: 0.9 mg/dL (ref 0.3–1.2)

## 2016-03-07 LAB — CBC
HEMATOCRIT: 25.7 % — AB (ref 35.0–47.0)
Hemoglobin: 7.9 g/dL — ABNORMAL LOW (ref 12.0–16.0)
MCH: 24.3 pg — ABNORMAL LOW (ref 26.0–34.0)
MCHC: 30.9 g/dL — AB (ref 32.0–36.0)
MCV: 78.7 fL — ABNORMAL LOW (ref 80.0–100.0)
Platelets: 206 10*3/uL (ref 150–440)
RBC: 3.27 MIL/uL — ABNORMAL LOW (ref 3.80–5.20)
RDW: 21.3 % — AB (ref 11.5–14.5)
WBC: 5.8 10*3/uL (ref 3.6–11.0)

## 2016-03-07 LAB — GLUCOSE, CAPILLARY
GLUCOSE-CAPILLARY: 105 mg/dL — AB (ref 65–99)
GLUCOSE-CAPILLARY: 108 mg/dL — AB (ref 65–99)
GLUCOSE-CAPILLARY: 98 mg/dL (ref 65–99)
GLUCOSE-CAPILLARY: 96 mg/dL (ref 65–99)
GLUCOSE-CAPILLARY: 105 mg/dL — AB (ref 65–99)
Glucose-Capillary: 111 mg/dL — ABNORMAL HIGH (ref 65–99)

## 2016-03-07 MED ORDER — LORAZEPAM 0.5 MG PO TABS
0.2500 mg | ORAL_TABLET | Freq: Once | ORAL | Status: AC
  Administered 2016-03-07: 0.25 mg via ORAL
  Filled 2016-03-07: qty 1

## 2016-03-07 MED ORDER — BENZONATATE 100 MG PO CAPS
100.0000 mg | ORAL_CAPSULE | Freq: Three times a day (TID) | ORAL | Status: DC | PRN
  Administered 2016-03-07: 100 mg via ORAL
  Filled 2016-03-07 (×2): qty 1

## 2016-03-07 MED ORDER — CETYLPYRIDINIUM CHLORIDE 0.05 % MT LIQD
7.0000 mL | Freq: Two times a day (BID) | OROMUCOSAL | Status: DC
  Administered 2016-03-07 – 2016-03-08 (×2): 7 mL via OROMUCOSAL

## 2016-03-07 MED ORDER — FUROSEMIDE 10 MG/ML IJ SOLN
40.0000 mg | Freq: Once | INTRAMUSCULAR | Status: AC
  Administered 2016-03-07: 40 mg via INTRAVENOUS
  Filled 2016-03-07: qty 4

## 2016-03-07 MED ORDER — NALOXONE HCL 0.4 MG/ML IJ SOLN
0.4000 mg | INTRAMUSCULAR | Status: AC
  Administered 2016-03-07: 0.4 mg via INTRAVENOUS
  Filled 2016-03-07: qty 1

## 2016-03-07 MED ORDER — HALOPERIDOL LACTATE 5 MG/ML IJ SOLN: 1.0000 mg | Freq: Once | INTRAMUSCULAR | Status: DC

## 2016-03-07 MED ORDER — HALOPERIDOL LACTATE 5 MG/ML IJ SOLN
INTRAMUSCULAR | Status: AC
  Administered 2016-03-07: 5 mg
  Filled 2016-03-07: qty 1

## 2016-03-07 MED ORDER — LORAZEPAM 2 MG/ML IJ SOLN: 0.2500 mg | Freq: Once | INTRAMUSCULAR | Status: DC

## 2016-03-07 MED ORDER — FUROSEMIDE 10 MG/ML IJ SOLN
20.0000 mg | INTRAMUSCULAR | Status: AC
  Administered 2016-03-07: 20 mg via INTRAVENOUS
  Filled 2016-03-07: qty 2

## 2016-03-07 NOTE — Progress Notes (Signed)
PT Cancellation Note  Patient Details Name: RAYAN ENGELKES MRN: [id number] DOB: [birth year]   Cancelled Treatment:    Reason Eval/Treat Not Completed: Patient not medically ready. Order received, chart reviewed. Per discussion with RN, pt is confused, agitated, and not following commands at this time. RN requesting PT hold evaluation until a later date/time when pt is more appropriate. Will follow-up as able.    Searcy Miyoshi, SPT 03/07/2016, 9:53 AM

## 2016-03-07 NOTE — Progress Notes (Signed)
Received call from MD and pharmacy stating that patient had previous EKG changes and that Haldol should be discontinued after giving. MD entered new order for Ativan which had previously caused pt to become unresponsive and required Narcan. Will not give ativan at this time since she just received Haldol and will monitor closely.

## 2016-03-07 NOTE — Progress Notes (Signed)
Dr. Madelon Lips notified of this mornings events. Orders for 1 ABG now and 1:1 sitter. Will continue to monitor.

## 2016-03-07 NOTE — Progress Notes (Signed)
Pt received a bath tonight and after the bath the pt began to complain of SOB and chest tightness. PRN breathing treatment x 1 given and effective. No further complaints of chest pain and MD made aware. Received orders for cough meds and anti anxiety meds x 1. Will give and continue to monitor. VSS.

## 2016-03-07 NOTE — Evaluation (Signed)
Clinical/Bedside Swallow Evaluation Patient Details  Name: Allison Shepard MRN: [id number] Date of Birth: 1934/07/16  Today's Date: 03/07/2016 Time: SLP Start Time (ACUTE ONLY): 1315 SLP Stop Time (ACUTE ONLY): 1415 SLP Time Calculation (min) (ACUTE ONLY): 60 min  Past Medical History:  Past Medical History:  Diagnosis Date  . Arthritis   . Asthma   . Atrial fibrillation (HCC)   . CHF (congestive heart failure) (HCC)   . Chronic kidney disease    history of insufficiency  . Complication of anesthesia    difficulty to wake up after an 8 hour back surgery  . COPD (chronic obstructive pulmonary disease) (HCC)   . Diabetes mellitus without complication (HCC)    type 2  . Dysrhythmia    afib  . Edema    legs/feet  . Headache    migraines - years ago  . Hypercholesteremia   . Hypertension   . Pneumonia   . Shortness of breath dyspnea   . Spinal disorder    stenosis  . Wheezing    Past Surgical History:  Past Surgical History:  Procedure Laterality Date  . ABDOMINAL HYSTERECTOMY    . BACK SURGERY    . CATARACT EXTRACTION     cataract surgery ? eye  . CATARACT EXTRACTION W/PHACO Left 07/28/2015   Procedure: CATARACT EXTRACTION PHACO AND INTRAOCULAR LENS PLACEMENT (IOC);  Surgeon: Galen Manila, MD;  Location: [facility] ORS;  Service: Ophthalmology;  Laterality: Left;  [place] 2:16   . COLONOSCOPY     x 3  . ESOPHAGOGASTRODUODENOSCOPY (EGD) WITH PROPOFOL N/A 12/24/2015   Procedure: ESOPHAGOGASTRODUODENOSCOPY (EGD) WITH PROPOFOL;  Surgeon: [name], MD;  Location: [facility];  Service: Endoscopy;  Laterality: N/A;  . FRACTURE SURGERY Right    right arm 20 years ago  . HERNIA REPAIR    . JOINT REPLACEMENT    . OPEN REDUCTION INTERNAL FIXATION (ORIF) DISTAL RADIAL FRACTURE Left 06/17/2015   Procedure: OPEN REDUCTION INTERNAL FIXATION LEFT  DISTAL RADIUS AND ULNA FRACTURES;  Surgeon: Tarry Kos, MD;  Location: [facility];  Service: Orthopedics;  Laterality: Left;  . ORIF  DISTAL RADIUS FRACTURE Left 06/17/2015  . SHOULDER FUSION SURGERY Right   . TOTAL HIP ARTHROPLASTY Right    hip replacement  . TOTAL KNEE ARTHROPLASTY Right    knee replacement   HPI:  Pt is a 82 y.o. female has a past medical history significant for Obesity, COPD, OSA and chronic a-fib now with frequent fall, lethargy, and rib pain. In ER, no rib fractures noted. Was intermittently lethargic with mild elevation of CO2. UA positive for UTI. Ambulating with difficulty w/ falls recently. Pt is followed by [facility] per Husband. Pt is more alert and appropriate today. She was more confused and agitated at admission. She continues to require verbal cues for follow through at times but is more appropriate and fed self during the evaluation w/ setup assist. NSG stated pt swallowed pills w/ water appropriately earlier. Husband stated pt was due to go to the dentist for dental issues but has been unable to go f/t medical status.    Assessment / Plan / Recommendation Clinical Impression  Pt appeared to adequately tolerate trials of thin liquids via cup/straw and trials of puree/softened solids w/ no immediate, overt s/s of aspiration; no decline in pulmonary status when taking her time and drinking slowly w/ small sips. However, pt c/o mild tooth/gum discomfort which Husband stated pt had been scheduled to go to the dentist for this  but was unable to do so d/t medical issues.  Pt exhibited no oral phase deficits w/ trials of liquids and purees; timely bolus management and A-P transfer was noted. W/ trials of increased texture x2, pt was able to fully masticate the increased textured foods sufficiently for swallowing when softened more. Suspect the difficulty w/ masticating solid foods more effectively and efficiently is d/t the dentition issue. Pt was given instruction on general aspiration precuations; education on choosing the right foods and adding more puree foods/soups into the diet if necessary as  well as drink supplements from the Dietician. Pt appears at reduced risk for aspiration from an oropharyngeal standpoint following general precautions but would benefit from a modified, Dysphagia 3 diet consistency w/ thin liquids for easier mastication. Recommend monitoring at meals d/t Cognitive status. NSG updated on eval results and poc. Husband and NSG agreed. Dietician contacted.    Aspiration Risk   (reduced following general aspiration precautions)    Diet Recommendation  Dysphagia 3 w/ thin liquids; moistened meats and cut well. General aspiration precautions. Tray setup and monitoring at meals d/t Cognitive status currently.  Medication Administration: Whole meds with puree (for easier swallowing if needed)    Other  Recommendations Recommended Consults:  (Dietician consult as needed) Oral Care Recommendations: Oral care BID;Staff/trained caregiver to provide oral care   Follow up Recommendations  None    Frequency and Duration  2x week x 1 week        Prognosis Prognosis for Safe Diet Advancement: Good Barriers to Reach Goals:  (current medical status; sore tooth per pt/husband)      Swallow Study   General Date of Onset: 03/06/16 HPI: Pt is a 82 y.o. female has a past medical history significant for Obesity, COPD, OSA and chronic a-fib now with frequent fall, lethargy, and rib pain. In ER, no rib fractures noted. Was intermittently lethargic with mild elevation of CO2. UA positive for UTI. Ambulating with difficulty w/ falls recently. Pt is followed by [facility] per Husband. Pt is more alert and appropriate today. She was more confused and agitated at admission. She continues to require verbal cues for follow through at times but is more appropriate and fed self during the evaluation w/ setup assist. NSG stated pt swallowed pills w/ water appropriately earlier. Husband stated pt was due to go to the dentist for dental issues but has been unable to go f/t medical status.   Type of Study: Bedside Swallow Evaluation Previous Swallow Assessment: none indicated Diet Prior to this Study: Regular;Thin liquids (c/o dentition issues during mastication ) Temperature Spikes Noted: No (wbc not elevated) Respiratory Status: Nasal cannula (3 liters) History of Recent Intubation: No Behavior/Cognition: Alert;Cooperative;Pleasant mood;Confused (drowsy intermittently) Oral Cavity Assessment: Dry Oral Care Completed by SLP: Recent completion by staff Oral Cavity - Dentition: Adequate natural dentition (sore tooth x1 per pt/husband report - impacts chewing) Vision: Functional for self-feeding Self-Feeding Abilities: Able to feed self;Needs assist;Needs set up Patient Positioning: Upright in bed Baseline Vocal Quality: Normal Volitional Cough: Strong Volitional Swallow: Able to elicit    Oral/Motor/Sensory Function Overall Oral Motor/Sensory Function: Within functional limits   Ice Chips Ice chips: Not tested   Thin Liquid Thin Liquid: Within functional limits Presentation: Cup;Self Fed;Straw (~4 ozs total) Other Comments: instructed on drinking slowly; small sips    Nectar Thick Nectar Thick Liquid: Not tested   Honey Thick Honey Thick Liquid: Not tested   Puree Puree: Within functional limits Presentation: Self Fed;Spoon (3-4 ozs)  Solid   GO   Solid: Within functional limits (broken down and moistened well) Presentation: Spoon;Self Fed (x2) Other Comments: pt did c/o a sore tooth/gum area - Husband stated she had been scheduled to go to the dentist for this.        Jerilynn Som, MS, CCC-SLP  Jarita Raval 03/07/2016,4:48 PM

## 2016-03-07 NOTE — Progress Notes (Signed)
MD notified of pt wheezing with not much relief with breathing treatment. Received orders to slow iv fluids to 5oml/hr. Will continue to monitor.

## 2016-03-07 NOTE — Progress Notes (Signed)
Pt arrived from ICU agitated, combative and yelling for her husband.  Dr Verne Carrow notified and ordered haldol IM which was given per order.  Pt confused and pulled out IV in hand.  Husband notified of agitation and stated that he would come in asap.  Sitter in room at bedside.

## 2016-03-07 NOTE — Progress Notes (Signed)
[facility]  ROUNDING NOTE   Subjective:  Patient presents again with multiple falls. Had worsening in her status after hospitalization and now transitioned over to the critical care unit. Currently on BiPAP. Has numerous bruises on her face.   Objective:  Vital signs in last 24 hours:  Temp:  [97.5 F (36.4 C)-98.7 F (37.1 C)] 97.5 F (36.4 C) (07/24 1100) Pulse Rate:  [103-183] 113 (07/24 1200) Resp:  [16-24] 18 (07/24 1200) BP: (91-122)/(59-94) 116/90 (07/24 1200) SpO2:  [92 %-99 %] 95 % (07/24 1200) FiO2 (%):  [35 %] 35 % (07/24 1116) Weight:  [113.6 kg (250 lb 7 oz)-116.3 kg (256 lb 8 oz)] 116.3 kg (256 lb 8 oz) (07/24 0500)  Weight change:  Filed Weights   03/06/16 0706 03/06/16 1615 03/07/16 0500  Weight: 118 kg (260 lb 2.3 oz) 113.6 kg (250 lb 7 oz) 116.3 kg (256 lb 8 oz)    Intake/Output: I/O last 3 completed shifts: In: 1214 [P.O.:300; I.V.:914] Out: 0    Intake/Output this shift:  Total I/O In: 50 [IV Piggyback:50] Out: -   Physical Exam: General: NAD, seated in bed.   Head: Numerous facial bruises  Eyes: Left eye ecchymosis  Neck: Supple, trachea midline  Lungs:  Bilateral crackles on bipap  Heart: irregular  Abdomen:  Soft, nontender, obese  Extremities: 1+ peripheral edema.  Neurologic: Awake, follows simple commands  Skin: No lesions       Basic Metabolic Panel:  Recent Labs Lab 03/06/16 0729 03/07/16 0615  NA 141 141  K 4.2 4.2  CL 105 109  CO2 27 28  GLUCOSE 109* 108*  BUN 32* 27*  CREATININE 2.14* 1.82*  CALCIUM 8.4* 8.4*    Liver Function Tests:  Recent Labs Lab 03/07/16 0615  AST 20  ALT 16  ALKPHOS 118  BILITOT 0.9  PROT 6.1*  ALBUMIN 3.1*   No results for input(s): LIPASE, AMYLASE in the last 168 hours. No results for input(s): AMMONIA in the last 168 hours.  CBC:  Recent Labs Lab 03/06/16 0729 03/07/16 0615  WBC 6.0 5.8  NEUTROABS 4.7  --   HGB 8.9* 7.9*  HCT 28.6* 25.7*  MCV 79.3*  78.7*  PLT 217 206    Cardiac Enzymes:  Recent Labs Lab 03/06/16 0729  TROPONINI <0.03    BNP: Invalid input(s): POCBNP  CBG:  Recent Labs Lab 03/06/16 2120 03/07/16 0610 03/07/16 0730 03/07/16 1003 03/07/16 1155  GLUCAP 127* 105* 108* 98 96    Microbiology: Results for orders placed or performed during the hospital encounter of 03/06/16  Urine culture     Status: Abnormal (Preliminary result)   Collection Time: 03/06/16  8:06 AM  Result Value Ref Range Status   Specimen Description URINE, RANDOM  Final   Special Requests NONE  Final   Culture >=100,000 COLONIES/mL ESCHERICHIA COLI (A)  Final   Report Status PENDING  Incomplete  Culture, blood (routine x 2)     Status: None (Preliminary result)   Collection Time: 03/06/16  9:50 AM  Result Value Ref Range Status   Specimen Description BLOOD LEFT ANTECUBITAL  Final   Special Requests BOTTLES DRAWN AEROBIC AND ANAEROBIC  1CC  Final   Culture NO GROWTH 1 DAY  Final   Report Status PENDING  Incomplete  Culture, blood (routine x 2)     Status: None (Preliminary result)   Collection Time: 03/06/16  9:54 AM  Result Value Ref Range Status   Specimen Description BLOOD RIGHT  HAND  Final   Special Requests BOTTLES DRAWN AEROBIC AND ANAEROBIC  1CC  Final   Culture NO GROWTH 1 DAY  Final   Report Status PENDING  Incomplete    Coagulation Studies: No results for input(s): LABPROT, INR in the last 72 hours.  Urinalysis:  Recent Labs  03/06/16 0806  COLORURINE YELLOW*  LABSPEC 1.017  PHURINE 5.0  GLUCOSEU NEGATIVE  HGBUR 3+*  BILIRUBINUR NEGATIVE  KETONESUR NEGATIVE  PROTEINUR 30*  NITRITE POSITIVE*  LEUKOCYTESUR NEGATIVE      Imaging: Dg Chest 1 View  Result Date: 03/06/2016 CLINICAL DATA:  Fall onto right side, right anterior rib pain. EXAM: CHEST 1 VIEW COMPARISON:  Chest x-rays dated 02/21/2016 and 04/11/2015. FINDINGS: Cardiomegaly is stable. Atherosclerotic changes again noted at the aortic arch.  Coarse interstitial lung markings appear stable. No new lung findings. No pleural effusion or pneumothorax seen. Osseous structures about the chest are unremarkable. Degenerative changes again noted at the left shoulder. No right-sided rib fracture or displacement seen. IMPRESSION: No acute findings.  No rib fracture. Stable cardiomegaly. Aortic atherosclerosis. Coarse interstitial lung markings, stable compared to multiple prior studies suggesting chronic low-grade CHF versus chronic interstitial lung disease. Electronically Signed   By: Bary Richard M.D.   On: 03/06/2016 08:39  Dg Ribs Unilateral Right  Result Date: 03/06/2016 CLINICAL DATA:  Fall onto right side, right anterior rib pain. EXAM: CHEST 1 VIEW COMPARISON:  Chest x-rays dated 02/21/2016 and 04/11/2015. FINDINGS: Cardiomegaly is stable. Atherosclerotic changes again noted at the aortic arch. Coarse interstitial lung markings appear stable. No new lung findings. No pleural effusion or pneumothorax seen. Osseous structures about the chest are unremarkable. Degenerative changes again noted at the left shoulder. No right-sided rib fracture or displacement seen. IMPRESSION: No acute findings.  No rib fracture. Stable cardiomegaly. Aortic atherosclerosis. Coarse interstitial lung markings, stable compared to multiple prior studies suggesting chronic low-grade CHF versus chronic interstitial lung disease. Electronically Signed   By: Bary Richard M.D.   On: 03/06/2016 08:39  Ct Head Wo Contrast  Result Date: 03/06/2016 CLINICAL DATA:  Larey Seat out of bit head several hours ago. Also fell 3 days ago with negative evaluation. EXAM: CT HEAD WITHOUT CONTRAST CT CERVICAL SPINE WITHOUT CONTRAST TECHNIQUE: Multidetector CT imaging of the head and cervical spine was performed following the standard protocol without intravenous contrast. Multiplanar CT image reconstructions of the cervical spine were also generated. COMPARISON:  03/03/2016 FINDINGS: CT HEAD  FINDINGS Frontal scalp hematoma. No underlying skull fracture. The brain shows generalized atrophy with extensive chronic small-vessel ischemic changes throughout white matter. No sign of acute infarction, mass lesion, hydrocephalus or extra-axial collection. No fluid in the sinuses. Bilateral mastoid opacification and right middle ear opacification. CT CERVICAL SPINE FINDINGS No fracture or traumatic malalignment. Ordinary facet arthropathy in mid cervical spondylosis. No soft tissue swelling. Lung apices are clear. Aortic atherosclerosis. IMPRESSION: Head CT: Forehead scalp hematoma. No underlying skull fracture or acute intracranial finding. Atrophy and chronic small vessel disease. Cervical spine CT: No acute or traumatic finding. Chronic degenerative changes as seen previously. Electronically Signed   By: Paulina Fusi M.D.   On: 03/06/2016 08:24  Ct Cervical Spine Wo Contrast  Result Date: 03/06/2016 CLINICAL DATA:  Larey Seat out of bit head several hours ago. Also fell 3 days ago with negative evaluation. EXAM: CT HEAD WITHOUT CONTRAST CT CERVICAL SPINE WITHOUT CONTRAST TECHNIQUE: Multidetector CT imaging of the head and cervical spine was performed following the standard protocol without intravenous contrast. Multiplanar  CT image reconstructions of the cervical spine were also generated. COMPARISON:  03/03/2016 FINDINGS: CT HEAD FINDINGS Frontal scalp hematoma. No underlying skull fracture. The brain shows generalized atrophy with extensive chronic small-vessel ischemic changes throughout white matter. No sign of acute infarction, mass lesion, hydrocephalus or extra-axial collection. No fluid in the sinuses. Bilateral mastoid opacification and right middle ear opacification. CT CERVICAL SPINE FINDINGS No fracture or traumatic malalignment. Ordinary facet arthropathy in mid cervical spondylosis. No soft tissue swelling. Lung apices are clear. Aortic atherosclerosis. IMPRESSION: Head CT: Forehead scalp hematoma.  No underlying skull fracture or acute intracranial finding. Atrophy and chronic small vessel disease. Cervical spine CT: No acute or traumatic finding. Chronic degenerative changes as seen previously. Electronically Signed   By: Paulina Fusi M.D.   On: 03/06/2016 08:24    Medications:   . sodium chloride Stopped (03/07/16 0552)   . antiseptic oral rinse  7 mL Mouth Rinse BID  . aspirin EC  81 mg Oral Daily  . budesonide (PULMICORT) nebulizer solution  0.25 mg Nebulization BID  . cefTRIAXone (ROCEPHIN)  IV  1 g Intravenous Q24H  . diltiazem  180 mg Oral Daily  . docusate sodium  100 mg Oral BID  . fluticasone  1 spray Each Nare Daily  . heparin  5,000 Units Subcutaneous Q8H  . insulin aspart  0-9 Units Subcutaneous TID WC  . insulin glargine  30 Units Subcutaneous q morning - 10a  . ipratropium-albuterol  3 mL Nebulization QID  . levothyroxine  125 mcg Oral QAC breakfast  . pantoprazole  40 mg Oral Daily  . potassium chloride SA  20 mEq Oral Daily  . QUEtiapine  25 mg Oral QHS  . simvastatin  10 mg Oral QHS  . umeclidinium-vilanterol  1 puff Inhalation Daily   acetaminophen **OR** acetaminophen, albuterol, benzonatate, bisacodyl, morphine injection, ondansetron **OR** ondansetron (ZOFRAN) IV, traMADol  Assessment/ Plan:  Allison Shepard is a 82 y.o. white female with atrial fibrillation, hypertension, COPD, osteoarthritis, hyperlipidemia, osteopenia, allergic rhinitis, lumbar spinal stenosis presents with acute exacerbation of congestive heart failure and COPD  1. Chronic Kidney Disease stage IV with proteinuria: Creatinine does fluctuate due to cardiorenal syndrome.  - The patient's renal function has fluctuated a bit.  Creatinine upon admission was 2.14 but is now down to 1.8.  This appears to be close to her baseline.  Continue to monitor renal function during the course of the hospitalization.  2. Acute respiratory failure, likely multifactorial. - patient moved to the  critical care unit this a.m.  Currently on BiPAP. She is DNR status.  3. Anemia of chronic kidney disease: hemoglobin down to 7.9.  We will consider administering Epogen during this hospitalization.  LOS: 0 Sandrina Heaton 7/24/20171:18 PM

## 2016-03-07 NOTE — Significant Event (Signed)
RRT paged for patient due to hypotension and decreased LOC.  Upon my arrival, patient was lethargic; but responsive to pain on CPAP, with 02 sats 96%.  RT at bedside.  ABG already resulted which just showed mild hypoxia. According to patient's primary RN, Patient was given ativan overnight for agitation and became very lethargic.  She was then placed on CPAP and given narcan.  Primary RN could not wake her up to pain this am when RRT was paged and BP was low, then when rechecked SBP in 90's.  CBG WNL.  Afib with HR in 80's (hx Afib/CHF) Lung sounds with audible wheezing and rales throughout.  Given patient's mental status and CPAP, was transferred to SD for closer monitoring.  Vacchani at bedside.

## 2016-03-07 NOTE — Care Management Note (Addendum)
Case Management Note  Patient Details  Name: [name] MRN: [id number] Date of Birth: 06-20-1934  Subjective/Objective:                  Met with patient (not oriented) and her husband after they recently moved patient into ICU. She has 1:1 sitter at bedside for safety. Patient is currently on CPAP (changing to BIPAP). Per husband she recently came home from [facility] (per him she was not there 100 days and PT had released her). She is from home with [facility] [phone]; fax [phone]. They had closed out to them for treatment at [facility] and will need to reopen her case at discharge. Husband states that patient was sent home with a hospital bed from [facility] but side rails (or lack thereof) has allowed her to fall into the floor twice at home. She is on chronic O2 but he is not sure what agency provides it. Husband plans to take her home again with [facility].   Action/Plan:  I have notified [facility] of patient being in ICU currently. They will need orders for hospice at discharge. Patient will probably need EMS to home also.  [facility] will assess patient's hospital bed to see if new arrangements to meet patient's needs can be done while she is in the hospital.   Expected Discharge Date:                  Expected Discharge Plan:     In-House Referral:     Discharge planning Services     Post Acute Care Choice:  Home Health Choice offered to:  Spouse  DME Arranged:    DME Agency:     HH Arranged:    [facility] Agency:     Status of Service:  In process, will continue to follow  If discussed at Long Length of Stay Meetings, dates discussed:    Additional Comments:  Marshell Garfinkel, RN 03/07/2016, 11:39 AM

## 2016-03-07 NOTE — Progress Notes (Signed)
BP 91/67. Rapid response called. Kennyth Arnold, CCU RN and Dr. Elisabeth Pigeon arrived at bedside. Pt lethargic, confused, and minimally responsive. MD order to transfer to stepdown. Patient transferred to CCU with RN at bedside. Report given to Ladona Ridgel, RN in CCU at bedside.

## 2016-03-07 NOTE — Progress Notes (Signed)
Dr. Elisabeth Pigeon notified of patient lethargy, ABG PO2 64, confusion, and not following commands. No new orders at this time. MD to come see patient.

## 2016-03-07 NOTE — Consult Note (Signed)
[facility] [place] [facility] Consultation     ASSESSMENT/PLAN   Patient is a 82 year old female with history of congestive heart failure, COPD. Now presents with acute respiratory failure with pulmonary edema.  PULMONARY A:Acute respiratory failure, hypoxic due to acute pulmonary edema, volume overload. -Chest x-ray images reviewed, consistent with pulmonary edema. -COPD, does not appear to be in exacerbation at this time. P:   -IV Lasix given. -We will follow volume status, monitor kidney function. -ABG reviewed: 7.44/44/64/29.9. -Continue Anoro once daily.  CARDIOVASCULAR A: Volume overload. , Chronic atrial fibrillation. Hypertension. P:  Echocardiogram results from 02/22/16 reviewed: EF 60%.  RENAL A:  Chronic kidney disease. P:   -Patient given an additional dose of IV Lasix 1.  GASTROINTESTINAL A:  --  HEMATOLOGIC A: Microcytic anemia. P:  Transfuse as needed.  INFECTIOUS A:  UTI with sepsis. The Escherichia coli in urine. P:   -Continue ceftriaxone, await urine culture sensitivities.  Micro/culture results:  BCx2 7/23: Negative thus far UC 7/23: Positive for greater than 100 K colonies of Escherichia coli, sensitivities pending. Sputum--  Antibiotics: Ceftriaxone 7/24>>  ENDOCRINE A:  Diabetes mellitus   P:   Continue blood glucose monitoring, sliding-scale insulin, +30 units Lantus every morning.  NEUROLOGIC A:  Encephalopathy, likely secondary to sepsis. P:   We'll continue to monitor.   MAJOR EVENTS/TEST RESULTS:   Best Practices  DVT Prophylaxis: Heparin subcutaneous. GI Prophylaxis: Protonix.   ---------------------------------------  ---------------------------------------   Name: Allison Shepard MRN: [id number] DOB: Sep 26, 1933    ADMISSION DATE:  03/06/2016 CONSULTATION DATE:  03/07/16  REFERRING MD :  Dr. Elisabeth Pigeon.    CHIEF COMPLAINT: dyspnea.    HISTORY OF PRESENT ILLNESS:   Allison Shepard  is a 82  y.o. female with a known history of COPD on home oxygen, congestive heart failure, atrial fibrillation, hypertension, hyperlipidemia, type 2 diabetes mellitus, she was recently admitted to the hospitalon 7/9, discharged on 7/11 for acute congestive heart failure. She presented to the emergency room on 7/20 after a fall with contusion above her right eye. CT of the head was performed and was unremarkable, she was discharged. Patient was brought to the ED again yesterday after she was talking to her husband and then slipped out of bed and struck her right side, repeat CT head showed no acute changes. She was noted to be mildly dehydrated, with UTI. She was admitted to the hospital and underwent hydration, started on IV antibiotics. She was started on CPAP daily at bedtime. Subsequently, early this morning, the patient's respiratory status appeared to have declining, she had bilateral wheezing. Upper response was called, the patient was subsequently transferred to intensive care unit for continuous BiPAP, her CODE STATUS is DO NOT RESUSCITATE. The patient is awake, somewhat alert, answered basic questions  PAST MEDICAL HISTORY :  Past Medical History:  Diagnosis Date  . Arthritis   . Asthma   . Atrial fibrillation (HCC)   . CHF (congestive heart failure) (HCC)   . Chronic kidney disease    history of insufficiency  . Complication of anesthesia    difficulty to wake up after an 8 hour back surgery  . COPD (chronic obstructive pulmonary disease) (HCC)   . Diabetes mellitus without complication (HCC)    type 2  . Dysrhythmia    afib  . Edema    legs/feet  . Headache    migraines - years ago  . Hypercholesteremia   . Hypertension   . Pneumonia   . Shortness  of breath dyspnea   . Spinal disorder    stenosis  . Wheezing    Past Surgical History:  Procedure Laterality Date  . ABDOMINAL HYSTERECTOMY    . BACK SURGERY    . CATARACT EXTRACTION     cataract surgery ? eye  . CATARACT  EXTRACTION W/PHACO Left 07/28/2015   Procedure: CATARACT EXTRACTION PHACO AND INTRAOCULAR LENS PLACEMENT (IOC);  Surgeon: Galen Manila, MD;  Location: [facility] ORS;  Service: Ophthalmology;  Laterality: Left;  [place] 2:16   . COLONOSCOPY     x 3  . ESOPHAGOGASTRODUODENOSCOPY (EGD) WITH PROPOFOL N/A 12/24/2015   Procedure: ESOPHAGOGASTRODUODENOSCOPY (EGD) WITH PROPOFOL;  Surgeon: [name], MD;  Location: [facility] END[facility];  Service: Endoscopy;  Laterality: N/A;  . FRACTURE SURGERY Right    right arm 20 years ago  . HERNIA REPAIR    . JOINT REPLACEMENT    . OPEN REDUCTION INTERNAL FIXATION (ORIF) DISTAL RADIAL FRACTURE Left 06/17/2015   Procedure: OPEN REDUCTION INTERNAL FIXATION LEFT  DISTAL RADIUS AND ULNA FRACTURES;  Surgeon: Tarry Kos, MD;  Location: [facility] OR;  Service: Orthopedics;  Laterality: Left;  . ORIF DISTAL RADIUS FRACTURE Left 06/17/2015  . SHOULDER FUSION SURGERY Right   . TOTAL HIP ARTHROPLASTY Right    hip replacement  . TOTAL KNEE ARTHROPLASTY Right    knee replacement   Prior to Admission medications   Medication Sig Start Date End Date Taking? Authorizing Provider  albuterol (PROVENTIL HFA;VENTOLIN HFA) 108 (90 BASE) MCG/ACT inhaler Inhale 2 puffs into the lungs every 4 (four) hours as needed for wheezing or shortness of breath.    Yes Historical Provider, MD  aspirin EC 81 MG tablet Take 81 mg by mouth daily.   Yes Historical Provider, MD  calcium carbonate (TUMS EX) 750 MG chewable tablet Chew 1 tablet by mouth 2 (two) times daily with a meal.    Yes Historical Provider, MD  Cholecalciferol (VITAMIN D-3) 1000 UNITS CAPS Take 2,000 Units by mouth daily.    Yes Historical Provider, MD  [employer] STOOL SOFTENER 100 MG capsule Take 100 mg by mouth at bedtime. 02/04/16  Yes Historical Provider, MD  diltiazem (CARDIZEM CD) 180 MG 24 hr capsule Take 180 mg by mouth daily.  09/10/15  Yes Historical Provider, MD  fluticasone (FLONASE) 50 MCG/ACT nasal spray Place 1 spray into both nostrils  daily.   Yes Historical Provider, MD  furosemide (LASIX) 40 MG tablet Take 40 mg by mouth every morning.   Yes Historical Provider, MD  hydrocortisone valerate cream (WESTCORT) 0.2 % Apply 1 application topically 2 (two) times daily as needed. For rash.   Yes Historical Provider, MD  insulin aspart (NOVOLOG) 100 UNIT/ML injection Inject 8-12 Units into the skin 3 (three) times daily with meals. Below 200=8 units and every 50 units above she gets two extra units up to 12 units.   Yes Historical Provider, MD  insulin glargine (LANTUS) 100 UNIT/ML injection Inject 0.3 mLs (30 Units total) into the skin every morning. 01/06/16  Yes Shaune Pollack, MD  levothyroxine (SYNTHROID, LEVOTHROID) 125 MCG tablet Take 125 mcg by mouth daily before breakfast.   Yes Historical Provider, MD  metFORMIN (GLUCOPHAGE-XR) 500 MG 24 hr tablet Take 500 mg by mouth daily with supper. 02/26/16  Yes Historical Provider, MD  metoprolol succinate (TOPROL-XL) 100 MG 24 hr tablet Take 100 mg by mouth daily. Take with or immediately following a meal.   Yes Historical Provider, MD  omeprazole (PRILOSEC) 20 MG capsule Take  20 mg by mouth daily.   Yes Historical Provider, MD  potassium chloride SA (K-DUR,KLOR-CON) 20 MEQ tablet Take 20 mEq by mouth daily.   Yes Historical Provider, MD  simvastatin (ZOCOR) 10 MG tablet Take 10 mg by mouth at bedtime.    Yes Historical Provider, MD  traMADol (ULTRAM) 50 MG tablet Take 50 mg by mouth every 6 (six) hours as needed for moderate pain or severe pain.   Yes Historical Provider, MD  umeclidinium-vilanterol (ANORO ELLIPTA) 62.5-25 MCG/INH AEPB Inhale 1 puff into the lungs daily.   Yes Historical Provider, MD  zolpidem (AMBIEN) 5 MG tablet Take 5 mg by mouth at bedtime as needed for sleep. Reported on 09/08/2015   Yes Historical Provider, MD  Fluticasone-Salmeterol (ADVAIR DISKUS) 250-50 MCG/DOSE AEPB Inhale 1 puff into the lungs 2 (two) times daily. 01/06/16   Shaune Pollack, MD  furosemide (LASIX) 20 MG  tablet Take 2 tablets (40 mg total) by mouth daily. 02/23/16   Adrian Saran, MD  guaiFENesin (ROBITUSSIN) 100 MG/5ML SOLN Take 5 mLs (100 mg total) by mouth every 4 (four) hours as needed for cough or to loosen phlegm. 01/06/16   Shaune Pollack, MD  levofloxacin (LEVAQUIN) 250 MG tablet Take 1 tablet (250 mg total) by mouth daily. 02/23/16   Adrian Saran, MD  metoprolol tartrate (LOPRESSOR) 25 MG tablet Take 0.5 tablets (12.5 mg total) by mouth 2 (two) times daily. 01/06/16   Shaune Pollack, MD  senna-docusate (SENOKOT-S) 8.6-50 MG tablet Take 1 tablet by mouth at bedtime as needed for mild constipation. 01/06/16   Shaune Pollack, MD   Allergies  Allergen Reactions  . Hydrocodone-Chlorpheniramine Other (See Comments)    Confusion  . Hydromorphone Hcl Nausea And Vomiting  . Ibuprofen Other (See Comments)    Pt was told by her MD not to take this medication.    . Mucinex [Guaifenesin Er] Other (See Comments)    Red all over. Felt like i was on fire  . Percocet [Oxycodone-Acetaminophen] Hives    FAMILY HISTORY:  Family History  Problem Relation Age of Onset  . Heart disease Father    SOCIAL HISTORY:  reports that she quit smoking about 23 years ago. She has never used smokeless tobacco. She reports that she drinks about 8.4 oz of alcohol per week . She reports that she does not use drugs.  REVIEW OF SYSTEMS:   Could not obtain due to altered mental status, patient is on BiPAP.   VITAL SIGNS: Temp:  [98 F (36.7 C)-98.9 F (37.2 C)] 98.7 F (37.1 C) (07/24 0546) Pulse Rate:  [94-183] 183 (07/24 0936) Resp:  [13-24] 18 (07/24 0936) BP: (91-121)/(59-89) 91/67 (07/24 0936) SpO2:  [90 %-99 %] 92 % (07/24 0936) Weight:  [250 lb 7 oz (113.6 kg)-256 lb 8 oz (116.3 kg)] 256 lb 8 oz (116.3 kg) (07/24 0500) HEMODYNAMICS:   VENTILATOR SETTINGS:   INTAKE / OUTPUT:  Intake/Output Summary (Last 24 hours) at 03/07/16 1042 Last data filed at 03/07/16 0700  Gross per 24 hour  Intake             1214 ml  Output                 0 ml  Net             1214 ml    Physical Examination:   VS: BP 91/67   Pulse (!) 183   Temp 98.7 F (37.1 C) (Axillary)   Resp 18  Ht  (1.651 m)   Wt 256 lb 8 oz (116.3 kg)   SpO2 92%   BMI 42.68 kg/m   General Appearance: No distress  Neuro:without focal findings, mental status reduced. HEENT: PERRLA, EOM intact, no ptosis, no other lesions noticed;  Pulmonary:  diaphragmatic excursion normal. Decreased air entry both lungs, with scattered wheezing. CardiovascularNormal S1,S2.  No m/r/g.    Abdomen: Benign, Soft, non-tender, No masses, hepatosplenomegaly, No lymphadenopathy Renal:  No costovertebral tenderness  GU:  Not performed at this time. Endoc: No evident thyromegaly, no signs of acromegaly. Skin:   warm, no rashes, no ecchymosis  Extremities: normal, no cyanosis, clubbing, no edema, warm with normal capillary refill.    LABS: Reviewed   LABORATORY PANEL:   CBC  Recent Labs Lab 03/07/16 0615  WBC 5.8  HGB 7.9*  HCT 25.7*  PLT 206    Chemistries   Recent Labs Lab 03/07/16 0615  NA 141  K 4.2  CL 109  CO2 28  GLUCOSE 108*  BUN 27*  CREATININE 1.82*  CALCIUM 8.4*  AST 20  ALT 16  ALKPHOS 118  BILITOT 0.9     Recent Labs Lab 03/06/16 1349 03/06/16 1641 03/06/16 2120 03/07/16 0610 03/07/16 0730 03/07/16 1003  GLUCAP 79 123* 127* 105* 108* 98    Recent Labs Lab 03/06/16 0752 03/07/16 0745  PHART 7.40 7.44  PCO2ART 48 44  PO2ART 75* 64*    Recent Labs Lab 03/07/16 0615  AST 20  ALT 16  ALKPHOS 118  BILITOT 0.9  ALBUMIN 3.1*    Cardiac Enzymes  Recent Labs Lab 03/06/16 0729  TROPONINI <0.03    RADIOLOGY:  Dg Chest 1 View  Result Date: 03/06/2016 CLINICAL DATA:  Fall onto right side, right anterior rib pain. EXAM: CHEST 1 VIEW COMPARISON:  Chest x-rays dated 02/21/2016 and 04/11/2015. FINDINGS: Cardiomegaly is stable. Atherosclerotic changes again noted at the aortic arch. Coarse interstitial  lung markings appear stable. No new lung findings. No pleural effusion or pneumothorax seen. Osseous structures about the chest are unremarkable. Degenerative changes again noted at the left shoulder. No right-sided rib fracture or displacement seen. IMPRESSION: No acute findings.  No rib fracture. Stable cardiomegaly. Aortic atherosclerosis. Coarse interstitial lung markings, stable compared to multiple prior studies suggesting chronic low-grade CHF versus chronic interstitial lung disease. Electronically Signed   By: Bary Richard M.D.   On: 03/06/2016 08:39  Dg Ribs Unilateral Right  Result Date: 03/06/2016 CLINICAL DATA:  Fall onto right side, right anterior rib pain. EXAM: CHEST 1 VIEW COMPARISON:  Chest x-rays dated 02/21/2016 and 04/11/2015. FINDINGS: Cardiomegaly is stable. Atherosclerotic changes again noted at the aortic arch. Coarse interstitial lung markings appear stable. No new lung findings. No pleural effusion or pneumothorax seen. Osseous structures about the chest are unremarkable. Degenerative changes again noted at the left shoulder. No right-sided rib fracture or displacement seen. IMPRESSION: No acute findings.  No rib fracture. Stable cardiomegaly. Aortic atherosclerosis. Coarse interstitial lung markings, stable compared to multiple prior studies suggesting chronic low-grade CHF versus chronic interstitial lung disease. Electronically Signed   By: Bary Richard M.D.   On: 03/06/2016 08:39  Ct Head Wo Contrast  Result Date: 03/06/2016 CLINICAL DATA:  Larey Seat out of bit head several hours ago. Also fell 3 days ago with negative evaluation. EXAM: CT HEAD WITHOUT CONTRAST CT CERVICAL SPINE WITHOUT CONTRAST TECHNIQUE: Multidetector CT imaging of the head and cervical spine was performed following the standard protocol without intravenous contrast. Multiplanar CT  image reconstructions of the cervical spine were also generated. COMPARISON:  03/03/2016 FINDINGS: CT HEAD FINDINGS Frontal scalp  hematoma. No underlying skull fracture. The brain shows generalized atrophy with extensive chronic small-vessel ischemic changes throughout white matter. No sign of acute infarction, mass lesion, hydrocephalus or extra-axial collection. No fluid in the sinuses. Bilateral mastoid opacification and right middle ear opacification. CT CERVICAL SPINE FINDINGS No fracture or traumatic malalignment. Ordinary facet arthropathy in mid cervical spondylosis. No soft tissue swelling. Lung apices are clear. Aortic atherosclerosis. IMPRESSION: Head CT: Forehead scalp hematoma. No underlying skull fracture or acute intracranial finding. Atrophy and chronic small vessel disease. Cervical spine CT: No acute or traumatic finding. Chronic degenerative changes as seen previously. Electronically Signed   By: Paulina Fusi M.D.   On: 03/06/2016 08:24  Ct Cervical Spine Wo Contrast  Result Date: 03/06/2016 CLINICAL DATA:  Larey Seat out of bit head several hours ago. Also fell 3 days ago with negative evaluation. EXAM: CT HEAD WITHOUT CONTRAST CT CERVICAL SPINE WITHOUT CONTRAST TECHNIQUE: Multidetector CT imaging of the head and cervical spine was performed following the standard protocol without intravenous contrast. Multiplanar CT image reconstructions of the cervical spine were also generated. COMPARISON:  03/03/2016 FINDINGS: CT HEAD FINDINGS Frontal scalp hematoma. No underlying skull fracture. The brain shows generalized atrophy with extensive chronic small-vessel ischemic changes throughout white matter. No sign of acute infarction, mass lesion, hydrocephalus or extra-axial collection. No fluid in the sinuses. Bilateral mastoid opacification and right middle ear opacification. CT CERVICAL SPINE FINDINGS No fracture or traumatic malalignment. Ordinary facet arthropathy in mid cervical spondylosis. No soft tissue swelling. Lung apices are clear. Aortic atherosclerosis. IMPRESSION: Head CT: Forehead scalp hematoma. No underlying skull  fracture or acute intracranial finding. Atrophy and chronic small vessel disease. Cervical spine CT: No acute or traumatic finding. Chronic degenerative changes as seen previously. Electronically Signed   By: Paulina Fusi M.D.   On: 03/06/2016 08:24      --Wells Guiles, MD.  Board Certified in Internal Medicine, Pulmonary Medicine, Critical Care Medicine, and Sleep Medicine.  ICU Pager [phone] [place] [facility] Office Number: [phone]  Santiago Glad, M.D.  Stephanie Acre, M.D.  [name], M.D   03/07/2016, 10:42 AM  Critical Care Attestation.  I have personally obtained a history, examined the patient, evaluated laboratory and imaging results, formulated the assessment and plan and placed orders. The Patient requires high complexity decision making for assessment and support, frequent evaluation and titration of therapies, application of advanced monitoring technologies and extensive interpretation of multiple databases. The patient has critical illness that could lead imminently to failure of 1 or more organ systems and requires the highest level of physician preparedness to intervene.  Critical Care Time devoted to patient care services described in this note is 35 minutes and is exclusive of time spent in procedures.

## 2016-03-07 NOTE — Progress Notes (Signed)
Dr. Tobi Bastos made aware of pt increased lethargy and unable to take PO meds this AM. Received orders to give iv lasix and iv narcan (to counteract the ativan given during the night). Narcan given, pt is sightly more alert but still not following commands. MD paged, awaiting call back. VSS. Will continue to monitor.

## 2016-03-07 NOTE — Progress Notes (Signed)
[facility] [place] [facility]   PATIENT NAME: Allison Shepard    MR#:  [id number]  DATE OF BIRTH:  1934-04-27  SUBJECTIVE:  CHIEF COMPLAINT:   Chief Complaint  Patient presents with  . Fall    Rapid response called in morning.  Pt had altered mental status and Hypotension.  REVIEW OF SYSTEMS:   Pt was having AMS, so can not give ROS.    ROS  DRUG ALLERGIES:   Allergies  Allergen Reactions  . Hydrocodone-Chlorpheniramine Other (See Comments)    Confusion  . Hydromorphone Hcl Nausea And Vomiting  . Ibuprofen Other (See Comments)    Pt was told by her MD not to take this medication.    . Mucinex [Guaifenesin Er] Other (See Comments)    Red all over. Felt like i was on fire  . Percocet [Oxycodone-Acetaminophen] Hives    VITALS:  Blood pressure 105/76, pulse (!) 109, temperature 98.3 F (36.8 C), temperature source Oral, resp. rate 18, height  (1.651 m), weight 116.3 kg (256 lb 8 oz), SpO2 97 %.  PHYSICAL EXAMINATION:  GENERAL:  82 y.o.-year-old patient lying in the bed with acute distress.  EYES: Pupils equal, round, reactive to light and accommodation. No scleral icterus. Extraocular muscles intact.  HEENT: Head atraumatic, normocephalic. Oropharynx and nasopharynx clear.  NECK:  Supple, no jugular venous distention. No thyroid enlargement, no tenderness.  LUNGS: Normal breath sounds bilaterally, no wheezing, some crepitation. No use of accessory muscles of respiration.  CARDIOVASCULAR: S1, S2 normal. No murmurs, rubs, or gallops.  ABDOMEN: Soft, nontender, nondistended. Bowel sounds present. No organomegaly or mass.  EXTREMITIES: No pedal edema, cyanosis, or clubbing.  NEUROLOGIC: Pt is drowsy, opens eyes to stimuli, and goes back to sleep. PSYCHIATRIC: The patient is drowsy.  SKIN: No obvious rash, lesion, or ulcer.   Physical Exam LABORATORY PANEL:   CBC  Recent Labs Lab 03/07/16 0615  WBC 5.8  HGB 7.9*  HCT 25.7*  PLT 206    ------------------------------------------------------------------------------------------------------------------  Chemistries   Recent Labs Lab 03/07/16 0615  NA 141  K 4.2  CL 109  CO2 28  GLUCOSE 108*  BUN 27*  CREATININE 1.82*  CALCIUM 8.4*  AST 20  ALT 16  ALKPHOS 118  BILITOT 0.9   ------------------------------------------------------------------------------------------------------------------  Cardiac Enzymes  Recent Labs Lab 03/06/16 0729  TROPONINI <0.03   ------------------------------------------------------------------------------------------------------------------  RADIOLOGY:  Dg Chest 1 View  Result Date: 03/06/2016 CLINICAL DATA:  Fall onto right side, right anterior rib pain. EXAM: CHEST 1 VIEW COMPARISON:  Chest x-rays dated 02/21/2016 and 04/11/2015. FINDINGS: Cardiomegaly is stable. Atherosclerotic changes again noted at the aortic arch. Coarse interstitial lung markings appear stable. No new lung findings. No pleural effusion or pneumothorax seen. Osseous structures about the chest are unremarkable. Degenerative changes again noted at the left shoulder. No right-sided rib fracture or displacement seen. IMPRESSION: No acute findings.  No rib fracture. Stable cardiomegaly. Aortic atherosclerosis. Coarse interstitial lung markings, stable compared to multiple prior studies suggesting chronic low-grade CHF versus chronic interstitial lung disease. Electronically Signed   By: Bary Richard M.D.   On: 03/06/2016 08:39  Dg Ribs Unilateral Right  Result Date: 03/06/2016 CLINICAL DATA:  Fall onto right side, right anterior rib pain. EXAM: CHEST 1 VIEW COMPARISON:  Chest x-rays dated 02/21/2016 and 04/11/2015. FINDINGS: Cardiomegaly is stable. Atherosclerotic changes again noted at the aortic arch. Coarse interstitial lung markings appear stable. No new lung findings. No pleural effusion or pneumothorax seen. Osseous structures  about the chest are unremarkable.  Degenerative changes again noted at the left shoulder. No right-sided rib fracture or displacement seen. IMPRESSION: No acute findings.  No rib fracture. Stable cardiomegaly. Aortic atherosclerosis. Coarse interstitial lung markings, stable compared to multiple prior studies suggesting chronic low-grade CHF versus chronic interstitial lung disease. Electronically Signed   By: Bary Richard M.D.   On: 03/06/2016 08:39  Ct Head Wo Contrast  Result Date: 03/06/2016 CLINICAL DATA:  Larey Seat out of bit head several hours ago. Also fell 3 days ago with negative evaluation. EXAM: CT HEAD WITHOUT CONTRAST CT CERVICAL SPINE WITHOUT CONTRAST TECHNIQUE: Multidetector CT imaging of the head and cervical spine was performed following the standard protocol without intravenous contrast. Multiplanar CT image reconstructions of the cervical spine were also generated. COMPARISON:  03/03/2016 FINDINGS: CT HEAD FINDINGS Frontal scalp hematoma. No underlying skull fracture. The brain shows generalized atrophy with extensive chronic small-vessel ischemic changes throughout white matter. No sign of acute infarction, mass lesion, hydrocephalus or extra-axial collection. No fluid in the sinuses. Bilateral mastoid opacification and right middle ear opacification. CT CERVICAL SPINE FINDINGS No fracture or traumatic malalignment. Ordinary facet arthropathy in mid cervical spondylosis. No soft tissue swelling. Lung apices are clear. Aortic atherosclerosis. IMPRESSION: Head CT: Forehead scalp hematoma. No underlying skull fracture or acute intracranial finding. Atrophy and chronic small vessel disease. Cervical spine CT: No acute or traumatic finding. Chronic degenerative changes as seen previously. Electronically Signed   By: Paulina Fusi M.D.   On: 03/06/2016 08:24  Ct Cervical Spine Wo Contrast  Result Date: 03/06/2016 CLINICAL DATA:  Larey Seat out of bit head several hours ago. Also fell 3 days ago with negative evaluation. EXAM: CT HEAD  WITHOUT CONTRAST CT CERVICAL SPINE WITHOUT CONTRAST TECHNIQUE: Multidetector CT imaging of the head and cervical spine was performed following the standard protocol without intravenous contrast. Multiplanar CT image reconstructions of the cervical spine were also generated. COMPARISON:  03/03/2016 FINDINGS: CT HEAD FINDINGS Frontal scalp hematoma. No underlying skull fracture. The brain shows generalized atrophy with extensive chronic small-vessel ischemic changes throughout white matter. No sign of acute infarction, mass lesion, hydrocephalus or extra-axial collection. No fluid in the sinuses. Bilateral mastoid opacification and right middle ear opacification. CT CERVICAL SPINE FINDINGS No fracture or traumatic malalignment. Ordinary facet arthropathy in mid cervical spondylosis. No soft tissue swelling. Lung apices are clear. Aortic atherosclerosis. IMPRESSION: Head CT: Forehead scalp hematoma. No underlying skull fracture or acute intracranial finding. Atrophy and chronic small vessel disease. Cervical spine CT: No acute or traumatic finding. Chronic degenerative changes as seen previously. Electronically Signed   By: Paulina Fusi M.D.   On: 03/06/2016 08:24  US Venous Img Lower Unilateral Left  Result Date: 03/07/2016 CLINICAL DATA:  Swelling. EXAM: LEFT LOWER EXTREMITY VENOUS DOPPLER ULTRASOUND TECHNIQUE: Gray-scale sonography with graded compression, as well as color Doppler and duplex ultrasound were performed to evaluate the lower extremity deep venous systems from the level of the common femoral vein and including the common femoral, femoral, profunda femoral, popliteal and calf veins including the posterior tibial, peroneal and gastrocnemius veins when visible. The superficial great saphenous vein was also interrogated. Spectral Doppler was utilized to evaluate flow at rest and with distal augmentation maneuvers in the common femoral, femoral and popliteal veins. COMPARISON:  None. FINDINGS:  Contralateral Common Femoral Vein: Respiratory phasicity is normal and symmetric with the symptomatic side. No evidence of thrombus. Normal compressibility. Common Femoral Vein: No evidence of thrombus. Normal compressibility, respiratory phasicity and response  to augmentation. Saphenofemoral Junction: No evidence of thrombus. Normal compressibility and flow on color Doppler imaging. Profunda Femoral Vein: No evidence of thrombus. Normal compressibility and flow on color Doppler imaging. Femoral Vein: No evidence of thrombus. Normal compressibility, respiratory phasicity and response to augmentation. Popliteal Vein: No evidence of thrombus. Normal compressibility, respiratory phasicity and response to augmentation. Calf Veins: No evidence of thrombus. Normal compressibility and flow on color Doppler imaging. Superficial Great Saphenous Vein: No evidence of thrombus. Normal compressibility and flow on color Doppler imaging. Venous Reflux:  None. Other Findings:  6.5 x 1.6 cm left popliteal fossa Baker cyst. IMPRESSION: 1.  6.5 x 1.6 cm left popliteal fossa Baker's cyst. 2. Exam is otherwise unremarkable.  No DVT. Electronically Signed   By: Maisie Fus  Register   On: 03/07/2016 13:57   ASSESSMENT AND PLAN:   Principal Problem:   UTI (lower urinary tract infection) Active Problems:   Frequent falls   COPD (chronic obstructive pulmonary disease) (HCC)   CKD (chronic kidney disease) stage 3, GFR 30-59 ml/min   Lethargy  * Altered mental status   Due to UTI and Pulm edema.   Need CPAP.   She was given ativan in night due to agitation, but did not respond much to narcaine.   Transfer and monitor in [place].    CT head negative    * UTI   Ceftriaxone, Cx.  * COPD   No exacerbation.  * Ac diastolic CHF   IV lasix.  * ch anemia   Monitor.  * CKD   Monitor.   Nephrology consult.  All the records are reviewed and case discussed with Care Management/Social Workerr. Management plans discussed  with the patient, family and they are in agreement.  CODE STATUS: DNR  TOTAL TIME TAKING CARE OF THIS PATIENT: 40 critical care minutes.   Spoke to pt's husband about transfer.  POSSIBLE D/C IN 2-3 DAYS, DEPENDING ON CLINICAL CONDITION.   Altamese Dilling M.D on 03/07/2016   Between 7am to 6pm - Pager - [phone]  After 6pm go to [email or web address] - password [email or web address]  [facility]  Office  [phone]  CC: Primary care physician; Mickey Farber, MD  Note: This dictation was prepared with Dragon dictation along with smaller phrase technology. Any transcriptional errors that result from this process are unintentional.

## 2016-03-07 NOTE — Progress Notes (Signed)
Spoke with pharmacist on unit about switching all scheduled medications to IV since patient is on continuous bi-pap and will remain NPO.  Will continue to monitor patient.

## 2016-03-07 NOTE — Progress Notes (Signed)
Patient arrive to ICU agitated, non rebreather in place. Switched to CPAP, RT notified. Will continue to monitor patient.

## 2016-03-07 NOTE — Progress Notes (Signed)
MD notified of pt BP and yesterdays creatinine. Okay to hold fluids for now and give lasix

## 2016-03-08 ENCOUNTER — Inpatient Hospital Stay: Payer: Medicare Other

## 2016-03-08 LAB — URINE CULTURE: Culture: >=100000 — AB

## 2016-03-08 LAB — BASIC METABOLIC PANEL
ANION GAP: 8 (ref 5–15)
BUN: 21 mg/dL — AB (ref 6–20)
CHLORIDE: 103 mmol/L (ref 101–111)
CO2: 30 mmol/L (ref 22–32)
Calcium: 8.4 mg/dL — ABNORMAL LOW (ref 8.9–10.3)
Creatinine, Ser: 1.72 mg/dL — ABNORMAL HIGH (ref 0.44–1.00)
GFR calc Af Amer: 31 mL/min — ABNORMAL LOW (ref >60)
GFR, EST NON AFRICAN AMERICAN: 27 mL/min — AB (ref >60)
Glucose, Bld: 123 mg/dL — ABNORMAL HIGH (ref 65–99)
POTASSIUM: 3.7 mmol/L (ref 3.5–5.1)
SODIUM: 141 mmol/L (ref 135–145)

## 2016-03-08 LAB — CBC
HCT: 27.7 % — ABNORMAL LOW (ref 35.0–47.0)
HEMOGLOBIN: 8.5 g/dL — AB (ref 12.0–16.0)
MCH: 24.2 pg — AB (ref 26.0–34.0)
MCHC: 30.6 g/dL — ABNORMAL LOW (ref 32.0–36.0)
MCV: 79.1 fL — AB (ref 80.0–100.0)
PLATELETS: 220 10*3/uL (ref 150–440)
RBC: 3.5 MIL/uL — AB (ref 3.80–5.20)
RDW: 20.9 % — ABNORMAL HIGH (ref 11.5–14.5)
WBC: 5.3 10*3/uL (ref 3.6–11.0)

## 2016-03-08 LAB — IRON AND TIBC
IRON: 25 ug/dL — AB (ref 28–170)
SATURATION RATIOS: 6 % — AB (ref 10.4–31.8)
TIBC: 444 ug/dL (ref 250–450)
UIBC: 419 ug/dL

## 2016-03-08 LAB — GLUCOSE, CAPILLARY
GLUCOSE-CAPILLARY: 88 mg/dL (ref 65–99)
GLUCOSE-CAPILLARY: 123 mg/dL — AB (ref 65–99)

## 2016-03-08 MED ORDER — ZIPRASIDONE MESYLATE 20 MG IM SOLR: 20.0000 mg | Freq: Once | INTRAMUSCULAR | Status: DC

## 2016-03-08 MED ORDER — IPRATROPIUM-ALBUTEROL 0.5-2.5 (3) MG/3ML IN SOLN: 3.0000 mL | Freq: Two times a day (BID) | RESPIRATORY_TRACT | Status: DC

## 2016-03-08 MED ORDER — EPOETIN ALFA 20000 UNIT/ML IJ SOLN
20000.0000 [IU] | INTRAMUSCULAR | Status: DC
  Administered 2016-03-08: 20000 [IU] via SUBCUTANEOUS
  Filled 2016-03-08: qty 1

## 2016-03-08 MED ORDER — ZIPRASIDONE MESYLATE 20 MG IM SOLR
10.0000 mg | Freq: Three times a day (TID) | INTRAMUSCULAR | Status: DC | PRN
  Filled 2016-03-08: qty 20

## 2016-03-08 MED ORDER — ZIPRASIDONE MESYLATE 20 MG IM SOLR
10.0000 mg | Freq: Once | INTRAMUSCULAR | Status: AC
  Administered 2016-03-08: 10 mg via INTRAMUSCULAR
  Filled 2016-03-08: qty 20

## 2016-03-08 MED ORDER — CEPHALEXIN 500 MG PO CAPS: 500.0000 mg | ORAL_CAPSULE | Freq: Three times a day (TID) | ORAL | 0 refills | Status: DC

## 2016-03-08 NOTE — Care Management Note (Signed)
Case Management Note  Patient Details  Name: Allison Shepard MRN: [id number] Date of Birth: 05/23/1934  Subjective/Objective:    This Clinical research associate called [facility] and faxed [name]'s discharge orders to [facility] at [phone]. Husband is transporting [name] home today.                Action/Plan:   Expected Discharge Date:                  Expected Discharge Plan:     In-House Referral:     Discharge planning Services     Post Acute Care Choice:  Home Health Choice offered to:  Spouse  DME Arranged:    DME Agency:     HH Arranged:    HH Agency:     Status of Service:  In process, will continue to follow  If discussed at Long Length of Stay Meetings, dates discussed:    Additional Comments:  Chitara Clonch A, RN 03/08/2016, 2:14 PM

## 2016-03-08 NOTE — Progress Notes (Signed)
Notified Dr. Elisabeth Pigeon regarding patient's agitation and combativeness. MD acknowledge and to place new orders.

## 2016-03-08 NOTE — Plan of Care (Signed)
Problem: SLP Dysphagia Goals Goal: Misc Dysphagia Goal Pt will safely tolerate po diet of least restrictive consistency w/ no overt s/s of aspiration noted by Staff/pt/family x3 sessions.    

## 2016-03-08 NOTE — Clinical Social Work Note (Signed)
Clinical Social Work Assessment  Patient Details  Name: Allison Shepard MRN: [id number] Date of Birth: 1934-08-12  Date of referral:  03/08/16               Reason for consult:  Facility Placement                Permission sought to share information with:  Family Supports Permission granted to share information::  Yes, Verbal Permission Granted  Name::        Agency::     Relationship::     Contact Information:     Housing/Transportation Living arrangements for the past 2 months:  Single Family Home Source of Information:  Patient, Spouse Patient Interpreter Needed:  None Criminal Activity/Legal Involvement Pertinent to Current Situation/Hospitalization:  No - Comment as needed Significant Relationships:  Spouse Lives with:  Spouse Do you feel safe going back to the place where you live?  Yes Need for family participation in patient care:  Yes (Comment)  Care giving concerns:  Patient is mostly total care and is cared for by her husband.   Social Worker assessment / plan:  CSW informed by physician this morning that he was going to have patient assessed by PT and if they recommended return home she could go home today or if they recommended rehab, CSW could begin STR. CSW spoke with patient this afternoon and she informed CSW that if given the choice, she would prefer to return to her previous arrangements of home with [facility] following. Patient confirmed she resides with her husband and she gave CSW permission to contact him. CSW contacted patient's husband and he is in agreement with her returning home with hospice. He stated he would speak with her about her trying to get up out of bed on her own because he does not want her falling out of bed again. When CSW explained the option of STR, patient's husband stated that they would not be able to afford her copayment under medicare as she had already utilized her first 20 days at [facility] recently. When CSW offered to  have patient return home today via EMS, patient's husband stated that he would transport her home and had a portable oxygen tank. Patient's nurse to call patient's husband when she is ready for patient to be picked up. Physician notified and updated. RN CM updated.  Employment status:  Retired Health and safety inspector:  Medicare PT Recommendations:  Not assessed at this time Information / Referral to community resources:     Patient/Family's Response to care:  Patient and husband appreciative of CSW assistance.  Patient/Family's Understanding of and Emotional Response to Diagnosis, Current Treatment, and Prognosis:  Patient expressed her desired wishes for returning home rather than STR.  Emotional Assessment Appearance:  Appears stated age Attitude/Demeanor/Rapport:   (pleasant and cooperative) Affect (typically observed):  Calm, Stable, Appropriate Orientation:  Oriented to Self, Oriented to Place, Oriented to Situation Alcohol / Substance use:  Not Applicable Psych involvement (Current and /or in the community):  No (Comment)  Discharge Needs  Concerns to be addressed:  Care Coordination Readmission within the last 30 days:  Yes Current discharge risk:  Terminally ill, Chronically ill Barriers to Discharge:  No Barriers Identified   York Spaniel, LCSW 03/08/2016, 2:19 PM

## 2016-03-08 NOTE — Discharge Summary (Signed)
[facility] - [place] at [facility]   PATIENT NAME: Allison Shepard    MR#:  [id number]  DATE OF BIRTH:  1934-01-04  DATE OF ADMISSION:  03/06/2016 ADMITTING PHYSICIAN: Marguarite Arbour, MD  DATE OF DISCHARGE: 03/08/2016  PRIMARY CARE PHYSICIAN: Mickey Farber, MD    ADMISSION DIAGNOSIS:  Confusion [R41.0] UTI (lower urinary tract infection) [N39.0] Fall [W19.XXXA] Multiple falls [R29.6] Rib pain on right side [R07.81] Facial contusion, initial encounter [S00.83XA] Skin tear of right upper arm without complication, initial encounter [S41.101A]  DISCHARGE DIAGNOSIS:  Principal Problem:   UTI (lower urinary tract infection) Active Problems:   Frequent falls   COPD (chronic obstructive pulmonary disease) (HCC)   CKD (chronic kidney disease) stage 3, GFR 30-59 ml/min   Lethargy   SECONDARY DIAGNOSIS:   Past Medical History:  Diagnosis Date  . Arthritis   . Asthma   . Atrial fibrillation (HCC)   . CHF (congestive heart failure) (HCC)   . Chronic kidney disease    history of insufficiency  . Complication of anesthesia    difficulty to wake up after an 8 hour back surgery  . COPD (chronic obstructive pulmonary disease) (HCC)   . Diabetes mellitus without complication (HCC)    type 2  . Dysrhythmia    afib  . Edema    legs/feet  . Headache    migraines - years ago  . Hypercholesteremia   . Hypertension   . Pneumonia   . Shortness of breath dyspnea   . Spinal disorder    stenosis  . Wheezing     HOSPITAL COURSE:   * Altered mental status- delirium   Due to UTI and Pulm edema.   Needed CPAP.   She was given ativan in night due to agitation, but did not respond much to narcaine.   Transfer and monitor in [place].    CT head negative   She had delirium due to change in surroundings, felt much better today in presence of her husband.   Required Geodone injections in night.    * UTI   Ceftriaxone, Cx showing E coli- sensitive to  Ceftriaxone.    Oral keflex on d/c.  * COPD   No exacerbation.  * Ac diastolic CHF   IV lasix.   Cont oral lasix on d/c with KCL  * ch anemia   Monitor.  * CKD   Monitor.   Nephrology consult.   Ata baseline now.   DISCHARGE CONDITIONS:   Stable.  CONSULTS OBTAINED:  Treatment Team:  Mosetta Pigeon, MD Dalia Heading, MD  DRUG ALLERGIES:   Allergies  Allergen Reactions  . Hydrocodone-Chlorpheniramine Other (See Comments)    Confusion  . Hydromorphone Hcl Nausea And Vomiting  . Ibuprofen Other (See Comments)    Pt was told by her MD not to take this medication.    . Mucinex [Guaifenesin Er] Other (See Comments)    Red all over. Felt like i was on fire  . Percocet [Oxycodone-Acetaminophen] Hives    DISCHARGE MEDICATIONS:   Current Discharge Medication List    START taking these medications   Details  cephALEXin (KEFLEX) 500 MG capsule Take 1 capsule (500 mg total) by mouth 3 (three) times daily. Qty: 15 capsule, Refills: 0      CONTINUE these medications which have NOT CHANGED   Details  albuterol (PROVENTIL HFA;VENTOLIN HFA) 108 (90 BASE) MCG/ACT inhaler Inhale 2 puffs into the lungs every 4 (four) hours as needed for wheezing  or shortness of breath.     aspirin EC 81 MG tablet Take 81 mg by mouth daily.    calcium carbonate (TUMS EX) 750 MG chewable tablet Chew 1 tablet by mouth 2 (two) times daily with a meal.     Cholecalciferol (VITAMIN D-3) 1000 UNITS CAPS Take 2,000 Units by mouth daily.     [employer] STOOL SOFTENER 100 MG capsule Take 100 mg by mouth at bedtime. Refills: 3    diltiazem (CARDIZEM CD) 180 MG 24 hr capsule Take 180 mg by mouth daily.     fluticasone (FLONASE) 50 MCG/ACT nasal spray Place 1 spray into both nostrils daily.    furosemide (LASIX) 40 MG tablet Take 40 mg by mouth every morning.    hydrocortisone valerate cream (WESTCORT) 0.2 % Apply 1 application topically 2 (two) times daily as needed. For rash.    insulin aspart  (NOVOLOG) 100 UNIT/ML injection Inject 8-12 Units into the skin 3 (three) times daily with meals. Below 200=8 units and every 50 units above she gets two extra units up to 12 units.    insulin glargine (LANTUS) 100 UNIT/ML injection Inject 0.3 mLs (30 Units total) into the skin every morning. Qty: 10 mL, Refills: 3    levothyroxine (SYNTHROID, LEVOTHROID) 125 MCG tablet Take 125 mcg by mouth daily before breakfast.    omeprazole (PRILOSEC) 20 MG capsule Take 20 mg by mouth daily.    potassium chloride SA (K-DUR,KLOR-CON) 20 MEQ tablet Take 20 mEq by mouth daily.    simvastatin (ZOCOR) 10 MG tablet Take 10 mg by mouth at bedtime.     traMADol (ULTRAM) 50 MG tablet Take 50 mg by mouth every 6 (six) hours as needed for moderate pain or severe pain.    umeclidinium-vilanterol (ANORO ELLIPTA) 62.5-25 MCG/INH AEPB Inhale 1 puff into the lungs daily.    zolpidem (AMBIEN) 5 MG tablet Take 5 mg by mouth at bedtime as needed for sleep. Reported on 09/08/2015    Fluticasone-Salmeterol (ADVAIR DISKUS) 250-50 MCG/DOSE AEPB Inhale 1 puff into the lungs 2 (two) times daily. Qty: 60 each, Refills: 0    guaiFENesin (ROBITUSSIN) 100 MG/5ML SOLN Take 5 mLs (100 mg total) by mouth every 4 (four) hours as needed for cough or to loosen phlegm. Qty: 118 mL, Refills: 0    senna-docusate (SENOKOT-S) 8.6-50 MG tablet Take 1 tablet by mouth at bedtime as needed for mild constipation.      STOP taking these medications     metFORMIN (GLUCOPHAGE-XR) 500 MG 24 hr tablet      metoprolol succinate (TOPROL-XL) 100 MG 24 hr tablet      levofloxacin (LEVAQUIN) 250 MG tablet      metoprolol tartrate (LOPRESSOR) 25 MG tablet          DISCHARGE INSTRUCTIONS:    Follow with PMD as needed.  If you experience worsening of your admission symptoms, develop shortness of breath, life threatening emergency, suicidal or homicidal thoughts you must seek medical attention immediately by calling 911 or calling your MD  immediately  if symptoms less severe.  You Must read complete instructions/literature along with all the possible adverse reactions/side effects for all the Medicines you take and that have been prescribed to you. Take any new Medicines after you have completely understood and accept all the possible adverse reactions/side effects.   Please note  You were cared for by a hospitalist during your hospital stay. If you have any questions about your discharge medications or the care you  received while you were in the hospital after you are discharged, you can call the unit and asked to speak with the hospitalist on call if the hospitalist that took care of you is not available. Once you are discharged, your primary care physician will handle any further medical issues. Please note that NO REFILLS for any discharge medications will be authorized once you are discharged, as it is imperative that you return to your primary care physician (or establish a relationship with a primary care physician if you do not have one) for your aftercare needs so that they can reassess your need for medications and monitor your lab values.    Today   CHIEF COMPLAINT:   Chief Complaint  Patient presents with  . Fall    HISTORY OF PRESENT ILLNESS:  Mirca Mynatt  is a 82 y.o. female with a known history of Obesity, COPD, OSA and chronic a-fib now with frequent fall, lethargy, and rib pain. In ER, no rib fractures noted. Was intermittently lethargic with mild elevation of CO2. UA positive for UTI. Ambulating with difficulty. She is now admitted.   VITAL SIGNS:  Blood pressure 121/66, pulse 69, temperature 97.9 F (36.6 C), resp. rate 20, height 5\' 5"  (1.651 m), weight 110.8 kg (244 lb 4.3 oz), SpO2 97 %.  I/O:   Intake/Output Summary (Last 24 hours) at 03/08/16 1437 Last data filed at 03/08/16 0923  Gross per 24 hour  Intake              120 ml  Output             1700 ml  Net            -1580 ml     PHYSICAL EXAMINATION:  GENERAL:  82 y.o.-year-old patient lying in the bed with no acute distress.  EYES: Pupils equal, round, reactive to light and accommodation. No scleral icterus. Extraocular muscles intact.  HEENT: signs of trauma with bluish discoloration around both eyes and forehead, normocephalic. Oropharynx and nasopharynx clear.  NECK:  Supple, no jugular venous distention. No thyroid enlargement, no tenderness.  LUNGS: Normal breath sounds bilaterally, no wheezing, rales,rhonchi or crepitation. No use of accessory muscles of respiration.  CARDIOVASCULAR: S1, S2 normal. No murmurs, rubs, or gallops.  ABDOMEN: Soft, non-tender, non-distended. Bowel sounds present. No organomegaly or mass.  EXTREMITIES: No pedal edema, cyanosis, or clubbing.  NEUROLOGIC: Cranial nerves II through XII are intact. Muscle strength 4/5 in all extremities. Sensation intact. Gait not checked.  PSYCHIATRIC: The patient is alert and oriented x 3.  SKIN: No obvious rash, lesion, or ulcer.   DATA REVIEW:   CBC  Recent Labs Lab 03/08/16 1247  WBC 5.3  HGB 8.5*  HCT 27.7*  PLT 220    Chemistries   Recent Labs Lab 03/07/16 0615 03/08/16 1247  NA 141 141  K 4.2 3.7  CL 109 103  CO2 28 30  GLUCOSE 108* 123*  BUN 27* 21*  CREATININE 1.82* 1.72*  CALCIUM 8.4* 8.4*  AST 20  --   ALT 16  --   ALKPHOS 118  --   BILITOT 0.9  --     Cardiac Enzymes  Recent Labs Lab 03/06/16 0729  TROPONINI <0.03    Microbiology Results  Results for orders placed or performed during the hospital encounter of 03/06/16  Urine culture     Status: Abnormal   Collection Time: 03/06/16  8:06 AM  Result Value Ref Range Status   Specimen Description  URINE, RANDOM  Final   Special Requests NONE  Final   Culture >=100,000 COLONIES/mL ESCHERICHIA COLI (A)  Final   Report Status 03/08/2016 FINAL  Final   Organism ID, Bacteria ESCHERICHIA COLI (A)  Final      Susceptibility   Escherichia coli - MIC*     AMPICILLIN >=32 RESISTANT Resistant     CEFAZOLIN <=4 SENSITIVE Sensitive     CEFTRIAXONE <=1 SENSITIVE Sensitive     CIPROFLOXACIN >=4 RESISTANT Resistant     GENTAMICIN >=16 RESISTANT Resistant     IMIPENEM <=0.25 SENSITIVE Sensitive     NITROFURANTOIN <=16 SENSITIVE Sensitive     TRIMETH/SULFA >=320 RESISTANT Resistant     AMPICILLIN/SULBACTAM >=32 RESISTANT Resistant     PIP/TAZO <=4 SENSITIVE Sensitive     Extended ESBL NEGATIVE Sensitive     * >=100,000 COLONIES/mL ESCHERICHIA COLI  Culture, blood (routine x 2)     Status: None (Preliminary result)   Collection Time: 03/06/16  9:50 AM  Result Value Ref Range Status   Specimen Description BLOOD LEFT ANTECUBITAL  Final   Special Requests BOTTLES DRAWN AEROBIC AND ANAEROBIC  1CC  Final   Culture NO GROWTH 2 DAYS  Final   Report Status PENDING  Incomplete  Culture, blood (routine x 2)     Status: None (Preliminary result)   Collection Time: 03/06/16  9:54 AM  Result Value Ref Range Status   Specimen Description BLOOD RIGHT HAND  Final   Special Requests BOTTLES DRAWN AEROBIC AND ANAEROBIC  1CC  Final   Culture NO GROWTH 2 DAYS  Final   Report Status PENDING  Incomplete    RADIOLOGY:  Dg Chest 1 View  Result Date: 03/08/2016 CLINICAL DATA:  Shortness of breath. EXAM: CHEST 1 VIEW COMPARISON:  Scratched 02/21/2016. FINDINGS: Cardiomegaly with mild pulmonary vascular prominence and bilateral interstitial prominence with small left pleural effusion. Findings suggest mild congestive heart failure. Findings have improved from prior study of 02/21/2016. No pneumothorax . IMPRESSION: Cardiomegaly mild pulmonary vascular prominence and bilateral interstitial prominence. Small bilateral pleural effusions. Findings consistent mild congestive heart failure. Findings have improved from prior study of 02/21/2016. Electronically Signed   By: Maisie Fus  Register   On: 03/08/2016 07:21  US Venous Img Lower Unilateral Left  Result Date:  03/07/2016 CLINICAL DATA:  Swelling. EXAM: LEFT LOWER EXTREMITY VENOUS DOPPLER ULTRASOUND TECHNIQUE: Gray-scale sonography with graded compression, as well as color Doppler and duplex ultrasound were performed to evaluate the lower extremity deep venous systems from the level of the common femoral vein and including the common femoral, femoral, profunda femoral, popliteal and calf veins including the posterior tibial, peroneal and gastrocnemius veins when visible. The superficial great saphenous vein was also interrogated. Spectral Doppler was utilized to evaluate flow at rest and with distal augmentation maneuvers in the common femoral, femoral and popliteal veins. COMPARISON:  None. FINDINGS: Contralateral Common Femoral Vein: Respiratory phasicity is normal and symmetric with the symptomatic side. No evidence of thrombus. Normal compressibility. Common Femoral Vein: No evidence of thrombus. Normal compressibility, respiratory phasicity and response to augmentation. Saphenofemoral Junction: No evidence of thrombus. Normal compressibility and flow on color Doppler imaging. Profunda Femoral Vein: No evidence of thrombus. Normal compressibility and flow on color Doppler imaging. Femoral Vein: No evidence of thrombus. Normal compressibility, respiratory phasicity and response to augmentation. Popliteal Vein: No evidence of thrombus. Normal compressibility, respiratory phasicity and response to augmentation. Calf Veins: No evidence of thrombus. Normal compressibility and flow on color Doppler imaging. Superficial Great  Saphenous Vein: No evidence of thrombus. Normal compressibility and flow on color Doppler imaging. Venous Reflux:  None. Other Findings:  6.5 x 1.6 cm left popliteal fossa Baker cyst. IMPRESSION: 1.  6.5 x 1.6 cm left popliteal fossa Baker's cyst. 2. Exam is otherwise unremarkable.  No DVT. Electronically Signed   By: Maisie Fus  Register   On: 03/07/2016 13:57   EKG:   Orders placed or performed during  the hospital encounter of 03/06/16  . ED EKG  . ED EKG  . EKG 12-Lead  . EKG 12-Lead      Management plans discussed with the patient, family and they are in agreement.  CODE STATUS:     Code Status Orders        Start     Ordered   03/06/16 1349  Do not attempt resuscitation (DNR)  Continuous    Question Answer Comment  In the event of cardiac or respiratory ARREST Do not call a "code blue"   In the event of cardiac or respiratory ARREST Do not perform Intubation, CPR, defibrillation or ACLS   In the event of cardiac or respiratory ARREST Use medication by any route, position, wound care, and other measures to relive pain and suffering. May use oxygen, suction and manual treatment of airway obstruction as needed for comfort.      03/06/16 1348    Code Status History    Date Active Date Inactive Code Status Order ID Comments User Context   03/06/2016  1:33 PM 03/06/2016  1:48 PM Full Code [id number]  Marguarite Arbour, MD Inpatient   02/22/2016  2:31 AM 02/23/2016  3:53 PM Full Code [id number]  Ihor Austin, MD Inpatient   12/31/2015  5:11 PM 01/06/2016  8:08 PM Full Code [id number]  Enedina Finner, MD Inpatient   07/17/2015  5:28 PM 07/20/2015  5:46 PM Full Code [id number]  Katharina Caper, MD Inpatient   06/17/2015  5:27 PM 06/19/2015  5:50 PM Full Code [id number]  Tarry Kos, MD Inpatient   04/06/2015 11:36 AM 04/14/2015  7:29 PM Full Code [id number]  Delfino Lovett, MD Inpatient   03/16/2015 11:41 PM 03/20/2015  5:03 PM Full Code [id number]  Ramonita Lab, MD Inpatient      TOTAL TIME TAKING CARE OF THIS PATIENT: 35 minutes.    Altamese Dilling M.D on 03/08/2016 at 2:37 PM  Between 7am to 6pm - Pager - [phone]  After 6pm go to [email or web address] - password [email or web address]  [facility]  Office  [phone]  CC: Primary care physician; Mickey Farber, MD   Note: This dictation was prepared with Dragon dictation along with smaller phrase technology. Any transcriptional errors  that result from this process are unintentional.

## 2016-03-08 NOTE — Progress Notes (Signed)
[name]     Assessment and Plan:  A:Acute respiratory failure, hypoxic due to acute pulmonary edema, volume overload. -IV Lasix given, appears improved.   COPD--does not appear to be in exacerbation at this time. -Continue Anoro once daily. --Continue to wean down oxygen and advance activity as tolerated.   Confusion/delirium- possible sundowning.  --Continue seroquel qhs, advance dose as tolerated.   Symptom/signs of OSA.  --Outpatient sleep study when stable.    Date: 03/08/2016  MRN# [id number] Allison Shepard May 12, 1934   Allison Shepard is a 82 y.o. old female seen in follow up for chief complaint of  Chief Complaint  Patient presents with  . Fall     HPI:   No new complaints today, appears confused.   Medication:   Reviewed.   Allergies:  Hydrocodone-chlorpheniramine; Hydromorphone hcl; Ibuprofen; Mucinex [guaifenesin er]; and Percocet [oxycodone-acetaminophen]  Review of Systems: Gen:  Denies  fever, sweats. HEENT: Denies blurred vision. Cvc:  No dizziness, chest pain or heaviness Resp:   Denies cough or sputum porduction. Gi: Denies swallowing difficulty, stomach pain. constipation, bowel incontinence Gu:  Denies bladder incontinence, burning urine Ext:   No Joint pain, stiffness. Skin: No skin rash, easy bruising. Endoc:  No polyuria, polydipsia. Psych: No depression, insomnia. Other:  All other systems were reviewed and found to be negative other than what is mentioned in the HPI.   Physical Examination:   VS: BP 121/66 (BP Location: Left Arm)   Pulse 69   Temp 97.9 F (36.6 C)   Resp 20   Ht  (1.651 m)   Wt 244 lb 4.3 oz (110.8 kg)   SpO2 97%   BMI 40.65 kg/m   General Appearance: No distress  Neuro:without focal findings,  speech normal,  HEENT: PERRLA, EOM intact. Facial contusion/bruising.  Pulmonary: normal breath sounds, No wheezing.   CardiovascularNormal S1,S2.  No m/r/g.   Abdomen: Benign,  Soft, non-tender. Renal:  No costovertebral tenderness  GU:  Not performed at this time. Endoc: No evident thyromegaly, no signs of acromegaly. Skin:   warm, no rash. Extremities: normal, no cyanosis, clubbing.   LABORATORY PANEL:   CBC  Recent Labs Lab 03/07/16 0615  WBC 5.8  HGB 7.9*  HCT 25.7*  PLT 206   ------------------------------------------------------------------------------------------------------------------  Chemistries   Recent Labs Lab 03/07/16 0615  NA 141  K 4.2  CL 109  CO2 28  GLUCOSE 108*  BUN 27*  CREATININE 1.82*  CALCIUM 8.4*  AST 20  ALT 16  ALKPHOS 118  BILITOT 0.9   ------------------------------------------------------------------------------------------------------------------  Cardiac Enzymes  Recent Labs Lab 03/06/16 0729  TROPONINI <0.03   ------------------------------------------------------------  RADIOLOGY:   No results found for this or any previous visit. Results for orders placed during the hospital encounter of 12/31/15  DG Chest 2 View   Narrative CLINICAL DATA:  82 year old female with a history of aspiration.  EXAM: CHEST  2 VIEW  COMPARISON:  12/31/2015  FINDINGS: Cardiomediastinal silhouette unchanged with cardiomegaly. Calcifications of the aortic arch.  Dense opacity at the base of the lungs, most pronounced on the lateral view.  No pneumothorax.  IMPRESSION: Dense basilar opacity, compatible with aspiration/pneumonitis. Followup PA and lateral chest X-ray is recommended in 3-4 weeks following therapy to ensure resolution.  Signed,  Yvone Neu. Loreta Ave, DO  [facility]  [facility]   Electronically Signed   By: Gilmer Mor D.O.   On: 01/02/2016 11:55    ------------------------------------------------------------------------------------------------------------------  Thank  you for allowing [facility] to  assist in the care of your patient. Our recommendations are noted above.  Please contact us if we can be of further service.   Wells Guiles, MD.  [facility] Office Number: [phone]  Santiago Glad, M.D.  Stephanie Acre, M.D.  [name], M.D  03/08/2016

## 2016-03-08 NOTE — Progress Notes (Signed)
[facility]  ROUNDING NOTE   Subjective:  Patient transitioned back to floor care. Was agitated last night. This a.m. She appears to be awake and alert and follows commands.    Objective:  Vital signs in last 24 hours:  Temp:  [97.9 F (36.6 C)-98.3 F (36.8 C)] 97.9 F (36.6 C) (07/25 0522) Pulse Rate:  [55-126] 69 (07/25 0522) Resp:  [16-26] 20 (07/25 0522) BP: (104-160)/(59-89) 121/66 (07/25 0522) SpO2:  [93 %-99 %] 97 % (07/25 0522) Weight:  [110.8 kg (244 lb 4.3 oz)] 110.8 kg (244 lb 4.3 oz) (07/25 0500)  Weight change: -7.2 kg (-15 lb 14 oz) Filed Weights   03/06/16 1615 03/07/16 0500 03/08/16 0500  Weight: 113.6 kg (250 lb 7 oz) 116.3 kg (256 lb 8 oz) 110.8 kg (244 lb 4.3 oz)    Intake/Output: I/O last 3 completed shifts: In: 1141 [P.O.:300; I.V.:791; IV Piggyback:50] Out: 2950 [Urine:2950]   Intake/Output this shift:  Total I/O In: 120 [P.O.:120] Out: -   Physical Exam: General: NAD, sitting up in bed  Head: Numerous facial bruises  Eyes: Bilateral periorbital ecchymoses  Neck: Supple, trachea midline  Lungs:  Bilateral crackles normal effort  Heart: irregular  Abdomen:  Soft, nontender, obese  Extremities: 1+ peripheral edema.  Neurologic: Awake, follows simple commands  Skin: No lesions       Basic Metabolic Panel:  Recent Labs Lab 03/06/16 0729 03/07/16 0615  NA 141 141  K 4.2 4.2  CL 105 109  CO2 27 28  GLUCOSE 109* 108*  BUN 32* 27*  CREATININE 2.14* 1.82*  CALCIUM 8.4* 8.4*    Liver Function Tests:  Recent Labs Lab 03/07/16 0615  AST 20  ALT 16  ALKPHOS 118  BILITOT 0.9  PROT 6.1*  ALBUMIN 3.1*   No results for input(s): LIPASE, AMYLASE in the last 168 hours. No results for input(s): AMMONIA in the last 168 hours.  CBC:  Recent Labs Lab 03/06/16 0729 03/07/16 0615  WBC 6.0 5.8  NEUTROABS 4.7  --   HGB 8.9* 7.9*  HCT 28.6* 25.7*  MCV 79.3* 78.7*  PLT 217 206    Cardiac Enzymes:  Recent  Labs Lab 03/06/16 0729  TROPONINI <0.03    BNP: Invalid input(s): POCBNP  CBG:  Recent Labs Lab 03/07/16 1155 03/07/16 1545 03/07/16 2110 03/08/16 0743 03/08/16 1137  GLUCAP 96 111* 105* 88 123*    Microbiology: Results for orders placed or performed during the hospital encounter of 03/06/16  Urine culture     Status: Abnormal   Collection Time: 03/06/16  8:06 AM  Result Value Ref Range Status   Specimen Description URINE, RANDOM  Final   Special Requests NONE  Final   Culture >=100,000 COLONIES/mL ESCHERICHIA COLI (A)  Final   Report Status 03/08/2016 FINAL  Final   Organism ID, Bacteria ESCHERICHIA COLI (A)  Final      Susceptibility   Escherichia coli - MIC*    AMPICILLIN >=32 RESISTANT Resistant     CEFAZOLIN <=4 SENSITIVE Sensitive     CEFTRIAXONE <=1 SENSITIVE Sensitive     CIPROFLOXACIN >=4 RESISTANT Resistant     GENTAMICIN >=16 RESISTANT Resistant     IMIPENEM <=0.25 SENSITIVE Sensitive     NITROFURANTOIN <=16 SENSITIVE Sensitive     TRIMETH/SULFA >=320 RESISTANT Resistant     AMPICILLIN/SULBACTAM >=32 RESISTANT Resistant     PIP/TAZO <=4 SENSITIVE Sensitive     Extended ESBL NEGATIVE Sensitive     * >=100,000 COLONIES/mL ESCHERICHIA  COLI  Culture, blood (routine x 2)     Status: None (Preliminary result)   Collection Time: 03/06/16  9:50 AM  Result Value Ref Range Status   Specimen Description BLOOD LEFT ANTECUBITAL  Final   Special Requests BOTTLES DRAWN AEROBIC AND ANAEROBIC  1CC  Final   Culture NO GROWTH 2 DAYS  Final   Report Status PENDING  Incomplete  Culture, blood (routine x 2)     Status: None (Preliminary result)   Collection Time: 03/06/16  9:54 AM  Result Value Ref Range Status   Specimen Description BLOOD RIGHT HAND  Final   Special Requests BOTTLES DRAWN AEROBIC AND ANAEROBIC  1CC  Final   Culture NO GROWTH 2 DAYS  Final   Report Status PENDING  Incomplete    Coagulation Studies: No results for input(s): LABPROT, INR in the last  72 hours.  Urinalysis:  Recent Labs  03/06/16 0806  COLORURINE YELLOW*  LABSPEC 1.017  PHURINE 5.0  GLUCOSEU NEGATIVE  HGBUR 3+*  BILIRUBINUR NEGATIVE  KETONESUR NEGATIVE  PROTEINUR 30*  NITRITE POSITIVE*  LEUKOCYTESUR NEGATIVE      Imaging: Dg Chest 1 View  Result Date: 03/08/2016 CLINICAL DATA:  Shortness of breath. EXAM: CHEST 1 VIEW COMPARISON:  Scratched 02/21/2016. FINDINGS: Cardiomegaly with mild pulmonary vascular prominence and bilateral interstitial prominence with small left pleural effusion. Findings suggest mild congestive heart failure. Findings have improved from prior study of 02/21/2016. No pneumothorax . IMPRESSION: Cardiomegaly mild pulmonary vascular prominence and bilateral interstitial prominence. Small bilateral pleural effusions. Findings consistent mild congestive heart failure. Findings have improved from prior study of 02/21/2016. Electronically Signed   By: Maisie Fus  Register   On: 03/08/2016 07:21  US Venous Img Lower Unilateral Left  Result Date: 03/07/2016 CLINICAL DATA:  Swelling. EXAM: LEFT LOWER EXTREMITY VENOUS DOPPLER ULTRASOUND TECHNIQUE: Gray-scale sonography with graded compression, as well as color Doppler and duplex ultrasound were performed to evaluate the lower extremity deep venous systems from the level of the common femoral vein and including the common femoral, femoral, profunda femoral, popliteal and calf veins including the posterior tibial, peroneal and gastrocnemius veins when visible. The superficial great saphenous vein was also interrogated. Spectral Doppler was utilized to evaluate flow at rest and with distal augmentation maneuvers in the common femoral, femoral and popliteal veins. COMPARISON:  None. FINDINGS: Contralateral Common Femoral Vein: Respiratory phasicity is normal and symmetric with the symptomatic side. No evidence of thrombus. Normal compressibility. Common Femoral Vein: No evidence of thrombus. Normal compressibility,  respiratory phasicity and response to augmentation. Saphenofemoral Junction: No evidence of thrombus. Normal compressibility and flow on color Doppler imaging. Profunda Femoral Vein: No evidence of thrombus. Normal compressibility and flow on color Doppler imaging. Femoral Vein: No evidence of thrombus. Normal compressibility, respiratory phasicity and response to augmentation. Popliteal Vein: No evidence of thrombus. Normal compressibility, respiratory phasicity and response to augmentation. Calf Veins: No evidence of thrombus. Normal compressibility and flow on color Doppler imaging. Superficial Great Saphenous Vein: No evidence of thrombus. Normal compressibility and flow on color Doppler imaging. Venous Reflux:  None. Other Findings:  6.5 x 1.6 cm left popliteal fossa Baker cyst. IMPRESSION: 1.  6.5 x 1.6 cm left popliteal fossa Baker's cyst. 2. Exam is otherwise unremarkable.  No DVT. Electronically Signed   By: Maisie Fus  Register   On: 03/07/2016 13:57    Medications:     . antiseptic oral rinse  7 mL Mouth Rinse BID  . aspirin EC  81 mg Oral Daily  .  budesonide (PULMICORT) nebulizer solution  0.25 mg Nebulization BID  . cefTRIAXone (ROCEPHIN)  IV  1 g Intravenous Q24H  . diltiazem  180 mg Oral Daily  . docusate sodium  100 mg Oral BID  . fluticasone  1 spray Each Nare Daily  . heparin  5,000 Units Subcutaneous Q8H  . insulin aspart  0-9 Units Subcutaneous TID WC  . insulin glargine  30 Units Subcutaneous q morning - 10a  . ipratropium-albuterol  3 mL Nebulization BID  . levothyroxine  125 mcg Oral QAC breakfast  . LORazepam  0.25 mg Intramuscular Once  . pantoprazole  40 mg Oral Daily  . potassium chloride SA  20 mEq Oral Daily  . QUEtiapine  25 mg Oral QHS  . simvastatin  10 mg Oral QHS  . umeclidinium-vilanterol  1 puff Inhalation Daily   acetaminophen **OR** acetaminophen, albuterol, benzonatate, bisacodyl, morphine injection, ondansetron **OR** ondansetron (ZOFRAN) IV, traMADol,  ziprasidone  Assessment/ Plan:  [name] is a 82 y.o. white female with atrial fibrillation, hypertension, COPD, osteoarthritis, hyperlipidemia, osteopenia, allergic rhinitis, lumbar spinal stenosis presents with acute exacerbation of congestive heart failure and COPD  1. Chronic Kidney Disease stage IV with proteinuria: Creatinine does fluctuate due to cardiorenal syndrome.  - no new renal function testing today.  Creatinine was 1.8 yesterday.  Continue to periodically monitor renal function over the course of the hospitalization.  2. Acute respiratory failure, likely multifactorial. - patient required therapy with BiPAP yesterday but is off of this now.  Continue to monitor respiratory status.  3. Anemia of chronic kidney disease: we will go ahead and start the patient on Epogen 20,000 units subcutaneous weekly.  LOS: 1 Sahian Kerney 7/25/201712:56 PM

## 2016-03-08 NOTE — Progress Notes (Signed)
Pt stable. IV removed. D/c instructions given and education provided. Signed prescriptions verified and given. Pt states she understands instructions. Pt dressed and escorted out by staff. Driven home by family.  

## 2016-03-08 NOTE — Progress Notes (Signed)
Speech Language Pathology Treatment: Dysphagia  Patient Details Name: Allison Shepard MRN: [id number] DOB: 1933/10/27 Today's Date: 03/08/2016 Time: 0940-1005 SLP Time Calculation (min) (ACUTE ONLY): 25 min  Assessment / Plan / Recommendation Clinical Impression  Pt appears to be tolerating her currently recommended diet of Dysphagia 3 w/ thin liquids following general aspiration precautions. Staff and husband/pt deny any swallowing difficulty at this time. Pt continues to appear SOB w/ any exertion so education was given on the importance of general aspiration precautions including eating/drinking slowly and taking rest breaks when necessary. Pt and Husband agreed. A menu was given in order for pt to choose foods easier for mastication/exertion; foods/options discussed w/ pt. Pt appears to be at her baseline w/ safety w/ oral intake. Recommend continue w/ current diet as ordered w/ monitoring at meals as needed and assist w/ setup at meals; reduce distractions as needed. NSG updated.    HPI HPI: Pt is a 82 y.o. female has a past medical history significant for Obesity, COPD, OSA and chronic a-fib now with frequent fall, lethargy, and rib pain. In ER, no rib fractures noted. Was intermittently lethargic with mild elevation of CO2. UA positive for UTI. Ambulating with difficulty w/ falls recently. Pt is followed by [facility] per Husband. Pt is more alert and appropriate today. She was more confused and agitated at admission. She continues to require verbal cues for follow through at times but is more appropriate and fed self during the evaluation w/ setup assist. NSG stated pt swallowed pills w/ water appropriately earlier. Husband stated pt was due to go to the dentist for dental issues but has been unable to go f/t medical status. Pt's Cognitive status and agitation continue, however, pt is calmer when Husband is present.       SLP Plan  All goals met     Recommendations  Diet  recommendations: Dysphagia 3 (mechanical soft);Thin liquid Liquids provided via: Cup;Straw Medication Administration: Whole meds with puree Supervision: Patient able to self feed;Intermittent supervision to cue for compensatory strategies Compensations: Minimize environmental distractions;Slow rate;Small sips/bites;Follow solids with liquid Postural Changes and/or Swallow Maneuvers: Seated upright 90 degrees;Upright 30-60 min after meal             Oral Care Recommendations: Oral care BID;Staff/trained caregiver to provide oral care Follow up Recommendations: None Plan: All goals met     GO               Orinda Kenner, MS, CCC-SLP  Axl Rodino 03/08/2016, 3:30 PM

## 2016-03-11 ENCOUNTER — Ambulatory Visit (INDEPENDENT_AMBULATORY_CARE_PROVIDER_SITE_OTHER): Payer: Medicare Other | Admitting: Pulmonary Disease

## 2016-03-11 ENCOUNTER — Encounter: Payer: Self-pay | Admitting: Pulmonary Disease

## 2016-03-11 VITALS — BP 118/72 | HR 117 | Wt 240.0 lb

## 2016-03-11 DIAGNOSIS — J449 Chronic obstructive pulmonary disease, unspecified: Secondary | ICD-10-CM

## 2016-03-11 DIAGNOSIS — J9611 Chronic respiratory failure with hypoxia: Secondary | ICD-10-CM

## 2016-03-11 DIAGNOSIS — J9612 Chronic respiratory failure with hypercapnia: Secondary | ICD-10-CM | POA: Diagnosis not present

## 2016-03-11 MED ORDER — FLUTTER DEVI: 0 refills | Status: DC

## 2016-03-11 MED ORDER — UMECLIDINIUM-VILANTEROL 62.5-25 MCG/INH IN AEPB: 1.0000 | INHALATION_SPRAY | Freq: Every day | RESPIRATORY_TRACT | 5 refills | Status: DC

## 2016-03-11 MED ORDER — ALBUTEROL SULFATE HFA 108 (90 BASE) MCG/ACT IN AERS: 2.0000 | INHALATION_SPRAY | RESPIRATORY_TRACT | 2 refills | Status: DC | PRN

## 2016-03-11 NOTE — Patient Instructions (Signed)
Use Anoro inhaler EVERY day (in the morning) Use Ventolin inhaler as needed I have prescribed a flutter valve to be used as needed to help break up secretions Follow up with me in 6 weeks

## 2016-03-13 NOTE — Progress Notes (Signed)
PROBLEMS: COPD Recent pneumococcal PNA Pulmonary hypertension Chronic hypoxemia GERD Chronic AF Severe deconditioning  INTERVAL HISTORY: Hospice referral made 01/22/16 visit. This has not yet been established as she has been hospitalized twice since that time - 07/11 for a fall and 07/23 for UTI and AMS.   SUBJ: She is in a wheelchair. Cognition is intact for the most part but at times she seems a little confused. She has a multitude of complaints. Mostly, she is very frustrated by weakness and debilitation. She reports tremor. She is not using Anoro inhaler consistently. She is wearing O2 approx 24 hrs per day. Her LE edema is improved. She reports rattling cough with difficulty mobilizing secretions. Denies CP, fever, purulent sputum, hemoptysis, LE edema and calf tenderness. .   OBJ: Vitals:   03/11/16 1041  BP: 118/72  Pulse: (!) 117  SpO2: 97%  Weight: 240 lb (108.9 kg)  2 lpm [place]  Alert, mild fine tremor, cognition mostly intact with a few episodes of confusion, no respiratory distress HEENT: extensive partially healed bruising on face JVP cannot be assessed No wheezes IRIR, no M noted Obese, soft, +BS NO LE edema No focal neurological deficits  DATA: Recent CXRs reviewed  IMPRESSION: Acute on chronic respiratory failure with hypoxia and hypercapnia COPD CHF Chronic severe and progressive debilitation   PLAN: - No changes made to her COPD regimen. Encouraged daily use of Anoro - We again discussed Hospice and what I believe is the role that they could play and the benefit that they could provide.  - Use Ventolin inhaler as needed - I have prescribed a flutter valve to be used as needed to help break up secretions - ROV in 6-8 wks   [name], MD PCCM service Mobile [phone] Pager [phone] 03/13/2016

## 2016-03-29 ENCOUNTER — Emergency Department

## 2016-03-29 ENCOUNTER — Emergency Department
Admission: EM | Admit: 2016-03-29 | Discharge: 2016-03-29 | Disposition: A | Discharge status: Home / Self Care | Attending: Emergency Medicine | Admitting: Emergency Medicine

## 2016-03-29 ENCOUNTER — Encounter: Payer: Self-pay | Admitting: Medical Oncology

## 2016-03-29 DIAGNOSIS — N183 Chronic kidney disease, stage 3 (moderate): Secondary | ICD-10-CM | POA: Insufficient documentation

## 2016-03-29 DIAGNOSIS — Y999 Unspecified external cause status: Secondary | ICD-10-CM | POA: Insufficient documentation

## 2016-03-29 DIAGNOSIS — I4891 Unspecified atrial fibrillation: Secondary | ICD-10-CM | POA: Diagnosis not present

## 2016-03-29 DIAGNOSIS — Y939 Activity, unspecified: Secondary | ICD-10-CM | POA: Insufficient documentation

## 2016-03-29 DIAGNOSIS — W19XXXA Unspecified fall, initial encounter: Secondary | ICD-10-CM

## 2016-03-29 DIAGNOSIS — Z7982 Long term (current) use of aspirin: Secondary | ICD-10-CM | POA: Insufficient documentation

## 2016-03-29 DIAGNOSIS — Z794 Long term (current) use of insulin: Secondary | ICD-10-CM | POA: Diagnosis not present

## 2016-03-29 DIAGNOSIS — Z79899 Other long term (current) drug therapy: Secondary | ICD-10-CM | POA: Insufficient documentation

## 2016-03-29 DIAGNOSIS — Z87891 Personal history of nicotine dependence: Secondary | ICD-10-CM | POA: Insufficient documentation

## 2016-03-29 DIAGNOSIS — S0990XA Unspecified injury of head, initial encounter: Secondary | ICD-10-CM | POA: Insufficient documentation

## 2016-03-29 DIAGNOSIS — J45909 Unspecified asthma, uncomplicated: Secondary | ICD-10-CM | POA: Insufficient documentation

## 2016-03-29 DIAGNOSIS — Y92009 Unspecified place in unspecified non-institutional (private) residence as the place of occurrence of the external cause: Secondary | ICD-10-CM | POA: Diagnosis not present

## 2016-03-29 DIAGNOSIS — I130 Hypertensive heart and chronic kidney disease with heart failure and stage 1 through stage 4 chronic kidney disease, or unspecified chronic kidney disease: Secondary | ICD-10-CM | POA: Diagnosis not present

## 2016-03-29 DIAGNOSIS — W06XXXA Fall from bed, initial encounter: Secondary | ICD-10-CM | POA: Diagnosis not present

## 2016-03-29 DIAGNOSIS — I509 Heart failure, unspecified: Secondary | ICD-10-CM | POA: Diagnosis not present

## 2016-03-29 DIAGNOSIS — J449 Chronic obstructive pulmonary disease, unspecified: Secondary | ICD-10-CM | POA: Insufficient documentation

## 2016-03-29 NOTE — ED Notes (Signed)
Pt given phone to call husband 

## 2016-03-29 NOTE — ED Notes (Signed)
Pt placed on bedpan

## 2016-03-29 NOTE — ED Provider Notes (Addendum)
[facility] Emergency Department Provider Note  ____________________________________________  Time seen: Approximately 9:06 AM  I have reviewed the triage vital signs and the nursing notes.   HISTORY  Chief Complaint Fall and Head Injury    HPI Allison Shepard is a 82 y.o. female brought to the ED for fall and head injury. The patient states that she is trying get out of bed when she slipped off the side of the bed and fell forward. She hit her forehead on her walker on the way to the floor. Denies any pain other than in the left forehead. No neck pain. No loss of consciousness. Didn't injure her hips or her hands. She has chronic shortness of breath on continuous supplemental oxygen. This is this is unchanged. No chest pain fevers chills cough. Eating and drinking normally. Baseline exercise tolerance. No vision changes numbness tingling or weakness.     Past Medical History:  Diagnosis Date  . Arthritis   . Asthma   . Atrial fibrillation (HCC)   . CHF (congestive heart failure) (HCC)   . Chronic kidney disease    history of insufficiency  . Complication of anesthesia    difficulty to wake up after an 8 hour back surgery  . COPD (chronic obstructive pulmonary disease) (HCC)   . Diabetes mellitus without complication (HCC)    type 2  . Dysrhythmia    afib  . Edema    legs/feet  . Headache    migraines - years ago  . Hypercholesteremia   . Hypertension   . Pneumonia   . Shortness of breath dyspnea   . Spinal disorder    stenosis  . Wheezing      Patient Active Problem List   Diagnosis Date Noted  . UTI (lower urinary tract infection) 03/06/2016  . Frequent falls 03/06/2016  . COPD (chronic obstructive pulmonary disease) (HCC) 03/06/2016  . CKD (chronic kidney disease) stage 3, GFR 30-59 ml/min 03/06/2016  . Lethargy 03/06/2016  . Dyspnea 02/22/2016  . COPD with exacerbation (HCC)   . CHF (congestive heart failure) (HCC) 12/31/2015   . Acute respiratory failure with hypoxia (HCC) 07/17/2015  . Left upper lobe pneumonia 07/17/2015  . Hemoptysis 07/17/2015  . Atrial fibrillation with RVR (HCC) 07/17/2015  . Hypotension 07/17/2015  . Renal insufficiency 07/17/2015  . Obesity 07/17/2015  . Metabolic encephalopathy 07/17/2015  . Fracture of radius, distal, with ulna, left, closed 06/17/2015  . S/P ORIF (open reduction internal fixation) fracture 06/17/2015  . Diaphragmatic hernia 04/07/2015  . Restrictive lung disease secondary to obesity 04/07/2015  . Pneumonia 04/07/2015  . COPD exacerbation (HCC) 04/07/2015  . Other emphysema (HCC) 04/07/2015  . Morbid obesity (HCC) 04/07/2015  . Debility 04/07/2015  . Sepsis (HCC) 04/06/2015  . Chronic a-fib (HCC) 03/20/2015  . Acute respiratory distress (HCC) 03/16/2015     Past Surgical History:  Procedure Laterality Date  . ABDOMINAL HYSTERECTOMY    . BACK SURGERY    . CATARACT EXTRACTION     cataract surgery ? eye  . CATARACT EXTRACTION W/PHACO Left 07/28/2015   Procedure: CATARACT EXTRACTION PHACO AND INTRAOCULAR LENS PLACEMENT (IOC);  Surgeon: Galen ManilaWilliam Porfilio, MD;  Location: [facility] ORS;  Service: Ophthalmology;  Laterality: Left;  US 2:16   . COLONOSCOPY     x 3  . ESOPHAGOGASTRODUODENOSCOPY (EGD) WITH PROPOFOL N/A 12/24/2015   Procedure: ESOPHAGOGASTRODUODENOSCOPY (EGD) WITH PROPOFOL;  Surgeon: Rachael Feeaniel P Jacobs, MD;  Location: [facility] ENDOSCOPY;  Service: Endoscopy;  Laterality: N/A;  .  FRACTURE SURGERY Right    right arm 20 years ago  . HERNIA REPAIR    . JOINT REPLACEMENT    . OPEN REDUCTION INTERNAL FIXATION (ORIF) DISTAL RADIAL FRACTURE Left 06/17/2015   Procedure: OPEN REDUCTION INTERNAL FIXATION LEFT  DISTAL RADIUS AND ULNA FRACTURES;  Surgeon: Tarry KosNaiping M Xu, MD;  Location: [facility] OR;  Service: Orthopedics;  Laterality: Left;  . ORIF DISTAL RADIUS FRACTURE Left 06/17/2015  . SHOULDER FUSION SURGERY Right   . TOTAL HIP ARTHROPLASTY Right    hip replacement  . TOTAL  KNEE ARTHROPLASTY Right    knee replacement     Prior to Admission medications   Medication Sig Start Date End Date Taking? Authorizing Provider  albuterol (PROVENTIL HFA;VENTOLIN HFA) 108 (90 Base) MCG/ACT inhaler Inhale 2 puffs into the lungs every 4 (four) hours as needed for wheezing or shortness of breath. 03/11/16  Yes Merwyn Katosavid B Simonds, MD  aspirin EC 81 MG tablet Take 81 mg by mouth daily.   Yes Historical Provider, MD  calcium carbonate (TUMS EX) 750 MG chewable tablet Chew 1 tablet by mouth 2 (two) times daily with a meal.    Yes Historical Provider, MD  Cholecalciferol (VITAMIN D-3) 1000 UNITS CAPS Take 2,000 Units by mouth daily.    Yes Historical Provider, MD  [employer] STOOL SOFTENER 100 MG capsule Take 100 mg by mouth at bedtime. 02/04/16  Yes Historical Provider, MD  diltiazem (CARDIZEM CD) 180 MG 24 hr capsule Take 180 mg by mouth daily.  09/10/15  Yes Historical Provider, MD  fluticasone (FLONASE) 50 MCG/ACT nasal spray Place 1 spray into both nostrils daily.   Yes Historical Provider, MD  furosemide (LASIX) 40 MG tablet Take 40 mg by mouth every morning.   Yes Historical Provider, MD  guaiFENesin (ROBITUSSIN) 100 MG/5ML SOLN Take 5 mLs (100 mg total) by mouth every 4 (four) hours as needed for cough or to loosen phlegm. 01/06/16  Yes Shaune PollackQing Chen, MD  hydrocortisone valerate cream (WESTCORT) 0.2 % Apply 1 application topically 2 (two) times daily as needed. For rash.   Yes Historical Provider, MD  insulin aspart (NOVOLOG) 100 UNIT/ML injection Inject 8-12 Units into the skin 3 (three) times daily with meals. Below 200=8 units and every 50 units above she gets two extra units up to 12 units.   Yes Historical Provider, MD  insulin glargine (LANTUS) 100 UNIT/ML injection Inject 0.3 mLs (30 Units total) into the skin every morning. 01/06/16  Yes Shaune PollackQing Chen, MD  levothyroxine (SYNTHROID, LEVOTHROID) 125 MCG tablet Take 125 mcg by mouth daily before breakfast.   Yes Historical Provider, MD   omeprazole (PRILOSEC) 20 MG capsule Take 20 mg by mouth daily.   Yes Historical Provider, MD  potassium chloride SA (K-DUR,KLOR-CON) 20 MEQ tablet Take 20 mEq by mouth daily.   Yes Historical Provider, MD  senna-docusate (SENOKOT-S) 8.6-50 MG tablet Take 1 tablet by mouth at bedtime as needed for mild constipation. 01/06/16  Yes Shaune PollackQing Chen, MD  simvastatin (ZOCOR) 10 MG tablet Take 10 mg by mouth at bedtime.    Yes Historical Provider, MD  traMADol (ULTRAM) 50 MG tablet Take 50 mg by mouth every 6 (six) hours as needed for moderate pain or severe pain.   Yes Historical Provider, MD  umeclidinium-vilanterol (ANORO ELLIPTA) 62.5-25 MCG/INH AEPB Inhale 1 puff into the lungs daily. 03/11/16  Yes Merwyn Katosavid B Simonds, MD  zolpidem (AMBIEN) 5 MG tablet Take 5 mg by mouth at bedtime as needed for sleep. Reported on  09/08/2015   Yes Historical Provider, MD  Respiratory Therapy Supplies (FLUTTER) DEVI Use 3-4 times daily 03/11/16   Merwyn Katos, MD     Allergies Hydrocodone-chlorpheniramine; Hydromorphone hcl; Ibuprofen; Mucinex [guaifenesin er]; and Percocet [oxycodone-acetaminophen]   Family History  Problem Relation Age of Onset  . Heart disease Father     Social History Social History  Substance Use Topics  . Smoking status: Former Smoker    Quit date: 06/15/1992  . Smokeless tobacco: Never Used  . Alcohol use 8.4 oz/week    7 Glasses of wine, 7 Shots of liquor per week     Comment: daily    Review of Systems  Constitutional:   No fever or chills.  ENT:   No sore throat. No rhinorrhea. Cardiovascular:   No chest pain. Respiratory:   Chronic shortness of breath at baseline. Gastrointestinal:   Negative for abdominal pain, vomiting and diarrhea.  Genitourinary:   Negative for dysuria or difficulty urinating. Musculoskeletal:   Negative for focal pain or swelling Neurological:   Positive left frontal headache 10-point ROS otherwise  negative.  ____________________________________________   PHYSICAL EXAM:  VITAL SIGNS: ED Triage Vitals  Enc Vitals Group     BP 03/29/16 0856 109/81     Pulse Rate 03/29/16 0856 100     Resp 03/29/16 0856 (!) 21     Temp 03/29/16 0856 97.6 F (36.4 C)     Temp Source 03/29/16 0856 Oral     SpO2 03/29/16 0856 95 %     Weight 03/29/16 0856 240 lb (108.9 kg)     Height 03/29/16 0856 5\' 5"  (1.651 m)     Head Circumference --      Peak Flow --      Pain Score 03/29/16 0857 0     Pain Loc --      Pain Edu? --      Excl. in GC? --     Vital signs reviewed, nursing assessments reviewed.   Constitutional:   Alert and oriented, in good spirits and making small talk. Well appearing and in no distress. Eyes:   No scleral icterus. No conjunctival pallor. PERRL. EOMI.  No nystagmus. ENT   Head:   Normocephalic with about 3 cm scalp hematoma on the left forehead. Hemostatic, no laceration. Abrasion of the over the last maxilla and nasal bridge, nontender with no deformities. No crepitus. No swelling..   Nose:   No congestion/rhinnorhea. No septal hematoma   Mouth/Throat:   MMM, no pharyngeal erythema. No peritonsillar mass.    Neck:   No stridor. No SubQ emphysema. No meningismus. Full range of motion Hematological/Lymphatic/Immunilogical:   No cervical lymphadenopathy. Cardiovascular:   Atrial fibrillation, rate controlled between 100- 110. Symmetric bilateral radial and DP pulses.  No murmurs.  Respiratory:   Normal respiratory effort without tachypnea nor retractions. Breath sounds are clear and equal bilaterally. No wheezes/rales/rhonchi. Gastrointestinal:   Soft and nontender. Non distended. There is no CVA tenderness.  No rebound, rigidity, or guarding. Genitourinary:   deferred Musculoskeletal:   Nontender with normal range of motion in all extremities. No joint effusions.  No lower extremity tenderness. 1+ edema bilaterally, symmetric. Brawny skin thickening consistent  with stasis disease Neurologic:   Normal speech and language.  CN 2-10 normal. Motor grossly intact. No gross focal neurologic deficits are appreciated.  Skin:    Skin is warm, dry and intact. No rash noted.  No petechiae, purpura, or bullae.  ____________________________________________    LABS (pertinent  positives/negatives) (all labs ordered are listed, but only abnormal results are displayed) Labs Reviewed - No data to display ____________________________________________   EKG  Interpreted by me Atrial fibrillation rate 104, normal axis intervals QRS ST segments and T waves  ____________________________________________    RADIOLOGY  CT head unremarkable CT maxillofacial unremarkable  ____________________________________________   PROCEDURES Procedures  ____________________________________________   INITIAL IMPRESSION / ASSESSMENT AND PLAN / ED COURSE  Pertinent labs & imaging results that were available during my care of the patient were reviewed by me and considered in my medical decision making (see chart for details).  Patient well appearing no acute distress. Presents with a mechanical fall with a scalp hematoma in the left forehead. We'll get a CT of the head and maxillofacial. Low suspicion for any significant spinal injury. No evidence of any underlying disease that may be causing the patient to be weak and more susceptible to following. Skeletal survey is unremarkable, no evidence of any secondary injury such as forearm wrist hand or hip fracture. Low suspicion for pneumonia and urinary tract infection or sepsis. Well-appearing and suitable for outpatient follow-up after CT if no severe findings are revealed.   ----------------------------------------- 11:19 AM on 03/29/2016 -----------------------------------------  CT negative. Symptoms controlled. We'll discharge home follow up with primary care tomorrow.    Clinical Course    ____________________________________________   FINAL CLINICAL IMPRESSION(S) / ED DIAGNOSES  Final diagnoses:  Head injury, initial encounter  Fall at home, initial encounter       Portions of this note were generated with dragon dictation software. Dictation errors may occur despite best attempts at proofreading.    Sharman Cheek, MD 03/29/16 1119    Sharman Cheek, MD 03/29/16 1120

## 2016-03-29 NOTE — ED Notes (Signed)
Husband now at bedside pt repositioned in bed

## 2016-03-29 NOTE — ED Notes (Signed)
Nurse in with pt called to front pt's husband has not arrived yet

## 2016-03-29 NOTE — ED Notes (Signed)
Report given to Alicia, RN

## 2016-03-29 NOTE — ED Notes (Signed)
Pt to CT scan.

## 2016-03-29 NOTE — ED Triage Notes (Signed)
Pt from home via ems with reports that pt "slipped out of bed" and hit the front of her head. Pt has hematoma noted. A/O x 4. Denies other pain.

## 2016-03-29 NOTE — ED Notes (Signed)

## 2016-03-30 LAB — CULTURE, BLOOD (ROUTINE X 2)
Culture: NO GROWTH
Culture: NO GROWTH

## 2016-04-16 ENCOUNTER — Emergency Department

## 2016-04-16 ENCOUNTER — Inpatient Hospital Stay
Admission: EM | Admit: 2016-04-16 | Discharge: 2016-04-18 | DRG: 189 | Disposition: A | Discharge status: Hospice | Attending: Pulmonary Disease | Admitting: Pulmonary Disease

## 2016-04-16 ENCOUNTER — Encounter: Payer: Self-pay | Admitting: Emergency Medicine

## 2016-04-16 DIAGNOSIS — N189 Chronic kidney disease, unspecified: Secondary | ICD-10-CM | POA: Diagnosis present

## 2016-04-16 DIAGNOSIS — D649 Anemia, unspecified: Secondary | ICD-10-CM | POA: Diagnosis present

## 2016-04-16 DIAGNOSIS — Z87891 Personal history of nicotine dependence: Secondary | ICD-10-CM | POA: Diagnosis not present

## 2016-04-16 DIAGNOSIS — Z6841 Body Mass Index (BMI) 40.0 and over, adult: Secondary | ICD-10-CM | POA: Diagnosis not present

## 2016-04-16 DIAGNOSIS — J439 Emphysema, unspecified: Secondary | ICD-10-CM

## 2016-04-16 DIAGNOSIS — R0902 Hypoxemia: Secondary | ICD-10-CM

## 2016-04-16 DIAGNOSIS — E1122 Type 2 diabetes mellitus with diabetic chronic kidney disease: Secondary | ICD-10-CM | POA: Diagnosis present

## 2016-04-16 DIAGNOSIS — Z515 Encounter for palliative care: Secondary | ICD-10-CM | POA: Diagnosis present

## 2016-04-16 DIAGNOSIS — I4821 Permanent atrial fibrillation: Secondary | ICD-10-CM

## 2016-04-16 DIAGNOSIS — I130 Hypertensive heart and chronic kidney disease with heart failure and stage 1 through stage 4 chronic kidney disease, or unspecified chronic kidney disease: Secondary | ICD-10-CM | POA: Diagnosis present

## 2016-04-16 DIAGNOSIS — I482 Chronic atrial fibrillation: Secondary | ICD-10-CM | POA: Diagnosis present

## 2016-04-16 DIAGNOSIS — Z96641 Presence of right artificial hip joint: Secondary | ICD-10-CM | POA: Diagnosis present

## 2016-04-16 DIAGNOSIS — E11649 Type 2 diabetes mellitus with hypoglycemia without coma: Secondary | ICD-10-CM | POA: Diagnosis present

## 2016-04-16 DIAGNOSIS — J441 Chronic obstructive pulmonary disease with (acute) exacerbation: Secondary | ICD-10-CM | POA: Diagnosis present

## 2016-04-16 DIAGNOSIS — Z96651 Presence of right artificial knee joint: Secondary | ICD-10-CM | POA: Diagnosis present

## 2016-04-16 DIAGNOSIS — J9601 Acute respiratory failure with hypoxia: Secondary | ICD-10-CM | POA: Diagnosis not present

## 2016-04-16 DIAGNOSIS — Z9981 Dependence on supplemental oxygen: Secondary | ICD-10-CM

## 2016-04-16 DIAGNOSIS — R410 Disorientation, unspecified: Secondary | ICD-10-CM

## 2016-04-16 DIAGNOSIS — J9600 Acute respiratory failure, unspecified whether with hypoxia or hypercapnia: Secondary | ICD-10-CM | POA: Diagnosis present

## 2016-04-16 DIAGNOSIS — Z8249 Family history of ischemic heart disease and other diseases of the circulatory system: Secondary | ICD-10-CM | POA: Diagnosis not present

## 2016-04-16 DIAGNOSIS — J9602 Acute respiratory failure with hypercapnia: Secondary | ICD-10-CM | POA: Diagnosis not present

## 2016-04-16 DIAGNOSIS — E785 Hyperlipidemia, unspecified: Secondary | ICD-10-CM | POA: Diagnosis present

## 2016-04-16 DIAGNOSIS — E039 Hypothyroidism, unspecified: Secondary | ICD-10-CM | POA: Diagnosis present

## 2016-04-16 DIAGNOSIS — E662 Morbid (severe) obesity with alveolar hypoventilation: Secondary | ICD-10-CM | POA: Diagnosis present

## 2016-04-16 DIAGNOSIS — R4182 Altered mental status, unspecified: Secondary | ICD-10-CM

## 2016-04-16 DIAGNOSIS — Z9181 History of falling: Secondary | ICD-10-CM

## 2016-04-16 DIAGNOSIS — K219 Gastro-esophageal reflux disease without esophagitis: Secondary | ICD-10-CM | POA: Diagnosis present

## 2016-04-16 DIAGNOSIS — E162 Hypoglycemia, unspecified: Secondary | ICD-10-CM | POA: Diagnosis present

## 2016-04-16 DIAGNOSIS — J9621 Acute and chronic respiratory failure with hypoxia: Principal | ICD-10-CM

## 2016-04-16 DIAGNOSIS — I509 Heart failure, unspecified: Secondary | ICD-10-CM | POA: Diagnosis present

## 2016-04-16 DIAGNOSIS — J9622 Acute and chronic respiratory failure with hypercapnia: Secondary | ICD-10-CM | POA: Diagnosis present

## 2016-04-16 LAB — URINALYSIS COMPLETE WITH MICROSCOPIC (ARMC ONLY)
BILIRUBIN URINE: NEGATIVE
Bacteria, UA: NONE SEEN
GLUCOSE, UA: NEGATIVE mg/dL
HGB URINE DIPSTICK: NEGATIVE
KETONES UR: NEGATIVE mg/dL
LEUKOCYTES UA: NEGATIVE
NITRITE: NEGATIVE
Protein, ur: NEGATIVE mg/dL
SPECIFIC GRAVITY, URINE: 1.008 (ref 1.005–1.030)
Squamous Epithelial / LPF: NONE SEEN
WBC, UA: NONE SEEN WBC/hpf (ref 0–5)
pH: 5 (ref 5.0–8.0)

## 2016-04-16 LAB — BRAIN NATRIURETIC PEPTIDE: B Natriuretic Peptide: 708 pg/mL — ABNORMAL HIGH (ref 0.0–100.0)

## 2016-04-16 LAB — BLOOD GAS, ARTERIAL
Acid-Base Excess: 5.3 mmol/L — ABNORMAL HIGH (ref 0.0–2.0)
BICARBONATE: 31.6 mmol/L — AB (ref 20.0–28.0)
DELIVERY SYSTEMS: POSITIVE
EXPIRATORY PAP: 5
FIO2: 0.4
INSPIRATORY PAP: 15
MECHANICAL RATE: 14
O2 Saturation: 96.6 %
PCO2 ART: 56 mmHg — AB (ref 32.0–48.0)
PO2 ART: 90 mmHg (ref 83.0–108.0)
Patient temperature: 37
pH, Arterial: 7.36 (ref 7.350–7.450)

## 2016-04-16 LAB — GLUCOSE, CAPILLARY
GLUCOSE-CAPILLARY: 128 mg/dL — AB (ref 65–99)
Glucose-Capillary: 96 mg/dL (ref 65–99)
Glucose-Capillary: 90 mg/dL (ref 65–99)
Glucose-Capillary: 137 mg/dL — ABNORMAL HIGH (ref 65–99)
Glucose-Capillary: 154 mg/dL — ABNORMAL HIGH (ref 65–99)

## 2016-04-16 LAB — COMPREHENSIVE METABOLIC PANEL
ALK PHOS: 174 U/L — AB (ref 38–126)
ALT: 22 U/L (ref 14–54)
AST: 27 U/L (ref 15–41)
Albumin: 3.1 g/dL — ABNORMAL LOW (ref 3.5–5.0)
Anion gap: 8 (ref 5–15)
BILIRUBIN TOTAL: 1.4 mg/dL — AB (ref 0.3–1.2)
BUN: 34 mg/dL — ABNORMAL HIGH (ref 6–20)
CALCIUM: 8.4 mg/dL — AB (ref 8.9–10.3)
CO2: 28 mmol/L (ref 22–32)
CREATININE: 1.55 mg/dL — AB (ref 0.44–1.00)
Chloride: 108 mmol/L (ref 101–111)
GFR, EST AFRICAN AMERICAN: 36 mL/min — AB (ref >60)
GFR, EST NON AFRICAN AMERICAN: 31 mL/min — AB (ref >60)
Glucose, Bld: 60 mg/dL — ABNORMAL LOW (ref 65–99)
Potassium: 3.7 mmol/L (ref 3.5–5.1)
Sodium: 144 mmol/L (ref 135–145)
TOTAL PROTEIN: 6.3 g/dL — AB (ref 6.5–8.1)

## 2016-04-16 LAB — PROTIME-INR
INR: 1.15
Prothrombin Time: 14.8 seconds (ref 11.4–15.2)

## 2016-04-16 LAB — MRSA PCR SCREENING: MRSA by PCR: NEGATIVE

## 2016-04-16 LAB — CBC WITH DIFFERENTIAL/PLATELET
Basophils Absolute: 0 10*3/uL (ref 0–0.1)
Basophils Relative: 1 %
Eosinophils Absolute: 0.2 10*3/uL (ref 0–0.7)
Eosinophils Relative: 3 %
HEMATOCRIT: 27.2 % — AB (ref 35.0–47.0)
HEMOGLOBIN: 8.3 g/dL — AB (ref 12.0–16.0)
LYMPHS ABS: 0.3 10*3/uL — AB (ref 1.0–3.6)
LYMPHS PCT: 4 %
MCH: 25.7 pg — AB (ref 26.0–34.0)
MCHC: 30.4 g/dL — ABNORMAL LOW (ref 32.0–36.0)
MCV: 84.7 fL (ref 80.0–100.0)
MONOS PCT: 7 %
Monocytes Absolute: 0.5 10*3/uL (ref 0.2–0.9)
NEUTROS PCT: 85 %
Neutro Abs: 5.7 10*3/uL (ref 1.4–6.5)
Platelets: 199 10*3/uL (ref 150–440)
RBC: 3.21 MIL/uL — AB (ref 3.80–5.20)
RDW: 24.3 % — ABNORMAL HIGH (ref 11.5–14.5)
WBC: 6.7 10*3/uL (ref 3.6–11.0)

## 2016-04-16 LAB — LACTIC ACID, PLASMA: LACTIC ACID, VENOUS: 0.8 mmol/L (ref 0.5–1.9)

## 2016-04-16 LAB — TROPONIN I

## 2016-04-16 MED ORDER — FUROSEMIDE 10 MG/ML IJ SOLN
40.0000 mg | Freq: Once | INTRAMUSCULAR | Status: AC
  Administered 2016-04-16: 40 mg via INTRAVENOUS
  Filled 2016-04-16: qty 4

## 2016-04-16 MED ORDER — BUDESONIDE 0.25 MG/2ML IN SUSP: 0.2500 mg | Freq: Four times a day (QID) | RESPIRATORY_TRACT | Status: DC

## 2016-04-16 MED ORDER — POTASSIUM CHLORIDE CRYS ER 20 MEQ PO TBCR: 40.0000 meq | EXTENDED_RELEASE_TABLET | Freq: Once | ORAL | Status: DC

## 2016-04-16 MED ORDER — SODIUM CHLORIDE 0.9 % IV SOLN: 250.0000 mL | INTRAVENOUS | Status: DC | PRN

## 2016-04-16 MED ORDER — IPRATROPIUM-ALBUTEROL 0.5-2.5 (3) MG/3ML IN SOLN
3.0000 mL | Freq: Once | RESPIRATORY_TRACT | Status: AC
  Administered 2016-04-16: 3 mL via RESPIRATORY_TRACT
  Filled 2016-04-16: qty 3

## 2016-04-16 MED ORDER — LORAZEPAM 0.5 MG PO TABS
0.5000 mg | ORAL_TABLET | Freq: Once | ORAL | Status: DC
Start: 2016-04-16 — End: 2016-04-16

## 2016-04-16 MED ORDER — FENTANYL CITRATE (PF) 100 MCG/2ML IJ SOLN
INTRAMUSCULAR | Status: AC
  Administered 2016-04-16: 11:00:00
  Filled 2016-04-16: qty 2

## 2016-04-16 MED ORDER — ZINC OXIDE 11.3 % EX CREA
TOPICAL_CREAM | Freq: Three times a day (TID) | CUTANEOUS | Status: DC | PRN
  Administered 2016-04-16: 09:00:00 via TOPICAL
  Filled 2016-04-16: qty 56

## 2016-04-16 MED ORDER — LORAZEPAM 2 MG/ML IJ SOLN
0.5000 mg | Freq: Once | INTRAMUSCULAR | Status: AC
  Administered 2016-04-16: 0.5 mg via INTRAVENOUS
  Filled 2016-04-16: qty 1

## 2016-04-16 MED ORDER — FUROSEMIDE 10 MG/ML IJ SOLN
20.0000 mg | Freq: Two times a day (BID) | INTRAMUSCULAR | Status: DC
  Administered 2016-04-16 – 2016-04-17 (×2): 20 mg via INTRAVENOUS
  Filled 2016-04-16 (×2): qty 2

## 2016-04-16 MED ORDER — ASPIRIN EC 81 MG PO TBEC
81.0000 mg | DELAYED_RELEASE_TABLET | Freq: Every day | ORAL | Status: DC
  Administered 2016-04-16 – 2016-04-18 (×3): 81 mg via ORAL
  Filled 2016-04-16 (×3): qty 1

## 2016-04-16 MED ORDER — SENNOSIDES-DOCUSATE SODIUM 8.6-50 MG PO TABS: 1.0000 | ORAL_TABLET | Freq: Every evening | ORAL | Status: DC | PRN

## 2016-04-16 MED ORDER — INSULIN GLARGINE 100 UNIT/ML ~~LOC~~ SOLN
10.0000 [IU] | Freq: Every day | SUBCUTANEOUS | Status: DC
  Administered 2016-04-16 – 2016-04-17 (×2): 10 [IU] via SUBCUTANEOUS
  Filled 2016-04-16 (×3): qty 0.1

## 2016-04-16 MED ORDER — FENTANYL CITRATE (PF) 100 MCG/2ML IJ SOLN: 25.0000 ug | Freq: Once | INTRAMUSCULAR | Status: DC

## 2016-04-16 MED ORDER — LEVOTHYROXINE SODIUM 125 MCG PO TABS
125.0000 ug | ORAL_TABLET | Freq: Every day | ORAL | Status: DC
  Administered 2016-04-16 – 2016-04-18 (×3): 125 ug via ORAL
  Filled 2016-04-16 (×3): qty 1

## 2016-04-16 MED ORDER — HALOPERIDOL LACTATE 5 MG/ML IJ SOLN: 1.0000 mg | INTRAMUSCULAR | Status: DC | PRN

## 2016-04-16 MED ORDER — PANTOPRAZOLE SODIUM 40 MG PO TBEC
40.0000 mg | DELAYED_RELEASE_TABLET | Freq: Every day | ORAL | Status: DC
  Administered 2016-04-16 – 2016-04-18 (×3): 40 mg via ORAL
  Filled 2016-04-16 (×3): qty 1

## 2016-04-16 MED ORDER — BUDESONIDE 0.25 MG/2ML IN SUSP
0.2500 mg | Freq: Two times a day (BID) | RESPIRATORY_TRACT | Status: DC
Start: 2016-04-16 — End: 2016-04-16

## 2016-04-16 MED ORDER — POTASSIUM CHLORIDE CRYS ER 20 MEQ PO TBCR
20.0000 meq | EXTENDED_RELEASE_TABLET | Freq: Every day | ORAL | Status: DC
  Administered 2016-04-16 – 2016-04-18 (×3): 20 meq via ORAL
  Filled 2016-04-16 (×3): qty 1

## 2016-04-16 MED ORDER — HYDROCORTISONE VALERATE 0.2 % EX CREA
1.0000 "application " | TOPICAL_CREAM | Freq: Two times a day (BID) | CUTANEOUS | Status: DC | PRN
  Filled 2016-04-16: qty 15

## 2016-04-16 MED ORDER — INSULIN ASPART 100 UNIT/ML ~~LOC~~ SOLN: 0.0000 [IU] | Freq: Every day | SUBCUTANEOUS | Status: DC

## 2016-04-16 MED ORDER — ONDANSETRON HCL 4 MG/2ML IJ SOLN: 4.0000 mg | Freq: Four times a day (QID) | INTRAMUSCULAR | Status: DC | PRN

## 2016-04-16 MED ORDER — CALCIUM CARBONATE ANTACID 500 MG PO CHEW: 750.0000 mg | CHEWABLE_TABLET | Freq: Two times a day (BID) | ORAL | Status: DC

## 2016-04-16 MED ORDER — HALOPERIDOL LACTATE 5 MG/ML IJ SOLN
1.0000 mg | Freq: Four times a day (QID) | INTRAMUSCULAR | Status: DC | PRN
  Administered 2016-04-16 – 2016-04-18 (×2): 1 mg via INTRAVENOUS
  Filled 2016-04-16 (×3): qty 1

## 2016-04-16 MED ORDER — FUROSEMIDE 40 MG PO TABS: 40.0000 mg | ORAL_TABLET | ORAL | Status: DC

## 2016-04-16 MED ORDER — ACETAMINOPHEN 325 MG PO TABS: 650.0000 mg | ORAL_TABLET | ORAL | Status: DC | PRN

## 2016-04-16 MED ORDER — CHLORHEXIDINE GLUCONATE 0.12 % MT SOLN
15.0000 mL | Freq: Two times a day (BID) | OROMUCOSAL | Status: DC
  Administered 2016-04-16 – 2016-04-18 (×5): 15 mL via OROMUCOSAL
  Filled 2016-04-16 (×5): qty 15

## 2016-04-16 MED ORDER — TRAZODONE HCL 100 MG PO TABS
100.0000 mg | ORAL_TABLET | Freq: Every evening | ORAL | Status: DC | PRN
  Administered 2016-04-16 – 2016-04-17 (×2): 100 mg via ORAL
  Filled 2016-04-16: qty 2
  Filled 2016-04-16: qty 1

## 2016-04-16 MED ORDER — INSULIN ASPART 100 UNIT/ML ~~LOC~~ SOLN
0.0000 [IU] | Freq: Three times a day (TID) | SUBCUTANEOUS | Status: DC
  Administered 2016-04-16 (×2): 2 [IU] via SUBCUTANEOUS
  Administered 2016-04-17: 3 [IU] via SUBCUTANEOUS
  Administered 2016-04-17 (×2): 2 [IU] via SUBCUTANEOUS
  Administered 2016-04-18: 3 [IU] via SUBCUTANEOUS
  Filled 2016-04-16: qty 3
  Filled 2016-04-16 (×2): qty 2
  Filled 2016-04-16 (×2): qty 3
  Filled 2016-04-16 (×2): qty 2

## 2016-04-16 MED ORDER — TRAMADOL HCL 50 MG PO TABS: 50.0000 mg | ORAL_TABLET | Freq: Four times a day (QID) | ORAL | Status: DC | PRN

## 2016-04-16 MED ORDER — BUDESONIDE 0.25 MG/2ML IN SUSP
0.2500 mg | Freq: Four times a day (QID) | RESPIRATORY_TRACT | Status: DC
  Administered 2016-04-16 – 2016-04-18 (×9): 0.25 mg via RESPIRATORY_TRACT
  Filled 2016-04-16 (×9): qty 2

## 2016-04-16 MED ORDER — DEXTROSE 50 % IV SOLN
50.0000 mL | Freq: Once | INTRAVENOUS | Status: AC
  Administered 2016-04-16: 50 mL via INTRAVENOUS

## 2016-04-16 MED ORDER — SIMVASTATIN 10 MG PO TABS: 10.0000 mg | ORAL_TABLET | Freq: Every day | ORAL | Status: DC

## 2016-04-16 MED ORDER — DEXTROSE 50 % IV SOLN
INTRAVENOUS | Status: AC
  Filled 2016-04-16: qty 50

## 2016-04-16 MED ORDER — METHYLPREDNISOLONE SODIUM SUCC 125 MG IJ SOLR
125.0000 mg | Freq: Once | INTRAMUSCULAR | Status: AC
  Administered 2016-04-16: 125 mg via INTRAVENOUS
  Filled 2016-04-16: qty 2

## 2016-04-16 MED ORDER — FLUTICASONE PROPIONATE 50 MCG/ACT NA SUSP
1.0000 | Freq: Every day | NASAL | Status: DC
  Administered 2016-04-17 – 2016-04-18 (×2): 1 via NASAL
  Filled 2016-04-16: qty 16

## 2016-04-16 MED ORDER — POTASSIUM CHLORIDE 20 MEQ/15ML (10%) PO SOLN
40.0000 meq | Freq: Once | ORAL | Status: AC
  Administered 2016-04-16: 40 meq via ORAL
  Filled 2016-04-16: qty 30

## 2016-04-16 MED ORDER — HEPARIN SODIUM (PORCINE) 5000 UNIT/ML IJ SOLN
5000.0000 [IU] | Freq: Three times a day (TID) | INTRAMUSCULAR | Status: DC
  Administered 2016-04-16 – 2016-04-18 (×7): 5000 [IU] via SUBCUTANEOUS
  Filled 2016-04-16 (×7): qty 1

## 2016-04-16 MED ORDER — INSULIN ASPART 100 UNIT/ML ~~LOC~~ SOLN: 0.0000 [IU] | SUBCUTANEOUS | Status: DC

## 2016-04-16 MED ORDER — DILTIAZEM HCL ER COATED BEADS 180 MG PO CP24
180.0000 mg | ORAL_CAPSULE | Freq: Every day | ORAL | Status: DC
  Administered 2016-04-16 – 2016-04-18 (×3): 180 mg via ORAL
  Filled 2016-04-16 (×3): qty 1

## 2016-04-16 MED ORDER — BUDESONIDE 0.25 MG/2ML IN SUSP: 0.2500 mg | Freq: Two times a day (BID) | RESPIRATORY_TRACT | Status: DC

## 2016-04-16 NOTE — ED Notes (Signed)
Pt incontinent of urine. Cleaned and changed pt and bed.

## 2016-04-16 NOTE — Clinical Social Work Note (Signed)
CSW received consult for home medication management. RNCM informed of need. CSW signing off.  Argentina PonderKaren Martha Jovahn Breit, MSW LCSW-A [phone]

## 2016-04-16 NOTE — Progress Notes (Signed)
Patient transported to ICU 9 without complication, remains on Bipap 15/5 40% at this time.  Will continue to monitor.

## 2016-04-16 NOTE — Progress Notes (Signed)
eLink Physician-Brief Progress Note Patient Name: Allison HeadsMargaret M Shepard DOB: 09-02-1933 MRN: [id number]   Date of Service  04/16/2016  HPI/Events of Note  Potassium 3.7. KCl 40mEq CR tablet ordered. Patient having trouble swallowing large pills.   eICU Interventions  Switch to K Elixir 40mEq PO x1     Intervention Category Intermediate Interventions: Electrolyte abnormality - evaluation and management  Lawanda CousinsJennings Brock Larmon 04/16/2016, 5:23 PM

## 2016-04-16 NOTE — ED Triage Notes (Signed)
[facility] pt comes from home with AMS. Husband called stating that pt woke up screaming. Pt has hx of psychiatric disorders. EMS reports BP 124/81 and O2 saturation 80%. Pt is groaning at time of triage and having increased work of breathing at this time.

## 2016-04-16 NOTE — Progress Notes (Signed)
Pt c/o of trouble breathing after taking meds and eating some of her tray. Simonds notified, Fentanyl given and placed back on bipap

## 2016-04-16 NOTE — H&P (Signed)
PULMONARY / CRITICAL CARE MEDICINE   Name: Allison Shepard MRN: [id number] DOB: Apr 02, 1934    ADMISSION DATE:  04/16/2016   REFERRING MD:  Dr. Dolores Frame  CHIEF COMPLAINT:  AMS and Hypoxemia  HISTORY OF PRESENT ILLNESS:   This is a 82 y/o female with a h/o COPD on home O2, multiple recent admissions for exacerbations in the last five months, afib, CHF, CKD, DM, hypertension, and hyperlipidemia who presents with AMS and hypoxemia. Patient remains somnolent and does not recall events that led to ED visit hence history is obtained from ED/EMS records. Patient was sleeping when suddenly she woke up screaming. Husband called EMS. Upon EMS arrival, patient was still confused, screaming and her SPO2 on room air was in the 60s. She was placed on a nonrebreather and her SPO2 increased to 90%. In the ED, she remained somnolent and  Her CXR showed pulmonary edema. She was placed on BiPAP and given lasix. PCCM was called for admission. Patient is more alert and remains on continuous BiPAP. SPO2 now in the high 90s. She reports moderate improvement in dyspnea. She denies chest pain, palpitations, nausea, vomiting, diarrhea but reports pain around her sacrum.  She is on home O2 3L  and multiple inhalers. She reports medication adn O2 compliance. She has a significant psychiatric history and her husband is her caregiver.   PAST MEDICAL HISTORY :  She  has a past medical history of Arthritis; Asthma; Atrial fibrillation (HCC); CHF (congestive heart failure) (HCC); Chronic kidney disease; Complication of anesthesia; COPD (chronic obstructive pulmonary disease) (HCC); Diabetes mellitus without complication (HCC); Dysrhythmia; Edema; Headache; Hypercholesteremia; Hypertension; Pneumonia; Shortness of breath dyspnea; Spinal disorder; and Wheezing.  PAST SURGICAL HISTORY: She  has a past surgical history that includes Abdominal hysterectomy; Shoulder fusion surgery (Right); Total knee arthroplasty (Right); Total hip  arthroplasty (Right); Back surgery; Cataract extraction; Fracture surgery (Right); Colonoscopy; ORIF distal radius fracture (Left, 06/17/2015); Open reduction internal fixation (orif) distal radial fracture (Left, 06/17/2015); Joint replacement; Hernia repair; Cataract extraction w/PHACO (Left, 07/28/2015); and Esophagogastroduodenoscopy (egd) with propofol (N/A, 12/24/2015).  Allergies  Allergen Reactions  . Hydrocodone-Chlorpheniramine Other (See Comments)    Confusion  . Hydromorphone Hcl Nausea And Vomiting  . Ibuprofen Other (See Comments)    Pt was told by her MD not to take this medication.    . Mucinex [Guaifenesin Er] Other (See Comments)    Red all over. Felt like i was on fire  . Percocet [Oxycodone-Acetaminophen] Hives    No current facility-administered medications on file prior to encounter.    Current Outpatient Prescriptions on File Prior to Encounter  Medication Sig  . albuterol (PROVENTIL HFA;VENTOLIN HFA) 108 (90 Base) MCG/ACT inhaler Inhale 2 puffs into the lungs every 4 (four) hours as needed for wheezing or shortness of breath.  Marland Kitchen aspirin EC 81 MG tablet Take 81 mg by mouth daily.  . calcium carbonate (TUMS EX) 750 MG chewable tablet Chew 1 tablet by mouth 2 (two) times daily with a meal.   . Cholecalciferol (VITAMIN D-3) 1000 UNITS CAPS Take 2,000 Units by mouth daily.   . [employer] STOOL SOFTENER 100 MG capsule Take 100 mg by mouth at bedtime.  Marland Kitchen diltiazem (CARDIZEM CD) 180 MG 24 hr capsule Take 180 mg by mouth daily.   . fluticasone (FLONASE) 50 MCG/ACT nasal spray Place 1 spray into both nostrils daily.  . furosemide (LASIX) 40 MG tablet Take 40 mg by mouth every morning.  Marland Kitchen guaiFENesin (ROBITUSSIN) 100 MG/5ML SOLN Take 5  mLs (100 mg total) by mouth every 4 (four) hours as needed for cough or to loosen phlegm.  . hydrocortisone valerate cream (WESTCORT) 0.2 % Apply 1 application topically 2 (two) times daily as needed. For rash.  . insulin aspart (NOVOLOG) 100 UNIT/ML  injection Inject 8-12 Units into the skin 3 (three) times daily with meals. Below 200=8 units and every 50 units above she gets two extra units up to 12 units.  . insulin glargine (LANTUS) 100 UNIT/ML injection Inject 0.3 mLs (30 Units total) into the skin every morning.  Marland Kitchen levothyroxine (SYNTHROID, LEVOTHROID) 125 MCG tablet Take 125 mcg by mouth daily before breakfast.  . omeprazole (PRILOSEC) 20 MG capsule Take 20 mg by mouth daily.  . potassium chloride SA (K-DUR,KLOR-CON) 20 MEQ tablet Take 20 mEq by mouth daily.  Marland Kitchen Respiratory Therapy Supplies (FLUTTER) DEVI Use 3-4 times daily  . senna-docusate (SENOKOT-S) 8.6-50 MG tablet Take 1 tablet by mouth at bedtime as needed for mild constipation.  . simvastatin (ZOCOR) 10 MG tablet Take 10 mg by mouth at bedtime.   . traMADol (ULTRAM) 50 MG tablet Take 50 mg by mouth every 6 (six) hours as needed for moderate pain or severe pain.  Marland Kitchen umeclidinium-vilanterol (ANORO ELLIPTA) 62.5-25 MCG/INH AEPB Inhale 1 puff into the lungs daily.  Marland Kitchen zolpidem (AMBIEN) 5 MG tablet Take 5 mg by mouth at bedtime as needed for sleep. Reported on 09/08/2015    FAMILY HISTORY:  Her indicated that her mother is deceased. She indicated that her father is deceased.    SOCIAL HISTORY: She  reports that she quit smoking about 23 years ago. She has never used smokeless tobacco. She reports that she drinks about 8.4 oz of alcohol per week . She reports that she does not use drugs.  REVIEW OF SYSTEMS:   Constitutional: Negative for fever and chills.  HENT: Negative for congestion and rhinorrhea.  Eyes: Negative for redness and visual disturbance.  Respiratory: Positive for chronic shortness of breath and occasional wheezing.  Cardiovascular: Negative for chest pain and palpitations.  Gastrointestinal: Negative  for nausea , vomiting and abdominal pain and loose stools Genitourinary: Negative for dysuria and urgency.  Endocrine: Denies polyuria, polyphagia and heat  intolerance Musculoskeletal: Negative for myalgias and arthralgias.  Skin: Negative for pallor and wound.  Neurological: Negative for dizziness and headaches   SUBJECTIVE:   VITAL SIGNS: BP 110/85 (BP Location: Left Arm)   Pulse (!) 52   Temp 98.3 F (36.8 C) (Oral)   Resp 17   Ht 5\' 5"  (1.651 m)   Wt 240 lb (108.9 kg)   SpO2 100%   BMI 39.94 kg/m   HEMODYNAMICS:    VENTILATOR SETTINGS: FiO2 (%):  [40 %] 40 %  INTAKE / OUTPUT: No intake/output data recorded.  PHYSICAL EXAMINATION: General:  Obese, mild respiratory distress  Neuro:  AAO X 3, follows commands, moves all extremities, speech is normal, grip strength equal bilaterally HEENT: [place]/AT, PERRLA, conjunctivae pink, neck short, trachea midline Cardiovascular: RRR, S1/S2, no MRG Lungs: Mild increase in WOB, bilateral breath sounds, diminished bilaterally; more in the bases, no wheezing Abdomen: Obse with well healed surgical scar, +bowel sounds, no pain on palpation Musculoskeletal:  +ROM, no visible deformities Extremities: Venous stasis ulceration in lower extremities, +2 pulses bilaterally, +2 edema Skin: Multiple small ulcerations in upper and lower extremities  LABS:  BMET  Recent Labs Lab 04/16/16 0445  NA 144  K 3.7  CL 108  CO2 28  BUN 34*  CREATININE 1.55*  GLUCOSE 60*    Electrolytes  Recent Labs Lab 04/16/16 0445  CALCIUM 8.4*    CBC  Recent Labs Lab 04/16/16 0445  WBC 6.7  HGB 8.3*  HCT 27.2*  PLT 199    Coag's  Recent Labs Lab 04/16/16 0445  INR 1.15    Sepsis Markers  Recent Labs Lab 04/16/16 0445  LATICACIDVEN 0.8    ABG  Recent Labs Lab 04/16/16 0449  PHART 7.36  PCO2ART 56*  PO2ART 90    Liver Enzymes  Recent Labs Lab 04/16/16 0445  AST 27  ALT 22  ALKPHOS 174*  BILITOT 1.4*  ALBUMIN 3.1*    Cardiac Enzymes  Recent Labs Lab 04/16/16 0445  TROPONINI <0.03    Glucose No results for input(s): GLUCAP in the last 168  hours.  Imaging Dg Chest Port 1 View  Result Date: 04/16/2016 CLINICAL DATA:  Initial evaluation for increased shortness of breath. EXAM: PORTABLE CHEST 1 VIEW COMPARISON:  Prior radiograph from 03/08/2016. FINDINGS: Moderate cardiomegaly, stable. Mediastinal silhouette within normal limits. Atheromatous plaque noted within the aortic arch. Lungs are hypoinflated. Diffuse vascular congestion with indistinctness of the interstitial markings, compatible with moderate diffuse pulmonary edema. Probable small bilateral pleural effusions. No definite focal infiltrates, although evaluation somewhat limited on this exam due to shallow lung inflation and technique. No pneumothorax. No acute osseous abnormality. IMPRESSION: 1. Cardiomegaly with moderate diffuse pulmonary edema and small bilateral pleural effusions, most consistent with acute CHF exacerbation. 2. Aortic atherosclerosis. Electronically Signed   By: Rise MuBenjamin  McClintock M.D.   On: 04/16/2016 05:29    STUDIES:  Last echo 02/22/16-LVEF 60-65%, mild MR,   CULTURES: 09/02 Blood cultures x 2  ANTIBIOTICS: None  SIGNIFICANT EVENTS: 09/02-ED with hypoxemia and AMS>Admitted  LINES/TUBES: PIVs  DISCUSSION: 82 yo female presenting acute on chronic respiratory failure and acute pulmonary edema. Likely etiologies include non-adherence and acute CHF exacerbation. Patient has had multiple hospitalizations recently for the same symptoms.   ASSESSMENT / PLAN:  PULMONARY A: Acute on chronic hypoxic respiratory failure-ABG at baseline Acute pulmonary edema  OHS/OSA P:   PRN BiPAP and wean to [place] as tolerated Mandatory nocturnal BiPAP IV lasix Daily CXR Nebulized steroids and bronchodilators   CARDIOVASCULAR A:  Acute CHF exacerbation-likely due to non-adherence with diuretics; last echo showed preserved LVEF H/o Hypertension H/o Hyperlipidemia H/o Afib-Rate controlled; no on anticoagulation P:  IV diuretics Monitor hemodynamics per  icu protocol Continue diltiazem Continue home dose of statin  RENAL A:   CKD-Baseline creatinine is 1.7, now 1.6 P:   Monitor BMP Avoid nephrotoxic drug Diuresis as above Monitor and correct electrolyte abnormalities Foley/foley care for strict Is/Os  GASTROINTESTINAL A:   GERD with chronic PPI use P:   NPO with sips/meds protonix 40mg  po daily  HEMATOLOGIC A:   Chronic anemia-Hemoglobin stable P:  Monitor CBC and transfuse if Hg<7  ENDOCRINE A:   T2DM Hypothyroidism   P:   Continue home dose of synthroid Hold scheduled insulin until oral intake is resumed Blood g;ucose monitoring with sliding scale insulin coverage  NEUROLOGIC A:   AMS-secondary to hypoxia; improved with BiPAP H/o Multiple falls with injury P:   RASS goal: N/A Neur checks Treat hypoxemia     [facility] ANP-BC Pulmonary and Critical Care Medicine [facility] Pager [phone] or [phone] 04/16/2016, 7:33 AM   PCCM ATTENDING: I have reviewed pt's initial presentation, consultants notes and hospital database in detail.  The above assessment and plan was formulated  under my direction.  In summary: I know Allison Shepard well from the office. She has end stage chronic respiratory failure that is multifactorial. More importantly, she has had an inexorable decline in function since her hospitalization last year for pneumococcal PNA. I have recently [facility] for her. She was brought to ED this AM for confusion which appears to be due to hypoxemia and hypoglycemia. She was placed on BiPAP and admitted to the ICU. Her CXR reveals pulmonary edema. She has chronic AF with controlled rate. I have spoken with both her (she remains somewhat confused) and her husband and I have reconfirmed her DNR status. Rest of impression and plan as documented above  [name], MD PCCM service Mobile [phone] Pager [phone] 04/16/2016

## 2016-04-16 NOTE — ED Provider Notes (Signed)
[facility] Emergency Department Provider Note   ____________________________________________   First MD Initiated Contact with Patient 04/16/16 [phone]     (approximate)  I have reviewed the triage vital signs and the nursing notes.   HISTORY  Chief Complaint Altered Mental Status  History limited by decreased LOC  HPI Allison Shepard is a 82 y.o. female brought to the ED from home via EMS with a chief complaint of altered mentation. Husband called 911 reporting that patient woke up screaming. Patient has a history ofCOPD on continuous oxygen therapy, CHF, atrial fibrillation, diabetes who arrives to the ED somnolent with room air oxygenation in the 60s. With nonrebreather oxygen, oxygen saturation increases to the 90s and patient becomes more alert and is able to answer questions appropriately. States she "always wheezes" and has chronic chest pain. Denies recent fever, chills, abdominal pain, nausea, vomiting, diarrhea. Feels her legs and abdomen are more swollen than usual. Denies recent travel or trauma. Nothing makes her symptoms better or worse.   Past Medical History:  Diagnosis Date  . Arthritis   . Asthma   . Atrial fibrillation (HCC)   . CHF (congestive heart failure) (HCC)   . Chronic kidney disease    history of insufficiency  . Complication of anesthesia    difficulty to wake up after an 8 hour back surgery  . COPD (chronic obstructive pulmonary disease) (HCC)   . Diabetes mellitus without complication (HCC)    type 2  . Dysrhythmia    afib  . Edema    legs/feet  . Headache    migraines - years ago  . Hypercholesteremia   . Hypertension   . Pneumonia   . Shortness of breath dyspnea   . Spinal disorder    stenosis  . Wheezing     Patient Active Problem List   Diagnosis Date Noted  . UTI (lower urinary tract infection) 03/06/2016  . Frequent falls 03/06/2016  . COPD (chronic obstructive pulmonary disease) (HCC) 03/06/2016    . CKD (chronic kidney disease) stage 3, GFR 30-59 ml/min 03/06/2016  . Lethargy 03/06/2016  . Dyspnea 02/22/2016  . COPD with exacerbation (HCC)   . CHF (congestive heart failure) (HCC) 12/31/2015  . Acute respiratory failure with hypoxia (HCC) 07/17/2015  . Left upper lobe pneumonia 07/17/2015  . Hemoptysis 07/17/2015  . Atrial fibrillation with RVR (HCC) 07/17/2015  . Hypotension 07/17/2015  . Renal insufficiency 07/17/2015  . Obesity 07/17/2015  . Metabolic encephalopathy 07/17/2015  . Fracture of radius, distal, with ulna, left, closed 06/17/2015  . S/P ORIF (open reduction internal fixation) fracture 06/17/2015  . Diaphragmatic hernia 04/07/2015  . Restrictive lung disease secondary to obesity 04/07/2015  . Pneumonia 04/07/2015  . COPD exacerbation (HCC) 04/07/2015  . Other emphysema (HCC) 04/07/2015  . Morbid obesity (HCC) 04/07/2015  . Debility 04/07/2015  . Sepsis (HCC) 04/06/2015  . Chronic a-fib (HCC) 03/20/2015  . Acute respiratory distress (HCC) 03/16/2015    Past Surgical History:  Procedure Laterality Date  . ABDOMINAL HYSTERECTOMY    . BACK SURGERY    . CATARACT EXTRACTION     cataract surgery ? eye  . CATARACT EXTRACTION W/PHACO Left 07/28/2015   Procedure: CATARACT EXTRACTION PHACO AND INTRAOCULAR LENS PLACEMENT (IOC);  Surgeon: Galen Manila, MD;  Location: [facility] ORS;  Service: Ophthalmology;  Laterality: Left;  [place] 2:16   . COLONOSCOPY     x 3  . ESOPHAGOGASTRODUODENOSCOPY (EGD) WITH PROPOFOL N/A 12/24/2015   Procedure: ESOPHAGOGASTRODUODENOSCOPY (EGD) WITH  PROPOFOL;  Surgeon: [name], MD;  Location: [facility];  Service: Endoscopy;  Laterality: N/A;  . FRACTURE SURGERY Right    right arm 20 years ago  . HERNIA REPAIR    . JOINT REPLACEMENT    . OPEN REDUCTION INTERNAL FIXATION (ORIF) DISTAL RADIAL FRACTURE Left 06/17/2015   Procedure: OPEN REDUCTION INTERNAL FIXATION LEFT  DISTAL RADIUS AND ULNA FRACTURES;  Surgeon: Tarry Kos, MD;   Location: [facility] OR;  Service: Orthopedics;  Laterality: Left;  . ORIF DISTAL RADIUS FRACTURE Left 06/17/2015  . SHOULDER FUSION SURGERY Right   . TOTAL HIP ARTHROPLASTY Right    hip replacement  . TOTAL KNEE ARTHROPLASTY Right    knee replacement    Prior to Admission medications   Medication Sig Start Date End Date Taking? Authorizing Provider  albuterol (PROVENTIL HFA;VENTOLIN HFA) 108 (90 Base) MCG/ACT inhaler Inhale 2 puffs into the lungs every 4 (four) hours as needed for wheezing or shortness of breath. 03/11/16   Merwyn Katos, MD  aspirin EC 81 MG tablet Take 81 mg by mouth daily.    Historical Provider, MD  calcium carbonate (TUMS EX) 750 MG chewable tablet Chew 1 tablet by mouth 2 (two) times daily with a meal.     Historical Provider, MD  Cholecalciferol (VITAMIN D-3) 1000 UNITS CAPS Take 2,000 Units by mouth daily.     Historical Provider, MD  [employer] STOOL SOFTENER 100 MG capsule Take 100 mg by mouth at bedtime. 02/04/16   Historical Provider, MD  diltiazem (CARDIZEM CD) 180 MG 24 hr capsule Take 180 mg by mouth daily.  09/10/15   Historical Provider, MD  fluticasone (FLONASE) 50 MCG/ACT nasal spray Place 1 spray into both nostrils daily.    Historical Provider, MD  furosemide (LASIX) 40 MG tablet Take 40 mg by mouth every morning.    Historical Provider, MD  guaiFENesin (ROBITUSSIN) 100 MG/5ML SOLN Take 5 mLs (100 mg total) by mouth every 4 (four) hours as needed for cough or to loosen phlegm. 01/06/16   Shaune Pollack, MD  hydrocortisone valerate cream (WESTCORT) 0.2 % Apply 1 application topically 2 (two) times daily as needed. For rash.    Historical Provider, MD  insulin aspart (NOVOLOG) 100 UNIT/ML injection Inject 8-12 Units into the skin 3 (three) times daily with meals. Below 200=8 units and every 50 units above she gets two extra units up to 12 units.    Historical Provider, MD  insulin glargine (LANTUS) 100 UNIT/ML injection Inject 0.3 mLs (30 Units total) into the skin every morning.  01/06/16   Shaune Pollack, MD  levothyroxine (SYNTHROID, LEVOTHROID) 125 MCG tablet Take 125 mcg by mouth daily before breakfast.    Historical Provider, MD  omeprazole (PRILOSEC) 20 MG capsule Take 20 mg by mouth daily.    Historical Provider, MD  potassium chloride SA (K-DUR,KLOR-CON) 20 MEQ tablet Take 20 mEq by mouth daily.    Historical Provider, MD  Respiratory Therapy Supplies (FLUTTER) DEVI Use 3-4 times daily 03/11/16   Merwyn Katos, MD  senna-docusate (SENOKOT-S) 8.6-50 MG tablet Take 1 tablet by mouth at bedtime as needed for mild constipation. 01/06/16   Shaune Pollack, MD  simvastatin (ZOCOR) 10 MG tablet Take 10 mg by mouth at bedtime.     Historical Provider, MD  traMADol (ULTRAM) 50 MG tablet Take 50 mg by mouth every 6 (six) hours as needed for moderate pain or severe pain.    Historical Provider, MD  umeclidinium-vilanterol [facility] ELLIPTA) 62.5-25  MCG/INH AEPB Inhale 1 puff into the lungs daily. 03/11/16   Merwyn Katosavid B Simonds, MD  zolpidem (AMBIEN) 5 MG tablet Take 5 mg by mouth at bedtime as needed for sleep. Reported on 09/08/2015    Historical Provider, MD    Allergies Hydrocodone-chlorpheniramine; Hydromorphone hcl; Ibuprofen; Mucinex [guaifenesin er]; and Percocet [oxycodone-acetaminophen]  Family History  Problem Relation Age of Onset  . Heart disease Father     Social History Social History  Substance Use Topics  . Smoking status: Former Smoker    Quit date: 06/15/1992  . Smokeless tobacco: Never Used  . Alcohol use 8.4 oz/week    7 Glasses of wine, 7 Shots of liquor per week     Comment: daily    Review of Systems  Constitutional: No fever/chills. Eyes: No visual changes. ENT: No sore throat. Cardiovascular: Positive for chest pain. Respiratory: Positive for shortness of breath. Gastrointestinal: No abdominal pain.  No nausea, no vomiting.  No diarrhea.  No constipation. Genitourinary: Negative for dysuria. Musculoskeletal: Negative for back pain. Skin: Negative for  rash. Neurological: Negative for headaches, focal weakness or numbness.  10-point ROS otherwise negative.  ____________________________________________   PHYSICAL EXAM:  VITAL SIGNS: ED Triage Vitals  Enc Vitals Group     BP      Pulse      Resp      Temp      Temp src      SpO2      Weight      Height      Head Circumference      Peak Flow      Pain Score      Pain Loc      Pain Edu?      Excl. in GC?     Constitutional: Alert and oriented. Ill-appearing and in moderate acute distress. Eyes: Conjunctivae are normal. PERRL. EOMI. Head: Atraumatic. Nose: No congestion/rhinnorhea. Mouth/Throat: Mucous membranes are moist.  Oropharynx non-erythematous. Neck: No stridor.   Cardiovascular: Normal rate, irregular rhythm. Grossly normal heart sounds.  Good peripheral circulation. Respiratory: Increased respiratory effort.  No retractions. Lungs diminished bilaterally with rales and wheezing. Gastrointestinal: Soft and nontender. No distention. No abdominal bruits. No CVA tenderness. Musculoskeletal: No lower extremity tenderness. 3+ BLE pitting edema.  No joint effusions. Neurologic:  Normal speech and language. No gross focal neurologic deficits are appreciated.  Skin:  Skin is warm, dry and intact. No rash noted. Psychiatric: Mood and affect are normal. Speech and behavior are normal.  ____________________________________________   LABS (all labs ordered are listed, but only abnormal results are displayed)  Labs Reviewed  CBC WITH DIFFERENTIAL/PLATELET - Abnormal; Notable for the following:       Result Value   RBC 3.21 (*)    Hemoglobin 8.3 (*)    HCT 27.2 (*)    MCH 25.7 (*)    MCHC 30.4 (*)    RDW 24.3 (*)    Lymphs Abs 0.3 (*)    All other components within normal limits  COMPREHENSIVE METABOLIC PANEL - Abnormal; Notable for the following:    Glucose, Bld 60 (*)    BUN 34 (*)    Creatinine, Ser 1.55 (*)    Calcium 8.4 (*)    Total Protein 6.3 (*)     Albumin 3.1 (*)    Alkaline Phosphatase 174 (*)    Total Bilirubin 1.4 (*)    GFR calc non Af Amer 31 (*)    GFR calc Af Amer 36 (*)  All other components within normal limits  BLOOD GAS, ARTERIAL - Abnormal; Notable for the following:    pCO2 arterial 56 (*)    Bicarbonate 31.6 (*)    Acid-Base Excess 5.3 (*)    All other components within normal limits  BRAIN NATRIURETIC PEPTIDE - Abnormal; Notable for the following:    B Natriuretic Peptide 708.0 (*)    All other components within normal limits  CULTURE, BLOOD (ROUTINE X 2)  CULTURE, BLOOD (ROUTINE X 2)  TROPONIN I  LACTIC ACID, PLASMA  PROTIME-INR  LACTIC ACID, PLASMA   ____________________________________________  EKG  ED ECG REPORT I, Tayla Panozzo J, the attending physician, personally viewed and interpreted this ECG.   Date: 04/16/2016  EKG Time: 0442  Rate: 106  Rhythm: atrial fibrillation, rate 106  Axis: RAD  Intervals:none  ST&T Change: Nonspecific  ____________________________________________  RADIOLOGY  Chest x-ray (viewed by me, interpreted per Dr. Phill Myron): 1. Cardiomegaly with moderate diffuse pulmonary edema and small  bilateral pleural effusions, most consistent with acute CHF  exacerbation.  2. Aortic atherosclerosis.    ____________________________________________   PROCEDURES  Procedure(s) performed: None  Procedures  Critical Care performed: Yes, see critical care note(s)   CRITICAL CARE Performed by: Irean Hong   Total critical care time: 30 minutes  Critical care time was exclusive of separately billable procedures and treating other patients.  Critical care was necessary to treat or prevent imminent or life-threatening deterioration.  Critical care was time spent personally by me on the following activities: development of treatment plan with patient and/or surrogate as well as nursing, discussions with consultants, evaluation of patient's response to treatment,  examination of patient, obtaining history from patient or surrogate, ordering and performing treatments and interventions, ordering and review of laboratory studies, ordering and review of radiographic studies, pulse oximetry and re-evaluation of patient's condition.  ____________________________________________   INITIAL IMPRESSION / ASSESSMENT AND PLAN / ED COURSE  Pertinent labs & imaging results that were available during my care of the patient were reviewed by me and considered in my medical decision making (see chart for details).  82 year old female with a history of COPD on chronic oxygen, atrial fibrillation, CHF who presents with decreased mentation secondary to acute on chronic respiratory failure. Patient immediately placed on BiPAP with improvement. Laboratory results remarkable for stable anemia, stable renal insufficiency. X-ray consistent with CHF exacerbation. Patient received DuoNeb, Solu-Medrol and now will add Lasix. Will discuss with intensivist to evaluate patient in the emergency department for admission to the stepdown unit on BiPAP.  Clinical Course  Value Comment By Time  Creatinine: (!) 1.55 (Reviewed) Irean Hong, MD 09/02 [phone]   Discussed with healing as well as critical care nurse practitioner who will evaluate patient in the emergency department for admission. Attempted to wean patient from BiPAP but she immediately desaturates to the 70s. Sats immediately increases to the 90s once BiPAP is replaced. Irean Hong, MD 09/02 [phone]     ____________________________________________   FINAL CLINICAL IMPRESSION(S) / ED DIAGNOSES  Final diagnoses:  Altered mental status, unspecified altered mental status type  Hypoglycemia  Hypoxia  Acute on chronic congestive heart failure, unspecified congestive heart failure type (HCC)  Pulmonary emphysema, unspecified emphysema type (HCC)  Permanent atrial fibrillation (HCC)      NEW MEDICATIONS STARTED DURING THIS  VISIT:  New Prescriptions   No medications on file     Note:  This document was prepared using Dragon voice recognition software and may include unintentional dictation errors.  Irean Hong, MD 04/16/16 [phone]

## 2016-04-16 NOTE — Progress Notes (Signed)
Pt arrived from ed on bipap, Simonds in room, removed biapap and placed on 4L [place]

## 2016-04-17 ENCOUNTER — Inpatient Hospital Stay

## 2016-04-17 DIAGNOSIS — J9622 Acute and chronic respiratory failure with hypercapnia: Secondary | ICD-10-CM

## 2016-04-17 LAB — BASIC METABOLIC PANEL
Anion gap: 9 (ref 5–15)
BUN: 37 mg/dL — AB (ref 6–20)
CALCIUM: 8.5 mg/dL — AB (ref 8.9–10.3)
CO2: 26 mmol/L (ref 22–32)
CREATININE: 1.83 mg/dL — AB (ref 0.44–1.00)
Chloride: 107 mmol/L (ref 101–111)
GFR calc non Af Amer: 25 mL/min — ABNORMAL LOW (ref >60)
GFR, EST AFRICAN AMERICAN: 29 mL/min — AB (ref >60)
Glucose, Bld: 152 mg/dL — ABNORMAL HIGH (ref 65–99)
Potassium: 4.5 mmol/L (ref 3.5–5.1)
SODIUM: 142 mmol/L (ref 135–145)

## 2016-04-17 LAB — CBC
HCT: 24.3 % — ABNORMAL LOW (ref 35.0–47.0)
Hemoglobin: 7.5 g/dL — ABNORMAL LOW (ref 12.0–16.0)
MCH: 26.1 pg (ref 26.0–34.0)
MCHC: 30.9 g/dL — AB (ref 32.0–36.0)
MCV: 84.3 fL (ref 80.0–100.0)
Platelets: 181 10*3/uL (ref 150–440)
RBC: 2.88 MIL/uL — ABNORMAL LOW (ref 3.80–5.20)
RDW: 24.6 % — AB (ref 11.5–14.5)
WBC: 5.2 10*3/uL (ref 3.6–11.0)

## 2016-04-17 LAB — GLUCOSE, CAPILLARY
GLUCOSE-CAPILLARY: 128 mg/dL — AB (ref 65–99)
GLUCOSE-CAPILLARY: 148 mg/dL — AB (ref 65–99)
GLUCOSE-CAPILLARY: 153 mg/dL — AB (ref 65–99)
Glucose-Capillary: 188 mg/dL — ABNORMAL HIGH (ref 65–99)

## 2016-04-17 LAB — PHOSPHORUS: PHOSPHORUS: 4.5 mg/dL (ref 2.5–4.6)

## 2016-04-17 LAB — MAGNESIUM: MAGNESIUM: 2.3 mg/dL (ref 1.7–2.4)

## 2016-04-17 LAB — TSH: TSH: 3.724 u[IU]/mL (ref 0.350–4.500)

## 2016-04-17 LAB — TROPONIN I: Troponin I: <0.03 ng/mL (ref <0.03)

## 2016-04-17 MED ORDER — FUROSEMIDE 40 MG PO TABS
40.0000 mg | ORAL_TABLET | Freq: Every day | ORAL | Status: DC
  Administered 2016-04-18: 40 mg via ORAL
  Filled 2016-04-17: qty 1

## 2016-04-17 MED ORDER — SALINE SPRAY 0.65 % NA SOLN
1.0000 | NASAL | Status: DC | PRN
  Administered 2016-04-17: 1 via NASAL
  Filled 2016-04-17: qty 44

## 2016-04-17 NOTE — Progress Notes (Signed)
PULMONARY / CRITICAL CARE MEDICINE   Name: Allison Shepard MRN: [id number] DOB: May 07, 1934    ADMISSION DATE:  04/16/2016   REFERRING MD:  Dr. Dolores FrameSung  CHIEF COMPLAINT:  AMS and Hypoxemia  HISTORY OF PRESENT ILLNESS:   This is a 82 y/o female with a h/o COPD on home O2, multiple recent admissions for exacerbations in the last five months, afib, CHF, CKD, DM, hypertension, and hyperlipidemia who presents with AMS and hypoxemia. Patient remains somnolent and does not recall events that led to ED visit hence history is obtained from ED/EMS records. Patient was sleeping when suddenly she woke up screaming. Husband called EMS. Upon EMS arrival, patient was still confused, screaming and her SPO2 on room air was in the 60s. She was placed on a nonrebreather and her SPO2 increased to 90%. In the ED, she remained somnolent and  Her CXR showed pulmonary edema. She was placed on BiPAP and given lasix. PCCM was called for admission. Patient is more alert and remains on continuous BiPAP. SPO2 now in the high 90s. She reports moderate improvement in dyspnea. She denies chest pain, palpitations, nausea, vomiting, diarrhea but reports pain around her sacrum.  She is on home O2 3L [place] and multiple inhalers. She reports medication adn O2 compliance. She has a significant psychiatric history and her husband is her caregiver.   SUBJECTIVE:  No acute issues; initially refused HS BiPAP; lorazepam x 1 give, wore BiPAP till 4:30am. Denies CP, palpitations, nausea, dizziness and headache. Tolerating [place]  REVIEW OF SYSTEMS:   Constitutional: Negative for fever and chills.  HENT: Negative for congestion and rhinorrhea.  Eyes: Negative for redness and visual disturbance.  Respiratory: Positive for chronic shortness of breath and occasional wheezing.  Cardiovascular: Negative for chest pain and palpitations.  Gastrointestinal: Negative  for nausea , vomiting and abdominal pain and loose stools Genitourinary: Negative for  dysuria and urgency.  Endocrine: Denies polyuria, polyphagia and heat intolerance Musculoskeletal: Negative for myalgias and arthralgias.  Skin: Negative for pallor and wound.  Neurological: Negative for dizziness and headaches   VITAL SIGNS: BP 111/81   Pulse (!) 126   Temp 98.8 F (37.1 C) (Axillary)   Resp 20   Ht 5\' 5"  (1.651 m)   Wt (!) 308 lb 3.3 oz (139.8 kg)   SpO2 95%   BMI 51.29 kg/m   HEMODYNAMICS:    VENTILATOR SETTINGS: FiO2 (%):  [40 %] 40 %  INTAKE / OUTPUT: I/O last 3 completed shifts: In: 370 [P.O.:360; Other:10] Out: 1455 [Urine:1455]  PHYSICAL EXAMINATION: General:  Obese, nad Neuro:  AAO X 3, follows commands, moves all extremities, speech is normal, grip strength equal bilaterally HEENT: [place]/AT, PERRLA, conjunctivae pink, neck short, trachea midline Cardiovascular: RRR, S1/S2, no MRG Lungs: Normal WOB, bilateral breath sounds, diminished bilaterally; more in the bases, no wheezing Abdomen: Obse with well healed surgical scar, +bowel sounds, no pain on palpation Musculoskeletal:  +ROM, no visible deformities Extremities: Venous stasis ulceration in lower extremities, +2 pulses bilaterally, +2 edema Skin: Multiple small ulcerations in upper and lower extremities  LABS:  BMET  Recent Labs Lab 04/16/16 0445 04/17/16 0411  NA 144 142  K 3.7 4.5  CL 108 107  CO2 28 26  BUN 34* 37*  CREATININE 1.55* 1.83*  GLUCOSE 60* 152*    Electrolytes  Recent Labs Lab 04/16/16 0445 04/17/16 0411  CALCIUM 8.4* 8.5*  MG  --  2.3  PHOS  --  4.5    CBC  Recent  Labs Lab 04/16/16 0445 04/17/16 0411  WBC 6.7 5.2  HGB 8.3* 7.5*  HCT 27.2* 24.3*  PLT 199 181    Coag's  Recent Labs Lab 04/16/16 0445  INR 1.15    Sepsis Markers  Recent Labs Lab 04/16/16 0445  LATICACIDVEN 0.8    ABG  Recent Labs Lab 04/16/16 0449  PHART 7.36  PCO2ART 56*  PO2ART 90    Liver Enzymes  Recent Labs Lab 04/16/16 0445  AST 27  ALT 22   ALKPHOS 174*  BILITOT 1.4*  ALBUMIN 3.1*    Cardiac Enzymes  Recent Labs Lab 04/16/16 0445 04/17/16 0411  TROPONINI <0.03 <0.03    Glucose  Recent Labs Lab 04/16/16 0743 04/16/16 0858 04/16/16 1132 04/16/16 1538 04/16/16 2142 04/17/16 0745  GLUCAP 96 90 137* 128* 154* 128*    Imaging No results found.  STUDIES:  Last echo 02/22/16-LVEF 60-65%, mild MR,   CULTURES: 09/02 Blood cultures x 2  ANTIBIOTICS: None  SIGNIFICANT EVENTS: 09/02-ED with hypoxemia and AMS>Admitted  LINES/TUBES: PIVs  DISCUSSION: 82 yo female presenting acute on chronic respiratory failure and acute pulmonary edema. Likely etiologies include non-adherence and acute CHF exacerbation. Patient has had multiple hospitalizations recently for the same symptoms.   ASSESSMENT / PLAN:  PULMONARY A: Acute on chronic hypoxic respiratory failure-ABG at baseline Acute pulmonary edema  OHS/OSA P:   PRN BiPAP and wean to [place] as tolerated Mandatory nocturnal BiPAP Continue IV lasix Daily CXR Nebulized steroids and bronchodilators  CARDIOVASCULAR A:  Acute CHF exacerbation-likely due to non-adherence with diuretics; last echo showed preserved LVEF H/o Hypertension H/o Hyperlipidemia H/o Afib-Rate controlled; no on anticoagulation P:  Resume home dose of lasix Monitor hemodynamics per icu protocol Continue diltiazem Continue home dose of statin Patient will need home care referral for medication management as she doesn't seem to know what medications she's suppose to be taking  RENAL A:   CKD-Baseline creatinine is 1.7, now 1.83 P:   Monitor BMP Avoid nephrotoxic drug Diuresis as above Monitor and correct electrolyte abnormalities Foley/foley care for strict Is/Os  GASTROINTESTINAL A:   GERD with chronic PPI use P:   NPO with sips/meds protonix 40mg  po daily  HEMATOLOGIC A:   Chronic anemia-Hemoglobin stable P:  Monitor CBC and transfuse if Hg<7  ENDOCRINE A:    T2DM Hypothyroidism   P:   Continue home dose of synthroid Hold scheduled insulin until oral intake is resumed Blood g;ucose monitoring with sliding scale insulin coverage  NEUROLOGIC A:   AMS-secondary to hypoxia; improved with BiPAP H/o Multiple falls with injury P:   RASS goal: N/A Neur checks Treat hypoxemia  Transfer to telemetry with nocturnal BiPAP  CCM time=30 minutes  Magdalene S. [facility] ANP-BC Pulmonary and Critical Care Medicine [facility] Pager [phone] or [phone] 04/17/2016, 7:53 AM   PCCM ATTENDING: I have reviewed pt's initial presentation, consultants notes and hospital database in detail.  The above assessment and plan was formulated under my direction.  In summary: She is calmer and better oriented No respiratory distress Oxygen sats are adequate on [place] O2 @ 3 lpm which is her home flow rate AF persists with controlled rate No wheezes on exam Edema pattern persists on CXR  Transfer to telemetry Resume daily furosemide Cont rest as is Anticipate DC home in 24-48 hrs  She already has Hospice arranged  [name], MD PCCM service Mobile [phone] Pager [phone] 04/17/2016

## 2016-04-17 NOTE — Progress Notes (Signed)
Replaced BOTH L arm pink foam pads at this time. Patient removed on own for reasons unknown. Jari FavreSteven M [facility]

## 2016-04-17 NOTE — Progress Notes (Signed)
Patient complaining of nasal congestion, already received her dose of flonase this morning. Paged Dr. Sung AmabileSimonds - call back from Dr. Tilman NeatWerk (covering from [facility]). Ok to order ocean nasal spray PRN. Also received orders to d/c foley today and order PT eval/consult.

## 2016-04-17 NOTE — Progress Notes (Addendum)
[facility] to room. [facility]. Patient resting quietly, RT at bedside setting up bipap for tonight. Patient confused but at her baseline per report. Telemetry verified with Niki NT. VSS. Bed alarm on. Per report, MD is aware of fluctuating heart rate - no interventions at this time; also aware of possibility that patient is aspirating with meals/liquids - no interventions or consults ordered at this time.

## 2016-04-17 NOTE — H&P (Signed)
UOP 20 ml/hr since 2100. NP notified, discussed PMH, assessment, dx lab values, previous meds given.  Will continue to monitor pt closely, lasix scheduled q12.   Ezequiel KayserPaige Beck, RN 04/17/16 @ [phone]

## 2016-04-18 DIAGNOSIS — J9602 Acute respiratory failure with hypercapnia: Secondary | ICD-10-CM

## 2016-04-18 DIAGNOSIS — J9601 Acute respiratory failure with hypoxia: Secondary | ICD-10-CM

## 2016-04-18 LAB — GLUCOSE, CAPILLARY
GLUCOSE-CAPILLARY: 116 mg/dL — AB (ref 65–99)
Glucose-Capillary: 175 mg/dL — ABNORMAL HIGH (ref 65–99)

## 2016-04-18 MED ORDER — MORPHINE SULFATE 20 MG/5ML PO SOLN: 10.0000 mg | ORAL | 0 refills | Status: DC | PRN

## 2016-04-18 MED ORDER — IPRATROPIUM-ALBUTEROL 0.5-2.5 (3) MG/3ML IN SOLN
3.0000 mL | Freq: Four times a day (QID) | RESPIRATORY_TRACT | Status: DC
  Administered 2016-04-18: 3 mL via RESPIRATORY_TRACT
  Filled 2016-04-18: qty 3

## 2016-04-18 MED ORDER — MORPHINE SULFATE (PF) 2 MG/ML IV SOLN: 2.0000 mg | INTRAVENOUS | Status: DC | PRN

## 2016-04-18 NOTE — Progress Notes (Signed)
Patient complaint of SOB. Rt attempted to place on Bipap. However patient said " I am claustrophobic, I can't wear the mask". Placed back on nasal canula

## 2016-04-18 NOTE — Discharge Summary (Signed)
Physician Discharge Summary  Patient ID: Allison Shepard MRN: [id number] DOB/AGE: 05-05-1934 82 y.o.  Admit date: 04/16/2016 Discharge date: 04/18/2016  Admission Diagnoses:  Discharge Diagnoses:  Active Problems:   Acute respiratory failure (HCC) with acute on chronic hypercapnic respiratory failure.  AECOPD Obesity hypoventilation syndrome.   Discharged Condition: serious  Hospital Course:   Pt was admitted on the date above with acute respiratory failure, it was noted that the patient was followed by [facility]. Case with discussed with husband by Dr.Simonds, and it was agreed that the patient would be discharged home with hospice, and made comfort measures only.   Consults: None  Significant Diagnostic Studies:   Treatments: respiratory therapy: HHFM  Discharge Exam: Blood pressure 127/77, pulse 92, temperature 98 F (36.7 C), temperature source Oral, resp. rate 19, height 5\' 5"  (1.651 m), weight 252 lb 14.4 oz (114.7 kg), SpO2 96 %. General appearance: fatigued  Lung decreased air entry bilaterally,  Heart: RRR.  Abd: soft. NT, ND, obese.  Extrem: NAD.   Disposition: 01-Home or Self Care     Signed: Shane Crutchradeep Tyson Masin 04/18/2016, 11:06 AM

## 2016-04-18 NOTE — Care Management (Addendum)
Patient was followed by [facility] but changed agencies according to patient's husband.  Says has be open only a few days.  It is reported that hospice agency has been discussing starting Roxanol at home.  During progression it was reported that patient currently on 6 liters of oxygen and baseline is three liters.  Patient becomes very short of breath when she gets anxious and haldol is not  controling symtoms. Obtained order from attending  for morphine script at discharge.  Physical therapy recommends patient/caregiver may benefit from hoyer lift if there is a need for transfers.  Notified [facility] of anticipated discharge home and faxed discharge summary, physical therapy note and H/P.  Received confirmation of fax. Patient says that her husband will transport home and husband confirmed.

## 2016-04-18 NOTE — Progress Notes (Signed)
Patient has not slept the whole night and repeatedly calling staff to come to the room for assistance. Denied nay acute pain. No respiratory distress noted. Will continue to monitor.

## 2016-04-18 NOTE — Progress Notes (Signed)
Have attempted to put bipap on patient since 1130pm after she received her sleeping meds. Patient remains awake however and cannot stand mask on her face. Patient has refused bipap

## 2016-04-18 NOTE — Evaluation (Signed)
Physical Therapy Evaluation Patient Details Name: Allison HeadsMargaret M Eisel MRN: [id number] DOB: November 03, 1933 Today's Date: 04/18/2016   History of Present Illness  presented to ER secondary to AMS, hypoxemia; admitted with acute/chronic respiratory failure secondary to pulmonary edema, CHF exacerbation requiring BiPAP.  Currently on 6L supplemental O2 via [place].  Clinical Impression  Patient very eager for therex/mobility as tolerated, but unable to participate with activities beyond supine/sit and static sitting balance due to significant desat with very minimal exertion (80% on 6L).  Notably SOB even with talking upon return to supine requiring extended time for recovery to >86-87%.  RN informed/aware.  Visibly fatigued, frequently closing eyes/falling asleep during session (received haldol for anxiety/agitation prior to session).  Session terminated upon return to supine to allow for patient rest period. Would benefit from skilled PT to address above deficits and promote optimal return to PLOF as tolerated; recommend return to home with continued hospice services and PT as appropriate. May benefit from use of mechanical lift for OOB attempts in home environment; care management informed/aware.    Follow Up Recommendations  (home with hospice and PT as tolerated/appropriate)    Equipment Recommendations       Recommendations for Other Services       Precautions / Restrictions Precautions Precautions: Fall Restrictions Weight Bearing Restrictions: No      Mobility  Bed Mobility Overal bed mobility: Needs Assistance;+2 for physical assistance Bed Mobility: Supine to Sit;Sit to Supine     Supine to sit: Max assist Sit to supine: +2 for physical assistance;Total assist      Transfers                 General transfer comment: unable to tolerate due to desat with minimal exertion  Ambulation/Gait             General Gait Details: unable to tolerate due to desat with minimal  exertion  Stairs            Wheelchair Mobility    Modified Rankin (Stroke Patients Only)       Balance Overall balance assessment: Needs assistance Sitting-balance support: No upper extremity supported;Feet supported Sitting balance-Leahy Scale: Poor Sitting balance - Comments: min assist throughout       Standing balance comment: unable to tolerate due to desat with minimal exertion                             Pertinent Vitals/Pain Pain Assessment: No/denies pain    Home Living Family/patient expects to be discharged to:: Private residence Living Arrangements: Spouse/significant other Available Help at Discharge: Family Type of Home: House Home Access: Stairs to enter;Ramped entrance     Home Layout: One level Home Equipment: Wheelchair - [employer]manual;Walker - 2 wheels;Bedside commode;Hospital bed      Prior Function Level of Independence: Needs assistance         Comments: Per husband, reports progressive functional decline over recent weeks/months; now relying primarily on manual WC as primary mobility and requiring assist with all functional transfers/ADLs.  Has home hospice services 1-2 days/week.     Hand Dominance        Extremity/Trunk Assessment   Upper Extremity Assessment: Generalized weakness           Lower Extremity Assessment: Generalized weakness (grossly 3/5 throughout)         Communication   Communication:  (very SOB when talking)  Cognition Arousal/Alertness: Lethargic Behavior During Therapy: Restless  Overall Cognitive Status: Difficult to assess                      General Comments      Exercises Other Exercises Other Exercises: Supine LE therex, 1x10, AROM for muscular strength/endurance with functional activities: ankle pumps, heel slides, hip abduct/adduct and SLR.  Frequent cuing for alertness, as patient frequently closing eyes during all therex (received haldol due to anxiety prior to  session)      Assessment/Plan    PT Assessment Patient needs continued PT services  PT Diagnosis Difficulty walking;Generalized weakness   PT Problem List Decreased strength;Decreased activity tolerance;Decreased balance;Decreased mobility;Cardiopulmonary status limiting activity;Obesity;Decreased skin integrity  PT Treatment Interventions DME instruction;Functional mobility training;Therapeutic activities;Therapeutic exercise;Balance training;Patient/family education   PT Goals (Current goals can be found in the Care Plan section) Acute Rehab PT Goals Patient Stated Goal: "I want to try to walk" PT Goal Formulation: With patient/family Time For Goal Achievement: 05/02/16 Potential to Achieve Goals: Poor Additional Goals Additional Goal #1: Assess and establish goals for OOB activity as appropriate    Frequency Min 2X/week   Barriers to discharge Decreased caregiver support      Co-evaluation               End of Session Equipment Utilized During Treatment: Oxygen Activity Tolerance: Patient limited by fatigue;Treatment limited secondary to medical complications (Comment) (desat with activity) Patient left: in bed;with bed alarm set Nurse Communication: Mobility status (O2 response to activity)         Time: 1610-9604 PT Time Calculation (min) (ACUTE ONLY): 35 min   Charges:   PT Evaluation $PT Eval Moderate Complexity: 1 Procedure PT Treatments $Therapeutic Exercise: 8-22 mins   PT G Codes:        Eshika Reckart H. Manson Passey, PT, DPT, NCS 04/18/16, 11:24 AM [phone]

## 2016-04-18 NOTE — Progress Notes (Signed)
Orders to dc pt to home with hospice. Husband made aware of dc and home O2 tank brought in from home in prep for dc. DC instructions given to pt and husband along with paper script for morphine and portable DNR. IV and tele removed and husband instructed to follow up with hospice and schedule appt with pts PCP. Pt escorted off unit via wheelchair by nursing staff.

## 2016-04-21 LAB — CULTURE, BLOOD (ROUTINE X 2)
CULTURE: NO GROWTH
Culture: NO GROWTH

## 2016-04-22 ENCOUNTER — Ambulatory Visit: Payer: Medicare Other | Admitting: Pulmonary Disease

## 2016-05-15 DEATH — deceased

## 2017-10-22 IMAGING — CT CT HEAD W/O CM
3 of 6 series · 14 of 47 positions shown, 16 images · non-contrast
Comparison: 04/06/2015.  Cervical spine, 12/08/2011.

CLINICAL DATA: Per EMS, Pt, from home, c/o L wrist injury and R
wrist pain after falling this evening. Pain score [DATE]. Deformity
noted to L wrist.

EXAM:
CT HEAD WITHOUT CONTRAST
CT CERVICAL SPINE WITHOUT CONTRAST
TECHNIQUE: Multidetector CT imaging of the head and cervical spine was
performed following the standard protocol without intravenous
contrast. Multiplanar CT image reconstructions of the cervical spine
were also generated.

[Series 8: axial recon · axial · 0.23mm/px · z∈[-345,-222]mm · 8 of 87 slices shown, 10 images]
[im 10/87  brain]
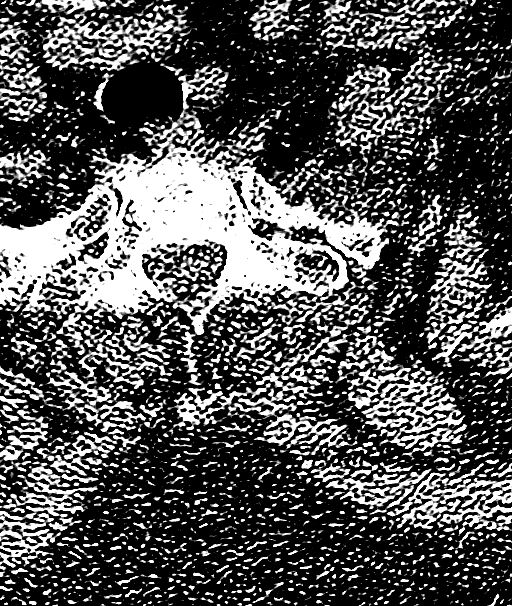
[im 10/87  bone]
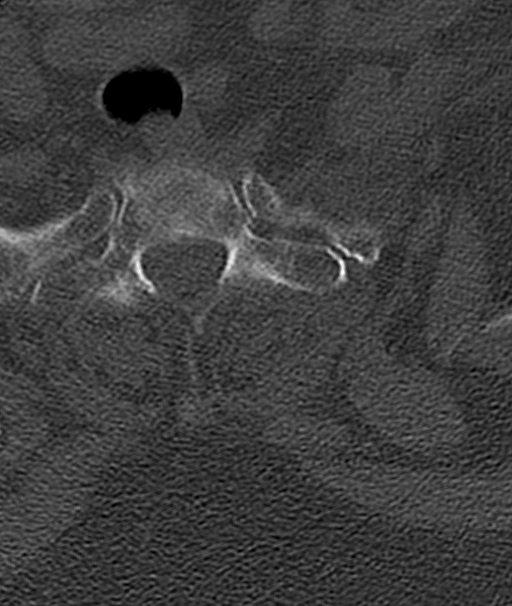
[im 20/87  brain]
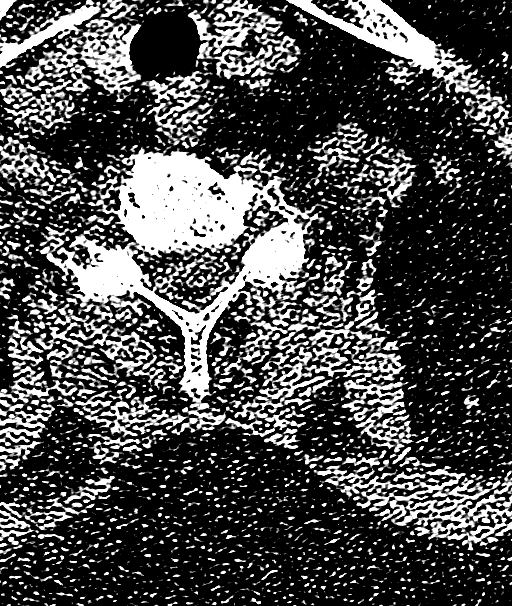
[im 29/87  brain]
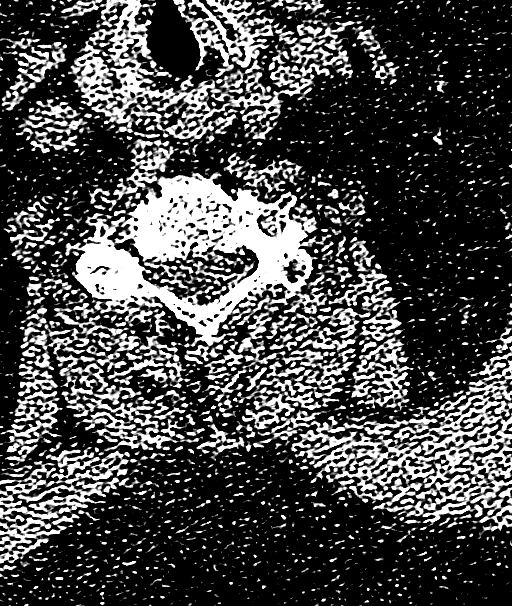
[im 39/87  brain]
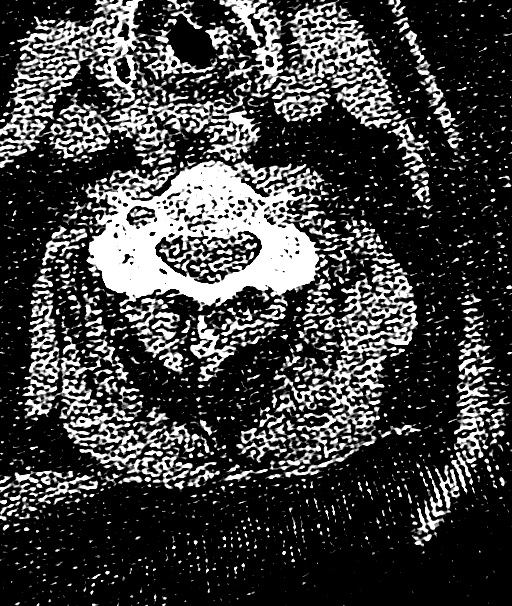
[im 48/87  brain]
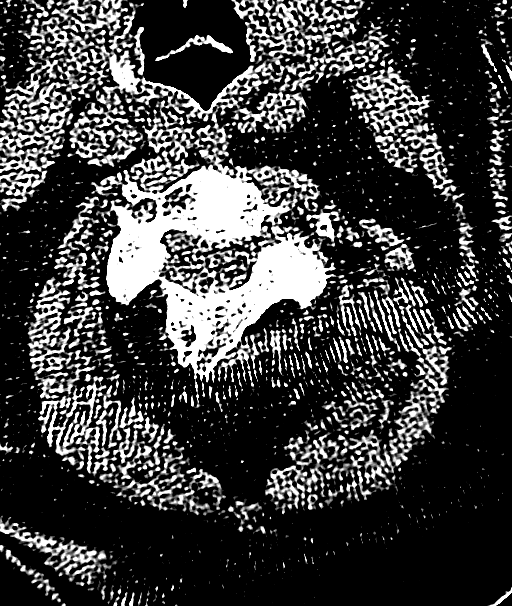
[im 48/87  bone]
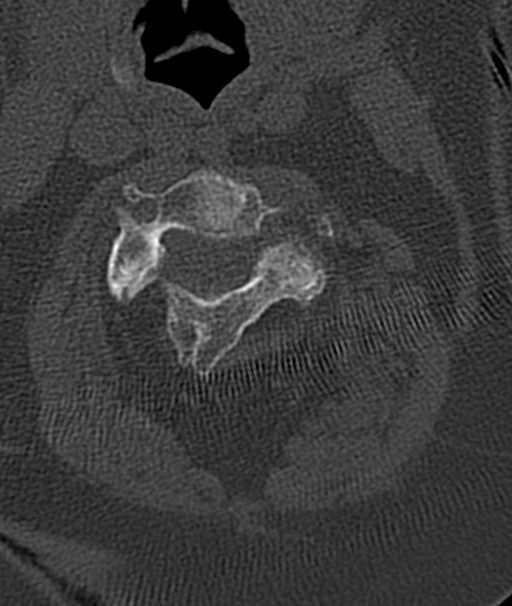
[im 58/87  brain]
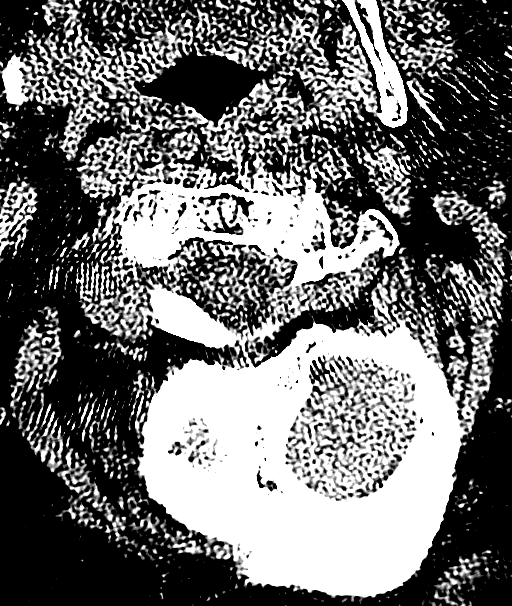
[im 67/87  brain]
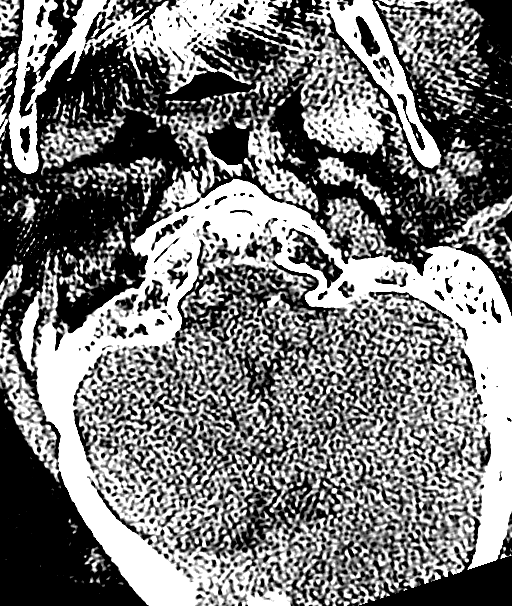
[im 77/87  brain]
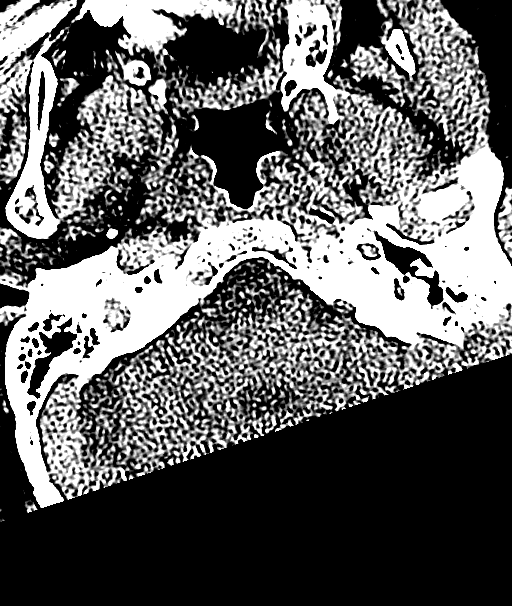

[Series 9: coronal · coronal · 0.25mm/px · 3 of 65 slices shown]
[im 22/65  brain]
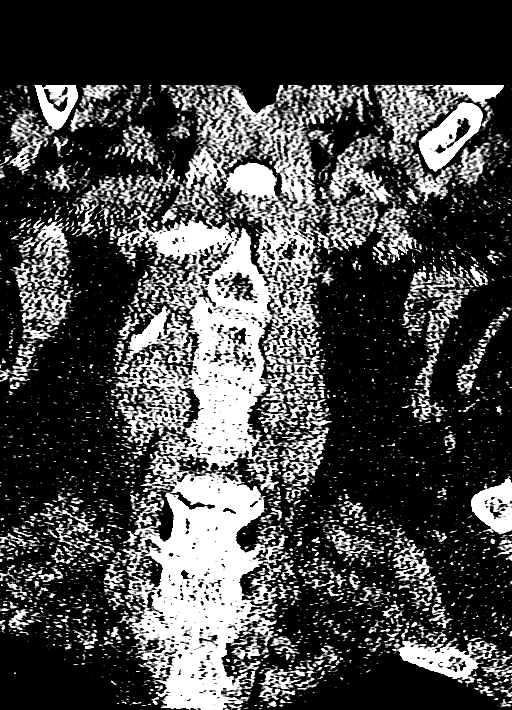
[im 29/65  brain]
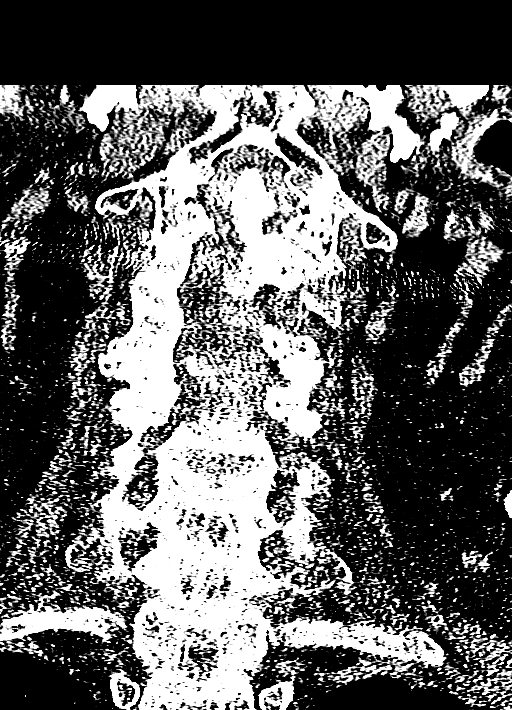
[im 36/65  brain]
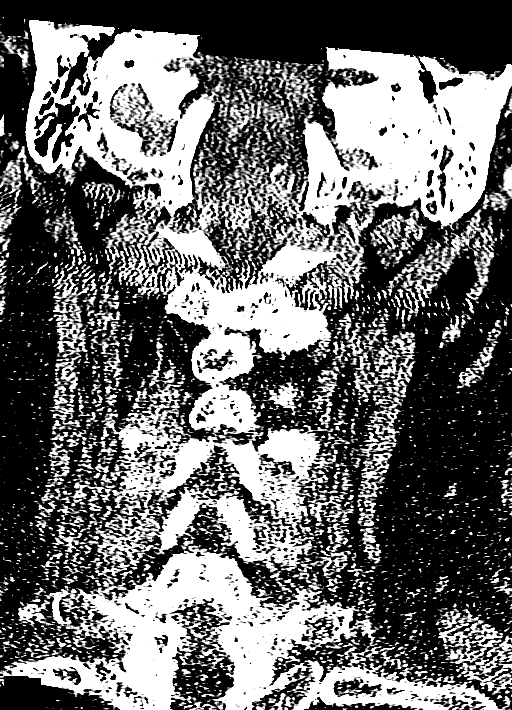

[Series 10: sagittal · sagittal · 0.29mm/px · 3 of 61 slices shown]
[im 21/61  brain]
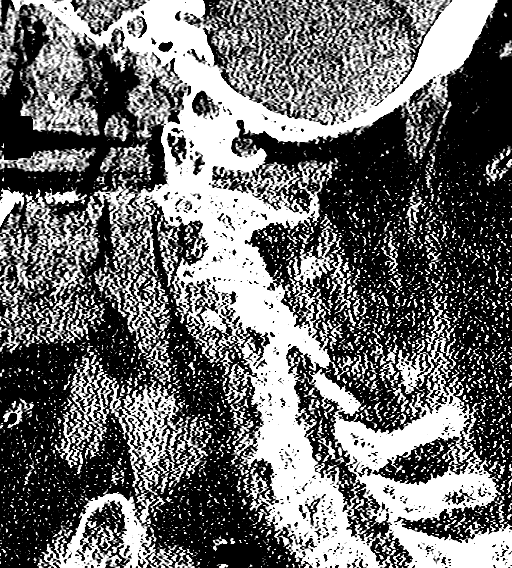
[im 31/61  brain]
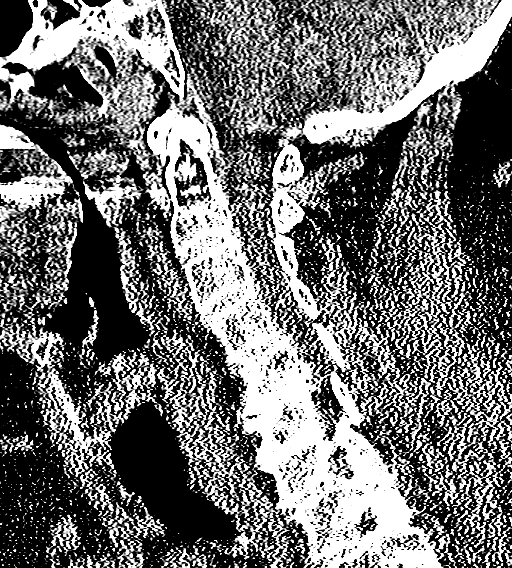
[im 41/61  brain]
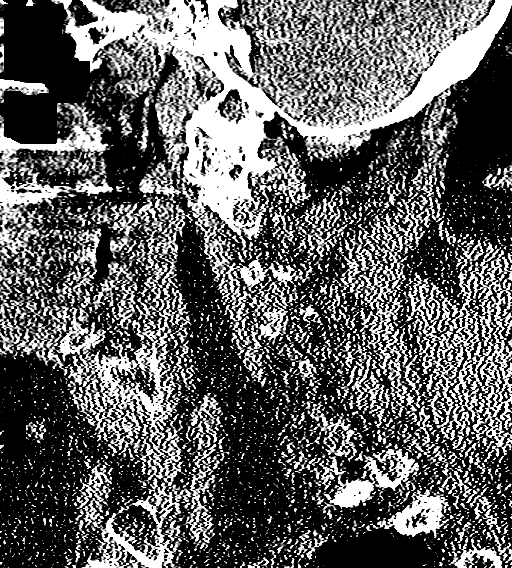

[14 of 47 positions shown; findings below may reference images not displayed]

FINDINGS: CT HEAD FINDINGS

The ventricles normal configuration. There is ventricular and sulcal
enlargement reflecting mild, stable, age related atrophy. No
hydrocephalus.

There are no parenchymal masses or mass effect. There is no evidence
of cortical infarct. Patchy white matter hypoattenuation is noted
consistent with moderate chronic microvascular ischemic change.

There is a widened extra-axial space along the anterior inferior
left temporal lobe, stable, likely an arachnoid cyst. No other
extra-axial abnormalities.

There is no intracranial hemorrhage.

There is fluid attenuation throughout most of the left mastoid air
cells likely a chronic benign effusion. Right mastoid air cells are
clear as are the visualized sinuses. No skull fracture evident.

CT CERVICAL SPINE FINDINGS

No fracture. No spondylolisthesis. Mild reversal of the normal
cervical lordosis with the apex at C5. There is moderate loss disc
height at C5-C6 and C6-C7 where there is endplate spurring. There is
facet degenerative change bilaterally. Neural foraminal narrowing is
noted common most evident on the left at C5-C6, moderate severity.

Bones are diffusely demineralized.

Soft tissue evaluation is unremarkable.  Lung apices are clear.
IMPRESSION: HEAD CT: No acute intracranial abnormalities. Mild atrophy and
moderate chronic microvascular ischemic change.

CERVICAL CT:  No fracture or acute finding.
